# Patient Record
Sex: Female | Born: 1945 | Race: White | Hispanic: No | Smoking: Former smoker
Health system: Southern US, Community
[De-identification: ages and names within clinical notes are randomized; demographics above are authoritative.]

## PROBLEM LIST (undated history)

## (undated) DIAGNOSIS — K589 Irritable bowel syndrome without diarrhea: Secondary | ICD-10-CM

## (undated) DIAGNOSIS — F438 Other reactions to severe stress: Secondary | ICD-10-CM

## (undated) DIAGNOSIS — B350 Tinea barbae and tinea capitis: Secondary | ICD-10-CM

## (undated) DIAGNOSIS — R112 Nausea with vomiting, unspecified: Secondary | ICD-10-CM

## (undated) DIAGNOSIS — G252 Other specified forms of tremor: Secondary | ICD-10-CM

## (undated) DIAGNOSIS — J309 Allergic rhinitis, unspecified: Secondary | ICD-10-CM

## (undated) DIAGNOSIS — M545 Low back pain, unspecified: Secondary | ICD-10-CM

## (undated) DIAGNOSIS — J45909 Unspecified asthma, uncomplicated: Secondary | ICD-10-CM

## (undated) DIAGNOSIS — E782 Mixed hyperlipidemia: Secondary | ICD-10-CM

## (undated) DIAGNOSIS — K760 Fatty (change of) liver, not elsewhere classified: Secondary | ICD-10-CM

## (undated) DIAGNOSIS — J984 Other disorders of lung: Secondary | ICD-10-CM

## (undated) DIAGNOSIS — Z9889 Other specified postprocedural states: Secondary | ICD-10-CM

## (undated) DIAGNOSIS — F4389 Other reactions to severe stress: Secondary | ICD-10-CM

## (undated) DIAGNOSIS — I10 Essential (primary) hypertension: Secondary | ICD-10-CM

## (undated) DIAGNOSIS — E559 Vitamin D deficiency, unspecified: Secondary | ICD-10-CM

## (undated) DIAGNOSIS — G250 Essential tremor: Secondary | ICD-10-CM

## (undated) DIAGNOSIS — K5790 Diverticulosis of intestine, part unspecified, without perforation or abscess without bleeding: Secondary | ICD-10-CM

## (undated) DIAGNOSIS — E119 Type 2 diabetes mellitus without complications: Secondary | ICD-10-CM

## (undated) DIAGNOSIS — K219 Gastro-esophageal reflux disease without esophagitis: Secondary | ICD-10-CM

## (undated) DIAGNOSIS — E041 Nontoxic single thyroid nodule: Secondary | ICD-10-CM

## (undated) HISTORY — DX: Other specified forms of tremor: G25.2

## (undated) HISTORY — PX: TONSILLECTOMY: SUR1361

## (undated) HISTORY — DX: Diverticulosis of intestine, part unspecified, without perforation or abscess without bleeding: K57.90

## (undated) HISTORY — DX: Vitamin D deficiency, unspecified: E55.9

## (undated) HISTORY — PX: ABDOMINAL HYSTERECTOMY: SHX81

## (undated) HISTORY — DX: Unspecified asthma, uncomplicated: J45.909

## (undated) HISTORY — DX: Mixed hyperlipidemia: E78.2

## (undated) HISTORY — DX: Allergic rhinitis, unspecified: J30.9

## (undated) HISTORY — DX: Other reactions to severe stress: F43.89

## (undated) HISTORY — DX: Other disorders of lung: J98.4

## (undated) HISTORY — DX: Essential (primary) hypertension: I10

## (undated) HISTORY — DX: Irritable bowel syndrome, unspecified: K58.9

## (undated) HISTORY — DX: Low back pain, unspecified: M54.50

## (undated) HISTORY — PX: COLONOSCOPY: SHX5424

## (undated) HISTORY — DX: Tinea barbae and tinea capitis: B35.0

## (undated) HISTORY — DX: Essential tremor: G25.0

## (undated) HISTORY — DX: Essential tremor: G25.2

## (undated) HISTORY — DX: Other reactions to severe stress: F43.8

## (undated) HISTORY — DX: Low back pain: M54.5

## (undated) HISTORY — PX: LAPAROSCOPIC UNILATERAL SALPINGO OOPHERECTOMY: SHX5935

---
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
## 1984-05-17 HISTORY — PX: TUBAL LIGATION: SHX77

## 2000-05-17 HISTORY — PX: LAPAROSCOPIC TOTAL HYSTERECTOMY: SUR800

## 2004-05-17 LAB — HM COLONOSCOPY

## 2004-07-14 ENCOUNTER — Encounter: Payer: Self-pay | Admitting: Family Medicine

## 2004-09-23 ENCOUNTER — Ambulatory Visit: Payer: Self-pay | Admitting: Internal Medicine

## 2004-10-20 ENCOUNTER — Ambulatory Visit: Payer: Self-pay | Admitting: Internal Medicine

## 2004-11-13 ENCOUNTER — Encounter (INDEPENDENT_AMBULATORY_CARE_PROVIDER_SITE_OTHER): Payer: Self-pay | Admitting: Specialist

## 2004-11-13 ENCOUNTER — Ambulatory Visit: Payer: Self-pay | Admitting: Internal Medicine

## 2004-12-07 ENCOUNTER — Ambulatory Visit: Payer: Self-pay | Admitting: Internal Medicine

## 2006-11-21 ENCOUNTER — Ambulatory Visit: Payer: Self-pay | Admitting: Family Medicine

## 2006-12-05 ENCOUNTER — Ambulatory Visit: Payer: Self-pay | Admitting: Family Medicine

## 2006-12-19 ENCOUNTER — Encounter: Payer: Self-pay | Admitting: Family Medicine

## 2006-12-21 ENCOUNTER — Inpatient Hospital Stay: Payer: Self-pay | Admitting: Internal Medicine

## 2006-12-21 ENCOUNTER — Encounter: Payer: Self-pay | Admitting: Family Medicine

## 2007-02-06 ENCOUNTER — Ambulatory Visit: Payer: Self-pay | Admitting: Family Medicine

## 2007-02-06 DIAGNOSIS — E782 Mixed hyperlipidemia: Secondary | ICD-10-CM | POA: Insufficient documentation

## 2007-02-06 DIAGNOSIS — J309 Allergic rhinitis, unspecified: Secondary | ICD-10-CM | POA: Insufficient documentation

## 2007-02-06 DIAGNOSIS — M545 Low back pain, unspecified: Secondary | ICD-10-CM | POA: Insufficient documentation

## 2007-02-06 DIAGNOSIS — J4530 Mild persistent asthma, uncomplicated: Secondary | ICD-10-CM | POA: Insufficient documentation

## 2007-02-06 DIAGNOSIS — K589 Irritable bowel syndrome without diarrhea: Secondary | ICD-10-CM | POA: Insufficient documentation

## 2007-02-08 ENCOUNTER — Encounter: Payer: Self-pay | Admitting: Family Medicine

## 2007-02-16 ENCOUNTER — Encounter: Payer: Self-pay | Admitting: Family Medicine

## 2007-02-16 LAB — CONVERTED CEMR LAB
Cholesterol: 212 mg/dL
HDL: 38 mg/dL
Triglycerides: 136 mg/dL

## 2007-04-17 ENCOUNTER — Ambulatory Visit: Payer: Self-pay | Admitting: Family Medicine

## 2007-04-18 ENCOUNTER — Encounter: Payer: Self-pay | Admitting: Family Medicine

## 2007-04-19 ENCOUNTER — Ambulatory Visit: Payer: Self-pay | Admitting: Family Medicine

## 2007-04-19 LAB — CONVERTED CEMR LAB
HDL goal, serum: 40 mg/dL
LDL Goal: 130 mg/dL

## 2007-04-21 LAB — CONVERTED CEMR LAB
ALT: 45 U/L — ABNORMAL HIGH
AST: 40 U/L — ABNORMAL HIGH
Albumin: 4.1 g/dL
Alkaline Phosphatase: 150 U/L — ABNORMAL HIGH
BUN: 10 mg/dL
CO2: 25 meq/L
Calcium: 9.4 mg/dL
Chloride: 105 meq/L
Cholesterol: 288 mg/dL — ABNORMAL HIGH
Creatinine, Ser: 0.79 mg/dL
Glucose, Bld: 97 mg/dL
HDL: 33 mg/dL — ABNORMAL LOW
LDL Cholesterol: 214 mg/dL — ABNORMAL HIGH
Potassium: 4.1 meq/L
Sodium: 139 meq/L
Total Bilirubin: 0.4 mg/dL
Total CHOL/HDL Ratio: 8.7
Total Protein: 7.7 g/dL
Triglycerides: 206 mg/dL — ABNORMAL HIGH
VLDL: 41 mg/dL — ABNORMAL HIGH

## 2007-04-27 ENCOUNTER — Ambulatory Visit: Payer: Self-pay | Admitting: Family Medicine

## 2007-04-27 ENCOUNTER — Telehealth: Payer: Self-pay | Admitting: Family Medicine

## 2007-04-27 ENCOUNTER — Ambulatory Visit: Payer: Self-pay | Admitting: Cardiology

## 2007-04-28 ENCOUNTER — Ambulatory Visit: Payer: Self-pay | Admitting: Family Medicine

## 2007-04-28 ENCOUNTER — Inpatient Hospital Stay (HOSPITAL_COMMUNITY): Admission: AD | Admit: 2007-04-28 | Discharge: 2007-05-02 | Payer: Self-pay | Admitting: Internal Medicine

## 2007-04-28 ENCOUNTER — Ambulatory Visit: Payer: Self-pay | Admitting: Internal Medicine

## 2007-04-29 ENCOUNTER — Encounter: Payer: Self-pay | Admitting: Family Medicine

## 2007-05-01 ENCOUNTER — Encounter: Payer: Self-pay | Admitting: Family Medicine

## 2007-05-05 ENCOUNTER — Ambulatory Visit: Payer: Self-pay | Admitting: Family Medicine

## 2007-05-08 ENCOUNTER — Telehealth: Payer: Self-pay | Admitting: Family Medicine

## 2007-05-16 ENCOUNTER — Encounter: Payer: Self-pay | Admitting: Family Medicine

## 2007-06-01 ENCOUNTER — Encounter (INDEPENDENT_AMBULATORY_CARE_PROVIDER_SITE_OTHER): Payer: Self-pay | Admitting: Obstetrics & Gynecology

## 2007-06-01 ENCOUNTER — Ambulatory Visit (HOSPITAL_COMMUNITY): Admission: RE | Admit: 2007-06-01 | Discharge: 2007-06-01 | Payer: Self-pay | Admitting: Obstetrics & Gynecology

## 2007-07-19 ENCOUNTER — Ambulatory Visit: Payer: Self-pay | Admitting: Family Medicine

## 2007-07-25 LAB — CONVERTED CEMR LAB
ALT: 28 units/L (ref 0–35)
Cholesterol: 224 mg/dL (ref 0–200)
Direct LDL: 172.9 mg/dL
HDL: 36.6 mg/dL — ABNORMAL LOW (ref >39.0)
Total CHOL/HDL Ratio: 6.1
Total Protein: 6.8 g/dL (ref 6.0–8.3)
Triglycerides: 176 mg/dL — ABNORMAL HIGH (ref 0–149)
VLDL: 35 mg/dL (ref 0–40)

## 2007-08-15 ENCOUNTER — Encounter: Payer: Self-pay | Admitting: Family Medicine

## 2007-10-25 ENCOUNTER — Ambulatory Visit: Payer: Self-pay | Admitting: Family Medicine

## 2007-10-31 LAB — CONVERTED CEMR LAB
HDL: 36.1 mg/dL — ABNORMAL LOW (ref >39.0)
LDL Cholesterol: 109 mg/dL — ABNORMAL HIGH (ref 0–99)
Total CHOL/HDL Ratio: 4.7

## 2008-05-21 ENCOUNTER — Ambulatory Visit: Payer: Self-pay | Admitting: Family Medicine

## 2008-05-23 LAB — CONVERTED CEMR LAB
ALT: 37 units/L — ABNORMAL HIGH (ref 0–35)
VLDL: 37 mg/dL (ref 0–40)

## 2008-06-17 ENCOUNTER — Encounter: Payer: Self-pay | Admitting: Family Medicine

## 2008-07-15 ENCOUNTER — Telehealth: Payer: Self-pay | Admitting: Family Medicine

## 2008-08-08 ENCOUNTER — Ambulatory Visit: Payer: Self-pay | Admitting: Family Medicine

## 2008-08-12 ENCOUNTER — Ambulatory Visit: Payer: Self-pay | Admitting: Family Medicine

## 2008-08-15 LAB — CONVERTED CEMR LAB
ALT: 34 units/L (ref 0–35)
AST: 24 units/L (ref 0–37)
Albumin: 4 g/dL (ref 3.5–5.2)
Alkaline Phosphatase: 124 units/L — ABNORMAL HIGH (ref 39–117)
CO2: 23 meq/L (ref 19–32)
Calcium: 9 mg/dL (ref 8.4–10.5)
Chloride: 105 meq/L (ref 96–112)
Cholesterol: 214 mg/dL — ABNORMAL HIGH (ref 0–200)
Creatinine, Ser: 0.73 mg/dL (ref 0.40–1.20)
Glucose, Bld: 99 mg/dL (ref 70–99)
Sodium: 142 meq/L (ref 135–145)
Total Bilirubin: 0.3 mg/dL (ref 0.3–1.2)
Total CHOL/HDL Ratio: 5.6
Total CK: 30 units/L (ref 7–177)
Total Protein: 7 g/dL (ref 6.0–8.3)
Triglycerides: 192 mg/dL — ABNORMAL HIGH (ref <150)
VLDL: 38 mg/dL (ref 0–40)
Vit D, 25-Hydroxy: 17 ng/mL — ABNORMAL LOW (ref 30–89)

## 2008-09-19 ENCOUNTER — Ambulatory Visit: Payer: Self-pay | Admitting: Family Medicine

## 2008-09-19 DIAGNOSIS — E559 Vitamin D deficiency, unspecified: Secondary | ICD-10-CM | POA: Insufficient documentation

## 2008-09-19 DIAGNOSIS — J984 Other disorders of lung: Secondary | ICD-10-CM | POA: Insufficient documentation

## 2008-09-24 ENCOUNTER — Ambulatory Visit: Payer: Self-pay | Admitting: Cardiology

## 2008-10-01 ENCOUNTER — Encounter: Payer: Self-pay | Admitting: Family Medicine

## 2008-10-01 LAB — HM MAMMOGRAPHY: HM Mammogram: NORMAL

## 2008-10-08 ENCOUNTER — Encounter (INDEPENDENT_AMBULATORY_CARE_PROVIDER_SITE_OTHER): Payer: Self-pay | Admitting: *Deleted

## 2008-11-12 ENCOUNTER — Ambulatory Visit: Payer: Self-pay | Admitting: Family Medicine

## 2008-11-15 LAB — CONVERTED CEMR LAB
Cholesterol: 198 mg/dL (ref 0–200)
HDL: 35 mg/dL — ABNORMAL LOW (ref >39)
Triglycerides: 186 mg/dL — ABNORMAL HIGH (ref <150)

## 2008-11-28 ENCOUNTER — Telehealth: Payer: Self-pay | Admitting: Family Medicine

## 2008-12-11 ENCOUNTER — Ambulatory Visit: Payer: Self-pay | Admitting: Family Medicine

## 2009-01-08 ENCOUNTER — Telehealth: Payer: Self-pay | Admitting: Family Medicine

## 2009-03-06 ENCOUNTER — Ambulatory Visit: Payer: Self-pay | Admitting: Family Medicine

## 2009-03-06 DIAGNOSIS — F438 Other reactions to severe stress: Secondary | ICD-10-CM

## 2009-03-06 DIAGNOSIS — F418 Other specified anxiety disorders: Secondary | ICD-10-CM | POA: Insufficient documentation

## 2009-05-22 ENCOUNTER — Ambulatory Visit: Payer: Self-pay | Admitting: Family Medicine

## 2009-05-23 LAB — CONVERTED CEMR LAB
Alkaline Phosphatase: 127 units/L — ABNORMAL HIGH (ref 39–117)
BUN: 8 mg/dL (ref 6–23)
Cholesterol: 211 mg/dL — ABNORMAL HIGH (ref 0–200)
Creatinine, Ser: 0.64 mg/dL (ref 0.40–1.20)
HDL: 43 mg/dL (ref >39)
LDL Cholesterol: 137 mg/dL — ABNORMAL HIGH (ref 0–99)
Total Bilirubin: 0.3 mg/dL (ref 0.3–1.2)
Total CHOL/HDL Ratio: 4.9
VLDL: 31 mg/dL (ref 0–40)
Vit D, 25-Hydroxy: 21 ng/mL — ABNORMAL LOW (ref 30–89)

## 2009-06-14 IMAGING — CT CT CHEST W/ CM
1 series · 15 of 31 positions shown, 19 images · non-contrast
Comparison: none

REASON FOR EXAM: Left upper lobe density
COMMENTS:

[Series 2: soft tissue · axial · 0.62mm/px · z∈[+142,+432]mm · 15 of 64 slices shown, 19 images]
[im 3/64  mediastinal]
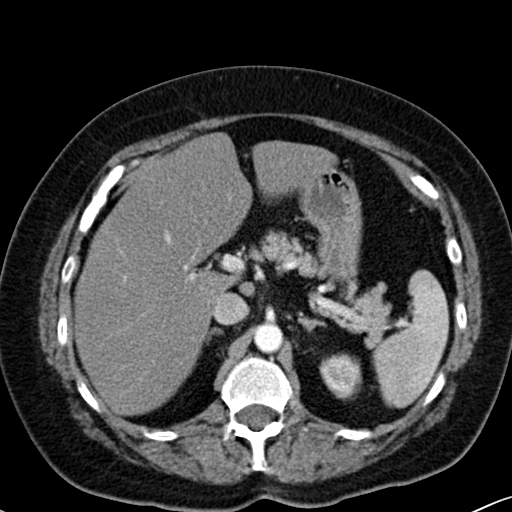
[im 3/64  lung]
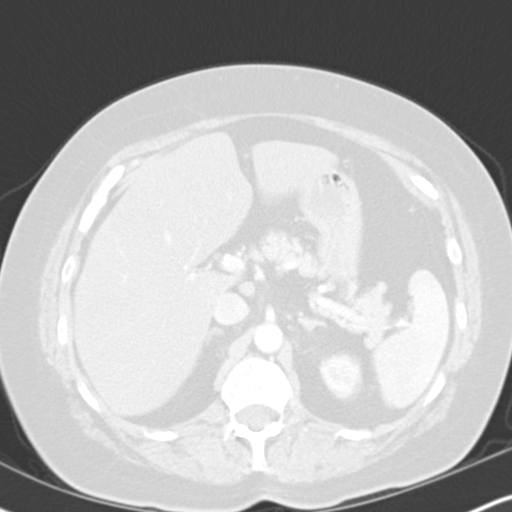
[im 8/64  lung]
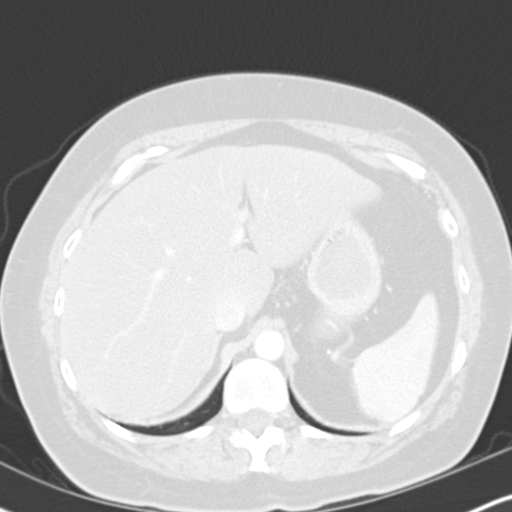
[im 12/64  lung]
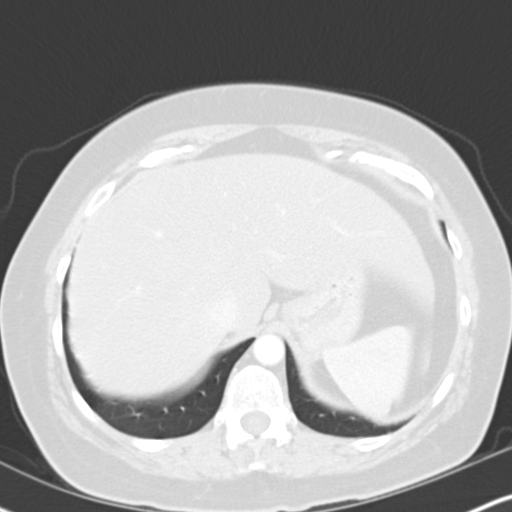
[im 15/64  lung]
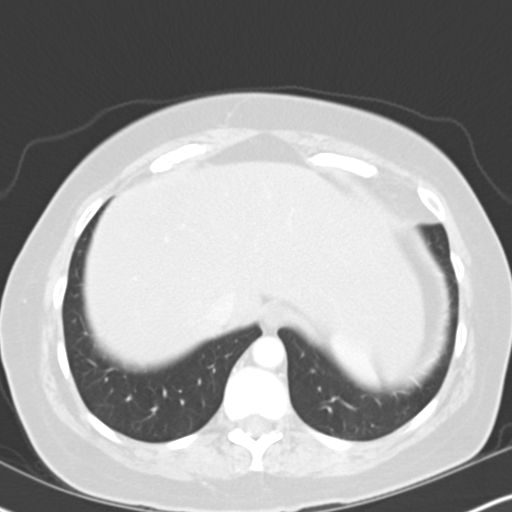
[im 19/64  mediastinal]
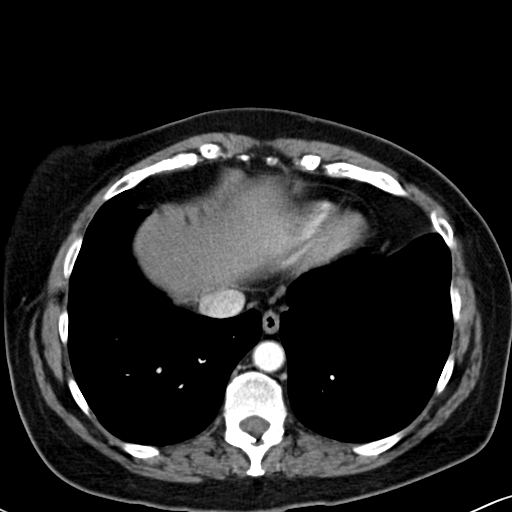
[im 19/64  lung]
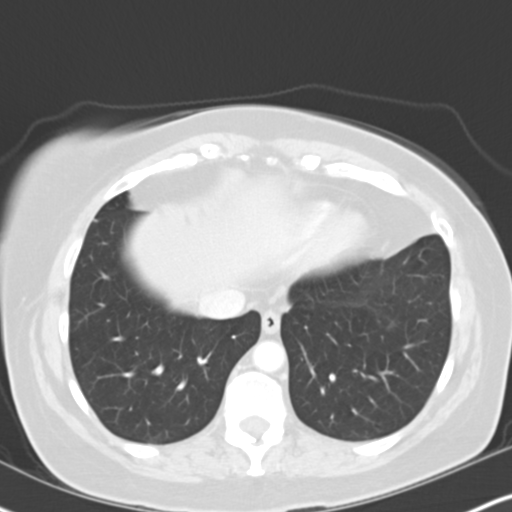
[im 24/64  lung]
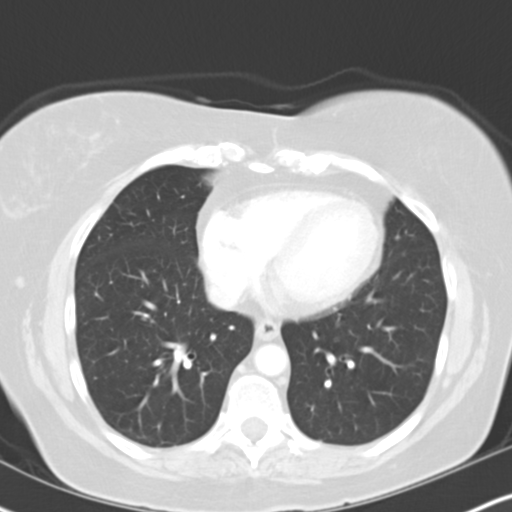
[im 29/64  lung]
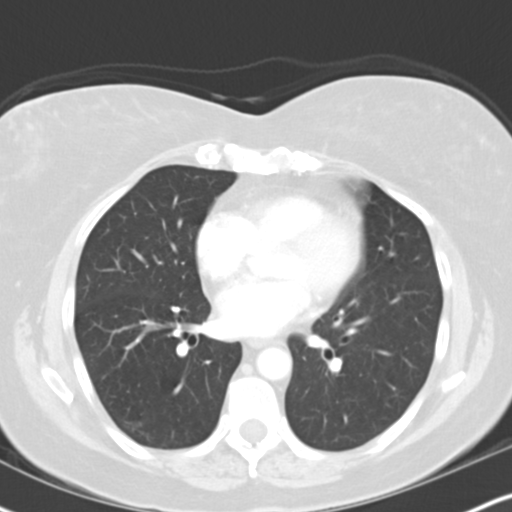
[im 33/64  lung]
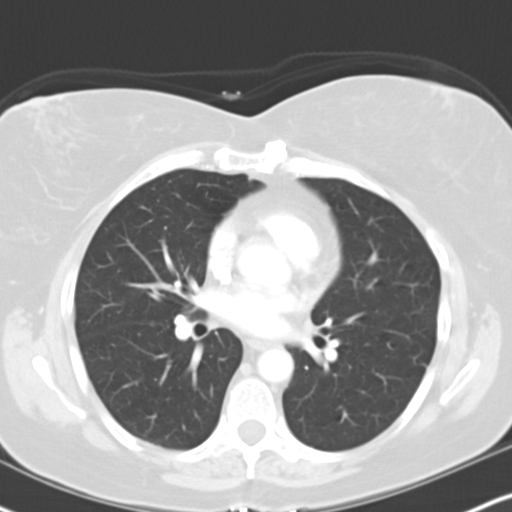
[im 36/64  mediastinal]
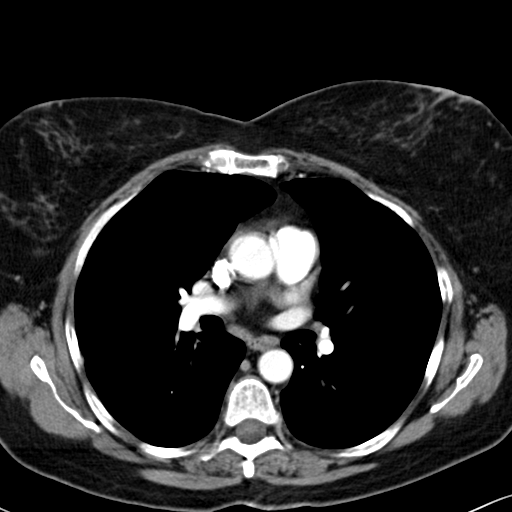
[im 36/64  lung]
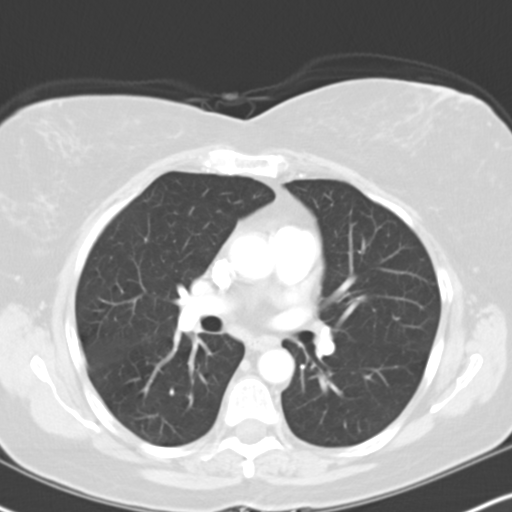
[im 40/64  lung]
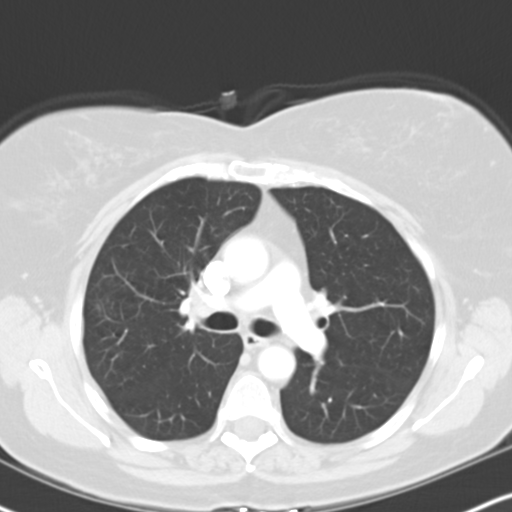
[im 45/64  lung]
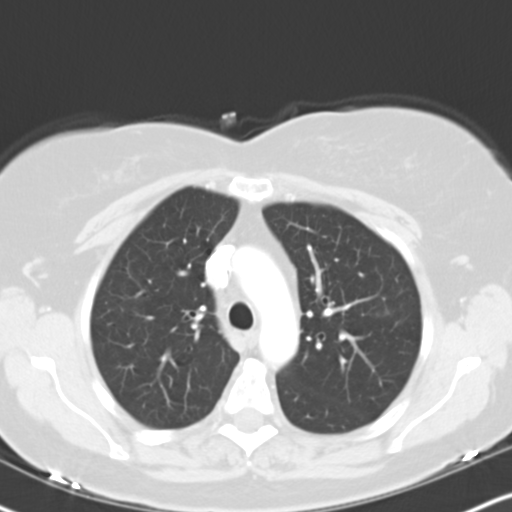
[im 50/64  lung]
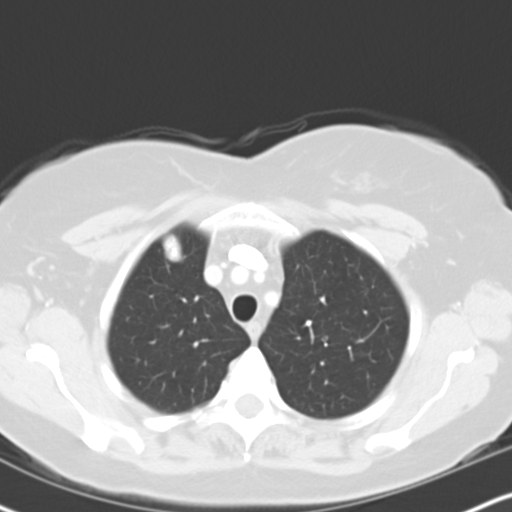
[im 52/64  mediastinal]
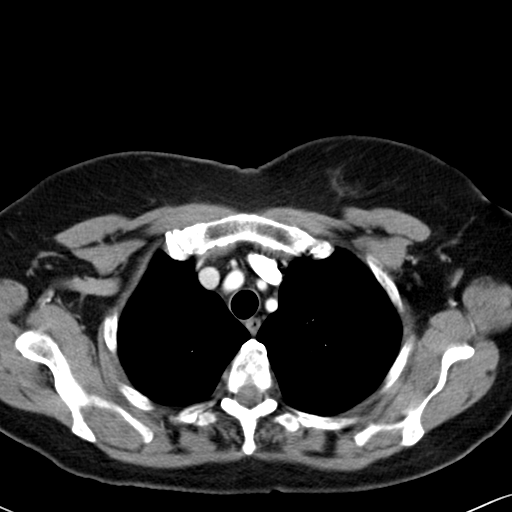
[im 52/64  lung]
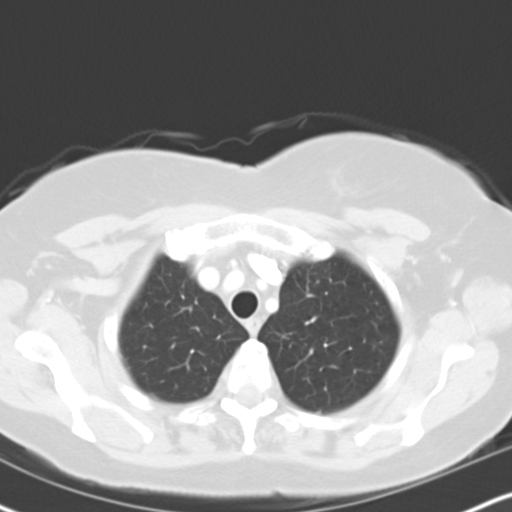
[im 57/64  lung]
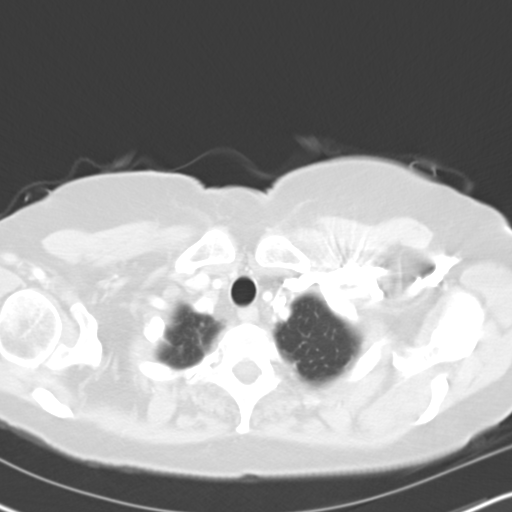
[im 61/64  lung]
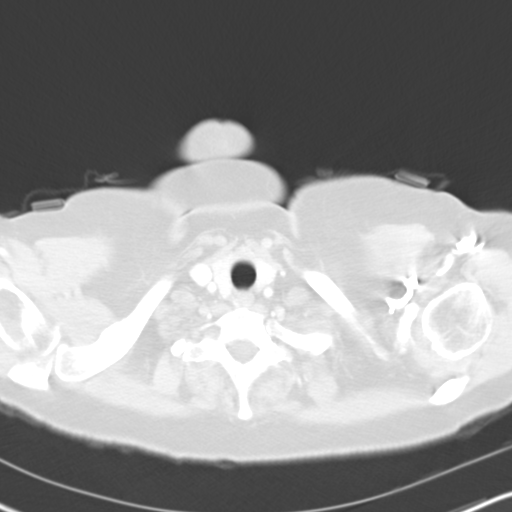

[15 of 31 positions shown; findings below may reference images not displayed]

PROCEDURE:     CT  - CT CHEST WITH CONTRAST  - December 05, 2006  [DATE]

RESULT:     Helical, 5.0 mm sections were obtained from the thoracic inlet
through the lung bases status post intravenous administration of 70 ml of
Msovue-QSP.

Evaluation of the mediastinum and hilar regions and structures demonstrates
no evidence of mediastinal or hilar adenopathy or masses. The lung
parenchyma demonstrates a small, 4.0 mm, peripheral, nodular density within
the posterior lateral aspect of the medial segment of the RIGHT middle lobe.
This is appreciated on the lung windows without evidence of a soft tissue
component. No further pulmonary nodules or masses are appreciated.
Evaluation of the LEFT upper lobe demonstrates no evidence of pulmonary
nodules, masses, infiltrates, effusions or edema. The visualized upper
abdominal viscera demonstrate no gross abnormalities.
IMPRESSION: There is a 4.0 mm nodular density in the region of the
RIGHT middle lobe periphery, appreciated on the lung windows on image #40.
Surveillance evaluation in 4-6 months can be obtained, if and as clinically
warranted.

## 2009-07-17 ENCOUNTER — Ambulatory Visit: Payer: Self-pay | Admitting: Family Medicine

## 2009-07-17 ENCOUNTER — Telehealth: Payer: Self-pay | Admitting: Family Medicine

## 2009-07-17 DIAGNOSIS — I10 Essential (primary) hypertension: Secondary | ICD-10-CM | POA: Insufficient documentation

## 2009-08-22 ENCOUNTER — Ambulatory Visit: Payer: Self-pay | Admitting: Family Medicine

## 2009-08-26 ENCOUNTER — Ambulatory Visit: Payer: Self-pay | Admitting: Family Medicine

## 2009-08-26 LAB — CONVERTED CEMR LAB
Cholesterol: 177 mg/dL (ref 0–200)
HDL: 38.2 mg/dL — ABNORMAL LOW (ref >39.00)
LDL Cholesterol: 108 mg/dL — ABNORMAL HIGH (ref 0–99)
Total CHOL/HDL Ratio: 5
Triglycerides: 156 mg/dL — ABNORMAL HIGH (ref 0.0–149.0)
VLDL: 31.2 mg/dL (ref 0.0–40.0)

## 2009-08-27 LAB — CONVERTED CEMR LAB: Vit D, 25-Hydroxy: 31 ng/mL (ref 30–89)

## 2009-09-23 ENCOUNTER — Ambulatory Visit: Payer: Self-pay | Admitting: Family Medicine

## 2009-11-25 ENCOUNTER — Ambulatory Visit: Payer: Self-pay | Admitting: Family Medicine

## 2009-11-25 LAB — CONVERTED CEMR LAB
Bilirubin Urine: NEGATIVE
Blood in Urine, dipstick: NEGATIVE
Ketones, urine, test strip: NEGATIVE
Nitrite: NEGATIVE
pH: 5

## 2009-12-11 ENCOUNTER — Ambulatory Visit: Payer: Self-pay | Admitting: Family Medicine

## 2009-12-15 LAB — CONVERTED CEMR LAB
Calcium: 8.4 mg/dL (ref 8.4–10.5)
Creatinine, Ser: 0.7 mg/dL (ref 0.4–1.2)
GFR calc non Af Amer: 88.66 mL/min (ref >60)
Glucose, Bld: 104 mg/dL — ABNORMAL HIGH (ref 70–99)

## 2010-05-19 ENCOUNTER — Telehealth: Payer: Self-pay | Admitting: Family Medicine

## 2010-05-26 ENCOUNTER — Ambulatory Visit
Admission: RE | Admit: 2010-05-26 | Discharge: 2010-05-26 | Payer: Self-pay | Source: Home / Self Care | Attending: Family Medicine | Admitting: Family Medicine

## 2010-06-14 LAB — CONVERTED CEMR LAB
ALT: 32 units/L (ref 0–35)
AST: 22 units/L (ref 0–37)
Alkaline Phosphatase: 144 units/L — ABNORMAL HIGH (ref 39–117)
Basophils Relative: 0 % (ref 0–1)
Bilirubin Urine: NEGATIVE
CO2: 22 meq/L (ref 19–32)
Calcium: 9.2 mg/dL (ref 8.4–10.5)
Glucose, Bld: 138 mg/dL — ABNORMAL HIGH (ref 70–99)
Glucose, Urine, Semiquant: NEGATIVE
Hemoglobin: 13.8 g/dL (ref 12.0–15.0)
Lymphocytes Relative: 7 % — ABNORMAL LOW (ref 12–46)
MCHC: 32.8 g/dL (ref 30.0–36.0)
Monocytes Absolute: 0.8 10*3/uL (ref 0.1–1.0)
Monocytes Relative: 6 % (ref 3–12)
Nitrite: NEGATIVE
Platelets: 255 10*3/uL (ref 150–400)
RBC: 5.01 M/uL (ref 3.87–5.11)
RDW: 13.6 % (ref 11.5–15.5)
Sodium: 136 meq/L (ref 135–145)
Total Protein: 8.4 g/dL — ABNORMAL HIGH (ref 6.0–8.3)
Urobilinogen, UA: NEGATIVE
WBC Urine, dipstick: NEGATIVE
WBC: 14.7 10*3/uL — ABNORMAL HIGH (ref 4.0–10.5)
pH: 6

## 2010-06-16 NOTE — Assessment & Plan Note (Signed)
Summary: 1 MONTH FOLLO WUP/RBH   Vital Signs:  Patient profile:   65 year old female Height:      65.5 inches Weight:      159.6 pounds BMI:     26.25 Temp:     98.2 degrees F oral Pulse rate:   80 / minute Pulse rhythm:   regular BP sitting:   150 / 80  (left arm) Cuff size:   regular  Vitals Entered By: Benny Lennert CMA Duncan Dull) (August 22, 2009 3:47 PM)  History of Present Illness: Chief complaint 1 month follow up  HTN, restarted on BP med HCTZ  with Dr. Patsy Lager 1 month ago.  EKG was stable. Here today for 1 month recheck.   She states she ran out.. but has been off for a week. Not checking at home. She is interested in changing to different BP med given she believes it is causing constipation  Dizzyness and vertigo.Marland Kitchen BPPV resolved.   Tremor worsening...wants to try bblocker again.  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) 6)  Special Screening For Osteoporosis  (ICD-V82.81) 7)  Unspecified Vitamin D Deficiency  (ICD-268.9) 8)  Shoulder Impingement Syndrome, Left  (ICD-726.2) 9)  Myalgia  (ICD-729.1) 10)  Abdominal Pain, Left Lower Quadrant  (ICD-789.04) 11)  Fever Unspecified  (ICD-780.60) 12)  Cough  (ICD-786.2) 13)  Screening For Mlig Neop, Breast, Nos  (ICD-V76.10) 14)  Elevated Bp Reading Without Dx Hypertension  (ICD-796.2) 15)  Onychomycosis, Toenails  (ICD-110.1) 16)  Hyperlipidemia  (ICD-272.2) 17)  Low Back Pain, Chronic  (ICD-724.2) 18)  Irritable Bowel Syndrome  (ICD-564.1) 19)  Tremor, Essential  (ICD-333.1) 20)  Allergic Rhinitis  (ICD-477.9) 21)  Asthma  (ICD-493.90)  Current Medications (verified): 1)  Simvastatin 40 Mg  Tabs (Simvastatin) .... Take 1 Tablet By Mouth Once A Day 2)  Estradiol 0.5 Mg  Tabs (Estradiol) .... Take 1 Tablet By Mouth Once A Day 3)  Singulair 10 Mg  Tabs (Montelukast  Sodium) .... Take 1 Tab By Mouth At Bedtime 4)  Claritin 10 Mg Tabs (Loratadine) .... Take 1 Tablet By Mouth Once A Day As Needed 5)  Align  Caps (Probiotic Product) .... Take 1 Tablet By Mouth Once A Day 6)  Alprazolam 0.25 Mg Tabs (Alprazolam) .... Take 1 Tablet By Mouth Once A Day As Needed Anxiety 7)  Vitamin D (Ergocalciferol) 50000 Unit Caps (Ergocalciferol) .... Take 1 Tablet Once A Week On The Same Day Each Week 8)  Meclizine Hcl 25 Mg Tabs (Meclizine Hcl) .Marland Kitchen.. 1 By Mouth 4 Times Daily As Needed Vertigo 9)  Diazepam 5 Mg Tabs (Diazepam) .Marland Kitchen.. 1 By Mouth Q 12 Hours As Needed Vertigo 10)  Metoprolol Succinate 25 Mg Xr24h-Tab (Metoprolol Succinate) .Marland Kitchen.. 1 Tab By Mouth Daily  Allergies: 1)  ! Codeine  Past History:  Past medical, surgical, family and social histories (including risk factors) reviewed, and no changes noted (except as noted below).  Past Medical History: Reviewed history from 07/17/2009 and no changes required. ONYCHOMYCOSIS, TOENAILS (ICD-110.1) HYPERLIPIDEMIA (ICD-272.2) LOW BACK PAIN, CHRONIC (ICD-724.2) IRRITABLE BOWEL SYNDROME (ICD-564.1) TREMOR, ESSENTIAL (ICD-333.1) ALLERGIC RHINITIS (ICD-477.9) ASTHMA (ICD-493.90) Hypertension  Past Surgical History: Reviewed history from 04/28/2007 and no changes required. PFTs nml 7/08 Chest CT: 7/08: spot on left lung hospitalized 7/08 for fever unknown origin found to be sphenoid sinusitis and UTI  hysterectomy , total 2002 1986 BTL Tonsillectomy 1967/68 MRI of back: bulging disc  Family History: Reviewed history from 02/06/2007 and no changes required. father age 50 trigeminal neuralgia mother died 51 CVA, tremor, DM, CHF sister and 3 brothers: DM, CAD, IBS, tremor aunts and cousin with breast cancer MGF: colon cancer no MI < 61  Social History: Reviewed history from 02/06/2007 and no changes required. 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 CV:  Denies chest pain or discomfort. Resp:  Denies shortness of breath. GI:  Denies abdominal pain.  Physical Exam  General:  Well-developed,well-nourished,in no acute distress; alert,appropriate and cooperative throughout examination Mouth:  Oral mucosa and oropharynx without lesions or exudates.  Teeth in good repair. Neck:  no carotid bruit or thyromegaly no cervical or supraclavicular lymphadenopathy  Lungs:  Normal respiratory effort, chest expands symmetrically. Lungs are clear to auscultation, no crackles or wheezes. Heart:  Normal rate and regular rhythm. S1 and S2 normal without gallop, murmur, click, rub or other extra sounds. Pulses:  R and L posterior tibial pulses are full and equal bilaterally  Extremities:  no edema  Neurologic:  No cranial nerve deficits noted. Station and gait are normal. Plantar reflexes are down-going bilaterally. DTRs are symmetrical throughout. Sensory, motor and coordinative functions appear intact.   Impression & Recommendations:  Problem # 1:  HYPERTENSION (ICD-401.9) Will change HCTZ to BBlocker..likely will need to increase, but will increase slowly given SE in past.  The following medications were removed from the medication list:    Hydrochlorothiazide 12.5 Mg Tabs (Hydrochlorothiazide) .Marland Kitchen... Take 1 tab  by mouth every morning Her updated medication list for this problem includes:    Metoprolol Succinate 25 Mg Xr24h-tab (Metoprolol succinate) .Marland Kitchen... 1 tab by mouth daily  BP today: 150/80 Prior BP: 142/84 (07/17/2009)  Prior 10 Yr Risk Heart Disease: 15 % (04/19/2007)  Labs Reviewed: K+: 4.0 (05/22/2009) Creat: : 0.64 (05/22/2009)   Chol: 211 (05/22/2009)   HDL: 43 (05/22/2009)   LDL: 137 (05/22/2009)   TG: 155 (05/22/2009)  Problem # 2:  TREMOR, ESSENTIAL (ICD-333.1) Treat with BB;locker as above.    Complete Medication List: 1)  Simvastatin 40 Mg Tabs (Simvastatin) .... Take 1 tablet by mouth once a day 2)  Estradiol 0.5 Mg Tabs (Estradiol) .... Take 1 tablet by mouth once a day 3)  Singulair 10 Mg Tabs (Montelukast sodium) .... Take 1 tab by mouth at bedtime 4)  Claritin 10 Mg Tabs (Loratadine) .... Take 1 tablet by mouth once a day as needed 5)  Align Caps (Probiotic product) .... Take 1 tablet by mouth once a day 6)  Alprazolam 0.25 Mg Tabs (Alprazolam) .... Take 1 tablet by mouth once a day as needed anxiety 7)  Vitamin D (ergocalciferol) 50000 Unit Caps (Ergocalciferol) .... Take 1 tablet once a week on the same day each week 8)  Meclizine Hcl 25 Mg Tabs (Meclizine hcl) .Marland Kitchen.. 1 by mouth 4 times daily as needed vertigo 9)  Diazepam 5 Mg Tabs (Diazepam) .Marland Kitchen.. 1 by mouth q 12 hours as needed vertigo 10)  Metoprolol Succinate 25 Mg Xr24h-tab (Metoprolol succinate) .Marland Kitchen.. 1 tab by mouth daily  Patient Instructions: 1)  Follow BP at home.Marland Kitchengoal <140/90. 2)  Please schedule a follow-up appointment in 1 month HTN.  Prescriptions: METOPROLOL SUCCINATE 25 MG XR24H-TAB (METOPROLOL SUCCINATE) 1 tab by  mouth daily  #30 x 11   Entered and Authorized by:   Kerby Nora MD   Signed by:   Kerby Nora MD on 08/22/2009   Method used:   Electronically to        [id number]       [address]       [place], [place]  [address]       Ph: [phone]       Fax: [phone]   RxID:   [id number]   Current Allergies (reviewed today): ! CODEINE

## 2010-06-16 NOTE — Assessment & Plan Note (Signed)
Summary: YEARLY PHYSICAL/JRR   Vital Signs:  Patient profile:   65 year old female Height:      65.5 inches Weight:      161.6 pounds BMI:     26.58 Temp:     98.3 degrees F oral Pulse rate:   80 / minute Pulse rhythm:   regular BP sitting:   130 / 78  (left arm) Cuff size:   regular  Vitals Entered By: Benny Lennert CMA Duncan Dull) (November 25, 2009 2:43 PM)  History of Present Illness: Chief complaint cpx  Tremor...minimal improvement in 25 mg..and having significant fatgue. She wishes to stop the medications. No t interested in   HTN..better controlled on metoprolol but fluctuates 15/1/80-120-70s.  HCTZ in past caused constipation.  Has been walking on treadmill...noted swelling, mild pain  overdorsal foot in left foot.  Improved some.  Has history of stress fracture in left foot lower sown.  Stopped walking. Podiatrist... Dr. Al Corpus.  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 Take 1 Tablet By Mouth Once A Day 2)  Estradiol 0.5 Mg  Tabs (Estradiol) .... Take 1 Tablet By Mouth Once A Day 3)  Singulair 10 Mg  Tabs (Montelukast Sodium) .... Take 1 Tab By Mouth At Bedtime 4)  Claritin 10 Mg Tabs (Loratadine) .... Take 1 Tablet By Mouth Once A Day As Needed 5)  Vitamin D 400 Unit Caps (Cholecalciferol) .... Take  1 Tablet 2 Times Daily 6)  Metoprolol Succinate 25 Mg Xr24h-Tab (Metoprolol Succinate) .... Take 2 Tablets By Mouth Once Daily  Allergies: 1)  ! Codeine  Past History:  Past medical, surgical, family and social histories (including risk factors) reviewed, and no changes noted (except as noted below).  Past Medical History: Reviewed history from 07/17/2009 and no changes required. ONYCHOMYCOSIS, TOENAILS (ICD-110.1) HYPERLIPIDEMIA (ICD-272.2) LOW BACK PAIN, CHRONIC (ICD-724.2) IRRITABLE BOWEL SYNDROME (ICD-564.1) TREMOR, ESSENTIAL (ICD-333.1) ALLERGIC RHINITIS (ICD-477.9) ASTHMA (ICD-493.90) Hypertension  Past Surgical History: Reviewed history from 04/28/2007 and no changes required. PFTs nml 7/08 Chest CT: 7/08: spot on left lung hospitalized 7/08 for fever unknown origin found to be sphenoid sinusitis and UTI hysterectomy , total 2002 1986 BTL Tonsillectomy 1967/68 MRI of back: bulging disc  Family History: Reviewed history from 02/06/2007 and no changes required. father age 84 trigeminal neuralgia mother died 60 CVA, tremor, DM, CHF sister and 3 brothers: DM, CAD, IBS, tremor aunts and cousin with breast cancer MGF: colon cancer no MI < 17  Social History: Reviewed history from 02/06/2007 and no changes required. 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 CV:  Complains of chest pain or discomfort; mild intermittant nonexertional chest pain... last EKG several months ago nml.Marland Kitchen Resp:  Denies  shortness of breath. GI:  Denies abdominal pain, bloody stools, change in bowel habits, constipation, and diarrhea. GU:  Denies dysuria; some urgency.Marland Kitchenempties well but feels like she has to go again...ongoing x 3 weeks. . Derm:  Denies lesion(s) and rash; sees derm.. several areas  frozen. Psych:  Denies anxiety and depression.  Physical Exam  General:  Central obesity, pleasant female in NAD Eyes:  No corneal or conjunctival inflammation noted. EOMI. Perrla. Funduscopic exam benign, without hemorrhages, exudates or papilledema. Vision grossly normal. Ears:  External ear exam shows no significant lesions or deformities.  Otoscopic examination reveals clear canals, tympanic membranes are intact bilaterally without bulging, retraction, inflammation or discharge. Hearing is grossly normal bilaterally. Nose:  External nasal examination shows no deformity or inflammation. Nasal mucosa are pink and moist without lesions or exudates. Mouth:  Oral mucosa and oropharynx without lesions or exudates.  Teeth in good repair. Neck:  no carotid bruit or thyromegaly no cervical or supraclavicular lymphadenopathy  Chest Wall:  No deformities, masses, or tenderness noted. Breasts:  No mass, nodules, thickening, tenderness, bulging, retraction, inflamation, nipple discharge or skin changes noted.   Lungs:  Normal respiratory effort, chest expands symmetrically. Lungs are clear to auscultation, no crackles or wheezes. Heart:  Normal rate and regular rhythm. S1 and S2 normal without gallop, murmur, click, rub or other extra sounds. Abdomen:  Bowel sounds positive,abdomen soft and non-tender without masses, organomegaly or hernias noted. Genitalia:  not indicated Msk:  No deformity or scoliosis noted of thoracic or lumbar spine.   Pulses:  R and L posterior tibial pulses are full and equal bilaterally  Extremities:  no edema Skin:  Intact without suspicious lesions or rashes Psych:  Cognition and judgment appear intact. Alert and cooperative with normal attention span and concentration. No apparent delusions, illusions, hallucinations   Impression & Recommendations:  Problem # 1:  Preventive Health Care (ICD-V70.0) The patient's preventative maintenance and recommended screening tests for  an annual wellness exam were reviewed in full today. Brought up to date unless services declined.  Counselled on the importance of diet, exercise, and its role in overall health and mortality. The patient's FH and SH was reviewed, including their home life, tobacco status, and drug and alcohol status.    NO PAP or DVE due to full hysterectomy, and asymptomatic.   Problem # 2:  URINARY URGENCY (ZOX-096.04)  Orders: UA Dipstick W/ Micro (manual) (54098)  Problem # 3:  HYPERTENSION (ICD-401.9) Poor control. SE to metoprolol. Stop and change to lisinopril 10 mg daily... check Cr in 7-10 days. Her updated medication list for this problem includes:    Metoprolol Succinate 25 Mg Xr24h-tab (Metoprolol succinate) .Marland Kitchen... Take 2 tablets by mouth once daily    Lisinopril 10 Mg Tabs (Lisinopril) .Marland Kitchen... Take 1 tablet by mouth once a day  Problem # 4:  HYPERLIPIDEMIA (ICD-272.2) Well controlled. Continue current medication.  Her updated medication list for this problem includes:    Simvastatin 40 Mg Tabs (Simvastatin) .Marland Kitchen... Take 1 tablet by mouth once a day  Complete Medication List: 1)  Simvastatin 40 Mg Tabs (Simvastatin) .... Take 1 tablet by mouth once a day 2)  Estradiol 0.5 Mg Tabs (Estradiol) .... Take 1 tablet by mouth once a day 3)  Singulair 10 Mg Tabs (Montelukast sodium) .... Take 1 tab by mouth at bedtime 4)  Claritin 10 Mg Tabs (Loratadine) .... Take 1 tablet by mouth once a day as needed 5)  Vitamin D 400 Unit Caps (Cholecalciferol) .Marland KitchenMarland KitchenMarland Kitchen  Take 1 tablet 2 times daily 6)  Metoprolol Succinate 25 Mg Xr24h-tab (Metoprolol succinate) .... Take 2 tablets by mouth once daily 7)  Lisinopril 10 Mg Tabs (Lisinopril) .... Take 1 tablet by mouth once a day  Other Orders: Radiology Referral (Radiology)  Patient Instructions: 1)  Stop metoprolol and start lisinopril. 2)  BMET 7-10 days after starting lisinopril Dx 401.1 NON fasting. 3)  Referral Appointment Information 4)  Day/Date: 5)   Time: 6)  Place/MD: 7)  Address: 8)  Phone/Fax: 9)  Patient given appointment information. Information/Orders faxed/mailed.  Prescriptions: LISINOPRIL 10 MG TABS (LISINOPRIL) Take 1 tablet by mouth once a day  #30 x 11   Entered and Authorized by:   Kerby Nora MD   Signed by:   Kerby Nora MD on 11/25/2009   Method used:   Electronically to        [id number]       [address]       [place], [place]  [address]       Ph: [phone]       Fax: [phone]   RxID:   [id number]   Current Allergies (reviewed today): ! CODEINE  Last Flu Vaccine:  refused (04/19/2007 2:38:04 PM) Flu Vaccine Next Due:  Refused Last TD:  refused (04/19/2007 2:38:04 PM) TD Next Due:  Refused Herpes Zoster Next Due:  Refused    Past Medical History:    Reviewed history from 07/17/2009 and no changes required:       ONYCHOMYCOSIS, TOENAILS (ICD-110.1)       HYPERLIPIDEMIA (ICD-272.2)       LOW BACK PAIN, CHRONIC (ICD-724.2)       IRRITABLE BOWEL SYNDROME (ICD-564.1)       TREMOR, ESSENTIAL (ICD-333.1)       ALLERGIC RHINITIS (ICD-477.9)       ASTHMA (ICD-493.90)       Hypertension  Past Surgical History:    Reviewed history from 04/28/2007 and no changes required:       PFTs nml 7/08       Chest CT: 7/08: spot on left lung       hospitalized 7/08 for fever unknown origin found to be sphenoid sinusitis and UTI       hysterectomy , total 2002       1986 BTL       Tonsillectomy 1967/68       MRI of back: bulging disc        Laboratory Results   Urine Tests  Date/Time Received: November 25, 2009 4:40 PM  Date/Time Reported: November 25, 2009 4:40 PM   Routine Urinalysis   Color: yellow Appearance: Clear Glucose: negative   (Normal Range: Negative) Bilirubin: negative   (Normal Range: Negative) Ketone: negative   (Normal Range: Negative) Spec. Gravity: 1.015   (Normal Range: 1.003-1.035) Blood: negative   (Normal Range: Negative) pH: 5.0   (Normal  Range: 5.0-8.0) Protein: negative   (Normal Range: Negative) Urobilinogen: 0.2   (Normal Range: 0-1) Nitrite: negative   (Normal Range: Negative) Leukocyte Esterace: negative   (Normal Range: Negative)

## 2010-06-16 NOTE — Assessment & Plan Note (Signed)
Summary: 1 m f/u htn   Vital Signs:  Patient profile:   65 year old female Height:      65.5 inches Weight:      160.8 pounds BMI:     26.45 Temp:     98.3 degrees F oral Pulse rate:   80 / minute Pulse rhythm:   regular BP sitting:   130 / 84  (left arm) Cuff size:   regular  Vitals Entered By: Benny Lennert CMA Duncan Dull) (Sep 23, 2009 4:02 PM)  History of Present Illness: Chief complaint 1 month follow up  HTN, improved on metoprolol 25 mg daily.. Pulse stable.  Essential tremor is not any better on BBlocker.  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 Take 1 Tablet By Mouth Once A Day 2)  Estradiol 0.5 Mg  Tabs (Estradiol) .... Take 1 Tablet By Mouth Once A Day 3)  Singulair 10 Mg  Tabs (Montelukast Sodium) .... Take 1 Tab By Mouth At Bedtime 4)  Claritin 10 Mg Tabs (Loratadine) .... Take 1 Tablet By Mouth Once A Day As Needed 5)  Metoprolol Succinate 25 Mg Xr24h-Tab (Metoprolol Succinate) .Marland Kitchen.. 1 Tab By Mouth Daily 6)  Vitamin D 400 Unit Caps (Cholecalciferol) .... Take 1 Tablet 2 Times Daily  Allergies: 1)  ! Codeine  Past History:  Past medical, surgical, family and social histories (including risk factors) reviewed, and no changes noted (except as noted below).  Past Medical History: Reviewed  history from 07/17/2009 and no changes required. ONYCHOMYCOSIS, TOENAILS (ICD-110.1) HYPERLIPIDEMIA (ICD-272.2) LOW BACK PAIN, CHRONIC (ICD-724.2) IRRITABLE BOWEL SYNDROME (ICD-564.1) TREMOR, ESSENTIAL (ICD-333.1) ALLERGIC RHINITIS (ICD-477.9) ASTHMA (ICD-493.90) Hypertension  Past Surgical History: Reviewed history from 04/28/2007 and no changes required. PFTs nml 7/08 Chest CT: 7/08: spot on left lung hospitalized 7/08 for fever unknown origin found to be sphenoid sinusitis and UTI hysterectomy , total 2002 1986 BTL Tonsillectomy 1967/68 MRI of back: bulging disc  Family History: Reviewed history from 02/06/2007 and no changes required. father age 19 trigeminal neuralgia mother died 59 CVA, tremor, DM, CHF sister and 3 brothers: DM, CAD, IBS, tremor aunts and cousin with breast cancer MGF: colon cancer no MI < 53  Social History: Reviewed history from 02/06/2007 and no changes required. 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   Mouth:  Oral mucosa and oropharynx without lesions or exudates.  Teeth in good repair. Neck:  no carotid bruit or thyromegaly no cervical or supraclavicular lymphadenopathy  Lungs:  Normal respiratory effort, chest expands symmetrically. Lungs are clear to auscultation, no crackles or wheezes. Heart:  Normal rate and regular rhythm. S1 and S2 normal without gallop,  murmur, click, rub or other extra sounds. Abdomen:  Bowel sounds positive,abdomen soft and mild B lower quadrant tenderness without masses, organomegaly or hernias noted. Pulses:  R and L posterior tibial pulses are full and equal bilaterally  Extremities:  no edema  Neurologic:  tremor continue in  hands.  Skin:  Intact without suspicious lesions or rashes Psych:  mildly distraught   Impression & Recommendations:  Problem # 1:  HYPERTENSION (ICD-401.9) Well controlled. Continue current medication.  Her updated medication list for this problem includes:    Metoprolol Succinate 50 Mg Xr24h-tab (Metoprolol succinate) .Marland Kitchen... Take 1 tablet by mouth once a day  Problem # 2:  TREMOR, ESSENTIAL (ICD-333.1) Minimal improvemenyt ..increase metoprolol to 50 mg. Call if not improved and interested in further increase.   Problem # 3:  HYPERLIPIDEMIA (ICD-272.2)  Improved LDL .Marland Kitchennow at goal less tahn 130 with lifestyle change. Continue current medication.  Triglycerides still above goal <150, and good cholesterol needs to be above 40..contnue weight loss, exercsie and healthy veggie fats instead of animal fats.   Her updated medication list for this problem includes:    Simvastatin 40 Mg Tabs (Simvastatin) .Marland Kitchen... Take 1 tablet by mouth once a day  Labs Reviewed: SGOT: 29 (05/22/2009)   SGPT: 31 (05/22/2009)  Lipid Goals: Chol Goal: 200 (04/19/2007)   HDL Goal: 40 (04/19/2007)   LDL Goal: 130 (04/19/2007)   TG Goal: 150 (04/19/2007)  Prior 10 Yr Risk Heart Disease: 15 % (04/19/2007)   HDL:38.20 (08/26/2009), 43 (05/22/2009)  LDL:108 (08/26/2009), 137 (05/22/2009)  Chol:177 (08/26/2009), 211 (05/22/2009)  Trig:156.0 (08/26/2009), 155 (05/22/2009)  Complete Medication List: 1)  Simvastatin 40 Mg Tabs (Simvastatin) .... Take 1 tablet by mouth once a day 2)  Estradiol 0.5 Mg Tabs (Estradiol) .... Take 1 tablet by mouth once a day 3)  Singulair 10 Mg Tabs (Montelukast sodium) .... Take 1 tab by mouth at bedtime 4)  Claritin 10 Mg Tabs (Loratadine) .... Take 1 tablet by mouth once a day as needed 5)  Metoprolol Succinate 50 Mg Xr24h-tab (Metoprolol succinate) .... Take 1 tablet by mouth once a day 6)  Vitamin D 400 Unit Caps (Cholecalciferol) .... Take 1 tablet 2 times daily  Patient  Instructions: 1)  In next week..check BP and pulse at [facility]..  Call if HR <60, or BP <90/60. 2)  Schedule CPX in 2-3 months.  Prescriptions: METOPROLOL SUCCINATE 50 MG XR24H-TAB (METOPROLOL SUCCINATE) Take 1 tablet by mouth once a day  #30 x 11   Entered and Authorized by:   Kerby Nora MD   Signed by:   Kerby Nora MD on 09/23/2009   Method used:   Electronically to        [id number]       [address]       [place], [place]  [address]       Ph: [phone]       Fax: [phone]   RxID:   [id number]   Current Allergies (reviewed today): ! CODEINE

## 2010-06-16 NOTE — Assessment & Plan Note (Signed)
Summary: dizzy/nt   Vital Signs:  Patient profile:   65 year old female Height:      65.5 inches Weight:      160 pounds BMI:     26.32 Temp:     97.8 degrees F oral Pulse rate:   80 / minute Pulse rhythm:   regular BP sitting:   142 / 84  (left arm) Cuff size:   regular  Vitals Entered By: Benny Lennert CMA Duncan Dull) (July 17, 2009 10:48 AM)  History of Present Illness: 65 year old female:  patient complains of headache,dizziness, and nausea patient had fire dept take blood pressure was 34/40  65 year old female:  woke up and had a headache. went to work and got blood pressure checked and was 150/98.   Does not feel in balance, feels like is off balance. Does not feel steady like spinning.   HTN: new onset, has had multiple high BP readings in the past. Some HA  Vertigo: onset this morning, room spinning, worse with motion. No recent illness. No syncope. Has had some nausea. No prior vertigo. No other neurological symptoms.   Allergies: 1)  ! Codeine  Past History:  Past medical, surgical, family and social histories (including risk factors) reviewed, and no changes noted (except as noted below).  Past Medical History: ONYCHOMYCOSIS, TOENAILS (ICD-110.1) HYPERLIPIDEMIA (ICD-272.2) LOW BACK PAIN, CHRONIC (ICD-724.2) IRRITABLE BOWEL SYNDROME (ICD-564.1) TREMOR, ESSENTIAL (ICD-333.1) ALLERGIC RHINITIS (ICD-477.9) ASTHMA (ICD-493.90) Hypertension  Past Surgical History: Reviewed history from 04/28/2007 and no changes required. PFTs nml 7/08 Chest CT: 7/08: spot on left lung hospitalized 7/08 for fever unknown origin found to be sphenoid sinusitis and UTI hysterectomy , total 2002 1986 BTL Tonsillectomy 1967/68 MRI of back: bulging disc  Family History: Reviewed history from 02/06/2007 and no changes required. father age 39 trigeminal neuralgia mother died 38 CVA, tremor, DM, CHF sister and 3 brothers: DM, CAD, IBS, tremor aunts and cousin with breast  cancer MGF: colon cancer no MI < 66  Social History: Reviewed history from 02/06/2007 and no changes required. 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 Neuro:  Complains of headaches and sensation of room spinning; denies difficulty with concentration, disturbances in coordination, falling down, inability to speak, memory loss, numbness, tingling, and visual disturbances.  Physical Exam  General:  Well-developed,well-nourished,in no acute distress; alert,appropriate and cooperative throughout examination Head:  Normocephalic and atraumatic without obvious abnormalities. No apparent alopecia or balding. Eyes:  vision grossly intact.   Ears:  no external deformities.    PATIENT LAID SUPINE, HEAD LATERALLY ROTATED AND SYMPTOMS REPRODUCED NO NYSTAGMUS  ROTATED ON STOOL IN OFFICE AND VERTIGO SEVERE REPRODUCED. Nose:  no external deformity.   Lungs:  Normal respiratory effort, chest expands symmetrically. Lungs are clear to auscultation, no crackles or wheezes. Heart:  Normal rate and regular rhythm. S1 and S2 normal without gallop, murmur, click, rub or other extra sounds. Extremities:  No clubbing, cyanosis, edema, or deformity noted with normal full range of motion of all joints.   Neurologic:  gait normal.   Cervical Nodes:  No lymphadenopathy noted Psych:  mildly distraught   Impression & Recommendations:  Problem # 1:  VERTIGO (ICD-780.4) Assessment New  most c/w BPV  EKG: Normal sinus rhythm. Normal axis, normal R wave progression, No  acute ST elevation or depression.   symptomatically treat with meclizine and valium if severe if symptoms persist, then vestibular rehab in 3-4 weeks a good idea  Her updated medication list for this problem includes:    Claritin 10 Mg  Tabs (Loratadine) .Marland Kitchen... Take 1 tablet by mouth once a day as needed    Meclizine Hcl 25 Mg Tabs (Meclizine hcl) .Marland Kitchen... 1 by mouth 4 times daily as needed vertigo  Orders: EKG w/ Interpretation (93[address])  Problem # 2:  HYPERTENSION (ICD-401.9) Assessment: New  Unstable and needs to start medications  recheck with Dr. Ermalene Searing in 1 month  Her updated medication list for this problem includes:    Hydrochlorothiazide 12.5 Mg Tabs (Hydrochlorothiazide) .Marland Kitchen... Take 1 tab  by mouth every morning  Orders: EKG w/ Interpretation ([address])  Complete Medication List: 1)  Simvastatin 40 Mg Tabs (Simvastatin) .... Take 1 tablet by mouth once a day 2)  Estradiol 0.5 Mg Tabs (Estradiol) .... Take 1 tablet by mouth once a day 3)  Singulair 10 Mg Tabs (Montelukast sodium) .... Take 1 tab by mouth at bedtime 4)  Claritin 10 Mg Tabs (Loratadine) .... Take 1 tablet by mouth once a day as needed 5)  Align Caps (Probiotic product) .... Take 1 tablet by mouth once a day 6)  Alprazolam 0.25 Mg Tabs (Alprazolam) .... Take 1 tablet by mouth once a day as needed anxiety 7)  Vitamin D (ergocalciferol) 50000 Unit Caps (Ergocalciferol) .... Take 1 tablet once a week on the same day each week 8)  Hydrochlorothiazide 12.5 Mg Tabs (Hydrochlorothiazide) .... Take 1 tab  by mouth every morning 9)  Meclizine Hcl 25 Mg Tabs (Meclizine hcl) .Marland Kitchen.. 1 by mouth 4 times daily as needed vertigo 10)  Diazepam 5 Mg Tabs (Diazepam) .Marland Kitchen.. 1 by mouth q 12 hours as needed vertigo  Patient Instructions: 1)  f/u Dr. Ermalene Searing 1 month Prescriptions: DIAZEPAM 5 MG TABS (DIAZEPAM) 1 by mouth q 12 hours as needed vertigo  #20 x 0   Entered and Authorized by:   Hannah Beat MD   Signed by:   Hannah Beat MD on 07/17/2009   Method used:   Print then Give to Patient   RxID:   [id number] MECLIZINE HCL 25 MG TABS (MECLIZINE HCL) 1 by mouth 4 times daily as needed vertigo  #50 x 0   Entered and Authorized by:   Hannah Beat MD    Signed by:   Hannah Beat MD on 07/17/2009   Method used:   Print then Give to Patient   RxID:   [id number] HYDROCHLOROTHIAZIDE 12.5 MG  TABS (HYDROCHLOROTHIAZIDE) Take 1 tab  by mouth every morning  #30 x 3   Entered and Authorized by:   Hannah Beat MD   Signed by:   Hannah Beat MD on 07/17/2009   Method used:   Print then Give to Patient   RxID:   [id number]   Current Allergies (reviewed today): ! CODEINE

## 2010-06-16 NOTE — Progress Notes (Signed)
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  She has a headache, lightheaded, ate a pack of nabs and now she feels sick to her stomach.  Scheduled patient to see Dr. Patsy Lager at 11:00 today. Initial call taken by: Linde Gillis CMA Duncan Dull),  July 17, 2009 10:24 AM

## 2010-06-18 NOTE — Assessment & Plan Note (Signed)
Summary: 10:15 CONGESTION/CLE   Vital Signs:  Patient profile:   65 year old female Height:      65.5 inches Weight:      163.50 pounds BMI:     26.89 Temp:     98.2 degrees F oral Pulse rate:   80 / minute Pulse rhythm:   regular BP sitting:   140 / 80  (left arm) Cuff size:   regular  Vitals Entered By: Benny Lennert CMA Duncan Dull) (May 26, 2010 10:32 AM)  History of Present Illness: Chief complaint congestion  Also tremor continues despite several treatments we have tried. She wishes to see neurologist.   Acute Visit History:      The patient complains of nasal discharge.  These symptoms began 4 weeks ago.  She denies cough, earache, fever, nausea, sinus problems, and sore throat.  Other comments include: Intermittant nasal cogestion.. cannot breath through nose. No body ache. OTC tried advil congeston, treid allgra instead of claritin, bt no improvement. Mucinex has not helped. Has been using afrin over the last  week. .        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 Take 1 Tablet By Mouth Once A Day 2)  Estradiol 0.5 Mg  Tabs (Estradiol) .... Take 1 Tablet By Mouth Once A Day 3)  Singulair 10 Mg  Tabs (Montelukast Sodium) .... Take 1 Tab By Mouth At Bedtime 4)  Claritin  10 Mg Tabs (Loratadine) .... Take 1 Tablet By Mouth Once A Day As Needed 5)  Vitamin D 400 Unit Caps (Cholecalciferol) .... Take 1 Tablet 2 Times Daily 6)  Fluticasone Propionate 50 Mcg/act Susp (Fluticasone Propionate) .... 2 Sprays Per Nostril Daily 7)  Prednisone 20 Mg Tabs (Prednisone) .... 3 Tabs By Mouth Daily X 3 Days, Then 2 Tabs By Mouth Daily X 2 Days Then 1 Tab By Mouth Daily X 2 Days  Allergies: 1)  ! Codeine  Past History:  Past medical, surgical, family and social histories (including risk factors) reviewed, and no changes noted (except as noted below).  Past Medical History: Reviewed history from 07/17/2009 and no changes required. ONYCHOMYCOSIS, TOENAILS (ICD-110.1) HYPERLIPIDEMIA (ICD-272.2) LOW BACK PAIN, CHRONIC (ICD-724.2) IRRITABLE BOWEL SYNDROME (ICD-564.1) TREMOR, ESSENTIAL (ICD-333.1) ALLERGIC RHINITIS (ICD-477.9) ASTHMA (ICD-493.90) Hypertension  Past Surgical History: Reviewed history from 04/28/2007 and no changes required. PFTs nml 7/08 Chest CT: 7/08: spot on left lung hospitalized 7/08 for fever unknown origin found to be sphenoid sinusitis and UTI hysterectomy , total 2002 1986 BTL Tonsillectomy 1967/68 MRI of back: bulging disc  Family History: Reviewed history from 02/06/2007 and no changes required. father age 108 trigeminal neuralgia mother died 72 CVA, tremor, DM, CHF sister and 3 brothers: DM, CAD, IBS, tremor aunts and cousin with breast cancer MGF: colon cancer no MI < 36  Social History: Reviewed  history from 02/06/2007 and no changes required. 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 Resp:  Denies shortness of breath, sputum productive, and wheezing.  Physical Exam  General:  Well-developed,well-nourished,in no acute distress; alert,appropriate  and cooperative throughout examination Head:  no maxillary sinus ttp  Nose:  B turbinates swollen red...occluding B nares Mouth:  Oral mucosa and oropharynx without lesions or exudates.  Teeth in good repair. Neck:  no carotid bruit or thyromegaly no cervical or supraclavicular lymphadenopathy  Lungs:  Normal respiratory effort, chest expands symmetrically. Lungs are clear to auscultation, no crackles or wheezes. Heart:  Normal rate and regular rhythm. S1 and S2 normal without gallop, murmur, click, rub or other extra sounds. Pulses:  R and L posterior tibial pulses are full and equal bilaterally  Extremities:  no edema   Impression & Recommendations:  Problem # 1:  ALLERGIC RHINITIS (ICD-477.9) Nasal congestion... overuse of afrin.  Pt requests results ASAP.Marland Kitchen wishes to try course of prednisone along with nasal steroid spray. She nderstands SE/risk of prednisone . Her updated medication list for this problem includes:    Claritin 10 Mg Tabs (Loratadine) .Marland Kitchen... Take 1 tablet by mouth once a day as needed    Fluticasone Propionate 50 Mcg/act Susp (Fluticasone propionate) .Marland Kitchen... 2 sprays per nostril daily  Complete Medication List: 1)  Simvastatin 40 Mg Tabs (Simvastatin) .... Take 1 tablet by mouth once a day 2)  Estradiol 0.5 Mg Tabs (Estradiol) .... Take 1 tablet by mouth once a day 3)  Singulair 10 Mg Tabs (Montelukast sodium) .... Take 1 tab by mouth at bedtime 4)  Claritin 10 Mg Tabs (Loratadine) .... Take 1 tablet by mouth once a day as needed 5)  Vitamin D 400 Unit Caps (Cholecalciferol) .... Take 1 tablet 2 times daily 6)  Fluticasone Propionate 50 Mcg/act Susp (Fluticasone propionate) .... 2 sprays per nostril daily 7)  Prednisone 20 Mg Tabs (Prednisone) .... 3 tabs by mouth daily x 3 days, then 2 tabs by mouth daily x 2 days then 1 tab by mouth daily x 2 days  Other Orders: Neurology Referral (Neuro)  Patient Instructions: 1)  Start nasal steroid spray. 2)   Start prednisone  taper. 3)   Stop afrin ASAP. 4)   Call if not improving as tolerated.  5)  Referral Appointment Information 6)  Day/Date: 7)  Time: 8)  Place/MD: 9)  Address: 10)  Phone/Fax: 11)  Patient given appointment information. Information/Orders faxed/mailed.  Prescriptions: PREDNISONE 20 MG TABS (PREDNISONE) 3 tabs by mouth daily x 3 days, then 2 tabs by mouth daily x 2 days then 1 tab by mouth daily x 2 days  #15 x 0   Entered and Authorized by:   Kerby Nora MD   Signed by:   Kerby Nora MD on 05/26/2010   Method used:   Electronically to        [id number]       [address]       [place], [place]  [address]       Ph: [phone]       Fax: [phone]   RxID:   [id number] FLUTICASONE PROPIONATE 50 MCG/ACT SUSP (FLUTICASONE PROPIONATE) 2 sprays per nostril daily  #1 x 3   Entered and Authorized by:   Kerby Nora MD   Signed by:  Kerby Nora MD on 05/26/2010   Method used:   Electronically to        [id number]       [address]       [place], [place]  [address]       Ph: [phone]       Fax: [phone]   RxID:   [id number]    Orders Added: 1)  Neurology Referral [Neuro] 2)  Est. Patient Level III [[address]]    Current Allergies (reviewed today): ! CODEINE

## 2010-06-18 NOTE — Progress Notes (Signed)
Summary: has a cold   Phone Note Call from Patient Call back at Work Phone [phone]   Caller: Patient Call For: Kerby Nora MD Summary of Call: Patient says that she has had a cold for about 3 weeks. She says that she is very stopped up, has some non productive  cough, no fever. She is asking what you recommend she take OTC. Please advise.  Initial call taken by: Melody Comas,  May 19, 2010 11:09 AM  Follow-up for Phone Call        Nasal saline irrigation, mucinex, no decongestant.  If not improving in 3-4 days make appt to be seen.  Continue claritin and singulair.  Follow-up by: Kerby Nora MD,  May 19, 2010 2:01 PM  Additional Follow-up for Phone Call Additional follow up Details #1::        Patient advised.Consuello Masse CMA   Additional Follow-up by: Benny Lennert CMA Duncan Dull),  May 19, 2010 2:13 PM

## 2010-09-16 ENCOUNTER — Telehealth: Payer: Self-pay | Admitting: *Deleted

## 2010-09-16 DIAGNOSIS — R5383 Other fatigue: Secondary | ICD-10-CM

## 2010-09-16 DIAGNOSIS — E559 Vitamin D deficiency, unspecified: Secondary | ICD-10-CM

## 2010-09-16 DIAGNOSIS — I10 Essential (primary) hypertension: Secondary | ICD-10-CM

## 2010-09-16 DIAGNOSIS — E782 Mixed hyperlipidemia: Secondary | ICD-10-CM

## 2010-09-16 NOTE — Telephone Encounter (Signed)
Pt would like to have her Vitamin D and cholesterol levels checked.  She says she is feeling tired and achy and felt that way before when her vitamin d level was low. She hasnt had her cholesterol checked in awhile, so would like that checked as well.

## 2010-09-22 NOTE — Telephone Encounter (Signed)
Spoke with pt and scheduled lab appt

## 2010-09-23 ENCOUNTER — Other Ambulatory Visit (INDEPENDENT_AMBULATORY_CARE_PROVIDER_SITE_OTHER): Payer: 59 | Admitting: Family Medicine

## 2010-09-23 DIAGNOSIS — R5383 Other fatigue: Secondary | ICD-10-CM

## 2010-09-23 DIAGNOSIS — I10 Essential (primary) hypertension: Secondary | ICD-10-CM

## 2010-09-23 DIAGNOSIS — E782 Mixed hyperlipidemia: Secondary | ICD-10-CM

## 2010-09-23 DIAGNOSIS — E559 Vitamin D deficiency, unspecified: Secondary | ICD-10-CM

## 2010-09-23 DIAGNOSIS — Z13220 Encounter for screening for lipoid disorders: Secondary | ICD-10-CM

## 2010-09-23 DIAGNOSIS — R5381 Other malaise: Secondary | ICD-10-CM

## 2010-09-23 LAB — COMPREHENSIVE METABOLIC PANEL
BUN: 11 mg/dL (ref 6–23)
CO2: 26 mEq/L (ref 19–32)
Creatinine, Ser: 0.7 mg/dL (ref 0.4–1.2)
GFR: 89.9 mL/min (ref >60.00)
Glucose, Bld: 100 mg/dL — ABNORMAL HIGH (ref 70–99)
Sodium: 140 mEq/L (ref 135–145)
Total Bilirubin: 0.5 mg/dL (ref 0.3–1.2)
Total Protein: 6.6 g/dL (ref 6.0–8.3)

## 2010-09-23 LAB — LIPID PANEL
Cholesterol: 207 mg/dL — ABNORMAL HIGH (ref 0–200)
VLDL: 30.2 mg/dL (ref 0.0–40.0)

## 2010-09-23 LAB — CBC WITH DIFFERENTIAL/PLATELET
Basophils Absolute: 0 10*3/uL (ref 0.0–0.1)
Eosinophils Absolute: 0.1 10*3/uL (ref 0.0–0.7)
Hemoglobin: 12.8 g/dL (ref 12.0–15.0)
Lymphocytes Relative: 31.4 % (ref 12.0–46.0)
Lymphs Abs: 2.2 10*3/uL (ref 0.7–4.0)
MCHC: 34 g/dL (ref 30.0–36.0)
Neutro Abs: 4.3 10*3/uL (ref 1.4–7.7)
RDW: 13.6 % (ref 11.5–14.6)

## 2010-09-23 LAB — VITAMIN B12: Vitamin B-12: 156 pg/mL — ABNORMAL LOW (ref 211–911)

## 2010-09-29 NOTE — Discharge Summary (Signed)
NAMESHALLON, YAKLIN                  ACCOUNT NO.:  [id number]   MEDICAL RECORD NO.:  [id number]          PATIENT TYPE:  INP   LOCATION:  5743                         FACILITY:  [facility]   PHYSICIAN:  Valerie A. Felicity Coyer, MDDATE OF BIRTH:  1946-03-11   DATE OF ADMISSION:  04/28/2007  DATE OF DISCHARGE:  05/02/2007                               DISCHARGE SUMMARY   DISCHARGE DIAGNOSES:  1. Left lower quadrant pain with fever and leukocytosis, rule out      abscess.  2. Hypokalemia.  3. Anemia.  4. Cough.  5. Hyperlipidemia.  6. Essential tremor.  7. Borderline hypertension.   HISTORY OF PRESENT ILLNESS:  Ms. Langenderfer is a 65 year old female who was  admitted on April 28, 2007 with chief complaint of fever and left  lower quadrant pain.  She noted a fever as high as 103 over the past 3  days prior to this admission.  She also noted some nausea, vomiting and  nasal congestion with some cough.  She was admitted for further  evaluation and treatment.   PAST MEDICAL HISTORY:  1. Elevated blood pressure without diagnosis of hypertension.  2. Onychomycosis of toenails.  3. Hyperlipidemia.  4. Low back pain, chronic.  5. Irritable bowel syndrome.  6. Essential tremor.  7. Allergic rhinitis.  8. Asthma.   HOSPITAL COURSE:  Left lower quadrant pain with fever and leukocytosis.  The patient was admitted.  She had an ultrasound of the pelvis performed  on April 29, 2007 which showed a complex tubular structure at the  left adnexa, likely hydrosalpinx.  Followup was recommended in 6-12  weeks to ensure stability.  CT was performed on April 27, 2007 of the  abdomen and pelvis which noted a cystic cylindrical structure in the  left hemipelvis most consistent with a dilated fallopian tube thought to  be a hydrosalpinx.  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   There was no clear  vesicovaginal fistula noted.  The patient was seen by OB/GYN during this  admission, Dr. Konrad Dolores.  As the patient was afebrile and follow up CT  was negative for vesicovaginal fistula, the patient was discharged to  home with close outpatient followup.   DISCHARGE MEDICATIONS:  1. Toprol XL 50 mg p.o. daily.  2. Cipro 20 mg p.o. daily.  3. Estradiol 0.5 mg p.o. daily.  4. Augmentin 875 mg p.o. b.i.d. x 8 days   PERTINENT LABORATORY DATA:  At time of discharge:  Hemoglobin 10.8,  hematocrit 32.2, white blood cell count 6.2, platelets 191.      Sandford Craze, NP      Raenette Rover. Felicity Coyer, MD  Electronically Signed    MO/MEDQ  D:  05/31/2007  T:  05/31/2007  Job:  [id number]   cc:   Kerby Nora, MD

## 2010-09-29 NOTE — Op Note (Signed)
NAMEQUINLYN, TEP                  ACCOUNT NO.:  [id number]   MEDICAL RECORD NO.:  [id number]          PATIENT TYPE:  AMB   LOCATION:  SDC                           FACILITY:  [facility]   PHYSICIAN:  Freddy Finner, M.D.   DATE OF BIRTH:  09-23-1945   DATE OF PROCEDURE:  06/01/2007  DATE OF DISCHARGE:                               OPERATIVE REPORT   PREOPERATIVE DIAGNOSES:  1. Left fallopian tube remnant with hydrosalpinx.  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   POSTOPERATIVE DIAGNOSES:  1. Left fallopian tube remnant with hydrosalpinx.  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   OPERATIVE PROCEDURES:  1. Left salpingectomy.  2. Aspiration and culture of hydrosalpinx fluid.  3. Fulguration of connection to peritoneum of abdominal cavity      overlying the vaginal cuff.   ANESTHESIA:  General endotracheal.   INTRAOPERATIVE COMPLICATIONS:  None.   ESTIMATED INTRAOPERATIVE BLOOD LOSS:  Less than 10 mL.   The patient is a 65 year old white single female who had a total vaginal  hysterectomy, bilateral salpingo-oophorectomy approximately 2-3 years  prior to this surgery.  She did have a bladder injury at that time,  which was repaired and has been no further problem.  Over the last  approximately 6 months she has had repeated episodes of febrile illness,  severe left lower abdominal and pelvic pain, and has been hospitalized  and treated with IV antibiotics.  She was more recently evaluated at  [facility], where a CT cystogram was obtained showing no  communication of the bladder to the vagina.  She had CT findings showing  a tubular mass in the left pelvis.  Other studies were done.  She was  managed at that time with IV antibiotics and her fever defervesced and  she was discharged home, is admitted now for surgical intervention.   She was admitted on the morning of surgery.  She was given a gram of  Ancef IV.  She was taken to the operating room and placed under  adequate  general endotracheal anesthesia, placed in the dorsal lithotomy position  using Allen stirrup system.  Betadine prep of abdomen, perineum and  vagina was carried out in standard fashion.  The bladder was evacuated  with a sterile catheter.  An Allis clamp was attached to a tented  portion of the vaginal cuff on the left.  A sponge forceps with two  sponges in the tip was also placed in the vagina for help in identifying  vagina during the procedure.  Sterile drapes were then applied.  Two  small incisions were made, one at the umbilicus and one just above the  symphysis.  through the upper incision an 11-mm bladed disposable trocar  was introduced.  Direct inspection with the laparoscopic revealed  adequate placement with no evidence of injury on entry.  Pneumoperitoneum was allowed to accumulate using carbon dioxide gas.  Through a separate incision just above the symphysis, a 5-mm trocar was  placed.  A blunt probe and later a spring-loaded grasping forceps were  used through this incision.  Careful examination  of the abdominal cavity  revealed no other apparent abnormalities except I could not visualize  right upper quadrant because of omental adhesions.  The appendix was  visualized and was normal.  The tubal remnant was grasped with a ring  forceps and using the  tripolar gyrus device, progressive pedicles were  developed to free the tube completely.  The tube was aspirated with  fluid submitted for culture.  The tube was retrieved through the  umbilical trocar sleeve.  A total of approximately 3 L of irrigating  solution was then used using the Nezhat irrigation system to copiously  irrigate the abdominal cavity.  The fluid was removed.  Hemostasis was  complete.  At this point the procedure was ended, the gas was allowed to  escape from the abdomen, the instruments were removed.  The skin  incisions were closed with interrupted subcuticular sutures of 3-0  Dexon, 0.25%  Marcaine was injected into the incision sites for  postoperative analgesia.  The patient was given 30 mg Toradol IV, 30 mg  IM for postoperative analgesia.  Steri-Strips were applied to lower  incision, sterile dressing to the umbilical incision.  The patient was  awakened and taken to the recovery room in good condition.  She will be  discharged in the immediate postop period.  She is to continue on  antibiotic in the form of Ceftin 500 mg b.i.d. for a week.  She is to  return to the office in approximately 2 weeks for postoperative follow-  up.  She is to call for fever, severe pain or any heavy bleeding from  the incisions.  She was given Vicodin to be taken as needed for  postoperative pain.  She is to resume all of her preoperative  medications.      Freddy Finner, M.D.  Electronically Signed     WRN/MEDQ  D:  06/01/2007  T:  06/02/2007  Job:  [id number]

## 2010-10-09 ENCOUNTER — Other Ambulatory Visit: Payer: Self-pay | Admitting: Family Medicine

## 2010-10-23 ENCOUNTER — Encounter: Payer: Self-pay | Admitting: Family Medicine

## 2010-10-26 ENCOUNTER — Ambulatory Visit (INDEPENDENT_AMBULATORY_CARE_PROVIDER_SITE_OTHER): Payer: 59 | Admitting: Family Medicine

## 2010-10-26 ENCOUNTER — Encounter: Payer: Self-pay | Admitting: Family Medicine

## 2010-10-26 VITALS — BP 136/80 | HR 68 | Temp 98.3°F | Wt 145.0 lb

## 2010-10-26 DIAGNOSIS — IMO0001 Reserved for inherently not codable concepts without codable children: Secondary | ICD-10-CM

## 2010-10-26 DIAGNOSIS — M791 Myalgia, unspecified site: Secondary | ICD-10-CM | POA: Insufficient documentation

## 2010-10-26 DIAGNOSIS — R634 Abnormal weight loss: Secondary | ICD-10-CM | POA: Insufficient documentation

## 2010-10-26 DIAGNOSIS — E538 Deficiency of other specified B group vitamins: Secondary | ICD-10-CM

## 2010-10-26 NOTE — Assessment & Plan Note (Signed)
In setting of significant fatigue.   CBC WNL last month. Will replace with IM B12 for next few weeks weekly, f/u with PCP.

## 2010-10-26 NOTE — Patient Instructions (Signed)
This could be biceps tendon inflammation or being caused by zocor. Stop zocor for now until you see Dr. Ermalene Searing and monitor for improvement. Blood work today. May use tylenol for discomfort, heat to arms. Change to B12 injections for next few months (weekly for 1 month then monthly).  Selena Batten will help you set this up. Keep appointment with Dr. Leonard Schwartz in July, come in sooner if any worsening or changing symptoms.

## 2010-10-26 NOTE — Assessment & Plan Note (Signed)
In setting of zocor use.  check CPK.  Advised hold statin until sees Dr. B next month to see if improvement, use tylenol for discomfort as needed.  Alternatively could be bicipital tendonitis.

## 2010-10-26 NOTE — Progress Notes (Signed)
  Subjective:    Patient ID: Michelle Donovan, female    DOB: 26-Feb-1946, 65 y.o.   MRN: [id number]  HPI CC: pain?  65 yo patient new to me presents with bad pain bilateral arms, starts in elbows and goes up into neck and shoulders.  Almost like has lifted weights and strained muscles, but hasn't been lifting anything heavy.  This has been going on for several weeks, progressively worsening.  States pain both in joints and muscles.  Also states has no energy (several week duration).  Initially thought was low vit D, but recent check was normal.  Also endorses leg pain occasionally.  Has been on zocor for several years.  Hasn't taken anything for pain.    Denies paresthesias in arms and hands.  Does endorse hands going to sleep easily.  Denies fevers/chills, abd pain, n/v/d, night sweats.  Denies SOB.  Denies blood in stool or urine.  H/o smoking, quit 1998.  States UTD mammogram summer 2011, doesn't routinely check.  Colonoscopy 2005-6, told ok for 10 years.    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  Endorses 19 lb weight loss without trying since january.  Has tried to cut out soft drinks, watering down tea.  H/o IBS.  H/o essential tremor, went to neurologist 07/2010, thyroid checked and told a bit low.  Activity - sedentary. Wt Readings from Last 3 Encounters:  10/26/10 145 lb 0.6 oz (65.79 kg)  05/26/10 163 lb 8 oz (74.163 kg)  11/25/09 161 lb 9.6 oz (73.301 kg)   Family history of CAD, DM, CHF, CVA, breast CA (in aunts).  No other CA.  Blood work reviewed from last month - LDL , B12 150s, Vit D level 39, CMP and CBC WNL, TSH 1.3.  Medications and allergies reviewed and updated in chart. PMHx reviewed for relevance.  Review of Systems Per HPI    Objective:   Physical Exam  Nursing note and vitals reviewed. Constitutional: She appears well-developed and well-nourished. No distress.  HENT:  Head: Normocephalic and atraumatic.  Mouth/Throat: Oropharynx is clear and moist. No  oropharyngeal exudate.  Eyes: Conjunctivae and EOM are normal. Pupils are equal, round, and reactive to light. No scleral icterus.  Neck: Normal range of motion. Neck supple.  Cardiovascular: Normal rate, regular rhythm, normal heart sounds and intact distal pulses.   No murmur heard. Pulmonary/Chest: Effort normal and breath sounds normal. No respiratory distress. She has no wheezes. She has no rales.  Abdominal: Soft. Bowel sounds are normal. She exhibits no distension. There is no hepatosplenomegaly. There is no tenderness. There is no rebound.  Musculoskeletal: Normal range of motion.       Elbows and shoulder FROM. Biceps bilaterally tender more at distal insertion sites and proximal origination.  L>R Pos seer's sign. Pain with empty can sign bilat.  Lymphadenopathy:    She has no cervical adenopathy.       Right: No supraclavicular adenopathy present.       Left: No supraclavicular adenopathy present.  Skin: Skin is warm and dry. No rash noted.          Assessment & Plan:

## 2010-10-26 NOTE — Assessment & Plan Note (Signed)
Wt Readings from Last 3 Encounters:  10/26/10 145 lb 0.6 oz (65.79 kg)  05/26/10 163 lb 8 oz (74.163 kg)  11/25/09 161 lb 9.6 oz (73.301 kg)  No constitutional sxs or other concerning sxs.  Recent blood work ok.  Exam overall normal today.  In setting of pt cutting back on sodas, but nothing else has changed.  Unsure if only sodas would account for this weight loss.  Pt has appt with PCP in 1 month I believe for CPE, will route to her to be aware.  Pt states UTD screening protocols.

## 2010-10-27 ENCOUNTER — Ambulatory Visit (INDEPENDENT_AMBULATORY_CARE_PROVIDER_SITE_OTHER): Payer: 59 | Admitting: Family Medicine

## 2010-10-27 DIAGNOSIS — E538 Deficiency of other specified B group vitamins: Secondary | ICD-10-CM

## 2010-10-27 MED ORDER — CYANOCOBALAMIN 1000 MCG/ML IJ SOLN
1000.0000 ug | Freq: Once | INTRAMUSCULAR | Status: AC
  Administered 2010-10-27: 1000 ug via INTRAMUSCULAR

## 2010-10-27 NOTE — Progress Notes (Signed)
B12 injection given during nurse visit today. 

## 2010-11-03 ENCOUNTER — Ambulatory Visit (INDEPENDENT_AMBULATORY_CARE_PROVIDER_SITE_OTHER): Payer: 59 | Admitting: Family Medicine

## 2010-11-03 DIAGNOSIS — E538 Deficiency of other specified B group vitamins: Secondary | ICD-10-CM

## 2010-11-03 MED ORDER — CYANOCOBALAMIN 1000 MCG/ML IJ SOLN
1000.0000 ug | Freq: Once | INTRAMUSCULAR | Status: AC
  Administered 2010-11-03: 1000 ug via INTRAMUSCULAR

## 2010-11-05 NOTE — Progress Notes (Signed)
  Subjective:    Patient ID: Michelle Donovan, female    DOB: [birth year], 65 y.o.   MRN: [id number]  HPI  A B12 injection was given by our nursing staff to this patient. No physician encounter. No charge for OV.   Review of Systems     Objective:   Physical Exam        Assessment & Plan:

## 2010-11-06 ENCOUNTER — Ambulatory Visit: Payer: 59 | Admitting: Family Medicine

## 2010-11-10 ENCOUNTER — Ambulatory Visit (INDEPENDENT_AMBULATORY_CARE_PROVIDER_SITE_OTHER): Payer: 59 | Admitting: Family Medicine

## 2010-11-10 ENCOUNTER — Ambulatory Visit: Payer: 59 | Admitting: Family Medicine

## 2010-11-10 DIAGNOSIS — E538 Deficiency of other specified B group vitamins: Secondary | ICD-10-CM

## 2010-11-10 MED ORDER — CYANOCOBALAMIN 1000 MCG/ML IJ SOLN
1000.0000 ug | Freq: Once | INTRAMUSCULAR | Status: AC
  Administered 2010-11-10: 1000 ug via INTRAMUSCULAR

## 2010-11-11 NOTE — Progress Notes (Signed)
  Subjective:    Patient ID: Michelle Donovan, female    DOB: 09/12/1945, 65 y.o.   MRN: [id number]  HPI Given.   Review of Systems     Objective:   Physical Exam        Assessment & Plan:

## 2010-11-12 ENCOUNTER — Other Ambulatory Visit: Payer: Self-pay | Admitting: Family Medicine

## 2010-11-12 ENCOUNTER — Telehealth: Payer: Self-pay | Admitting: *Deleted

## 2010-11-12 NOTE — Telephone Encounter (Signed)
Have her make a follow up appt with me for reeval. She saw Dr. Reece Agar initially... It was not clearly a definate ortho issue for Dr. Patsy Lager.

## 2010-11-12 NOTE — Telephone Encounter (Signed)
Pt continues to have pain in left arm- from the elbow joint down to her hand, feels like deep joint and bone pain- hurts just about all the time.  Has to limit it's use, ROM isnt good.  She has tried taking tylenol but that doesn't help.  She is asking what to do next.  Should she see Dr. Patsy Lager or someone else?  Please advise.

## 2010-11-13 NOTE — Telephone Encounter (Signed)
Patient advised and appt scheduled.  °

## 2010-11-17 ENCOUNTER — Encounter: Payer: Self-pay | Admitting: Family Medicine

## 2010-11-17 ENCOUNTER — Ambulatory Visit (INDEPENDENT_AMBULATORY_CARE_PROVIDER_SITE_OTHER): Payer: 59 | Admitting: Family Medicine

## 2010-11-17 ENCOUNTER — Ambulatory Visit: Payer: 59

## 2010-11-17 ENCOUNTER — Ambulatory Visit (INDEPENDENT_AMBULATORY_CARE_PROVIDER_SITE_OTHER)
Admission: RE | Admit: 2010-11-17 | Discharge: 2010-11-17 | Disposition: A | Discharge status: Home / Self Care | Payer: 59 | Source: Ambulatory Visit | Attending: Family Medicine | Admitting: Family Medicine

## 2010-11-17 VITALS — BP 140/84 | HR 63 | Temp 98.1°F | Ht 65.0 in | Wt 160.8 lb

## 2010-11-17 DIAGNOSIS — E538 Deficiency of other specified B group vitamins: Secondary | ICD-10-CM

## 2010-11-17 DIAGNOSIS — IMO0001 Reserved for inherently not codable concepts without codable children: Secondary | ICD-10-CM

## 2010-11-17 DIAGNOSIS — M25529 Pain in unspecified elbow: Secondary | ICD-10-CM

## 2010-11-17 DIAGNOSIS — M791 Myalgia, unspecified site: Secondary | ICD-10-CM

## 2010-11-17 MED ORDER — DICLOFENAC SODIUM 75 MG PO TBEC: 75.0000 mg | DELAYED_RELEASE_TABLET | Freq: Two times a day (BID) | ORAL | Status: DC

## 2010-11-17 MED ORDER — CYANOCOBALAMIN 1000 MCG/ML IJ SOLN
1000.0000 ug | Freq: Once | INTRAMUSCULAR | Status: AC
  Administered 2010-11-17: 1000 ug via INTRAMUSCULAR

## 2010-11-17 NOTE — Patient Instructions (Addendum)
Start diclofenac 75 mg twice daily as needed for pain and inflammation. Limit twisting with left arm, no lifting greater than milk jug. Make 2 week follow up appt with Dr. Patsy Lager for Sports Medicine consult.   We will call you wiuth X-ray results.

## 2010-11-17 NOTE — Assessment & Plan Note (Signed)
No specific syndrome noted such as golfer's elbow, tennis elbow.  PAin is  More in antecubital fossa and in lower arm.  ? If due to tendonitis of muscle/s in this area. We will start with plain film to rule out fracture, although I doubt this will be illuminating.  Start NSAIDs, limit motion and lifting. Follow up with Dr. Patsy Lager if not improving as expected.

## 2010-11-17 NOTE — Progress Notes (Signed)
  Subjective:    Patient ID: Michelle Donovan, female    DOB: 09-Jun-1945, 65 y.o.   MRN: [id number]  HPI 65 yo patient saw Dr. Reece Agar  6/11 for  bad pain bilateral arms, starts in elbows and goes up into neck and shoulders. Almost like has lifted weights and strained muscles, but hasn't been lifting anything heavy. This has been going on for several weeks, progressively worsening. States pain both in joints and muscles. Also states has no energy (several week duration). Initially thought was low vit D, but recent check was normal. Also endorses leg pain occasionally. Has been on zocor for several years. Hasn't taken anything for pain.  Denies paresthesias in arms and hands. Does endorse hands going to sleep easily. Denies fevers/chills, abd pain, n/v/d, night sweats. Denies SOB. Denies blood in stool or urine.   CK was normal , Held Zocor.   Today...she has noted improvement in the myalgia in her upper arms and neck and leg pain. She feels musclular pain resolved. She continues to have isolated pain in left antecubital fossa, radiates down radial side of lower arm. Increase pain with pronation and supination.. Occ severe deep pain centrally in antecubital fossa. Has been using tylenol for pain without relief. Has not tried heating pad. No fall, no known injury.  She feels like pain started after blood draw in March          Review of Systems  Constitutional: Negative for fever and fatigue.  HENT: Negative for ear pain.   Respiratory: Negative for shortness of breath.   Cardiovascular: Negative for chest pain and leg swelling.  Neurological: Negative for weakness and numbness.       Objective:   Physical Exam  Constitutional: She appears well-developed and well-nourished.  HENT:  Head: Normocephalic.  Eyes: Conjunctivae and EOM are normal. Pupils are equal, round, and reactive to light.  Neck: Normal range of motion. Neck supple.  Cardiovascular: Normal rate, normal heart sounds and  intact distal pulses.  Exam reveals no gallop and no friction rub.   No murmur heard. Pulmonary/Chest: Effort normal and breath sounds normal. No respiratory distress. She has no wheezes. She has no rales. She exhibits no tenderness.  Musculoskeletal:       Left elbow: She exhibits decreased range of motion and deformity. She exhibits no swelling and no effusion. tenderness found. No radial head, no medial epicondyle, no lateral epicondyle and no olecranon process tenderness noted.       Pain centrally in antecubital fossa and down palmar surface of lower arm.  Pain with pronation, none with supination.           Assessment & Plan:

## 2010-11-17 NOTE — Assessment & Plan Note (Signed)
Resolved off Zocor 

## 2010-11-24 ENCOUNTER — Ambulatory Visit (INDEPENDENT_AMBULATORY_CARE_PROVIDER_SITE_OTHER): Payer: 59 | Admitting: Family Medicine

## 2010-11-24 DIAGNOSIS — E538 Deficiency of other specified B group vitamins: Secondary | ICD-10-CM

## 2010-11-24 MED ORDER — CYANOCOBALAMIN 1000 MCG/ML IJ SOLN
1000.0000 ug | Freq: Once | INTRAMUSCULAR | Status: AC
  Administered 2010-11-24: 1000 ug via INTRAMUSCULAR

## 2010-11-26 NOTE — Progress Notes (Signed)
  Subjective:    Patient ID: Michelle Donovan, female    DOB: Jan 17, 1946, 65 y.o.   MRN: [id number]  HPI  B12 given.  Review of Systems     Objective:   Physical Exam        Assessment & Plan:

## 2010-11-27 ENCOUNTER — Encounter: Payer: Self-pay | Admitting: Family Medicine

## 2010-11-27 ENCOUNTER — Ambulatory Visit (INDEPENDENT_AMBULATORY_CARE_PROVIDER_SITE_OTHER): Payer: 59 | Admitting: Family Medicine

## 2010-11-27 DIAGNOSIS — Z1231 Encounter for screening mammogram for malignant neoplasm of breast: Secondary | ICD-10-CM

## 2010-11-27 DIAGNOSIS — E538 Deficiency of other specified B group vitamins: Secondary | ICD-10-CM

## 2010-11-27 DIAGNOSIS — I10 Essential (primary) hypertension: Secondary | ICD-10-CM

## 2010-11-27 DIAGNOSIS — E782 Mixed hyperlipidemia: Secondary | ICD-10-CM

## 2010-11-27 DIAGNOSIS — Z780 Asymptomatic menopausal state: Secondary | ICD-10-CM

## 2010-11-27 DIAGNOSIS — Z0000 Encounter for general adult medical examination without abnormal findings: Secondary | ICD-10-CM

## 2010-11-27 NOTE — Patient Instructions (Addendum)
Work on healthy lifestyle: exercsie, weight loss and low fat/chol diet. Trial to restart zocor... Stop and call us if myalgia returns. Return to B12 orally... Call if feeling poorly again, we will recheck B12 levels in 3 months.  Stop by front desk to set up referrals.  Follow up in 1 year or earlier if needed.  Labs in 3 months.

## 2010-11-27 NOTE — Progress Notes (Signed)
Subjective:    Patient ID: Michelle Donovan, female    DOB: 1945-08-29, 65 y.o.   MRN: [id number]  HPI The patient's preventative maintenance and recommended screening tests for an annual wellness exam were reviewed in full today. Brought up to date unless services declined.  Counselled on the importance of diet, exercise, and its role in overall health and mortality. The patient's FH and SH was reviewed, including their home life, tobacco status, and drug and alcohol status.     Elevated Cholesterol: Myalgias resolved off zocor, but poor control cholesterol last check in 09/2010. Also pt had been on zocor for years in past with no issue and chol better controlled. No exercise and poor eating habits. Low motivation to make lifestyle changes.   Hypertension:   Well controlled on no medication.  Using medication without problems or lightheadedness: Not on medication Chest pain with exertion: none Edema : stable Short of breath:None Average home BPs: Not checking at home. Other issues:  Elbow pain, right: Almost 90 % improved on NSAIDs, no limitations of movement currently.  X-rays negative. On diclofenac 75 mg BID.  No nausea, no abdominal pain, some constipation.  Feeling better on B12 injections, but wants   Review of Systems  Constitutional: Negative for fever and fatigue.  HENT: Negative for ear pain.   Eyes: Negative for pain.  Respiratory: Negative for chest tightness and shortness of breath.   Cardiovascular: Negative for chest pain, palpitations and leg swelling.  Gastrointestinal: Negative for abdominal pain.  Genitourinary: Negative for dysuria.       Objective:   Physical Exam  Constitutional: Vital signs are normal. She appears well-developed and well-nourished. She is cooperative.  Non-toxic appearance. She does not appear ill. No distress.  HENT:  Head: Normocephalic.  Right Ear: Hearing, tympanic membrane, external ear and ear canal normal.  Left Ear: Hearing,  tympanic membrane, external ear and ear canal normal.  Nose: Nose normal.  Eyes: Conjunctivae, EOM and lids are normal. Pupils are equal, round, and reactive to light. No foreign bodies found.  Neck: Trachea normal and normal range of motion. Neck supple. Carotid bruit is not present. No mass and no thyromegaly present.  Cardiovascular: Normal rate, regular rhythm, S1 normal, S2 normal, normal heart sounds and intact distal pulses.  Exam reveals no gallop.   No murmur heard. Pulmonary/Chest: Effort normal and breath sounds normal. No respiratory distress. She has no wheezes. She has no rhonchi. She has no rales.  Abdominal: Soft. Normal appearance and bowel sounds are normal. She exhibits no distension, no fluid wave, no abdominal bruit and no mass. There is no hepatosplenomegaly. There is no tenderness. There is no rebound, no guarding and no CVA tenderness. No hernia.  Genitourinary: No breast swelling, tenderness, discharge or bleeding. Pelvic exam was performed with patient prone.  Lymphadenopathy:    She has no cervical adenopathy.    She has no axillary adenopathy.  Neurological: She is alert. She has normal strength. No cranial nerve deficit or sensory deficit.  Skin: Skin is warm, dry and intact. No rash noted.  Psychiatric: Her speech is normal and behavior is normal. Judgment normal. Her mood appears not anxious. Cognition and memory are normal. She does not exhibit a depressed mood.          Assessment & Plan:  Complete Physical Exam:  The patient's preventative maintenance and recommended screening tests for an annual wellness exam were reviewed in full today.  Brought up to date unless services declined.  Counselled on the importance of diet, exercise, and its role in overall health and mortality.  The patient's FH and SH was reviewed, including their home life, tobacco status, and drug and alcohol status.   NO PAP or DVE due to full hysterectomy, and asymptomatic.

## 2010-12-11 ENCOUNTER — Telehealth: Payer: Self-pay | Admitting: *Deleted

## 2010-12-11 NOTE — Telephone Encounter (Signed)
Patient says that she has been extremely constipated for over a week. She feels completely miserable. She has been putting miralax in her coffee, taking colace tablets, has taken one dulcolax, has used glycerin suppositories, and has mixed metamucil in her water. She is asking if there is anything else she can try. Uses [facility] [address]

## 2010-12-11 NOTE — Telephone Encounter (Signed)
Patient advised.

## 2010-12-11 NOTE — Telephone Encounter (Signed)
Try milk of magnesia daily and try a fleets enema until bowel movement along with this. If no BM by Monday, call back.

## 2010-12-12 ENCOUNTER — Other Ambulatory Visit: Payer: Self-pay | Admitting: Family Medicine

## 2010-12-17 ENCOUNTER — Telehealth: Payer: Self-pay | Admitting: *Deleted

## 2010-12-17 NOTE — Telephone Encounter (Signed)
Patient of Dr. Daphine Deutscher has a mammogram and bone density test scheduled tomorrow and they do not have an order.  Please fax to [phone].

## 2010-12-17 NOTE — Telephone Encounter (Signed)
Orders faxed. MK

## 2010-12-23 ENCOUNTER — Telehealth: Payer: Self-pay | Admitting: *Deleted

## 2010-12-23 ENCOUNTER — Encounter: Payer: Self-pay | Admitting: Family Medicine

## 2010-12-23 NOTE — Telephone Encounter (Signed)
Patient said that she has a fever blister that will not go away and she is asking if she can get a rx for valtrex called in to [facility] [address].

## 2010-12-24 ENCOUNTER — Encounter: Payer: Self-pay | Admitting: Family Medicine

## 2010-12-24 ENCOUNTER — Ambulatory Visit (INDEPENDENT_AMBULATORY_CARE_PROVIDER_SITE_OTHER): Payer: 59 | Admitting: Family Medicine

## 2010-12-24 ENCOUNTER — Ambulatory Visit: Payer: 59 | Admitting: Family Medicine

## 2010-12-24 VITALS — BP 120/72 | HR 90 | Temp 98.4°F | Ht 65.0 in | Wt 159.0 lb

## 2010-12-24 DIAGNOSIS — B029 Zoster without complications: Secondary | ICD-10-CM

## 2010-12-24 MED ORDER — VALACYCLOVIR HCL 1 G PO TABS: 1000.0000 mg | ORAL_TABLET | Freq: Three times a day (TID) | ORAL | Status: DC

## 2010-12-24 NOTE — Telephone Encounter (Signed)
Pt now thinks she has shingles, she has made an appt to come and be checked.

## 2010-12-24 NOTE — Progress Notes (Signed)
  Subjective:    Patient ID: Michelle Donovan, female    DOB: May 14, 1946, 65 y.o.   MRN: [id number]  HPI  Michelle Donovan, a 65 y.o. female presents today in the office for the following:    Tingling on the side of her face and chin. Tingling and itching on the face. Facial lesion showed p on Monday. Had a similar outbreak on face with only 1 lesion around mouth and face in the past.   The PMH, PSH, Social History, Family History, Medications, and allergies have been reviewed in [facility], and have been updated if relevant.  Review of Systems ROS: GEN: Acute illness details above GI: Tolerating PO intake GU: maintaining adequate hydration and urination Pulm: No SOB Interactive and getting along well at home.  Otherwise, ROS is as per the HPI.     Objective:   Physical Exam   Physical Exam  Blood pressure 120/72, pulse 90, temperature 98.4 F (36.9 C), temperature source Oral, height 5\' 5"  (1.651 m), weight 159 lb (72.122 kg), SpO2 98.00%.  GEN: WDWN, NAD, Non-toxic, A & O x 3 HEENT: Atraumatic, Normocephalic. Neck supple. No masses, No LAD. Ulcerated lesion on lower lip. Ears and Nose: No external deformity. EXTR: No c/c/e NEURO Normal gait.  PSYCH: Normally interactive. Conversant. Not depressed or anxious appearing.  Calm demeanor.        Assessment & Plan:   1. Shingles outbreak  valACYclovir (VALTREX) 1000 MG tablet   Discussed plan of treatment, infection risk with small children given she watches her 68 month old grandchild.

## 2010-12-30 ENCOUNTER — Ambulatory Visit: Payer: 59 | Admitting: Family Medicine

## 2011-02-04 LAB — ANAEROBIC CULTURE

## 2011-02-04 LAB — URINALYSIS, ROUTINE W REFLEX MICROSCOPIC
Bilirubin Urine: NEGATIVE
Ketones, ur: NEGATIVE
Nitrite: NEGATIVE
Protein, ur: NEGATIVE
Urobilinogen, UA: 0.2

## 2011-02-04 LAB — PROTIME-INR
INR: 0.9
Prothrombin Time: 12.4

## 2011-02-04 LAB — CBC
MCHC: 33.1
Platelets: 214
RBC: 4.83
WBC: 10

## 2011-02-16 ENCOUNTER — Other Ambulatory Visit (INDEPENDENT_AMBULATORY_CARE_PROVIDER_SITE_OTHER): Payer: 59

## 2011-02-16 DIAGNOSIS — E782 Mixed hyperlipidemia: Secondary | ICD-10-CM

## 2011-02-16 DIAGNOSIS — E538 Deficiency of other specified B group vitamins: Secondary | ICD-10-CM

## 2011-02-16 LAB — LIPID PANEL: VLDL: 44.6 mg/dL — ABNORMAL HIGH (ref 0.0–40.0)

## 2011-02-16 LAB — LDL CHOLESTEROL, DIRECT: Direct LDL: 212.1 mg/dL

## 2011-02-18 ENCOUNTER — Other Ambulatory Visit: Payer: Self-pay | Admitting: *Deleted

## 2011-02-18 ENCOUNTER — Encounter: Payer: Self-pay | Admitting: Family Medicine

## 2011-02-18 ENCOUNTER — Telehealth: Payer: Self-pay | Admitting: Family Medicine

## 2011-02-18 DIAGNOSIS — E782 Mixed hyperlipidemia: Secondary | ICD-10-CM

## 2011-02-18 NOTE — Telephone Encounter (Signed)
Message copied by Excell Seltzer on Thu Feb 18, 2011  8:28 AM ------      Message from: Consuello Masse      Created: Wed Feb 17, 2011  1:03 PM       Patient says that she is open to red yeast rice. She was unable to do the zocor, She is taken 2000mg  daily of the fish oil

## 2011-02-18 NOTE — Telephone Encounter (Signed)
Start red yeast rice 2400 mg divided daily, increase fish oil to 3000 mg daily.. Make changes in med list please Heather. We will recheck cholesterol in 3 months.

## 2011-02-22 LAB — CBC
HCT: 38.2
Hemoglobin: 12.7
Platelets: 191
Platelets: 205
RBC: 3.9
WBC: 6.2
WBC: 7.6

## 2011-02-22 LAB — COMPREHENSIVE METABOLIC PANEL
Albumin: 3.3 — ABNORMAL LOW
Alkaline Phosphatase: 110
BUN: 11
Chloride: 101
Potassium: 2.8 — ABNORMAL LOW
Total Bilirubin: 0.7

## 2011-02-22 LAB — DIFFERENTIAL
Basophils Absolute: 0
Basophils Relative: 0
Eosinophils Absolute: 0.2
Eosinophils Relative: 2
Monocytes Absolute: 0.6
Neutro Abs: 5.4

## 2011-02-22 LAB — BASIC METABOLIC PANEL
BUN: 6
Calcium: 8.6
Calcium: 8.8
Creatinine, Ser: 0.68
Creatinine, Ser: 0.79
GFR calc Af Amer: >60
GFR calc non Af Amer: >60
Potassium: 4.1

## 2011-05-21 ENCOUNTER — Other Ambulatory Visit (INDEPENDENT_AMBULATORY_CARE_PROVIDER_SITE_OTHER): Payer: 59

## 2011-05-21 DIAGNOSIS — E782 Mixed hyperlipidemia: Secondary | ICD-10-CM

## 2011-05-21 LAB — LIPID PANEL
Cholesterol: 286 mg/dL — ABNORMAL HIGH (ref 0–200)
Total CHOL/HDL Ratio: 8

## 2011-05-21 LAB — COMPREHENSIVE METABOLIC PANEL
ALT: 46 U/L — ABNORMAL HIGH (ref 0–35)
Albumin: 3.6 g/dL (ref 3.5–5.2)
Alkaline Phosphatase: 119 U/L — ABNORMAL HIGH (ref 39–117)
CO2: 29 mEq/L (ref 19–32)
GFR: 94.36 mL/min (ref >60.00)
Glucose, Bld: 92 mg/dL (ref 70–99)
Potassium: 3.8 mEq/L (ref 3.5–5.1)
Sodium: 140 mEq/L (ref 135–145)
Total Protein: 7 g/dL (ref 6.0–8.3)

## 2011-06-10 ENCOUNTER — Telehealth: Payer: Self-pay

## 2011-06-10 NOTE — Telephone Encounter (Signed)
Before trying different med.. I would recommend trying low dose simvastatin.. like 10-20 mg daily.. Is she agreeable to trying lower dose?

## 2011-06-10 NOTE — Telephone Encounter (Signed)
Pt said Simvastatin caused muscles to ache especially in upper arms. Pt cancelled appt 06/11/11 due to weather forecast and request Dr Ermalene Searing to start her on another cholesterol medication. Pt uses [facility] and pt can be reached at [phone] after Dr Ermalene Searing makes a decision.

## 2011-06-11 ENCOUNTER — Ambulatory Visit: Payer: 59 | Admitting: Family Medicine

## 2011-06-14 NOTE — Telephone Encounter (Signed)
Patient wants to know if any other medication is more effective then simvastatin b/c her labs dont really seem to be getting better

## 2011-06-14 NOTE — Telephone Encounter (Signed)
Patient okay with crestor send to [facility]

## 2011-06-14 NOTE — Telephone Encounter (Signed)
There are stronger meds like crestor...generally the stronger the more SE though.  We can try low dose crestor though if she would like.

## 2011-06-17 ENCOUNTER — Ambulatory Visit (INDEPENDENT_AMBULATORY_CARE_PROVIDER_SITE_OTHER): Payer: 59 | Admitting: Family Medicine

## 2011-06-17 ENCOUNTER — Encounter: Payer: Self-pay | Admitting: Family Medicine

## 2011-06-17 VITALS — BP 160/90 | HR 75 | Temp 97.8°F | Wt 151.5 lb

## 2011-06-17 DIAGNOSIS — F4389 Other reactions to severe stress: Secondary | ICD-10-CM

## 2011-06-17 DIAGNOSIS — E782 Mixed hyperlipidemia: Secondary | ICD-10-CM

## 2011-06-17 DIAGNOSIS — F438 Other reactions to severe stress: Secondary | ICD-10-CM

## 2011-06-17 MED ORDER — CITALOPRAM HYDROBROMIDE 10 MG PO TABS: 10.0000 mg | ORAL_TABLET | Freq: Every day | ORAL | Status: DC

## 2011-06-17 MED ORDER — ALPRAZOLAM 0.25 MG PO TABS: 0.2500 mg | ORAL_TABLET | Freq: Two times a day (BID) | ORAL | Status: DC | PRN

## 2011-06-17 NOTE — Progress Notes (Signed)
Subjective:    Patient ID: Michelle Donovan, female    DOB: 09-25-1945, 66 y.o.   MRN: [id number]  HPI 66 yo pt of Dr. Ermalene Searing, new to me here to discuss.  1. Elevated Cholesterol: very high ldl Lab Results  Component Value Date   CHOL 286* 05/21/2011   HDL 37.00* 05/21/2011   LDLCALC 108* 08/26/2009   LDLDIRECT 210.2 05/21/2011   TRIG 179.0* 05/21/2011   CHOLHDL 8 05/21/2011   Notes reviewed, Dr. Ermalene Searing has tried several different meds.  zocor caused myalgias, tried lower dose but LDL still very high. Dr. Ermalene Searing called in rx for crestor last week although not on med list. ?  Has not started it yet. Pt no longer wants to discuss this although she did originally.  Anxiety- Having panic attacks. First noticed them when she stopped taking her estrace, restarted it and feels little better but still very irritable, especially at work. Not tearful or sad, just feels "snappy."  Michelle Donovan has cancer and so work environment has been more stressful. Sleeping ok. Appetite good. No SI or HI.  Patient Active Problem List  Diagnoses  . UNSPECIFIED VITAMIN D DEFICIENCY  . HYPERLIPIDEMIA  . ANXIETY, SITUATIONAL  . TREMOR, ESSENTIAL  . HYPERTENSION  . ALLERGIC RHINITIS  . ASTHMA  . PULMONARY NODULE, SOLITARY  . IRRITABLE BOWEL SYNDROME  . LOW BACK PAIN, CHRONIC  . Myalgia  . B12 deficiency  . Weight loss  . Elbow pain   Past Medical History  Diagnosis Date  . Dermatophytosis of scalp and beard   . Mixed hyperlipidemia   . Lumbago     bulging disk per MRI   . Irritable bowel syndrome   . Essential and other specified forms of tremor   . Allergic rhinitis, cause unspecified   . Unspecified asthma   . HTN (hypertension)   . Other acute reactions to stress   . Other diseases of lung, not elsewhere classified     solitary pulm. nodule(left)  . Unspecified vitamin D deficiency   . Diverticulosis    Past Surgical History  Procedure Date  . Laparoscopic total hysterectomy 2002  . Tubal ligation  1986  . Tonsillectomy 1967/68   History  Substance Use Topics  . Smoking status: Former Smoker    Quit date: 05/17/1980  . Smokeless tobacco: Not on file  . Alcohol Use: No   Family History  Problem Relation Age of Onset  . Other Father     Trigeminal neuralgia  . Stroke Mother   . Tremor Mother   . Diabetes Mother   . Heart failure Mother   . Diabetes Sister   . Coronary artery disease Sister   . Irritable bowel syndrome Sister   . Tremor Sister   . Diabetes Brother     x 3  . Coronary artery disease Brother     x 3  . Irritable bowel syndrome Brother     x 3  . Tremor Brother     x 3  . Breast cancer      Aunts and cousin  . Colon cancer Maternal Grandfather    Allergies  Allergen Reactions  . Codeine     REACTION: vomiting   Current Outpatient Prescriptions on File Prior to Visit  Medication Sig Dispense Refill  . Cholecalciferol (VITAMIN D) 400 UNITS capsule Take 1,000 Units by mouth daily.       . diclofenac (VOLTAREN) 75 MG EC tablet Take 1 tablet (75 mg total) by  mouth 2 (two) times daily.  30 tablet  0  . estradiol (ESTRACE) 0.5 MG tablet TAKE 1 TABLET BY MOUTH ONCE A DAY  90 tablet  3  . fish oil-omega-3 fatty acids 1000 MG capsule Take 3 g by mouth daily.        . fluticasone (FLONASE) 50 MCG/ACT nasal spray Place 2 sprays into the nose daily as needed.       . loratadine (CLARITIN) 10 MG tablet Take 10 mg by mouth daily as needed.        Marland Kitchen SINGULAIR 10 MG tablet TAKE 1 TAB BY MOUTH AT BEDTIME  30 tablet  10  . valACYclovir (VALTREX) 1000 MG tablet Take 1 tablet (1,000 mg total) by mouth 3 (three) times daily.  21 tablet  0  . vitamin B-12 (CYANOCOBALAMIN) 1000 MCG tablet Take 1,000 mcg by mouth 2 (two) times daily.         The PMH, PSH, Social History, Family History, Medications, and allergies have been reviewed in [facility], and have been updated if relevant.    Review of Systems   See HPI     Objective:   Physical Exam  BP 160/90  Pulse 75   Temp(Src) 97.8 F (36.6 C) (Oral)  Wt 151 lb 8 oz (68.72 kg) BP Readings from Last 3 Encounters:  06/17/11 160/90  12/24/10 120/72  11/27/10 130/70    Gen:  Alert, NAD Psych:  Anxious appearing, good eye contact, appropriate      Assessment & Plan:   1. ANXIETY, SITUATIONAL   >25 min spent with face to face with patient, >50% counseling and/or coordinating care. Start Celexa 10 mg daily with as needed xanax. See pt instructions for details.

## 2011-06-17 NOTE — Patient Instructions (Addendum)
Please follow up with [place] in 1 month. Please check your blood pressure at home.  Hopefully elevated today due to anxiety.  Citalopram tablets What is this medicine? CITALOPRAM (sye TAL oh pram) is a medicine for depression. This medicine may be used for other purposes; ask your health care provider or pharmacist if you have questions. What should I tell my health care provider before I take this medicine? They need to know if you have any of these conditions: -bipolar disorder or a family history of bipolar disorder -diabetes -heart disease -history of irregular heartbeat -kidney or liver disease -low levels of magnesium or potassium in the blood -receiving electroconvulsive therapy -seizures (convulsions) -suicidal thoughts or a previous suicide attempt -an unusual or allergic reaction to citalopram, escitalopram, other medicines, foods, dyes, or preservatives -pregnant or trying to become pregnant -breast-feeding How should I use this medicine? Take this medicine by mouth with a glass of water. Follow the directions on the prescription label. You can take it with or without food. Take your medicine at regular intervals. Do not take your medicine more often than directed. Do not stop taking except on your doctor's advice. A special MedGuide will be given to you by the pharmacist with each prescription and refill. Be sure to read this information carefully each time. Talk to your pediatrician regarding the use of this medicine in children. Special care may be needed. Patients over 60 years old may have a stronger reaction and need a smaller dose. Overdosage: If you think you have taken too much of this medicine contact a poison control center or emergency room at once. NOTE: This medicine is only for you. Do not share this medicine with others. What if I miss a dose? If you miss a dose, take it as soon as you can. If it is almost time for your next dose, take only that dose. Do not take  double or extra doses. What may interact with this medicine? Do not take this medicine with any of the following medications: -certain diet drugs like dexfenfluramine, fenfluramine, phentermine, sibutramine -cisapride -escitalopram -linezolid -MAOIs like Carbex, Eldepryl, Marplan, Nardil, and Parnate -methylene blue -pimozide -procarbazine -tryptophan This medicine may also interact with the following medications: -amphetamine or dextroamphetamine -arsenic trioxide -aspirin and aspirin-like drugs -carbamazepine -certain medicines for fungal infections like ketoconazole and itraconazole -certain medicines used to treat infections like chloroquine, clarithromycin, erythromycin, pentamidine -certain medicines for irregular heart beat like amiodarone, disopyramide, dofetilide, ibutilide, procainamide, propafenone, quinidine, sotalol -cimetidine -lithium -medicines for depression, anxiety, or psychotic disturbances -medicines for migraine headache like almotriptan, eletriptan, frovatriptan, naratriptan, rizatriptan, sumatriptan, zolmitriptan -medicines that treat or prevent blood clots like warfarin, enoxaparin, and dalteparin -medicines that treat HIV infection or AIDS -methadone -metoprolol -NSAIDs, medicines for pain and inflammation, like ibuprofen or naproxen -omeprazole -posaconazole -St. John's wort -tramadol This list may not describe all possible interactions. Give your health care provider a list of all the medicines, herbs, non-prescription drugs, or dietary supplements you use. Also tell them if you smoke, drink alcohol, or use illegal drugs. Some items may interact with your medicine. What should I watch for while using this medicine? Visit your doctor or health care professional for regular checks on your progress. Continue to take your medicine even if you do not feel better right away. It can take about 4 weeks before you feel the full effect of this medicine. Patients  and their families should watch out for depression or thoughts of suicide that get worse. Also watch  out for sudden or severe changes in feelings such as feeling anxious, agitated, panicky, irritable, hostile, aggressive, impulsive, severely restless, overly excited and hyperactive, or not being able to sleep. If this happens, especially at the beginning of antidepressant treatment or after a change in dose, call your health care professional. If you have been taking this medicine regularly for some time, do not suddenly stop taking it. You must gradually reduce the dose, or your symptoms may get worse. Ask your doctor or health care professional for advice. You may get drowsy or dizzy. Do not drive, use machinery, or do anything that needs mental alertness until you know how this medicine affects you. Do not stand or sit up quickly, especially if you are an older patient. This reduces the risk of dizzy or fainting spells. Alcohol may interfere with the effect of this medicine. Avoid alcoholic drinks. Do not treat yourself for coughs, colds, or allergies without asking your doctor or health care professional for advice. Some ingredients can increase possible side effects. Your mouth may get dry. Chewing sugarless gum or sucking hard candy, and drinking plenty of water will help. What side effects may I notice from receiving this medicine? Side effects that you should report to your doctor or health care professional as soon as possible: -allergic reactions like skin rash, itching or hives, swelling of the face, lips, or tongue -chest pain -confusion -dizziness -fast, irregular heartbeat -fast talking and excited feelings or actions that are out of control -feeling faint or lightheaded, falls -hallucination, loss of contact with reality -seizures -shortness of breath -suicidal thoughts or other mood changes -unusual bleeding or bruising Side effects that usually do not require medical attention  (report to your doctor or health care professional if they continue or are bothersome): -blurred vision -change in appetite -change in sex drive or performance -headache -increased sweating -nausea -trouble sleeping This list may not describe all possible side effects. Call your doctor for medical advice about side effects. You may report side effects to FDA at 1-800-FDA-1088. Where should I keep my medicine? Keep out of reach of children. Store at room temperature between 15 and 30 degrees C (59 and 86 degrees F). Throw away any unused medicine after the expiration date. NOTE: This sheet is a summary. It may not cover all possible information. If you have questions about this medicine, talk to your doctor, pharmacist, or health care provider.  2012, Elsevier/Gold Standard. (01/09/2010 10:48:06 AM)

## 2011-06-18 ENCOUNTER — Telehealth: Payer: Self-pay | Admitting: Family Medicine

## 2011-06-18 MED ORDER — ROSUVASTATIN CALCIUM 10 MG PO TABS: 10.0000 mg | ORAL_TABLET | Freq: Every day | ORAL | Status: DC

## 2011-06-18 NOTE — Telephone Encounter (Signed)
Make sure pt has labs scheduled for recheck chol (lipids, CMET) in 3 months.

## 2011-06-21 NOTE — Telephone Encounter (Signed)
Patient advised.

## 2011-07-06 ENCOUNTER — Telehealth: Payer: Self-pay | Admitting: Family Medicine

## 2011-07-06 NOTE — Telephone Encounter (Signed)
Triage Record Num: [id number] Operator: Peri Jefferson Patient Name: Michelle Donovan Call Date & Time: 07/06/2011 1:46:08PM Patient Phone: [phone] PCP: Kerby Nora Patient Gender: Female PCP Fax : [phone] Patient DOB: Jul 15, 1945 Practice Name[facility] Reason for Call: Caller: [name]/Patient; PCP: Excell Seltzer.; CB#: [phone]; Call regarding Lump; [name] noticed a lump above her left ankle on 06/22/11. States that it is under the skin. Tender to touch and feels like it is "pulling" when she walks. Intermittent skin redness. Afebrile. Utilized Swollen Lymph Node Guideline. See PCP within 4 hrs disposition. No appts available today. Per Lyla Son, advise ED/UC. Informed pt. Pt refuses ED/UC and requests appt for tomorrow. Scheduled appt for tomorrow (07/07/11) @ 1230 with Dr. Milinda Antis. Protocol(s) Used: Swollen Lymph Nodes Recommended Outcome per Protocol: See Provider within 4 hours Reason for Outcome: Swollen glands AND any new symptoms of localized infection (redness, tenderness, warm to touch, increasing in size, etc.) Care Advice: ~ 07/06/2011 2:02:51PM Page 1 of 1 [email or web address]

## 2011-07-07 ENCOUNTER — Encounter: Payer: Self-pay | Admitting: Family Medicine

## 2011-07-07 ENCOUNTER — Ambulatory Visit (INDEPENDENT_AMBULATORY_CARE_PROVIDER_SITE_OTHER): Payer: 59 | Admitting: Family Medicine

## 2011-07-07 VITALS — BP 150/80 | HR 68 | Temp 97.7°F | Wt 149.5 lb

## 2011-07-07 DIAGNOSIS — M7989 Other specified soft tissue disorders: Secondary | ICD-10-CM

## 2011-07-07 DIAGNOSIS — I8390 Asymptomatic varicose veins of unspecified lower extremity: Secondary | ICD-10-CM

## 2011-07-07 NOTE — Assessment & Plan Note (Signed)
See assessment for leg swelling -- focal area of pain / suspect phlebitis Getting venous duplex Also spider veins diffusely on feet Disc imp of compression stockings

## 2011-07-07 NOTE — Patient Instructions (Signed)
We will refer you for venous ultrasound of leg at check out  Elevate leg when able Use warm compress  Switch to stockings with some support/ compression  Update if not starting to improve in a week or if worsening   We will call with results of the test

## 2011-07-07 NOTE — Progress Notes (Signed)
Subjective:    Patient ID: Michelle Donovan, female    DOB: 09-20-1945, 66 y.o.   MRN: [id number]  HPI Has a lump on side of L leg Has been there for a few weeks  Started with some soreness - thought perhaps she had injured it  No bruise ever came up  No rash  Gets a little red sometimes- faint   No insect bites  No fever  Has varicose veins  Mostly spider veins in ankles- occ sore  No ulcers Does not wear support stockings  Patient Active Problem List  Diagnoses  . UNSPECIFIED VITAMIN D DEFICIENCY  . HYPERLIPIDEMIA  . ANXIETY, SITUATIONAL  . TREMOR, ESSENTIAL  . HYPERTENSION  . ALLERGIC RHINITIS  . ASTHMA  . PULMONARY NODULE, SOLITARY  . IRRITABLE BOWEL SYNDROME  . LOW BACK PAIN, CHRONIC  . Myalgia  . B12 deficiency  . Weight loss  . Elbow pain  . Leg swelling  . Varicosities of leg   Past Medical History  Diagnosis Date  . Dermatophytosis of scalp and beard   . Mixed hyperlipidemia   . Lumbago     bulging disk per MRI   . Irritable bowel syndrome   . Essential and other specified forms of tremor   . Allergic rhinitis, cause unspecified   . Unspecified asthma   . HTN (hypertension)   . Other acute reactions to stress   . Other diseases of lung, not elsewhere classified     solitary pulm. nodule(left)  . Unspecified vitamin D deficiency   . Diverticulosis    Past Surgical History  Procedure Date  . Laparoscopic total hysterectomy 2002  . Tubal ligation 1986  . Tonsillectomy 1967/68   History  Substance Use Topics  . Smoking status: Former Smoker    Quit date: 05/17/1980  . Smokeless tobacco: Not on file  . Alcohol Use: No   Family History  Problem Relation Age of Onset  . Other Father     Trigeminal neuralgia  . Stroke Mother   . Tremor Mother   . Diabetes Mother   . Heart failure Mother   . Diabetes Sister   . Coronary artery disease Sister   . Irritable bowel syndrome Sister   . Tremor Sister   . Diabetes Brother     x 3  . Coronary  artery disease Brother     x 3  . Irritable bowel syndrome Brother     x 3  . Tremor Brother     x 3  . Breast cancer      Aunts and cousin  . Colon cancer Maternal Grandfather    Allergies  Allergen Reactions  . Codeine     REACTION: vomiting   Current Outpatient Prescriptions on File Prior to Visit  Medication Sig Dispense Refill  . ALPRAZolam (XANAX) 0.25 MG tablet Take 1 tablet (0.25 mg total) by mouth 2 (two) times daily as needed for anxiety.  30 tablet  0  . Cholecalciferol (VITAMIN D) 400 UNITS capsule Take 1,000 Units by mouth daily.       Marland Kitchen estradiol (ESTRACE) 0.5 MG tablet TAKE 1 TABLET BY MOUTH ONCE A DAY  90 tablet  3  . fish oil-omega-3 fatty acids 1000 MG capsule Take 3 g by mouth daily.        . fluticasone (FLONASE) 50 MCG/ACT nasal spray Place 2 sprays into the nose daily as needed.       . loratadine (CLARITIN) 10 MG tablet Take  10 mg by mouth daily as needed.        . rosuvastatin (CRESTOR) 10 MG tablet Take 1 tablet (10 mg total) by mouth daily.  30 tablet  11  . SINGULAIR 10 MG tablet TAKE 1 TAB BY MOUTH AT BEDTIME  30 tablet  10  . vitamin B-12 (CYANOCOBALAMIN) 1000 MCG tablet Take 1,000 mcg by mouth 2 (two) times daily.        . diclofenac (VOLTAREN) 75 MG EC tablet Take 1 tablet (75 mg total) by mouth 2 (two) times daily.  30 tablet  0  . valACYclovir (VALTREX) 1000 MG tablet Take 1 tablet (1,000 mg total) by mouth 3 (three) times daily.  21 tablet  0      Review of Systems Review of Systems  Constitutional: Negative for fever, appetite change, fatigue and unexpected weight change.  Eyes: Negative for pain and visual disturbance.  Respiratory: Negative for cough and shortness of breath.   Cardiovascular: Negative for cp or palpitations    Gastrointestinal: Negative for nausea, diarrhea and constipation.  Genitourinary: Negative for urgency and frequency.  Skin: Negative for pallor or rash   Neurological: Negative for weakness, light-headedness,  numbness and headaches.  Hematological: Negative for adenopathy. Does not bruise/bleed easily.  Psychiatric/Behavioral: Negative for dysphoric mood. The patient is not nervous/anxious.         Objective:   Physical Exam  Constitutional: She is oriented to person, place, and time. She appears well-developed and well-nourished. No distress.  HENT:  Head: Normocephalic and atraumatic.  Mouth/Throat: Oropharynx is clear and moist.  Eyes: Conjunctivae and EOM are normal. Pupils are equal, round, and reactive to light. No scleral icterus.  Neck: Normal range of motion. Neck supple. No JVD present. No thyromegaly present.  Cardiovascular: Normal rate, regular rhythm, normal heart sounds and intact distal pulses.  Exam reveals no gallop.   Pulmonary/Chest: Effort normal and breath sounds normal. No respiratory distress. She has no wheezes.  Abdominal: Soft. Bowel sounds are normal. She exhibits no distension and no mass. There is no tenderness.  Musculoskeletal: Normal range of motion. She exhibits edema and tenderness.  Lymphadenopathy:    She has no cervical adenopathy.  Neurological: She is alert and oriented to person, place, and time. She has normal reflexes. No cranial nerve deficit. She exhibits normal muscle tone. Coordination normal.       L medial leg just below the calf has tender 2 cm area of compressible swelling without palp cord or redness or heat  Is slt tender Consistent with area of phlebitis Diffuse spider veins bilat ankles Few larger varicosities in lower legs Nl gait No bony tenderness  Neg homann's sign  Skin: Skin is warm and dry. No rash noted. No erythema. No pallor.  Psychiatric: She has a normal mood and affect.          Assessment & Plan:

## 2011-07-07 NOTE — Assessment & Plan Note (Signed)
Focal area of swelling on medial L leg under calf with exam consistent with phlebitis  Is tender/ slt red  No palp cords or homan's sign Will get venous doppler Disc elevation/ heat/ supp hose (otc to start) Pend result- then adv further

## 2011-07-16 ENCOUNTER — Telehealth: Payer: Self-pay | Admitting: Family Medicine

## 2011-07-16 NOTE — Telephone Encounter (Signed)
Triage Record Num: [id number] Operator: Caswell Corwin Patient Name: Michelle Donovan Call Date & Time: 07/16/2011 1:29:19PM Patient Phone: [phone] PCP: Kerby Nora Patient Gender: Female PCP Fax : [phone] Patient DOB: June 25, 1945 Practice Name[facility] Reason for Call: Caller: Claude/Patient; PCP: Excell Seltzer.; CB#: [phone]; ; ; Call regarding Cough/Congestion that started 07/12/11. Afebrile. Worst SX now is ear congestion and has bad H/A. Triaged URI and has been taking Ibuprofen and it helps "for a while". H/A started 07/15/11. Needs to be seen in 24 hrs. but does not want to come in. Home care and call back inst given. Protocol(s) Used: Upper Respiratory Infection (URI) Recommended Outcome per Protocol: See Provider within 24 hours Reason for Outcome: Mild to moderate headache for more than 24 hours unrelieved with nonprescription medications Care Advice: ~ Use a cool mist humidifier to moisten air. Be sure to clean according to manufacturer's instructions. Call provider if headache worsens, develops temperature greater than 101.20F (38.1C) or any temperature elevation in a geriatric or immunocompromised patient (such as diabetes, HIV/AIDS, chemotherapy, organ transplant, or chronic steroid use). ~ ~ SYMPTOM / CONDITION MANAGEMENT A warm, moist compress placed on face, over eyes for 15 to 20 minutes, 5 to 6 times a day, may help relieve the congestion. ~ Most adults need to drink 6-10 eight-ounce glasses (1.2-2.0 liters) of fluids per day unless previously told to limit fluid intake for other medical reasons. Limit fluids that contain caffeine, sugar or alcohol. Urine will be a very light yellow color when you drink enough fluids. ~ Analgesic/Antipyretic Advice - Acetaminophen: Consider acetaminophen as directed on label or by pharmacist/provider for pain or fever. PRECAUTIONS: - Use only if there is no history of liver disease, alcoholism, or intake of three  or more alcohol drinks per day. - If approved by provider when breastfeeding. - Do not exceed recommended dose or frequency. ~ 07/16/2011 1:42:46PM Page 1 of 1 [email or web address]

## 2011-07-16 NOTE — Telephone Encounter (Signed)
  Caller: Airyana/Patient; PCP: Kerby Nora E.; CB#: [phone]; ; ; Call regarding Cough/Congestion that started 07/12/11. Afebrile. Worst SX now is ear congestion and has  bad H/A.  Triaged URI and has been taking Ibuprofen and it helps "for a while".  H/A started 07/15/11.  Needs to be seen in 24 hrs. but does not want to come in.  Home care and call back inst given.

## 2011-07-29 ENCOUNTER — Encounter (INDEPENDENT_AMBULATORY_CARE_PROVIDER_SITE_OTHER): Payer: 59

## 2011-07-29 ENCOUNTER — Telehealth: Payer: Self-pay | Admitting: *Deleted

## 2011-07-29 DIAGNOSIS — R229 Localized swelling, mass and lump, unspecified: Secondary | ICD-10-CM

## 2011-07-29 DIAGNOSIS — I8390 Asymptomatic varicose veins of unspecified lower extremity: Secondary | ICD-10-CM

## 2011-07-29 DIAGNOSIS — M79609 Pain in unspecified limb: Secondary | ICD-10-CM

## 2011-07-29 DIAGNOSIS — M7989 Other specified soft tissue disorders: Secondary | ICD-10-CM

## 2011-07-29 NOTE — Telephone Encounter (Signed)
Michelle Donovan from [facility] called to give the negative report for patient venous doppler today.

## 2011-07-29 NOTE — Telephone Encounter (Signed)
Thanks- please let pt know no clot Let me know if symptoms are not improving on leg

## 2011-07-30 NOTE — Telephone Encounter (Signed)
Patient notified as instructed by telephone. Pt said after the test yesterday the leg is more sore, but prior to the test was not bad, Pt will keep Dr Milinda Antis updated.

## 2011-07-30 NOTE — Telephone Encounter (Signed)
Thanks- that sounds good 

## 2011-08-02 ENCOUNTER — Telehealth: Payer: Self-pay | Admitting: Family Medicine

## 2011-08-02 NOTE — Telephone Encounter (Signed)
[facility] Triage Call Report Triage Record Num: [id number] Operator: Freddie Breech Patient Name: Michelle Donovan Call Date & Time: 08/02/2011 4:07:32PM Patient Phone: [phone] PCP: Kerby Nora Patient Gender: Female PCP Fax : [phone] Patient DOB: 1946/03/09 Practice Name: [facility] Reason for Call: Caller: [name]/Patient; PCP: Excell Seltzer.; CB#: [phone]; Call regarding Ear Congestion; Bil ear congestion, dry cough onset 07/30/11. Pt completed Amox on 07/27/11 prescribed for a URI/OM per UC but her era congestion did not resolve. She cannot take OTC decongestants. Appt sched for 08/03/11 @ 1600 with Dr. Para March. Protocol(s) Used: Ear: Symptoms Recommended Outcome per Protocol: Provide Home/Self Care Reason for Outcome: Ear fullness/pressure along with symptoms of a cold/upper respiratory infection or diagnosed seasonal allergies Care Advice: ~ Call provider if symptoms do not respond to home care or symptoms interfere with sleep or normal activities. ~ SYMPTOM / CONDITION MANAGEMENT Consider nonprescription decongestant (Sudafed, Drixoral) for relief of symptoms after checking with a provider, especially if there is a history of hypertension, hyperthyroidism, heart disease, diabetes, glaucoma, urinary retention caused by prostatic hypertrophy. ~ 03/

## 2011-08-03 ENCOUNTER — Encounter: Payer: Self-pay | Admitting: Family Medicine

## 2011-08-03 ENCOUNTER — Ambulatory Visit (INDEPENDENT_AMBULATORY_CARE_PROVIDER_SITE_OTHER): Payer: 59 | Admitting: Family Medicine

## 2011-08-03 VITALS — BP 146/80 | HR 81 | Temp 98.4°F | Wt 153.8 lb

## 2011-08-03 DIAGNOSIS — H6980 Other specified disorders of Eustachian tube, unspecified ear: Secondary | ICD-10-CM

## 2011-08-03 NOTE — Telephone Encounter (Signed)
Noted  

## 2011-08-03 NOTE — Progress Notes (Signed)
She went to UC with URI sx and R ear congestion (07/18/11).  Had 10 days of amoxil, felt better except for R ear- it never cleared.  Cough started back over the last few days, but is better today.  No known fevers.  No vomiting.  No diarrhea.  Prev with ST, better now.  Rhinorrhea prev and currently, but this is common for patient.  She is congested some now.  L ear feels okay.  Hearing is affected on R side.    Hadn't been using flonase consistently.  Had used nasal saline intermittently.    GEN: nad, alert and oriented HEENT: mucous membranes moist, tm w/o erythema, nasal exam w/o erythema, clear discharge noted,  OP with cobblestoning, sinuses not ttp.  No TM movement on valsalva B.  NECK: supple w/o LA CV: rrr.   PULM: ctab, no inc wob EXT: no edema SKIN: no acute rash

## 2011-08-03 NOTE — Patient Instructions (Signed)
Gently try to pop your ears and use the flonase and nasal saline daily.  This should get better.

## 2011-08-04 DIAGNOSIS — H6980 Other specified disorders of Eustachian tube, unspecified ear: Secondary | ICD-10-CM | POA: Insufficient documentation

## 2011-08-04 NOTE — Assessment & Plan Note (Signed)
Path/phys d/w pt.  Use gentle valsalva, nasal saline and restart nasal steroid.  No indication for abx.  F/u prn.  Nontoxic.  She agrees.

## 2011-09-08 ENCOUNTER — Ambulatory Visit (INDEPENDENT_AMBULATORY_CARE_PROVIDER_SITE_OTHER): Payer: 59 | Admitting: Family Medicine

## 2011-09-08 ENCOUNTER — Encounter: Payer: Self-pay | Admitting: Family Medicine

## 2011-09-08 VITALS — BP 140/64 | HR 74 | Temp 97.6°F | Ht 65.0 in | Wt 152.5 lb

## 2011-09-08 DIAGNOSIS — L989 Disorder of the skin and subcutaneous tissue, unspecified: Secondary | ICD-10-CM | POA: Insufficient documentation

## 2011-09-08 NOTE — Assessment & Plan Note (Signed)
Painful with standing  Originally suspected phlebitis (had nl doppler)  Now is more superficial - looks to be skin lesion  Hx of sun exposure also  Will ref to derm for eval

## 2011-09-08 NOTE — Patient Instructions (Signed)
We will refer you to dermatology for spot on leg at check out  If this becomes bigger/ red/ or has drainage - call and let me know  Keep it clean  Wear your sun protection

## 2011-09-08 NOTE — Progress Notes (Signed)
Subjective:    Patient ID: Michelle Donovan, female    DOB: Aug 31, 1945, 66 y.o.   MRN: [id number]  HPI Has a spot on L leg - has had problems with phlebitis before  Is on L inner leg -may be getting bigger Is the same spot  Got better and then worse- in terms of pain  Had venous duplex - no clot or phlebitis/ inflammation noted  Worse when on her feet It hurts non stop   Does not wear supp stockings  Does not have any   Is pink in color and slt raised She [place] with lot of sun exposure   Patient Active Problem List  Diagnoses  . UNSPECIFIED VITAMIN D DEFICIENCY  . HYPERLIPIDEMIA  . ANXIETY, SITUATIONAL  . TREMOR, ESSENTIAL  . HYPERTENSION  . ALLERGIC RHINITIS  . ASTHMA  . PULMONARY NODULE, SOLITARY  . IRRITABLE BOWEL SYNDROME  . LOW BACK PAIN, CHRONIC  . Myalgia  . B12 deficiency  . Weight loss  . Elbow pain  . Leg swelling  . Varicosities of leg  . ETD (eustachian tube dysfunction)  . Skin lesion of left leg   Past Medical History  Diagnosis Date  . Dermatophytosis of scalp and beard   . Mixed hyperlipidemia   . Lumbago     bulging disk per MRI   . Irritable bowel syndrome   . Essential and other specified forms of tremor   . Allergic rhinitis, cause unspecified   . Unspecified asthma   . HTN (hypertension)   . Other acute reactions to stress   . Other diseases of lung, not elsewhere classified     solitary pulm. nodule(left)  . Unspecified vitamin D deficiency   . Diverticulosis    Past Surgical History  Procedure Date  . Laparoscopic total hysterectomy 2002  . Tubal ligation 1986  . Tonsillectomy 1967/68   History  Substance Use Topics  . Smoking status: Former Smoker    Quit date: 05/17/1980  . Smokeless tobacco: Not on file  . Alcohol Use: No   Family History  Problem Relation Age of Onset  . Other Father     Trigeminal neuralgia  . Stroke Mother   . Tremor Mother   . Diabetes Mother   . Heart failure Mother   . Diabetes  Sister   . Coronary artery disease Sister   . Irritable bowel syndrome Sister   . Tremor Sister   . Diabetes Brother     x 3  . Coronary artery disease Brother     x 3  . Irritable bowel syndrome Brother     x 3  . Tremor Brother     x 3  . Breast cancer      Aunts and cousin  . Colon cancer Maternal Grandfather    Allergies  Allergen Reactions  . Codeine     REACTION: vomiting   Current Outpatient Prescriptions on File Prior to Visit  Medication Sig Dispense Refill  . Bioflavonoid Products (ESTER-C) 1000-50 MG TABS Take 1 tablet by mouth daily.      . Cholecalciferol (VITAMIN D) 400 UNITS capsule Take 1,000 Units by mouth daily.       Marland Kitchen estradiol (ESTRACE) 0.5 MG tablet TAKE 1 TABLET BY MOUTH ONCE A DAY  90 tablet  3  . fish oil-omega-3 fatty acids 1000 MG capsule Take 3 g by mouth daily.        . fluticasone (FLONASE) 50 MCG/ACT nasal spray  Place 2 sprays into the nose daily as needed.       . loratadine (CLARITIN) 10 MG tablet Take 10 mg by mouth daily as needed.        . rosuvastatin (CRESTOR) 10 MG tablet Take 1 tablet (10 mg total) by mouth daily.  30 tablet  11  . SINGULAIR 10 MG tablet TAKE 1 TAB BY MOUTH AT BEDTIME  30 tablet  10  . vitamin B-12 (CYANOCOBALAMIN) 1000 MCG tablet Take 1,000 mcg by mouth 2 (two) times daily.            Review of Systems Review of Systems  Constitutional: Negative for fever, appetite change, fatigue and unexpected weight change.  Eyes: Negative for pain and visual disturbance.  Respiratory: Negative for cough and shortness of breath.   Cardiovascular: Negative for cp or palpitations    Gastrointestinal: Negative for nausea, diarrhea and constipation.  Genitourinary: Negative for urgency and frequency.  Skin: Negative for pallor or rash  pos for "knot" on leg and freckles Neurological: Negative for weakness, light-headedness, numbness and headaches.  Hematological: Negative for adenopathy. Does not bruise/bleed easily.    Psychiatric/Behavioral: Negative for dysphoric mood. The patient is not nervous/anxious.          Objective:   Physical Exam  Constitutional: She appears well-developed and well-nourished. No distress.  Neck: Normal range of motion. Neck supple.  Pulmonary/Chest: Effort normal and breath sounds normal.  Musculoskeletal:       No acute joint swelling or changes    Lymphadenopathy:    She has no cervical adenopathy.  Neurological: She is alert.  Skin: Skin is warm and dry. No rash noted. No pallor.       L lower medial leg  1-1.5 cm of induration with pink color that is superficial/ incompressible and tender Spider veins noted diffusely  lentigos diffusely   Psychiatric: She has a normal mood and affect.          Assessment & Plan:

## 2011-10-18 ENCOUNTER — Telehealth: Payer: Self-pay | Admitting: Family Medicine

## 2011-10-18 NOTE — Telephone Encounter (Signed)
Caller: [place]/Patient; PCP: Kerby Nora E.; CB#: [phone]; ; ; Call regarding Back Pain;  Michelle Donovan has hx of bulging disc and recurring lower back pain.  Pain started again on 10/16/11.  Pain is worse after sleeping all night.  Pain is intermittent during the day.  Requesting script for Cyclobenzaprine 10mg .  Has taken in the past from another MD.  States that it helped when she took it prior to going to bed.  Pain intensifies with cough.  Utilized Back Symptoms Guideline.  See PCP within 24 hrs disposition.  Michelle Donovan declines appt and requests note to be sent to PCP regarding prescription request.  Please f/u with Michelle Donovan.

## 2011-10-19 MED ORDER — CYCLOBENZAPRINE HCL 10 MG PO TABS: 10.0000 mg | ORAL_TABLET | Freq: Every day | ORAL | Status: AC

## 2011-10-19 NOTE — Telephone Encounter (Signed)
Patient advised.

## 2011-10-19 NOTE — Telephone Encounter (Signed)
Can try small amount of muscle relaxant... If not improving in next few days will need follow up for further eval and any refills.

## 2011-10-25 ENCOUNTER — Other Ambulatory Visit: Payer: Self-pay | Admitting: *Deleted

## 2011-10-25 MED ORDER — ESTRADIOL 0.5 MG PO TABS: 0.5000 mg | ORAL_TABLET | Freq: Every day | ORAL | Status: DC

## 2011-11-25 ENCOUNTER — Other Ambulatory Visit: Payer: Self-pay | Admitting: *Deleted

## 2011-11-25 MED ORDER — MONTELUKAST SODIUM 10 MG PO TABS: 10.0000 mg | ORAL_TABLET | Freq: Every day | ORAL | Status: DC

## 2011-12-28 ENCOUNTER — Encounter: Payer: Self-pay | Admitting: Family Medicine

## 2011-12-28 ENCOUNTER — Ambulatory Visit (INDEPENDENT_AMBULATORY_CARE_PROVIDER_SITE_OTHER): Payer: 59 | Admitting: Family Medicine

## 2011-12-28 VITALS — BP 128/70 | HR 72 | Temp 98.3°F | Wt 155.0 lb

## 2011-12-28 DIAGNOSIS — J4530 Mild persistent asthma, uncomplicated: Secondary | ICD-10-CM

## 2011-12-28 DIAGNOSIS — G250 Essential tremor: Secondary | ICD-10-CM

## 2011-12-28 DIAGNOSIS — L259 Unspecified contact dermatitis, unspecified cause: Secondary | ICD-10-CM

## 2011-12-28 DIAGNOSIS — J45909 Unspecified asthma, uncomplicated: Secondary | ICD-10-CM

## 2011-12-28 DIAGNOSIS — L309 Dermatitis, unspecified: Secondary | ICD-10-CM

## 2011-12-28 DIAGNOSIS — G252 Other specified forms of tremor: Secondary | ICD-10-CM

## 2011-12-28 MED ORDER — TRIAMCINOLONE ACETONIDE 0.5 % EX CREA: TOPICAL_CREAM | Freq: Two times a day (BID) | CUTANEOUS | Status: DC

## 2011-12-28 MED ORDER — METOPROLOL SUCCINATE ER 100 MG PO TB24: 100.0000 mg | ORAL_TABLET | Freq: Every day | ORAL | Status: DC

## 2011-12-28 NOTE — Assessment & Plan Note (Signed)
[place] dry, avoid irritants. Treat with topical steroid cream.

## 2011-12-28 NOTE — Progress Notes (Signed)
  Subjective:    Patient ID: Michelle Donovan, female    DOB: 09-11-1945, 66 y.o.   MRN: [id number]  HPI 66 year old female presents to diuscss several different issues.  Benign essential tremor: Tremor is gradually worsening and is bothering her a lot she has. BBlockers (atenolol and metoprolol) in past caused fatigue but she is willing to try again. Has tried neurontin, mysoline, topamax with SE or no relief   Rash on right palm, dry cracked.  No new exposures.  Cannot afford singulair for allergies and mild persistent asthma. Main symptom was dry cough.. ENT started singulair for "touch of asthma"   Review of Systems  Constitutional: Negative for fever and fatigue.  HENT: Negative for ear pain.   Eyes: Negative for pain.  Respiratory: Negative for chest tightness and shortness of breath.   Cardiovascular: Negative for chest pain, palpitations and leg swelling.  Gastrointestinal: Negative for abdominal pain.  Genitourinary: Negative for dysuria.       Objective:   Physical Exam  Constitutional: Vital signs are normal. She appears well-developed and well-nourished. She is cooperative.  Non-toxic appearance. She does not appear ill. No distress.  HENT:  Head: Normocephalic.  Right Ear: Hearing, tympanic membrane, external ear and ear canal normal. Tympanic membrane is not erythematous, not retracted and not bulging.  Left Ear: Hearing, tympanic membrane, external ear and ear canal normal. Tympanic membrane is not erythematous, not retracted and not bulging.  Nose: No mucosal edema or rhinorrhea. Right sinus exhibits no maxillary sinus tenderness and no frontal sinus tenderness. Left sinus exhibits no maxillary sinus tenderness and no frontal sinus tenderness.  Mouth/Throat: Uvula is midline, oropharynx is clear and moist and mucous membranes are normal.  Eyes: Conjunctivae, EOM and lids are normal. Pupils are equal, round, and reactive to light. No foreign bodies found.  Neck: Trachea  normal and normal range of motion. Neck supple. Carotid bruit is not present. No mass and no thyromegaly present.  Cardiovascular: Normal rate, regular rhythm, S1 normal, S2 normal, normal heart sounds, intact distal pulses and normal pulses.  Exam reveals no gallop and no friction rub.   No murmur heard. Pulmonary/Chest: Effort normal and breath sounds normal. Not tachypneic. No respiratory distress. She has no decreased breath sounds. She has no wheezes. She has no rhonchi. She has no rales.  Abdominal: Soft. Normal appearance and bowel sounds are normal. There is no tenderness.  Neurological: She is alert.       Tremor of hands and head  Skin: Skin is warm, dry and intact. No rash noted.       Flaky dry skin on right palm and in between fingers, n blisters, no pustules, no erythema.  Psychiatric: Her speech is normal and behavior is normal. Judgment and thought content normal. Her mood appears not anxious. Cognition and memory are normal. She does not exhibit a depressed mood.          Assessment & Plan:

## 2011-12-28 NOTE — Assessment & Plan Note (Signed)
Tremor is bothering her enough that she is willing to retry BBlocker at higher dose, toprol XL 100 mg daily. Follow BP and pulse.

## 2011-12-28 NOTE — Assessment & Plan Note (Signed)
Trial off singulair given cost If allergy symptoms return try instead of claritin.. Zyrtec and fluticasone nasal. If still not well controlled we can try to treat asthma with inhaler like advair or she can pay out of pocket costs for singulair.

## 2011-12-28 NOTE — Patient Instructions (Addendum)
Start metoprolol 100 mg daily for tremor.  Follow pulse.. Make sure not less than 60.   Can also follow BP with home cuff.. Stop metoprolol if feeling dizzy or lightheaded. Can try coming off singulair.. If symtoms.  Schedule CPX with fasting labs in next 1-2 months.

## 2012-02-03 ENCOUNTER — Other Ambulatory Visit: Payer: Self-pay

## 2012-02-03 MED ORDER — ESTRADIOL 0.5 MG PO TABS: 0.5000 mg | ORAL_TABLET | Freq: Every day | ORAL | Status: DC

## 2012-02-03 NOTE — Telephone Encounter (Signed)
[facility] E[facility]adiol 0.5 mg. Ok to refill?

## 2012-02-04 NOTE — Telephone Encounter (Signed)
[facility]stridol [facility].5 mg [facility]

## 2012-03-01 ENCOUNTER — Encounter: Payer: 59 | Admitting: Family Medicine

## 2012-03-14 ENCOUNTER — Encounter: Payer: Self-pay | Admitting: Family Medicine

## 2012-03-14 ENCOUNTER — Ambulatory Visit (INDEPENDENT_AMBULATORY_CARE_PROVIDER_SITE_OTHER): Payer: 59 | Admitting: Family Medicine

## 2012-03-14 ENCOUNTER — Other Ambulatory Visit: Payer: Self-pay | Admitting: Family Medicine

## 2012-03-14 VITALS — BP 116/68 | HR 60 | Temp 98.3°F | Ht 65.25 in | Wt 159.5 lb

## 2012-03-14 DIAGNOSIS — E538 Deficiency of other specified B group vitamins: Secondary | ICD-10-CM

## 2012-03-14 DIAGNOSIS — I10 Essential (primary) hypertension: Secondary | ICD-10-CM

## 2012-03-14 DIAGNOSIS — R21 Rash and other nonspecific skin eruption: Secondary | ICD-10-CM | POA: Insufficient documentation

## 2012-03-14 DIAGNOSIS — Z0000 Encounter for general adult medical examination without abnormal findings: Secondary | ICD-10-CM

## 2012-03-14 DIAGNOSIS — E782 Mixed hyperlipidemia: Secondary | ICD-10-CM

## 2012-03-14 DIAGNOSIS — Z1231 Encounter for screening mammogram for malignant neoplasm of breast: Secondary | ICD-10-CM

## 2012-03-14 DIAGNOSIS — E559 Vitamin D deficiency, unspecified: Secondary | ICD-10-CM

## 2012-03-14 LAB — LIPID PANEL
Cholesterol: 251 mg/dL — ABNORMAL HIGH (ref 0–200)
HDL: 42 mg/dL (ref >39.00)
Triglycerides: 187 mg/dL — ABNORMAL HIGH (ref 0.0–149.0)

## 2012-03-14 LAB — COMPREHENSIVE METABOLIC PANEL
Alkaline Phosphatase: 102 U/L (ref 39–117)
Creatinine, Ser: 0.8 mg/dL (ref 0.4–1.2)
GFR: 75.6 mL/min (ref >60.00)
Glucose, Bld: 94 mg/dL (ref 70–99)
Sodium: 137 mEq/L (ref 135–145)
Total Bilirubin: 0.3 mg/dL (ref 0.3–1.2)
Total Protein: 7.3 g/dL (ref 6.0–8.3)

## 2012-03-14 LAB — POCT WET PREP (WET MOUNT)

## 2012-03-14 LAB — LDL CHOLESTEROL, DIRECT: Direct LDL: 182.4 mg/dL

## 2012-03-14 MED ORDER — ROSUVASTATIN CALCIUM 10 MG PO TABS: ORAL_TABLET | ORAL | Status: DC

## 2012-03-14 MED ORDER — FLUTICASONE PROPIONATE 50 MCG/ACT NA SUSP: 2.0000 | Freq: Every day | NASAL | Status: DC

## 2012-03-14 MED ORDER — FLUCONAZOLE 150 MG PO TABS: 150.0000 mg | ORAL_TABLET | Freq: Once | ORAL | Status: DC

## 2012-03-14 MED ORDER — MONTELUKAST SODIUM 10 MG PO TABS: 10.0000 mg | ORAL_TABLET | Freq: Every day | ORAL | Status: DC

## 2012-03-14 MED ORDER — METOPROLOL SUCCINATE ER 100 MG PO TB24: 100.0000 mg | ORAL_TABLET | Freq: Every day | ORAL | Status: DC

## 2012-03-14 MED ORDER — ESTRADIOL 0.5 MG PO TABS: 0.5000 mg | ORAL_TABLET | Freq: Every day | ORAL | Status: DC

## 2012-03-14 NOTE — Patient Instructions (Addendum)
Stop by front desk and lab. Get back on track with weight watchers and regular exercise. Look into shingles vaccine coverage.  Diflucan fro rash.. cann if not improving. Avoid any other topical treatment. Hypoallergenic soap.

## 2012-03-14 NOTE — Progress Notes (Signed)
Subjective:    Patient ID: Michelle Donovan, female    DOB: [birth year], 66 y.o.   MRN: [id number]  HPI The patient is here for annual wellness exam and preventative care.    Has noted rash on B inner thighs and right axillae... Itchy, present for 1 month ago. She has been using OTC athletes foot cream. Some vaginal symptoms... No discharge, mild itching in vag and around rectum. No dysuria.  Elevated Cholesterol:  Due for re-eval. Lab Results  Component Value Date   CHOL 286* 05/21/2011   HDL 37.00* 05/21/2011   LDLCALC 108* 08/26/2009   LDLDIRECT 210.2 05/21/2011   TRIG 179.0* 05/21/2011   CHOLHDL 8 05/21/2011    Also pt had been on zocor for years in past with no issue and chol better controlled. Stopped due tp myalgia.  Started crestor in 05/2011 after above results.. She can only take this twice a week due to arm ache.  Has never tried fenofibrate or welchol.   No exercise and poor eating habits. Did well on weight watchers lost 12 lbs but gained weight back. Low motivation to make lifestyle changes.   Hypertension: Well controlled on no medication.  Using medication without problems or lightheadedness: Not on medication  Chest pain with exertion: none  Edema : stable  Short of breath:None  Average home BPs: Not checking at home.  Other issues:       Review of Systems  Constitutional: Negative for fever, fatigue and unexpected weight change.  HENT: Negative for ear pain, congestion, sore throat, sneezing, trouble swallowing and sinus pressure.   Eyes: Negative for pain and itching.  Respiratory: Positive for cough. Negative for shortness of breath and wheezing.        From allergies off singulair.  Cardiovascular: Negative for chest pain, palpitations and leg swelling.  Gastrointestinal: Negative for nausea, abdominal pain, diarrhea, constipation and blood in stool.  Genitourinary: Negative for dysuria, hematuria, vaginal discharge, difficulty urinating and menstrual problem.    Skin: Positive for rash.  Neurological: Negative for syncope, weakness, light-headedness, numbness and headaches.  Psychiatric/Behavioral: Negative for confusion and dysphoric mood. The patient is not nervous/anxious.        Objective:   Physical Exam  Constitutional: Vital signs are normal. She appears well-developed and well-nourished. She is cooperative.  Non-toxic appearance. She does not appear ill. No distress.  HENT:  Head: Normocephalic.  Right Ear: Hearing, tympanic membrane, external ear and ear canal normal.  Left Ear: Hearing, tympanic membrane, external ear and ear canal normal.  Nose: Nose normal.  Eyes: Conjunctivae normal, EOM and lids are normal. Pupils are equal, round, and reactive to light. No foreign bodies found.  Neck: Trachea normal and normal range of motion. Neck supple. Carotid bruit is not present. No mass and no thyromegaly present.  Cardiovascular: Normal rate, regular rhythm, S1 normal, S2 normal, normal heart sounds and intact distal pulses.  Exam reveals no gallop.   No murmur heard. Pulmonary/Chest: Effort normal and breath sounds normal. No respiratory distress. She has no wheezes. She has no rhonchi. She has no rales.  Abdominal: Soft. Normal appearance and bowel sounds are normal. She exhibits no distension, no fluid wave, no abdominal bruit and no mass. There is no hepatosplenomegaly. There is no tenderness. There is no rebound, no guarding and no CVA tenderness. No hernia.  Genitourinary: No breast swelling, tenderness, discharge or bleeding. Pelvic exam was performed with patient supine.  Lymphadenopathy:    She has no cervical adenopathy.  She has no axillary adenopathy.  Neurological: She is alert. She has normal strength. No cranial nerve deficit or sensory deficit.  Skin: Skin is warm, dry and intact. No rash noted.  Psychiatric: Her speech is normal and behavior is normal. Judgment normal. Her mood appears not anxious. Cognition and memory  are normal. She does not exhibit a depressed mood.          Assessment & Plan:  The patient's preventative maintenance and recommended screening tests for an annual wellness exam were reviewed in full today. Brought up to date unless services declined.  Counselled on the importance of diet, exercise, and its role in overall health and mortality. The patient's FH and SH was reviewed, including their home life, tobacco status, and drug and alcohol status.   No pap/DVE given total Hysterectomy. Vaccines: due for flu.. Refused.  Former smoker, remotely. <25 pack year history  Colon: last 2006, tics, repeat in 10 years Bone density: nml 12/18/10  Mammogram: nml 8/12.Marland Kitchen due

## 2012-03-14 NOTE — Assessment & Plan Note (Signed)
Well controlled. Continue current medication.  

## 2012-03-14 NOTE — Assessment & Plan Note (Signed)
Yeast on wet prep. Rash may be yeast infection partially treated... Will treat with oral diflucan. Pt to call if not improving as expected.

## 2012-03-14 NOTE — Telephone Encounter (Signed)
Pt seen today, wants to switch to tartrate due to cost.

## 2012-03-14 NOTE — Assessment & Plan Note (Signed)
Due for re-eval. 

## 2012-03-15 ENCOUNTER — Telehealth: Payer: Self-pay | Admitting: Family Medicine

## 2012-03-15 LAB — VITAMIN D 25 HYDROXY (VIT D DEFICIENCY, FRACTURES): Vit D, 25-Hydroxy: 36 ng/mL (ref 30–89)

## 2012-03-15 NOTE — Telephone Encounter (Signed)
Pt wants to know if tartrate will be effective for tremors. Please advise. Pt request call back [phone].

## 2012-03-15 NOTE — Telephone Encounter (Signed)
Pt said Crestor is $300 for 6 months; pt cannot afford and request a different cholesterol med prefers a generic if possible sent to [facility]. Has previously tried Simvastatin but was not effective.Please advise.

## 2012-03-16 ENCOUNTER — Telehealth: Payer: Self-pay | Admitting: *Deleted

## 2012-03-16 MED ORDER — ATORVASTATIN CALCIUM 40 MG PO TABS: 40.0000 mg | ORAL_TABLET | Freq: Every day | ORAL | Status: DC

## 2012-03-16 NOTE — Telephone Encounter (Signed)
I called pt with her lab results, and she mentioned that her pharmacy would be contacting us re her metoprolol rx. She was prescribed xl 100 mg for tremors, but med is too expensive. Pharmacy recommended seeing if the short acting metoprolol taken twice a day would be appropriate? If so pt request a 6 month supply sent in as the xl was.

## 2012-03-17 MED ORDER — METOPROLOL TARTRATE 100 MG PO TABS: 100.0000 mg | ORAL_TABLET | Freq: Two times a day (BID) | ORAL | Status: DC

## 2012-03-17 NOTE — Telephone Encounter (Signed)
Pt notified the Rx was changed per her request and sent into pharmacy.

## 2012-03-17 NOTE — Telephone Encounter (Signed)
Yes we can change to short acting metoprolol

## 2012-03-21 ENCOUNTER — Encounter: Payer: Self-pay | Admitting: Family Medicine

## 2012-04-27 ENCOUNTER — Other Ambulatory Visit: Payer: Self-pay

## 2012-04-27 MED ORDER — ALPRAZOLAM 0.25 MG PO TABS: 0.2500 mg | ORAL_TABLET | Freq: Two times a day (BID) | ORAL | Status: AC | PRN

## 2012-04-27 NOTE — Telephone Encounter (Signed)
Pt request [facility].Please advise.

## 2012-04-27 NOTE — Telephone Encounter (Signed)
rx called to pharmacy 

## 2012-09-18 ENCOUNTER — Other Ambulatory Visit: Payer: Self-pay | Admitting: Family Medicine

## 2012-10-19 ENCOUNTER — Other Ambulatory Visit: Payer: Self-pay | Admitting: Family Medicine

## 2012-11-21 ENCOUNTER — Other Ambulatory Visit: Payer: Self-pay | Admitting: Family Medicine

## 2012-11-28 ENCOUNTER — Ambulatory Visit: Payer: 59 | Admitting: Family Medicine

## 2012-12-07 ENCOUNTER — Ambulatory Visit (INDEPENDENT_AMBULATORY_CARE_PROVIDER_SITE_OTHER): Payer: BC Managed Care – PPO | Admitting: Family Medicine

## 2012-12-07 ENCOUNTER — Encounter: Payer: Self-pay | Admitting: Family Medicine

## 2012-12-07 VITALS — BP 130/80 | HR 67 | Temp 98.1°F | Ht 65.5 in | Wt 162.8 lb

## 2012-12-07 DIAGNOSIS — G5620 Lesion of ulnar nerve, unspecified upper limb: Secondary | ICD-10-CM | POA: Insufficient documentation

## 2012-12-07 DIAGNOSIS — R3915 Urgency of urination: Secondary | ICD-10-CM

## 2012-12-07 DIAGNOSIS — R5383 Other fatigue: Secondary | ICD-10-CM

## 2012-12-07 DIAGNOSIS — G250 Essential tremor: Secondary | ICD-10-CM

## 2012-12-07 DIAGNOSIS — E782 Mixed hyperlipidemia: Secondary | ICD-10-CM

## 2012-12-07 DIAGNOSIS — G252 Other specified forms of tremor: Secondary | ICD-10-CM

## 2012-12-07 DIAGNOSIS — E538 Deficiency of other specified B group vitamins: Secondary | ICD-10-CM

## 2012-12-07 DIAGNOSIS — R05 Cough: Secondary | ICD-10-CM

## 2012-12-07 DIAGNOSIS — R053 Chronic cough: Secondary | ICD-10-CM

## 2012-12-07 DIAGNOSIS — E559 Vitamin D deficiency, unspecified: Secondary | ICD-10-CM

## 2012-12-07 DIAGNOSIS — G5622 Lesion of ulnar nerve, left upper limb: Secondary | ICD-10-CM

## 2012-12-07 DIAGNOSIS — R5381 Other malaise: Secondary | ICD-10-CM

## 2012-12-07 DIAGNOSIS — R059 Cough, unspecified: Secondary | ICD-10-CM

## 2012-12-07 LAB — LIPID PANEL
HDL: 32.8 mg/dL — ABNORMAL LOW (ref >39.00)
Total CHOL/HDL Ratio: 8
VLDL: 48.4 mg/dL — ABNORMAL HIGH (ref 0.0–40.0)

## 2012-12-07 LAB — CBC WITH DIFFERENTIAL/PLATELET
Basophils Relative: 0.4 % (ref 0.0–3.0)
Eosinophils Absolute: 0.1 10*3/uL (ref 0.0–0.7)
Eosinophils Relative: 1.5 % (ref 0.0–5.0)
Hemoglobin: 12.8 g/dL (ref 12.0–15.0)
Lymphocytes Relative: 29.8 % (ref 12.0–46.0)
MCHC: 33.3 g/dL (ref 30.0–36.0)
MCV: 88.6 fl (ref 78.0–100.0)
Neutro Abs: 4.7 10*3/uL (ref 1.4–7.7)
RBC: 4.35 Mil/uL (ref 3.87–5.11)
WBC: 7.5 10*3/uL (ref 4.5–10.5)

## 2012-12-07 LAB — COMPREHENSIVE METABOLIC PANEL
ALT: 59 U/L — ABNORMAL HIGH (ref 0–35)
AST: 43 U/L — ABNORMAL HIGH (ref 0–37)
Albumin: 3.6 g/dL (ref 3.5–5.2)
Alkaline Phosphatase: 97 U/L (ref 39–117)
BUN: 8 mg/dL (ref 6–23)
Potassium: 4 mEq/L (ref 3.5–5.1)

## 2012-12-07 LAB — VITAMIN B12: Vitamin B-12: 1091 pg/mL — ABNORMAL HIGH (ref 211–911)

## 2012-12-07 MED ORDER — METOPROLOL SUCCINATE ER 100 MG PO TB24: 100.0000 mg | ORAL_TABLET | Freq: Every day | ORAL | Status: DC

## 2012-12-07 NOTE — Assessment & Plan Note (Signed)
Will eval with labs. 

## 2012-12-07 NOTE — Assessment & Plan Note (Signed)
Likley multifactorial due to BBlocker with history of asthma and due to allergies. No clear indication for re-eval of CT for nodules given stability in past. She gets enough benefit from BBlocker to continue.

## 2012-12-07 NOTE — Progress Notes (Signed)
Subjective:    Patient ID: Michelle Donovan, female    DOB: Apr 10, 1946, 67 y.o.   MRN: [id number]  HPI  67 year old female presents for discussion of medication.  She is interested in weaning off estrace.  Last year she ran out and stopped  She has had darker yellow discharge since on estrace after hysterectomy.  No burning. She wonders if this is from the estrogen.  She has had some urinary urgency, doesn't feel like she is emptying completely. UOP 5-6 per day, 3-4 times a night. Has to go several times before she gets to sleep.  Urine with odor, mild, no dysuria. No fever, NO CVA tenderness.  She is not interested in a medication.  Tremor has worsened despite metoprolol.  Has gotten worse since retiring.  Almost at the point she cannot write and function,diffiuclty with eyeliner makeup. Has seen a neurologist for this... dids not have a good experience... He said minimal options.  In last few months she has been having cough. No SOB.  She is worried about past pulm nodule seen in 2010.  She has history of asthma and allergies.  No night sweats, no unexpected weight loss.  She is fatigued.  Numbness in left pinky and occ pain in left forearm. Dies doe activity that puts pressure on ulnar nerve at elbow.   Review of Systems  Constitutional: Negative for fever and fatigue.  HENT: Negative for ear pain.   Eyes: Negative for pain.  Respiratory: Positive for cough. Negative for chest tightness and shortness of breath.   Cardiovascular: Negative for chest pain, palpitations and leg swelling.  Gastrointestinal: Negative for abdominal pain.  Genitourinary: Negative for dysuria.       Objective:   Physical Exam  Constitutional: She is oriented to person, place, and time. Vital signs are normal. She appears well-developed and well-nourished. She is cooperative.  Non-toxic appearance. She does not appear ill. No distress.  HENT:  Head: Normocephalic.  Right Ear: Hearing, tympanic  membrane, external ear and ear canal normal. Tympanic membrane is not erythematous, not retracted and not bulging.  Left Ear: Hearing, tympanic membrane, external ear and ear canal normal. Tympanic membrane is not erythematous, not retracted and not bulging.  Nose: No mucosal edema or rhinorrhea. Right sinus exhibits no maxillary sinus tenderness and no frontal sinus tenderness. Left sinus exhibits no maxillary sinus tenderness and no frontal sinus tenderness.  Mouth/Throat: Uvula is midline, oropharynx is clear and moist and mucous membranes are normal.  Eyes: Conjunctivae, EOM and lids are normal. Pupils are equal, round, and reactive to light. No foreign bodies found.  Neck: Trachea normal and normal range of motion. Neck supple. Carotid bruit is not present. No mass and no thyromegaly present.  Cardiovascular: Normal rate, regular rhythm, S1 normal, S2 normal, normal heart sounds, intact distal pulses and normal pulses.  Exam reveals no gallop and no friction rub.   No murmur heard. Pulmonary/Chest: Effort normal and breath sounds normal. Not tachypneic. No respiratory distress. She has no decreased breath sounds. She has no wheezes. She has no rhonchi. She has no rales.  Abdominal: Soft. Normal appearance and bowel sounds are normal. There is no tenderness.  Neurological: She is alert and oriented to person, place, and time.  Reproduced ulnar nerve compression at elbow ... To pinky  Skin: Skin is warm, dry and intact. No rash noted.  Psychiatric: Her speech is normal and behavior is normal. Judgment and thought content normal. Her mood appears not anxious.  Cognition and memory are normal. She does not exhibit a depressed mood.          Assessment & Plan:

## 2012-12-07 NOTE — Patient Instructions (Addendum)
Decrease estrace to 1/2 tab daily or 1 tab every other day... For 2-3 months then wean further till off. Call if cough continued. Stop at lab on your way out.. We will call with results.

## 2012-12-07 NOTE — Assessment & Plan Note (Signed)
Will try longacting BBlocker as this helped pt in past.  MAy need to try increase.  Pt willing to accept SE given benefit.

## 2012-12-07 NOTE — Assessment & Plan Note (Signed)
Discussed avoidance of pressure on ulnar nerve.

## 2012-12-07 NOTE — Assessment & Plan Note (Signed)
Due for re-eval. 

## 2012-12-07 NOTE — Assessment & Plan Note (Signed)
Pt not interested in medication to treat.

## 2012-12-17 ENCOUNTER — Other Ambulatory Visit: Payer: Self-pay | Admitting: Family Medicine

## 2012-12-20 ENCOUNTER — Other Ambulatory Visit: Payer: Self-pay | Admitting: Family Medicine

## 2013-01-12 ENCOUNTER — Other Ambulatory Visit: Payer: Self-pay | Admitting: Family Medicine

## 2013-03-13 ENCOUNTER — Encounter: Payer: Self-pay | Admitting: Family Medicine

## 2013-03-13 ENCOUNTER — Ambulatory Visit (INDEPENDENT_AMBULATORY_CARE_PROVIDER_SITE_OTHER): Payer: Medicare Other | Admitting: Family Medicine

## 2013-03-13 VITALS — BP 130/70 | HR 59 | Temp 98.2°F | Ht 65.5 in | Wt 161.5 lb

## 2013-03-13 DIAGNOSIS — G250 Essential tremor: Secondary | ICD-10-CM

## 2013-03-13 DIAGNOSIS — F438 Other reactions to severe stress: Secondary | ICD-10-CM

## 2013-03-13 MED ORDER — PROPRANOLOL HCL ER 80 MG PO CP24: 80.0000 mg | ORAL_CAPSULE | Freq: Every day | ORAL | Status: DC

## 2013-03-13 MED ORDER — ALPRAZOLAM 0.25 MG PO TABS: 0.2500 mg | ORAL_TABLET | Freq: Every day | ORAL | Status: DC | PRN

## 2013-03-13 NOTE — Assessment & Plan Note (Signed)
She will try arm weights. We will try inderal LA trial. Follow up in 1 month.

## 2013-03-13 NOTE — Progress Notes (Signed)
  Subjective:    Patient ID: Michelle Donovan, female    DOB: Sep 19, 1945, 67 y.o.   MRN: [id number]  HPI  67 year old female presents for follow up on essential tremor..  She reports that metoprolol  Is no longer helping like it did in the past.  Tremor is gradually worsening and is bothering her a lot she has. BBlockers (atenolol and metoprolol) in past caused fatigue but she is willing to try again. Has tried neurontin, primidone, mysoline, topamax with SE or no relief.  She says neurologist she saw 2 years ago was unhelpful. Said there was nothing else that could be done. She has never tried inderal.   Xanax helped with tremor associated with situational anxiety.. She does not want to use this reguarly.     Review of Systems  Constitutional: Negative for fever and fatigue.  HENT: Negative for ear pain.   Eyes: Negative for pain.  Respiratory: Negative for chest tightness and shortness of breath.   Cardiovascular: Negative for chest pain, palpitations and leg swelling.  Gastrointestinal: Negative for abdominal pain.  Genitourinary: Negative for dysuria.       Objective:   Physical Exam  Constitutional: She is oriented to person, place, and time. Vital signs are normal. She appears well-developed and well-nourished. She is cooperative.  Non-toxic appearance. She does not appear ill. No distress.  HENT:  Head: Normocephalic.  Right Ear: Hearing, tympanic membrane, external ear and ear canal normal. Tympanic membrane is not erythematous, not retracted and not bulging.  Left Ear: Hearing, tympanic membrane, external ear and ear canal normal. Tympanic membrane is not erythematous, not retracted and not bulging.  Nose: No mucosal edema or rhinorrhea. Right sinus exhibits no maxillary sinus tenderness and no frontal sinus tenderness. Left sinus exhibits no maxillary sinus tenderness and no frontal sinus tenderness.  Mouth/Throat: Uvula is midline, oropharynx is clear and moist and mucous  membranes are normal.  Eyes: Conjunctivae, EOM and lids are normal. Pupils are equal, round, and reactive to light. Lids are everted and swept, no foreign bodies found.  Neck: Trachea normal and normal range of motion. Neck supple. Carotid bruit is not present. No mass and no thyromegaly present.  Cardiovascular: Normal rate, regular rhythm, S1 normal, S2 normal, normal heart sounds, intact distal pulses and normal pulses.  Exam reveals no gallop and no friction rub.   No murmur heard. Pulmonary/Chest: Effort normal and breath sounds normal. Not tachypneic. No respiratory distress. She has no decreased breath sounds. She has no wheezes. She has no rhonchi. She has no rales.  Abdominal: Soft. Normal appearance and bowel sounds are normal. There is no tenderness.  Neurological: She is alert and oriented to person, place, and time. She has normal strength. She displays tremor. No cranial nerve deficit or sensory deficit. She displays a negative Romberg sign. Coordination and gait normal.  Intention tremor.  Skin: Skin is warm, dry and intact. No rash noted.  Psychiatric: Her speech is normal and behavior is normal. Judgment and thought content normal. Her mood appears not anxious. Cognition and memory are normal. She does not exhibit a depressed mood.          Assessment & Plan:

## 2013-03-13 NOTE — Patient Instructions (Signed)
Start inderal LA daily in place of metoprolol.  Use alprazolam prn situational anxiety and associated tremor worsening.  Follow up in 1 month.

## 2013-03-13 NOTE — Assessment & Plan Note (Signed)
Xanax prn situational anxiety and associated worsening of tremor.

## 2013-03-22 ENCOUNTER — Other Ambulatory Visit: Payer: Self-pay

## 2013-04-08 ENCOUNTER — Emergency Department (HOSPITAL_COMMUNITY): Payer: Medicare Other

## 2013-04-08 ENCOUNTER — Emergency Department (HOSPITAL_COMMUNITY)
Admission: EM | Admit: 2013-04-08 | Discharge: 2013-04-09 | Disposition: A | Discharge status: Home / Self Care | Payer: Medicare Other | Attending: Emergency Medicine | Admitting: Emergency Medicine

## 2013-04-08 ENCOUNTER — Encounter (HOSPITAL_COMMUNITY): Payer: Self-pay | Admitting: Emergency Medicine

## 2013-04-08 DIAGNOSIS — E782 Mixed hyperlipidemia: Secondary | ICD-10-CM | POA: Insufficient documentation

## 2013-04-08 DIAGNOSIS — R110 Nausea: Secondary | ICD-10-CM | POA: Insufficient documentation

## 2013-04-08 DIAGNOSIS — Z8719 Personal history of other diseases of the digestive system: Secondary | ICD-10-CM | POA: Insufficient documentation

## 2013-04-08 DIAGNOSIS — Z87891 Personal history of nicotine dependence: Secondary | ICD-10-CM | POA: Insufficient documentation

## 2013-04-08 DIAGNOSIS — I10 Essential (primary) hypertension: Secondary | ICD-10-CM | POA: Insufficient documentation

## 2013-04-08 DIAGNOSIS — Z872 Personal history of diseases of the skin and subcutaneous tissue: Secondary | ICD-10-CM | POA: Insufficient documentation

## 2013-04-08 DIAGNOSIS — Z79899 Other long term (current) drug therapy: Secondary | ICD-10-CM | POA: Insufficient documentation

## 2013-04-08 DIAGNOSIS — J45909 Unspecified asthma, uncomplicated: Secondary | ICD-10-CM | POA: Insufficient documentation

## 2013-04-08 DIAGNOSIS — Z8659 Personal history of other mental and behavioral disorders: Secondary | ICD-10-CM | POA: Insufficient documentation

## 2013-04-08 DIAGNOSIS — K8020 Calculus of gallbladder without cholecystitis without obstruction: Secondary | ICD-10-CM

## 2013-04-08 DIAGNOSIS — R1013 Epigastric pain: Secondary | ICD-10-CM | POA: Insufficient documentation

## 2013-04-08 LAB — CBC
HCT: 34.6 % — ABNORMAL LOW (ref 36.0–46.0)
Hemoglobin: 11.6 g/dL — ABNORMAL LOW (ref 12.0–15.0)
MCHC: 33.5 g/dL (ref 30.0–36.0)
MCV: 88.7 fL (ref 78.0–100.0)
RDW: 13 % (ref 11.5–15.5)
WBC: 8.6 10*3/uL (ref 4.0–10.5)

## 2013-04-08 LAB — POCT I-STAT TROPONIN I

## 2013-04-08 LAB — COMPREHENSIVE METABOLIC PANEL
ALT: 70 U/L — ABNORMAL HIGH (ref 0–35)
Albumin: 3.3 g/dL — ABNORMAL LOW (ref 3.5–5.2)
Alkaline Phosphatase: 138 U/L — ABNORMAL HIGH (ref 39–117)
BUN: 12 mg/dL (ref 6–23)
CO2: 25 mEq/L (ref 19–32)
Chloride: 101 mEq/L (ref 96–112)
Glucose, Bld: 142 mg/dL — ABNORMAL HIGH (ref 70–99)
Potassium: 3.9 mEq/L (ref 3.5–5.1)
Sodium: 137 mEq/L (ref 135–145)
Total Bilirubin: 0.2 mg/dL — ABNORMAL LOW (ref 0.3–1.2)
Total Protein: 7 g/dL (ref 6.0–8.3)

## 2013-04-08 NOTE — ED Provider Notes (Signed)
CSN: [id number]     Arrival date & time 04/08/13  2014 History   First MD Initiated Contact with Patient 04/08/13 2017     Chief Complaint  Patient presents with  . Chest Pain   (Consider location/radiation/quality/duration/timing/severity/associated sxs/prior Treatment) HPI Comments: Patient is a 67 year old female past medical history significant for hypertension, hyperlipidemia, irritable bowel syndrome presenting to the emergency department for acute onset central chest pain without radiation that began around 6:30 this evening. Patient is describing her pain as a knot. She states she had associated nausea but no vomiting, diaphoresis, shortness of breath or cough. She states after receiving 324 mg of aspirin and sublingual nitroglycerin her pain has completely resolved. Patient states she has had one episode of chest pain similar to this 14 years ago she was admitted for chest pain rule out. She states she had an echocardiogram done and was advised to have a cardiac catheterization but declined it at that time. She states she has no further issues . She does not have a cardiologist that she follows.   Past Medical History  Diagnosis Date  . Dermatophytosis of scalp and beard   . Mixed hyperlipidemia   . Lumbago     bulging disk per MRI   . Irritable bowel syndrome   . Essential and other specified forms of tremor   . Allergic rhinitis, cause unspecified   . Unspecified asthma(493.90)   . HTN (hypertension)   . Other acute reactions to stress   . Other diseases of lung, not elsewhere classified     solitary pulm. nodule(left)  . Unspecified vitamin D deficiency   . Diverticulosis    Past Surgical History  Procedure Laterality Date  . Laparoscopic total hysterectomy  2002  . Tubal ligation  1986  . Tonsillectomy  1967/68   Family History  Problem Relation Age of Onset  . Other Father     Trigeminal neuralgia  . Stroke Mother   . Tremor Mother   . Diabetes Mother   .  Heart failure Mother   . Diabetes Sister   . Coronary artery disease Sister   . Irritable bowel syndrome Sister   . Tremor Sister   . Diabetes Brother     x 3  . Coronary artery disease Brother     x 3  . Irritable bowel syndrome Brother     x 3  . Tremor Brother     x 3  . Breast cancer      Aunts and cousin  . Colon cancer Maternal Grandfather    History  Substance Use Topics  . Smoking status: Former Smoker    Quit date: 05/17/1980  . Smokeless tobacco: Never Used  . Alcohol Use: No   OB History   Grav Para Term Preterm Abortions TAB SAB Ect Mult Living                 Review of Systems  Constitutional: Negative for fever.  Respiratory: Negative for cough and shortness of breath.   Cardiovascular: Positive for chest pain.  Gastrointestinal: Positive for nausea. Negative for vomiting and abdominal pain.  All other systems reviewed and are negative.    Allergies  Codeine  Home Medications   Current Outpatient Rx  Name  Route  Sig  Dispense  Refill  . ALPRAZolam (XANAX) 0.25 MG tablet   Oral   Take 1 tablet (0.25 mg total) by mouth daily as needed for anxiety (and tremor).   30 tablet  0   . atorvastatin (LIPITOR) 40 MG tablet   Oral   Take 40 mg by mouth at bedtime.         . cetirizine (ZYRTEC) 10 MG tablet   Oral   Take 10 mg by mouth daily.         . cholecalciferol (VITAMIN D) 1000 UNITS tablet   Oral   Take 1,000 Units by mouth daily.         . fluticasone (FLONASE) 50 MCG/ACT nasal spray   Each Nare   Place 2 sprays into both nostrils daily as needed for allergies or rhinitis.         Marland Kitchen montelukast (SINGULAIR) 10 MG tablet      TAKE 1 TABLET BY MOUTH AT BEDTIME   30 tablet   5   . propranolol ER (INDERAL LA) 80 MG 24 hr capsule   Oral   Take 1 capsule (80 mg total) by mouth daily.   30 capsule   11   . vitamin B-12 (CYANOCOBALAMIN) 1000 MCG tablet   Oral   Take 1,000 mcg by mouth daily.           BP 130/57  Pulse  69  Temp(Src) 98.2 F (36.8 C) (Oral)  Resp 16  SpO2 96% Physical Exam  Constitutional: She is oriented to person, place, and time. She appears well-developed and well-nourished. No distress.  HENT:  Head: Normocephalic and atraumatic.  Right Ear: External ear normal.  Left Ear: External ear normal.  Nose: Nose normal.  Mouth/Throat: Oropharynx is clear and moist. No oropharyngeal exudate.  Eyes: Conjunctivae are normal. Pupils are equal, round, and reactive to light.  Neck: Neck supple.  Cardiovascular: Normal rate, regular rhythm, normal heart sounds and intact distal pulses.   Pulmonary/Chest: Effort normal and breath sounds normal. No respiratory distress. She has no wheezes. She exhibits no tenderness.  Abdominal: Soft. Bowel sounds are normal. She exhibits no distension. There is no tenderness. There is no rebound and no guarding.  Musculoskeletal: Normal range of motion. She exhibits no edema.  Lymphadenopathy:    She has no cervical adenopathy.  Neurological: She is alert and oriented to person, place, and time.  Skin: Skin is warm and dry. She is not diaphoretic.    ED Course  Procedures (including critical care time) Labs Review Labs Reviewed  CBC - Abnormal; Notable for the following:    Hemoglobin 11.6 (*)    HCT 34.6 (*)    All other components within normal limits  COMPREHENSIVE METABOLIC PANEL - Abnormal; Notable for the following:    Glucose, Bld 142 (*)    Albumin 3.3 (*)    AST 48 (*)    ALT 70 (*)    Alkaline Phosphatase 138 (*)    Total Bilirubin 0.2 (*)    GFR calc non Af Amer 89 (*)    All other components within normal limits  LIPASE, BLOOD - Abnormal; Notable for the following:    Lipase 71 (*)    All other components within normal limits  POCT I-STAT TROPONIN I   Imaging Review Dg Chest Port 1 View  04/08/2013   CLINICAL DATA:  Chest pain with bilateral arm pain and weakness.  EXAM: PORTABLE CHEST - 1 VIEW  COMPARISON:  CT chest 09/24/2008   FINDINGS: The heart size and mediastinal contours are within normal limits. Both lungs are clear. The visualized skeletal structures are unremarkable.  IMPRESSION: No active disease.   Electronically Signed  By: Burman Nieves M.D.   On: 04/08/2013 21:29    EKG Interpretation    Date/Time:  Sunday April 08 2013 20:26:15 EST Ventricular Rate:  70 PR Interval:  191 QRS Duration: 77 QT Interval:  404 QTC Calculation: 436 R Axis:   19 Text Interpretation:  Sinus rhythm Confirmed by Fayrene Fearing  MD, MARK ([id number]) on 04/09/2013 12:18:32 AM            MDM   1. Epigastric pain     Afebrile, NAD, non-toxic appearing, AAOx4. Epigastric pain that began around 6:30PM this evening resolved with 2 SL nitro and zofran. Remained pain free in ED. VSS, no tracheal deviation, no JVD or new murmur, RRR, breath sounds equal bilaterally, EKG without acute abnormalities, negative troponin, and negative CXR. LFTs and Lipase elevated, strong family history of cholelithiasis and cystitis. Will obtain US RUQ abdomen to r/o gallbladder etiology. Will repeat troponin. If all work up negative will d/c patient home with appropriate follow ups. Discussed plan with Earley Favor, NP.   Case has been discussed with and seen by Dr. Fayrene Fearing who agrees with the above plan to discharge.      Jeannetta Ellis, PA-C 04/09/13 [phone]

## 2013-04-08 NOTE — ED Notes (Signed)
EMS gave 324 mg aspirin en route 

## 2013-04-08 NOTE — ED Notes (Signed)
Onset chest pain epi gastric one hour prior to arrival stated to EMS feels like a knot and would feel better if she could burp. EMS IV left AC 20 given 2 nitro prior to arrival pain 8/10 currently 0/10 with zofran 4mg  IVP for nausea.

## 2013-04-09 ENCOUNTER — Emergency Department (HOSPITAL_COMMUNITY): Payer: Medicare Other

## 2013-04-09 LAB — POCT I-STAT TROPONIN I: Troponin i, poc: 0.01 ng/mL (ref 0.00–0.08)

## 2013-04-09 MED ORDER — ONDANSETRON HCL 4 MG PO TABS: 4.0000 mg | ORAL_TABLET | Freq: Four times a day (QID) | ORAL | Status: DC

## 2013-04-09 NOTE — ED Provider Notes (Signed)
Second troponin again negative But US shows gall stones without acute infection or inflammation  Does not require surgery at this time Will refer to [facility] for follow up    Arman Filter, NP 04/09/13 [phone]

## 2013-04-11 NOTE — ED Provider Notes (Signed)
Medical screening examination/treatment/procedure(s) were performed by non-physician practitioner and as supervising physician I was immediately available for consultation/collaboration.  EKG Interpretation    Date/Time:  Sunday April 08 2013 20:26:15 EST Ventricular Rate:  70 PR Interval:  191 QRS Duration: 77 QT Interval:  404 QTC Calculation: 436 R Axis:   19 Text Interpretation:  Sinus rhythm Confirmed by Axten Pascucci  MD, Charli Liberatore ([id number]) on 04/09/2013 12:18:32 AM              Michelle Donovan J Michelle Wickens, MD 04/11/13 0745 

## 2013-04-11 NOTE — ED Provider Notes (Signed)
Medical screening examination/treatment/procedure(s) were performed by non-physician practitioner and as supervising physician I was immediately available for consultation/collaboration.  EKG Interpretation    Date/Time:  Sunday April 08 2013 20:26:15 EST Ventricular Rate:  70 PR Interval:  191 QRS Duration: 77 QT Interval:  404 QTC Calculation: 436 R Axis:   19 Text Interpretation:  Sinus rhythm Confirmed by Fayrene Fearing  MD, Asti Mackley ([id number]) on 04/09/2013 12:18:32 AM              Roney Marion, MD 04/11/13 0745

## 2013-04-17 ENCOUNTER — Encounter: Payer: Self-pay | Admitting: Family Medicine

## 2013-04-17 ENCOUNTER — Ambulatory Visit (INDEPENDENT_AMBULATORY_CARE_PROVIDER_SITE_OTHER): Payer: Medicare Other | Admitting: Family Medicine

## 2013-04-17 VITALS — BP 120/62 | HR 72 | Temp 98.0°F | Ht 65.5 in | Wt 162.5 lb

## 2013-04-17 DIAGNOSIS — K5900 Constipation, unspecified: Secondary | ICD-10-CM

## 2013-04-17 DIAGNOSIS — G250 Essential tremor: Secondary | ICD-10-CM

## 2013-04-17 DIAGNOSIS — R1013 Epigastric pain: Secondary | ICD-10-CM

## 2013-04-17 DIAGNOSIS — I10 Essential (primary) hypertension: Secondary | ICD-10-CM

## 2013-04-17 DIAGNOSIS — K5909 Other constipation: Secondary | ICD-10-CM | POA: Insufficient documentation

## 2013-04-17 MED ORDER — PROPRANOLOL HCL ER 120 MG PO CP24: 120.0000 mg | ORAL_CAPSULE | Freq: Every day | ORAL | Status: DC

## 2013-04-17 NOTE — Assessment & Plan Note (Signed)
No low BPs or pulse on inderal LA.

## 2013-04-17 NOTE — Assessment & Plan Note (Signed)
Increase inderal LA to 120 mg daily. Follow up in 1 month,

## 2013-04-17 NOTE — Patient Instructions (Addendum)
Increase inderal LA to 120 daily. Try daily miralax, increase fiber and water, use milk of magnesia as needed. Start prilosec 2 tabs of 20 mg daily x 4-6 weeks.  Avoid caffeine, chocolate, spicy foods, citris tomato. If not improving in 2 week , call. If at any time.. Pain is severe in right upper quadrant  Lasting > 3 hours along with nausea/ vomiting, and fever... Go to ER.  Follow up in 110month for constipation and tremor as well as epigastric pain.

## 2013-04-17 NOTE — Progress Notes (Signed)
67 year old female presents for follow up on essential tremor.  At last appt she was started on inderal LA  She reported at last OV that metoprolol was no longer helping like it did in the past.  Tremor was gradually worsening and  bothering her a lot . BBlockers (atenolol and metoprolol) in past caused fatigue but she is willing to try again. Has tried neurontin, primidone, mysoline, topamax with SE or no relief.  She says neurologist she saw 2 years ago was unhelpful. Said there was nothing else that could be done. She has never tried inderal.  Xanax helped with tremor associated with situational anxiety.. She does not want to use this reguarly.   She reports she has noted minimal improvement in tremor on inderal LA. She has been very cold and has been having some constipaiton... She associates this with the medication.  She was seen at ER 1 week ago for epigastric pain. Dx with gallstones. Told to follow up with surgeon. She has had no further severe attacks. She has been avoiding fried fatty foods and has only had two mild attacks ( occurs 30 min after eating, last 30 min, burping a lot, improves with tums.)  She is not on PPI.    Review of Systems  Constitutional: Negative for fever and fatigue.  HENT: Negative for ear pain.  Eyes: Negative for pain.  Respiratory: Negative for chest tightness and shortness of breath.  Cardiovascular: Negative for chest pain, palpitations and leg swelling.  Gastrointestinal: Negative for abdominal pain.  Genitourinary: Negative for dysuria.  Objective:   Physical Exam  Constitutional: She is oriented to person, place, and time. Vital signs are normal. She appears well-developed and well-nourished. She is cooperative. Non-toxic appearance. She does not appear ill. No distress.  HENT:  Head: Normocephalic.  Right Ear: Hearing, tympanic membrane, external ear and ear canal normal. Tympanic membrane is not erythematous, not retracted and not bulging.   Left Ear: Hearing, tympanic membrane, external ear and ear canal normal. Tympanic membrane is not erythematous, not retracted and not bulging.  Nose: No mucosal edema or rhinorrhea. Right sinus exhibits no maxillary sinus tenderness and no frontal sinus tenderness. Left sinus exhibits no maxillary sinus tenderness and no frontal sinus tenderness.  Mouth/Throat: Uvula is midline, oropharynx is clear and moist and mucous membranes are normal.  Eyes: Conjunctivae, EOM and lids are normal. Pupils are equal, round, and reactive to light. Lids are everted and swept, no foreign bodies found.  Neck: Trachea normal and normal range of motion. Neck supple. Carotid bruit is not present. No mass and no thyromegaly present.  Cardiovascular: Normal rate, regular rhythm, S1 normal, S2 normal, normal heart sounds, intact distal pulses and normal pulses. Exam reveals no gallop and no friction rub.  No murmur heard.  Pulmonary/Chest: Effort normal and breath sounds normal. Not tachypneic. No respiratory distress. She has no decreased breath sounds. She has no wheezes. She has no rhonchi. She has no rales.  Abdominal: Soft. Normal appearance and bowel sounds are normal. There is no tenderness.  Neurological: She is alert and oriented to person, place, and time. She has normal strength. She displays tremor. No cranial nerve deficit or sensory deficit. She displays a negative Romberg sign. Coordination and gait normal.  Intention tremor.  Skin: Skin is warm, dry and intact. No rash noted.  Psychiatric: Her speech is normal and behavior is normal. Judgment and thought content normal. Her mood appears not anxious. Cognition and memory are normal. She  does not exhibit a depressed mood.

## 2013-04-17 NOTE — Assessment & Plan Note (Signed)
Discussed it is not safe to use laxative daily longterm. Will have her try miralax , increase water and fiber, only use MOM prn.

## 2013-04-17 NOTE — Progress Notes (Signed)
Pre-visit discussion using our clinic review tool. No additional management support is needed unless otherwise documented below in the visit note.  

## 2013-04-17 NOTE — Assessment & Plan Note (Signed)
Description of pain location and quality ans associated symptoms seems more consistent with GERD or gastritis than secondary to gallstones. Will have her try trial of PPI, if not better can try stronger PPI, if still not better refer to surgeon for possible cholecystectomy.

## 2013-06-05 ENCOUNTER — Ambulatory Visit (INDEPENDENT_AMBULATORY_CARE_PROVIDER_SITE_OTHER)
Admission: RE | Admit: 2013-06-05 | Discharge: 2013-06-05 | Disposition: A | Discharge status: Home / Self Care | Payer: Medicare HMO | Source: Ambulatory Visit | Attending: Family Medicine | Admitting: Family Medicine

## 2013-06-05 ENCOUNTER — Ambulatory Visit (INDEPENDENT_AMBULATORY_CARE_PROVIDER_SITE_OTHER): Payer: Medicare HMO | Admitting: Family Medicine

## 2013-06-05 ENCOUNTER — Encounter: Payer: Self-pay | Admitting: Family Medicine

## 2013-06-05 VITALS — BP 100/64 | HR 79 | Temp 97.9°F | Ht 65.5 in | Wt 162.5 lb

## 2013-06-05 DIAGNOSIS — M542 Cervicalgia: Secondary | ICD-10-CM

## 2013-06-05 DIAGNOSIS — G252 Other specified forms of tremor: Secondary | ICD-10-CM

## 2013-06-05 DIAGNOSIS — M79602 Pain in left arm: Secondary | ICD-10-CM

## 2013-06-05 DIAGNOSIS — E559 Vitamin D deficiency, unspecified: Secondary | ICD-10-CM

## 2013-06-05 DIAGNOSIS — M79609 Pain in unspecified limb: Secondary | ICD-10-CM

## 2013-06-05 DIAGNOSIS — M79601 Pain in right arm: Secondary | ICD-10-CM

## 2013-06-05 DIAGNOSIS — G250 Essential tremor: Secondary | ICD-10-CM

## 2013-06-05 MED ORDER — DICLOFENAC SODIUM 75 MG PO TBEC: 75.0000 mg | DELAYED_RELEASE_TABLET | Freq: Two times a day (BID) | ORAL | Status: DC

## 2013-06-05 NOTE — Patient Instructions (Signed)
Start diclofenac twice daily (no advil, asprin or aleve).  Heat.  Can use tramadol for pain.  We will call with Xray results.  May need to consider MRI of Neck.

## 2013-06-05 NOTE — Assessment & Plan Note (Signed)
No improvement with holding statin. ? Muscle issue otherwise or pain from neck issue.

## 2013-06-05 NOTE — Progress Notes (Signed)
Pre-visit discussion using our clinic review tool. No additional management support is needed unless otherwise documented below in the visit note.  

## 2013-06-05 NOTE — Assessment & Plan Note (Signed)
Neck source is most consistent cause apparent of arm pain. Will eval with cervicla X-ray, and likely MRI neck to rule out spinal stenosis cervical.

## 2013-06-05 NOTE — Progress Notes (Signed)
Subjective:    Patient ID: Michelle Donovan, female    DOB: 19-May-1945, 68 y.o.   MRN: [id number]  HPI 68 year old female presents for follow up treatment for essential tremor. At last oV 1 month ago increase inderal LA to 120 mg daily.  She reports  She has had a slight increase in benefit on higher dose of inderal. Still body and hand tremor, no head movement. 25% percent improvement in total.  No lightheadedness. No SOB, some occ cough. BP Readings from Last 3 Encounters:  06/05/13 100/64  04/17/13 120/62  04/09/13 129/60  HR79  She has also been having B upper arm pain ongoing for several months. Worse in last few weeks.  No known injury.  Extends up to shoulders, up back of neck and down to elbows  No pain at rest, only notes with movement. Pain with int rotation of B shoulder.  Apin when she moves her neck. Hears clicking and popping when moves neck  Muscles feel stiff. Pain with massage in neck.  Occ wakes her up at night when she moves. No numbness.  Occ hands going to sleep.  She has tried advil, helps minimally.  She previously thought it was  due to liptior. She stopped lipitor in last 2 weeks, with no benefit.     Review of Systems  Constitutional: Negative for fever and fatigue.  HENT: Negative for ear pain.   Eyes: Negative for pain.  Respiratory: Negative for chest tightness and shortness of breath.   Cardiovascular: Negative for chest pain, palpitations and leg swelling.  Gastrointestinal: Negative for abdominal pain.  Genitourinary: Negative for dysuria.       Objective:   Physical Exam  Constitutional: She is oriented to person, place, and time. Vital signs are normal. She appears well-developed and well-nourished. She is cooperative.  Non-toxic appearance. She does not appear ill. No distress.  HENT:  Head: Normocephalic.  Right Ear: Hearing, tympanic membrane, external ear and ear canal normal. Tympanic membrane is not erythematous, not  retracted and not bulging.  Left Ear: Hearing, tympanic membrane, external ear and ear canal normal. Tympanic membrane is not erythematous, not retracted and not bulging.  Nose: No mucosal edema or rhinorrhea. Right sinus exhibits no maxillary sinus tenderness and no frontal sinus tenderness. Left sinus exhibits no maxillary sinus tenderness and no frontal sinus tenderness.  Mouth/Throat: Uvula is midline, oropharynx is clear and moist and mucous membranes are normal.  Eyes: Conjunctivae, EOM and lids are normal. Pupils are equal, round, and reactive to light. Lids are everted and swept, no foreign bodies found.  Neck: Trachea normal and normal range of motion. Neck supple. Carotid bruit is not present. No mass and no thyromegaly present.  Cardiovascular: Normal rate, regular rhythm, S1 normal, S2 normal, normal heart sounds, intact distal pulses and normal pulses.  Exam reveals no gallop and no friction rub.   No murmur heard. Pulmonary/Chest: Effort normal and breath sounds normal. Not tachypneic. No respiratory distress. She has no decreased breath sounds. She has no wheezes. She has no rhonchi. She has no rales.  Abdominal: Soft. Normal appearance and bowel sounds are normal. There is no tenderness.  Musculoskeletal:  No clearly positive spurlings B.  Decreased ROM in neck, crepitus in neck woith lateral rotations  Full ROM in B shoulders, but slowed, pain in upper shoulder woith int rotation of shoulders  Neg Neer's, neg drop arm test  Neurological: She is alert and oriented to person, place, and time.  She has normal strength and normal reflexes. She displays tremor. No cranial nerve deficit or sensory deficit. She exhibits normal muscle tone. She displays a negative Romberg sign. Coordination and gait normal. GCS eye subscore is 4. GCS verbal subscore is 5. GCS motor subscore is 6.  Nml cerebellar exam   No papilledema  Skin: Skin is warm, dry and intact. No rash noted.  Psychiatric: She  has a normal mood and affect. Her speech is normal and behavior is normal. Judgment and thought content normal. Her mood appears not anxious. Cognition and memory are normal. Cognition and memory are not impaired. She does not exhibit a depressed mood. She exhibits normal recent memory and normal remote memory.          Assessment & Plan:

## 2013-06-05 NOTE — Assessment & Plan Note (Signed)
Improved some on inderal. Continue. If wanting more improvement consider movement specialist like Dr. Carles Collet neuro. Last neurologist was not helpful per pt.

## 2013-06-05 NOTE — Assessment & Plan Note (Signed)
nml last check.

## 2013-06-26 ENCOUNTER — Telehealth: Payer: Self-pay

## 2013-06-26 DIAGNOSIS — G250 Essential tremor: Secondary | ICD-10-CM

## 2013-06-26 DIAGNOSIS — G252 Other specified forms of tremor: Principal | ICD-10-CM

## 2013-06-26 NOTE — Telephone Encounter (Signed)
Pt request referral for neurologist,Dr Ron Parker; has previously discussed with Dr Diona Browner for essential tremor.

## 2013-07-02 ENCOUNTER — Other Ambulatory Visit: Payer: Self-pay | Admitting: Family Medicine

## 2013-07-19 ENCOUNTER — Encounter: Payer: Self-pay | Admitting: Neurology

## 2013-07-19 ENCOUNTER — Ambulatory Visit (INDEPENDENT_AMBULATORY_CARE_PROVIDER_SITE_OTHER): Payer: Medicare HMO | Admitting: Neurology

## 2013-07-19 VITALS — BP 140/80 | HR 68 | Resp 16 | Ht 65.0 in | Wt 163.0 lb

## 2013-07-19 DIAGNOSIS — G250 Essential tremor: Secondary | ICD-10-CM

## 2013-07-19 DIAGNOSIS — R740 Nonspecific elevation of levels of transaminase and lactic acid dehydrogenase [LDH]: Secondary | ICD-10-CM

## 2013-07-19 DIAGNOSIS — R7401 Elevation of levels of liver transaminase levels: Secondary | ICD-10-CM

## 2013-07-19 DIAGNOSIS — G252 Other specified forms of tremor: Secondary | ICD-10-CM

## 2013-07-19 MED ORDER — TOPIRAMATE 25 MG PO TABS: ORAL_TABLET | ORAL | Status: DC

## 2013-07-19 MED ORDER — TOPIRAMATE 100 MG PO TABS: 100.0000 mg | ORAL_TABLET | Freq: Every day | ORAL | Status: DC

## 2013-07-19 NOTE — Patient Instructions (Signed)
1.  Start topamax 25 mg as follows:  1 tablet daily for 1 week, then 2 tablets daily for 1 week and then 3 tablets daily for 1 week and then start the topamax 100 mg - 1 pill daily thereafter 2.  Over the counter vitamin B6 and also vit c (orange juice) can help the numbness and tingling

## 2013-07-19 NOTE — Progress Notes (Signed)
Subjective:    Michelle Donovan was seen in consultation in the movement disorder clinic at the request of Eliezer Lofts, MD.  The evaluation is for tremor.  Pt has previously seen Dr. Leta Baptist and notes from 2012 were reviewed.  Pt reports that this was just a one time consultation in 2012.   The patient is a 68 y.o. right handed female with a history of tremor.   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  She states that it is in both hands but R is worse than the L (but she uses the right more). There is a family hx of tremor in her mother and brother.  She may use xanax very if going out (less than one time per month) to suppress tremor.    Affected by caffeine:  yes(drinks 8 oz coffee per da) Affected by alcohol:  unknown Affected by stress:  yes Affected by fatigue:  no Spills soup if on spoon:  yes (uses weighted utensils at home for this) Spills glass of liquid if full:  yes Affects ADL's (tying shoes, brushing teeth, etc):  no except trouble with eyeliner  Current/Previously tried tremor medications: topamax - 50 mg x 1 month (paresthesias);  Metoprolol - felt drained ; primidone (50 mg - 1/2 at night but got first dose effect and stopped it); meclizine (? Why); on propranolol now - ? Helping now  Current medications that may exacerbate tremor:  n/a  Outside reports reviewed: historical medical records and referral letter/letters.  Allergies  Allergen Reactions  . Codeine     REACTION: vomiting    Current Outpatient Prescriptions on File Prior to Visit  Medication Sig Dispense Refill  . ALPRAZolam (XANAX) 0.25 MG tablet Take 1 tablet (0.25 mg total) by mouth daily as needed for anxiety (and tremor).  30 tablet  0  . cetirizine (ZYRTEC) 10 MG tablet Take 10 mg by mouth daily.      . cholecalciferol (VITAMIN D) 1000 UNITS tablet Take 1,000 Units by mouth daily.      . fluticasone (FLONASE) 50 MCG/ACT  nasal spray Place 2 sprays into both nostrils daily as needed for allergies or rhinitis.      Marland Kitchen montelukast (SINGULAIR) 10 MG tablet TAKE 1 TABLET BY MOUTH AT BEDTIME  30 tablet  5  . propranolol ER (INDERAL LA) 120 MG 24 hr capsule Take 1 capsule (120 mg total) by mouth daily.  30 capsule  11  . vitamin B-12 (CYANOCOBALAMIN) 1000 MCG tablet Take 1,000 mcg by mouth daily.        No current facility-administered medications on file prior to visit.    Past Medical History  Diagnosis Date  . Dermatophytosis of scalp and beard   . Mixed hyperlipidemia   . Lumbago     bulging disk per MRI   . Irritable bowel syndrome   . Essential and other specified forms of tremor   . Allergic rhinitis, cause unspecified   . Unspecified asthma(493.90)   . HTN (hypertension)   . Other acute reactions to stress   . Other diseases of lung, not elsewhere classified     solitary pulm. nodule(left)  . Unspecified vitamin D deficiency   . Diverticulosis     Past Surgical History  Procedure Laterality Date  . Laparoscopic total hysterectomy  2002  . Tubal ligation  1986  . Tonsillectomy  1967/68    History   Social  History  . Marital Status: Divorced    Spouse Name: N/A    Number of Children: 1  . Years of Education: N/A   Occupational History  . 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 Smoking status: Former Smoker    Quit date: 05/17/1980  . Smokeless tobacco: Never Used  . Alcohol Use: No  . Drug Use: No  . Sexual Activity: Not on file   Other Topics Concern  . Not on file   Social History Narrative   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[employer]      Divorced      1 daughter-healthy      No regular exercise      Some veggies; rare fruit          Family Status  Relation Status Death Age  . Mother Deceased 52    CHF, Essential Tremor  . Father Deceased     old age, heart disease  . Brother Alive     diabetes  . Brother Alive     COPD,  heart disease  . Brother Alive     Benign Essential Tremor  . Sister Alive     healthy  . Daughter Alive     anxiety    Review of Systems A complete 10 system ROS was obtained and was negative apart from what is mentioned.   Objective:   VITALS:   Filed Vitals:   07/19/13 0844  BP: 140/80  Pulse: 68  Resp: 16  Height: 5\' 5"  (1.651 m)  Weight: 163 lb (73.936 kg)   Gen:  Appears stated age and in NAD. HEENT:  Normocephalic, atraumatic. The mucous membranes are moist. The superficial temporal arteries are without ropiness or tenderness. Cardiovascular: Regular rate and rhythm. Lungs: Clear to auscultation bilaterally. Neck: There are no carotid bruits noted bilaterally.  NEUROLOGICAL:  Orientation:  The patient is alert and oriented x 3.  Recent and remote memory are intact.  Attention span and concentration are normal.  Able to name objects and repeat without trouble.  Fund of knowledge is appropriate Cranial nerves: There is good facial symmetry. The pupils are equal round and reactive to light bilaterally. Fundoscopic exam reveals clear disc margins bilaterally. Extraocular muscles are intact and visual fields are full to confrontational testing. Speech is fluent and clear. Soft palate rises symmetrically and there is no tongue deviation. Hearing is intact to conversational tone. Tone: Tone is good throughout. Sensation: Sensation is intact to light touch and pinprick throughout (facial, trunk, extremities). Vibration is intact at the bilateral big toe. There is no extinction with double simultaneous stimulation. There is no sensory dermatomal level identified. Coordination:  The patient has no dysdiadichokinesia or dysmetria. Motor: Strength is 5/5 in the bilateral upper and lower extremities.  Shoulder shrug is equal bilaterally.  There is no pronator drift.  There are no fasciculations noted. DTR's: Deep tendon reflexes are 2-/4 at the bilateral biceps, triceps,  brachioradialis, patella and achilles.  Plantar responses are downgoing bilaterally. Gait and Station: The patient is able to ambulate without difficulty. The patient is able to heel toe walk without any difficulty. The patient is able to ambulate in a tandem fashion. The patient is able to stand in the Romberg position.   MOVEMENT EXAM: Tremor:  There is tremor in the UE, noted most significantly with action.  The R hand intention tremor is significantly worse than the L.  A minor head  tremor in the "yes" direction is noted.  A tremor can be felt in the legs but barely seen. The patient has mild trouble with Archimedes spiral  There is no tremor at rest.  The patient is has minor trouble pouring water from one glass to another without spilling it.  Labs:  Lab Results  Component Value Date   TSH 1.21 12/07/2012     Chemistry      Component Value Date/Time   NA 137 04/08/2013 2034   K 3.9 04/08/2013 2034   CL 101 04/08/2013 2034   CO2 25 04/08/2013 2034   BUN 12 04/08/2013 2034   CREATININE 0.71 04/08/2013 2034      Component Value Date/Time   CALCIUM 9.1 04/08/2013 2034   ALKPHOS 138* 04/08/2013 2034   AST 48* 04/08/2013 2034   ALT 70* 04/08/2013 2034   BILITOT 0.2* 04/08/2013 2034        Lab Results  Component Value Date   WBC 8.6 04/08/2013   HGB 11.6* 04/08/2013   HCT 34.6* 04/08/2013   MCV 88.7 04/08/2013   PLT 190 04/08/2013      Assessment/Plan:   1.  Essential Tremor.  -This is moderate now in the R hand.  L hand is fairly mild.  The patient and I discussed various treatment options.  She had a first dose effect with the Mysoline, and even though that only happens with the first dose, she really does not want to try that again.  -We did decide to retry Topamax.  I think that we can deal with the paresthesias with vitamin supplementation and also, it generally goes away with time.  She was likely on too low of a dosage for not long enough time to see effect last  time she tried it.  We will work up to 100 mg daily.  Risks, benefits, side effects and alternative therapies were discussed.  The opportunity to ask questions was given and they were answered to the best of my ability.  The patient expressed understanding and willingness to follow the outlined treatment protocols.  -We discussed klonopin but decided to hold on it for now.  -We discussed DBS and pt education was provided.  Greater than 50% in counseling.  Visit time 60 min 2.  Transaminasemia.  -LFT's elevated in November.  Will recheck next visit if not already done but was having acute gallbladder attack when these were drawn in ED 3.  Return in about 3 months (around 10/19/2013).

## 2013-08-08 ENCOUNTER — Encounter: Payer: Self-pay | Admitting: Family Medicine

## 2013-08-08 ENCOUNTER — Ambulatory Visit (INDEPENDENT_AMBULATORY_CARE_PROVIDER_SITE_OTHER): Payer: Medicare HMO | Admitting: Family Medicine

## 2013-08-08 VITALS — BP 118/68 | HR 59 | Temp 97.5°F | Ht 65.5 in | Wt 160.5 lb

## 2013-08-08 DIAGNOSIS — M79602 Pain in left arm: Principal | ICD-10-CM

## 2013-08-08 DIAGNOSIS — M79609 Pain in unspecified limb: Secondary | ICD-10-CM

## 2013-08-08 DIAGNOSIS — M79601 Pain in right arm: Secondary | ICD-10-CM | POA: Insufficient documentation

## 2013-08-08 DIAGNOSIS — M542 Cervicalgia: Secondary | ICD-10-CM | POA: Insufficient documentation

## 2013-08-08 LAB — SEDIMENTATION RATE: Sed Rate: 37 mm/hr — ABNORMAL HIGH (ref 0–22)

## 2013-08-08 LAB — CK: CK TOTAL: 31 U/L (ref 7–177)

## 2013-08-08 NOTE — Progress Notes (Signed)
68 year old female presents for continued shoulder, neck  And B arm pain. Seen in 05/2013 for similar. Cervical X ray showed mild degenerative changes.  Diclofenac has helped a little with pain.  She has also been having constant B upper arm pain ongoing for many  months. Worse in last few weeks in arms. Neck and shoulders better than in 05/2013. No known injury.  Extends up to shoulders, up back of neck and down to elbows  No pain at rest, only notes with movement. Pain with int rotation of B shoulder.  Pain when she moves her neck. Hears clicking and popping when moves neck  Muscles feel stiff. Pain with massage in neck.  Wakes her up at night when she moves.  No numbness. Occ hands going to sleep.  She has tried advil, helps minimally.  She previously thought it was due to liptior. She stopped lipitor in 2 weeks, with no benefit.    Review of Systems  Constitutional: Negative for fever and fatigue.  HENT: Negative for ear pain.  Eyes: Negative for pain.  Respiratory: Negative for chest tightness and shortness of breath.  Cardiovascular: Negative for chest pain, palpitations and leg swelling.  Gastrointestinal: Negative for abdominal pain.  Genitourinary: Negative for dysuria.  Objective:   Physical Exam  Constitutional: She is oriented to person, place, and time. Vital signs are normal. She appears well-developed and well-nourished. She is cooperative. Non-toxic appearance. She does not appear ill. No distress.  HENT:  Head: Normocephalic.  Right Ear: Hearing, tympanic membrane, external ear and ear canal normal. Tympanic membrane is not erythematous, not retracted and not bulging.  Left Ear: Hearing, tympanic membrane, external ear and ear canal normal. Tympanic membrane is not erythematous, not retracted and not bulging.  Nose: No mucosal edema or rhinorrhea. Right sinus exhibits no maxillary sinus tenderness and no frontal sinus tenderness. Left sinus exhibits no maxillary sinus  tenderness and no frontal sinus tenderness.  Mouth/Throat: Uvula is midline, oropharynx is clear and moist and mucous membranes are normal.  Eyes: Conjunctivae, EOM and lids are normal. Pupils are equal, round, and reactive to light. Lids are everted and swept, no foreign bodies found.  Neck: Trachea normal and normal range of motion. Neck supple. Carotid bruit is not present. No mass and no thyromegaly present.  Cardiovascular: Normal rate, regular rhythm, S1 normal, S2 normal, normal heart sounds, intact distal pulses and normal pulses. Exam reveals no gallop and no friction rub.  No murmur heard.  Pulmonary/Chest: Effort normal and breath sounds normal. Not tachypneic. No respiratory distress. She has no decreased breath sounds. She has no wheezes. She has no rhonchi. She has no rales.  Abdominal: Soft. Normal appearance and bowel sounds are normal. There is no tenderness.  Musculoskeletal:  No clearly positive spurlings B. Decreased ROM in neck, crepitus in neck woith lateral rotations  Full ROM in B shoulders, but slowed, pain in upper shoulder woith int rotation of shoulders Neg Neer's, neg drop arm test  Neurological: She is alert and oriented to person, place, and time. She has normal strength and normal reflexes. She displays tremor. No cranial nerve deficit or sensory deficit. She exhibits normal muscle tone. She displays a negative Romberg sign. Coordination and gait normal. GCS eye subscore is 4. GCS verbal subscore is 5. GCS motor subscore is 6.  Nml cerebellar exam  No papilledema  Skin: Skin is warm, dry and intact. No rash noted.  Psychiatric: She has a normal mood and affect. Her  speech is normal and behavior is normal. Judgment and thought content normal. Her mood appears not anxious. Cognition and memory are normal. Cognition and memory are not impaired. She does not exhibit a depressed mood. She exhibits normal recent memory and normal remote memory.

## 2013-08-08 NOTE — Progress Notes (Signed)
Pre visit review using our clinic review tool, if applicable. No additional management support is needed unless otherwise documented below in the visit note. 

## 2013-08-08 NOTE — Patient Instructions (Addendum)
Can use ibuprofen 800 mg every 8 hours for pain if needed. Call if pain worsening. Stop at lab and front desk for testing.

## 2013-08-08 NOTE — Assessment & Plan Note (Signed)
Concerning for spinal stenosis. Will proceed with MRi neck.  Will also eval for myositis given B pain.. With sed rate and CK.

## 2013-08-15 ENCOUNTER — Ambulatory Visit
Admission: RE | Admit: 2013-08-15 | Discharge: 2013-08-15 | Disposition: A | Discharge status: Home / Self Care | Payer: Medicare HMO | Source: Ambulatory Visit | Attending: Family Medicine | Admitting: Family Medicine

## 2013-08-15 DIAGNOSIS — M542 Cervicalgia: Secondary | ICD-10-CM

## 2013-08-15 DIAGNOSIS — M79602 Pain in left arm: Principal | ICD-10-CM

## 2013-08-15 DIAGNOSIS — M79601 Pain in right arm: Secondary | ICD-10-CM

## 2013-08-24 ENCOUNTER — Ambulatory Visit (INDEPENDENT_AMBULATORY_CARE_PROVIDER_SITE_OTHER): Payer: Medicare HMO | Admitting: Family Medicine

## 2013-08-24 ENCOUNTER — Encounter: Payer: Self-pay | Admitting: Family Medicine

## 2013-08-24 VITALS — BP 120/70 | HR 61 | Temp 98.2°F | Ht 65.5 in | Wt 157.8 lb

## 2013-08-24 DIAGNOSIS — M79602 Pain in left arm: Principal | ICD-10-CM

## 2013-08-24 DIAGNOSIS — E0789 Other specified disorders of thyroid: Secondary | ICD-10-CM

## 2013-08-24 DIAGNOSIS — M542 Cervicalgia: Secondary | ICD-10-CM

## 2013-08-24 DIAGNOSIS — M79601 Pain in right arm: Secondary | ICD-10-CM

## 2013-08-24 DIAGNOSIS — E079 Disorder of thyroid, unspecified: Secondary | ICD-10-CM

## 2013-08-24 DIAGNOSIS — M79609 Pain in unspecified limb: Secondary | ICD-10-CM

## 2013-08-24 DIAGNOSIS — E042 Nontoxic multinodular goiter: Secondary | ICD-10-CM

## 2013-08-24 MED ORDER — MELOXICAM 15 MG PO TABS: 15.0000 mg | ORAL_TABLET | Freq: Every day | ORAL | Status: DC

## 2013-08-24 MED ORDER — TRAMADOL HCL 50 MG PO TABS: 50.0000 mg | ORAL_TABLET | Freq: Three times a day (TID) | ORAL | Status: DC | PRN

## 2013-08-24 NOTE — Progress Notes (Signed)
Pre visit review using our clinic review tool, if applicable. No additional management support is needed unless otherwise documented below in the visit note. 

## 2013-08-24 NOTE — Patient Instructions (Addendum)
Use tramadol for  Break through pain.  Use meloxicam daily.  Stop at front desk for referral to rheumatologist Dr. Estanislado Pandy and [place].

## 2013-08-24 NOTE — Progress Notes (Signed)
Subjective:    Patient ID: Michelle Donovan, female    DOB: 07-30-1945, 68 y.o.   MRN: [id number]  HPI  68 year old female presents for continued shoulder, neck And B arm pain.  Seen in 05/2013 for similar.  Cervical X ray showed mild degenerative changes.  Diclofenac has helped a little with pain.  She has also been having constant B upper arm pain ongoing for many months. Worse in last few weeks in arms. Neck and shoulders better than in 05/2013.  No known injury.  Extends up to shoulders, up back of neck and down to elbows  No pain at rest, only notes with movement. Pain with int rotation of B shoulder.  Pain when she moves her neck. Hears clicking and popping when moves neck  Muscles feel stiff. Pain with massage in neck.  Wakes her up at night when she moves.  No numbness. Occ hands going to sleep.  She has tried advil, helps minimally.  She previously thought it was due to liptior. She stopped lipitor in 2 weeks, with no benefit.   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 Ultrasound suggested for further  evaluation. This recommendation follows consensus guidelines.  Sed rate was normal CK normal.  She cannot function. She now has pain in hands and wrist joints. She feel like pain in veins. Now occ in legs. Pain in collar bones. No redness or swelling in any joints.  Using 800 mg of advil at night.  She is tearful and frustrated about the pain.  No family history of rheum disease.     Review of Systems  Constitutional: Negative for fever and fatigue.  HENT: Negative for ear pain.   Eyes: Negative for pain.  Respiratory: Negative for chest tightness and shortness of breath.     Cardiovascular: Negative for chest pain, palpitations and leg swelling.  Gastrointestinal: Negative for abdominal pain.  Genitourinary: Negative for dysuria.       Objective:   Physical Exam  Constitutional: She is oriented to person, place, and time. Vital signs are normal. She appears well-developed and well-nourished. She is cooperative.  Non-toxic appearance. She does not appear ill. No distress.  HENT:  Head: Normocephalic.  Right Ear: Hearing, tympanic membrane, external ear and ear canal normal. Tympanic membrane is not erythematous, not retracted and not bulging.  Left Ear: Hearing, tympanic membrane, external ear and ear canal normal. Tympanic membrane is not erythematous, not retracted and not bulging.  Nose: No mucosal edema or rhinorrhea. Right sinus exhibits no maxillary sinus tenderness and no frontal sinus tenderness. Left sinus exhibits no maxillary sinus tenderness and no frontal sinus tenderness.  Mouth/Throat: Uvula is midline, oropharynx is clear and moist and mucous membranes are normal.  Eyes: Conjunctivae, EOM and lids are normal. Pupils are equal, round, and reactive to light. Lids are everted and swept, no foreign bodies found.  Neck: Trachea normal and normal range of motion. Neck supple. Carotid bruit is not present. No mass and no thyromegaly present.  Cardiovascular: Normal rate, regular rhythm, S1 normal, S2 normal, normal heart sounds, intact distal pulses and normal pulses.  Exam reveals no gallop and no friction rub.   No murmur  heard. Pulmonary/Chest: Effort normal and breath sounds normal. Not tachypneic. No respiratory distress. She has no decreased breath sounds. She has no wheezes. She has no rhonchi. She has no rales.  Abdominal: Soft. Normal appearance and bowel sounds are normal. There is no tenderness.  Musculoskeletal:  No clearly positive spurlings B.  Decreased ROM in neck, crepitus in neck woith lateral rotations  Full ROM in B shoulders, but  slowed, pain in upper shoulder woith int rotation of shoulders  Neg Neer's, neg drop arm test  Neurological: She is alert and oriented to person, place, and time. She has normal strength and normal reflexes. She displays tremor. No cranial nerve deficit or sensory deficit. She exhibits normal muscle tone. She displays a negative Romberg sign. Coordination and gait normal. GCS eye subscore is 4. GCS verbal subscore is 5. GCS motor subscore is 6.  Nml cerebellar exam   No papilledema  Skin: Skin is warm, dry and intact. No rash noted.  Psychiatric: Her speech is normal and behavior is normal. Judgment and thought content normal. Her mood appears not anxious. Her affect is labile. Cognition and memory are normal. Cognition and memory are not impaired. She exhibits a depressed mood. She exhibits normal recent memory and normal remote memory.     She has 10 positive trigger point in upper body.     Assessment & Plan:

## 2013-08-28 ENCOUNTER — Ambulatory Visit
Admission: RE | Admit: 2013-08-28 | Discharge: 2013-08-28 | Disposition: A | Discharge status: Home / Self Care | Payer: Medicare HMO | Source: Ambulatory Visit | Attending: Family Medicine | Admitting: Family Medicine

## 2013-08-28 DIAGNOSIS — E0789 Other specified disorders of thyroid: Secondary | ICD-10-CM

## 2013-08-28 DIAGNOSIS — E079 Disorder of thyroid, unspecified: Secondary | ICD-10-CM

## 2013-08-29 ENCOUNTER — Telehealth: Payer: Self-pay | Admitting: Family Medicine

## 2013-08-29 DIAGNOSIS — E042 Nontoxic multinodular goiter: Secondary | ICD-10-CM | POA: Insufficient documentation

## 2013-08-29 NOTE — Telephone Encounter (Signed)
Order entered

## 2013-08-30 ENCOUNTER — Other Ambulatory Visit (INDEPENDENT_AMBULATORY_CARE_PROVIDER_SITE_OTHER): Payer: Medicare HMO

## 2013-08-30 DIAGNOSIS — E0789 Other specified disorders of thyroid: Secondary | ICD-10-CM

## 2013-08-30 LAB — T4, FREE: FREE T4: 1.04 ng/dL (ref 0.60–1.60)

## 2013-08-30 LAB — T3, FREE: T3, Free: 2.4 pg/mL (ref 2.3–4.2)

## 2013-08-30 LAB — TSH: TSH: 1.32 u[IU]/mL (ref 0.35–5.50)

## 2013-09-05 NOTE — Assessment & Plan Note (Addendum)
Neg MRI c-spine ( no central canal stenosis, some disc protrusion on right that cause right symptoms but not left), no sign of muscle issue Given multiple trigger points.. ? Fibromyalgia.  Referral to rheum for further eval.  Conitnue NSAID and add tramadol for pain.

## 2013-09-05 NOTE — Assessment & Plan Note (Signed)
Rec thyroid US and likely referral to ENDO.

## 2013-09-07 ENCOUNTER — Ambulatory Visit (INDEPENDENT_AMBULATORY_CARE_PROVIDER_SITE_OTHER): Payer: Medicare HMO | Admitting: Internal Medicine

## 2013-09-07 ENCOUNTER — Encounter: Payer: Self-pay | Admitting: Internal Medicine

## 2013-09-07 VITALS — BP 118/68 | HR 61 | Temp 98.0°F | Resp 12 | Ht 65.5 in | Wt 159.0 lb

## 2013-09-07 DIAGNOSIS — E042 Nontoxic multinodular goiter: Secondary | ICD-10-CM

## 2013-09-07 NOTE — Progress Notes (Signed)
Patient ID: Michelle Donovan, female   DOB: 04/30/1946, 68 y.o.   MRN: [id number]   HPI  Michelle Donovan is a 68 y.o.-year-old female, referred by her PCP, Dr. 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  Pt has had an MRI of her cervical spine to investigate bilateral upper arm pain >> a thyroid nodule was incidentally found.  Thyroid U/S (08/2013): a large R complex nodule (predominantly cystic) 1.4 x 1.2 x 1.7 cm. Other smaller nodules throughout R and L lobes.  I reviewed pt's thyroid tests: Lab Results  Component Value Date   TSH 1.32 08/30/2013   TSH 1.21 12/07/2012   TSH 1.30 09/23/2010   TSH 1.469 08/12/2008   FREET4 1.04 08/30/2013    Pt denies feeling nodules in neck, hoarseness, dysphagia/odynophagia, SOB with lying down. She has a little trouble swallowing her meds (has to swallow several times). She has a constant cough >> she thought this was from allergies. Cough present throughout the day and it is worse.   Pt c/o: - + cold intolerance/+ hot flushes - + fatigue - no tremors - no palpitations - no anxiety/depression - no hyperdefecation/constipation - has had constipation in the past - no weight loss/weight gain recently, but gained weight in last 5 years >> truncal - no dry skin - no hair falling  Pt does have a FH of thyroid ds >> mother with large goiter. No FH of thyroid cancer. No h/o radiation tx to head or neck.  No iodine supplements.  ROS: Constitutional: see HPI Eyes: no blurry vision, no xerophthalmia ENT: no sore throat, no nodules palpated in throat, + pill dysphagia/no odynophagia, no hoarseness; + tinnitus Cardiovascular: no CP/SOB/palpitations/leg swelling Respiratory: + cough/no SOB Gastrointestinal: no N/V/D/C/+ heartburn Musculoskeletal: + both: muscle/joint aches Skin: no rashes, + easy bruising Neurological: + tremors/no numbness/tingling/dizziness, + HA Psychiatric: no depression/anxiety  Past Medical History  Diagnosis Date  .  Dermatophytosis of scalp and beard   . Mixed hyperlipidemia   . Lumbago     bulging disk per MRI   . Irritable bowel syndrome   . Essential and other specified forms of tremor   . Allergic rhinitis, cause unspecified   . Unspecified asthma(493.90)   . HTN (hypertension)   . Other acute reactions to stress   . Other diseases of lung, not elsewhere classified     solitary pulm. nodule(left)  . Unspecified vitamin D deficiency   . Diverticulosis    Past Surgical History  Procedure Laterality Date  . Laparoscopic total hysterectomy  2002  . Tubal ligation  1986  . Tonsillectomy  1967/68  . Laparoscopic unilateral salpingo oopherectomy     History   Social History  . Marital Status: Divorced    Spouse Name: N/A    Number of Children: 1  . Years of Education: N/A   Occupational History  . retired Other    [place]  .     Social History Main Topics  . Smoking status: Former Smoker    Quit date: 05/17/1980  . Smokeless tobacco: Never Used  . Alcohol Use: No  . Drug Use: No  . Sexual Activity: Not on file   Other Topics Concern  . 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 ALPRAZolam (XANAX) 0.25 MG tablet Take 1 tablet (0.25 mg total) by mouth daily as needed for anxiety (and tremor).  30 tablet  0  . cetirizine (ZYRTEC) 10 MG tablet Take 10 mg by mouth daily.      . cholecalciferol (VITAMIN D) 1000 UNITS tablet Take 1,000 Units by mouth daily.      . fluticasone (FLONASE) 50 MCG/ACT nasal spray Place 2 sprays into both nostrils daily as needed for allergies or rhinitis.      . meloxicam (MOBIC) 15 MG tablet Take 1 tablet (15 mg total) by mouth daily.  30 tablet  0  . montelukast (SINGULAIR) 10 MG tablet TAKE 1 TABLET BY MOUTH AT BEDTIME   30 tablet  5  . propranolol ER (INDERAL LA) 120 MG 24 hr capsule Take 1 capsule (120 mg total) by mouth daily.  30 capsule  11  . topiramate (TOPAMAX) 100 MG tablet Take 1 tablet (100 mg total) by mouth daily.  30 tablet  3  . traMADol (ULTRAM) 50 MG tablet Take 1 tablet (50 mg total) by mouth every 8 (eight) hours as needed.  30 tablet  0  . vitamin B-12 (CYANOCOBALAMIN) 1000 MCG tablet Take 1,000 mcg by mouth daily.       Marland Kitchen topiramate (TOPAMAX) 25 MG tablet 1 tablet daily for 1 week, then 2 tablets daily for 1 week and then 3 tablets daily for 1 week  42 tablet  0   No current facility-administered medications on file prior to visit.   Allergies  Allergen Reactions  . Codeine     REACTION: vomiting   Family History  Problem Relation Age of Onset  . Other Father     Trigeminal neuralgia  . Stroke Mother   . Tremor Mother   . Diabetes Mother   . Heart failure Mother   . Diabetes Sister   . Coronary artery disease Sister   . Irritable bowel syndrome Sister   . Tremor Sister   . Diabetes Brother     x 3  . Coronary artery disease Brother     x 3  . Irritable bowel syndrome Brother     x 3  . Tremor Brother     x 3  . Breast cancer      Aunts and cousin  . Colon cancer Maternal Grandfather    PE: BP 118/68  Pulse 61  Temp(Src) 98 F (36.7 C) (Oral)  Resp 12  Ht 5' 5.5" (1.664 m)  Wt 159 lb (72.122 kg)  BMI 26.05 kg/m2  SpO2 97% Wt Readings from Last 3 Encounters:  09/07/13 159 lb (72.122 kg)  08/24/13 157 lb 12 oz (71.555 kg)  08/08/13 160 lb 8 oz (72.802 kg)   Constitutional: overweight - central distribution, in NAD Eyes: PERRLA, EOMI, no exophthalmos ENT: moist mucous membranes, no thyromegaly and no thyroid nodules palpated, no cervical lymphadenopathy Cardiovascular: RRR, No MRG Respiratory: CTA B Gastrointestinal: abdomen soft, NT, ND, BS+ Musculoskeletal: no deformities, strength in arms could not be evaluated 2/2 bilateral upper arm pain Skin: moist,  warm, no rashes Neurological: + mild tremor with outstretched hands and head, DTR normal in all 4  ASSESSMENT: 1. MNG - thyroid U/S (08/29/2013):  Right thyroid lobe Measurements: 5.0 x 2.0 x 1.7 cm. Multiple right-sided nodules are present. The largest is a complex cystic nodule with a thick septation measuring 1.4 x 1.2 x  1.7 cm smaller solid nodules are present. The largest demonstrates a maximal diameter of 1.1 cm.   Left thyroid lobe Measurements: 4.3 x 1.7 x 1.7 cm. 1.1 cm left interpolar region nodule. Other smaller nodules are noted.   Isthmus Thickness: 6 mm in thickness. No nodules visualized.   Lymphadenopathy None visualized.    PLAN: 1. MNG  - I reviewed the images of her thyroid ultrasound along with the patient. I pointed out that the dominant nodule is not very large, and is almost entirely cystic. There is a septation crossing the cyst, but not a lot of solid material. This confers a low risk for cancer. The rest of the nodules are small, without internal blood flow, more wide than tall, and well delimited from surrounding tissue. Pt does not have a thyroid cancer family history or a personal history of RxTx to head/neck. All these would favor benignity. I believe her risk of cancer is less than 10-15%.  - she has a cough, but unclear if from the larger cyst >> I suggested that we can aspirate the cyst to see if this would improve her cough, but she refuses. - We discussed about other options, to wait for a year and see if the nodule grows, and only intervene at that time.  - she should let me know if she develops neck compression symptoms, in that case, we might need to aspirate then - I'll see her back in a year

## 2013-09-07 NOTE — Patient Instructions (Signed)
Please return in 1 year. In the meantime, let me know if you have chocking, shortness of breath, problems swallowing.

## 2013-10-05 ENCOUNTER — Ambulatory Visit (INDEPENDENT_AMBULATORY_CARE_PROVIDER_SITE_OTHER): Payer: Medicare HMO | Admitting: Family Medicine

## 2013-10-05 ENCOUNTER — Encounter: Payer: Self-pay | Admitting: Family Medicine

## 2013-10-05 VITALS — BP 130/64 | HR 61 | Temp 98.2°F | Ht 65.5 in | Wt 155.5 lb

## 2013-10-05 DIAGNOSIS — K7689 Other specified diseases of liver: Secondary | ICD-10-CM

## 2013-10-05 DIAGNOSIS — R945 Abnormal results of liver function studies: Principal | ICD-10-CM

## 2013-10-05 DIAGNOSIS — M79609 Pain in unspecified limb: Secondary | ICD-10-CM

## 2013-10-05 DIAGNOSIS — H109 Unspecified conjunctivitis: Secondary | ICD-10-CM

## 2013-10-05 DIAGNOSIS — R7989 Other specified abnormal findings of blood chemistry: Secondary | ICD-10-CM

## 2013-10-05 DIAGNOSIS — M79602 Pain in left arm: Secondary | ICD-10-CM

## 2013-10-05 DIAGNOSIS — K760 Fatty (change of) liver, not elsewhere classified: Secondary | ICD-10-CM

## 2013-10-05 DIAGNOSIS — M79601 Pain in right arm: Secondary | ICD-10-CM

## 2013-10-05 MED ORDER — NEOMYCIN-POLYMYXIN-HC OP SUSP: OPHTHALMIC | Status: DC

## 2013-10-05 NOTE — Progress Notes (Signed)
   Subjective:    Patient ID: Michelle Donovan, female    DOB: 05/31/1945, 68 y.o.   MRN: [id number]  Conjunctivitis  Pertinent negatives include no fever, no abdominal pain, no ear pain and no eye pain.      Review of Systems  Constitutional: Negative for fever and fatigue.  HENT: Negative for ear pain.   Eyes: Negative for pain.  Respiratory: Negative for chest tightness and shortness of breath.   Cardiovascular: Negative for chest pain, palpitations and leg swelling.  Gastrointestinal: Negative for abdominal pain.  Genitourinary: Negative for dysuria.       Objective:   Physical Exam  Constitutional: Vital signs are normal. She appears well-developed and well-nourished. She is cooperative.  Non-toxic appearance. She does not appear ill. No distress.  HENT:  Head: Normocephalic.  Right Ear: Hearing, tympanic membrane, external ear and ear canal normal. Tympanic membrane is not erythematous, not retracted and not bulging.  Left Ear: Hearing, tympanic membrane, external ear and ear canal normal. Tympanic membrane is not erythematous, not retracted and not bulging.  Nose: No mucosal edema or rhinorrhea. Right sinus exhibits no maxillary sinus tenderness and no frontal sinus tenderness. Left sinus exhibits no maxillary sinus tenderness and no frontal sinus tenderness.  Mouth/Throat: Uvula is midline, oropharynx is clear and moist and mucous membranes are normal.  Eyes: EOM and lids are normal. Pupils are equal, round, and reactive to light. Lids are everted and swept, no foreign bodies found. Right conjunctiva is injected. Right conjunctiva has no hemorrhage. Left conjunctiva is not injected. Left conjunctiva has no hemorrhage.  Yellow mucus right eye.  Neck: Trachea normal and normal range of motion. Neck supple. Carotid bruit is not present. No mass and no thyromegaly present.  Cardiovascular: Normal rate, regular rhythm, S1 normal, S2 normal, normal heart sounds, intact distal  pulses and normal pulses.  Exam reveals no gallop and no friction rub.   No murmur heard. Pulmonary/Chest: Effort normal and breath sounds normal. Not tachypneic. No respiratory distress. She has no decreased breath sounds. She has no wheezes. She has no rhonchi. She has no rales.  Abdominal: Soft. Normal appearance and bowel sounds are normal. There is no tenderness.  Musculoskeletal:  Decreased ROM in B arms and shoulders  Neurological: She is alert.  Skin: Skin is warm, dry and intact. No rash noted.  Psychiatric: Her speech is normal and behavior is normal. Judgment and thought content normal. Her mood appears not anxious. Cognition and memory are normal. She does not exhibit a depressed mood.          Assessment & Plan:

## 2013-10-05 NOTE — Assessment & Plan Note (Signed)
IMproved with treatment of capsulitis and PT.

## 2013-10-05 NOTE — Assessment & Plan Note (Signed)
:  Likely viral. Symptom care, if not improving as expected, will given Rx for antibacterial drops to start given long weekend.

## 2013-10-05 NOTE — Assessment & Plan Note (Signed)
Likely cause of elevated LFTs. Work on weight loss, low fat diet anfd exercise as tolerated.

## 2013-10-05 NOTE — Assessment & Plan Note (Signed)
Likely due to fatty live. Rule out acute hep. Due for re-eval.

## 2013-10-05 NOTE — Progress Notes (Signed)
   Subjective:    Patient ID: Michelle Donovan, female    DOB: Dec 31, 1945, 68 y.o.   MRN: [id number]  Conjunctivitis  The current episode started yesterday. The problem occurs continuously. The problem has been gradually worsening. The problem is moderate. Associated symptoms include eye itching, rhinorrhea, swollen glands, eye discharge and eye redness. Pertinent negatives include no fever, no decreased vision, no double vision, no congestion, no ear discharge, no ear pain, no headaches, no sore throat and no eye pain. The eye pain is mild. The right eye is affected.The eyelid exhibits no abnormality. There were sick contacts at home.     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  Has steroid injection in left shoulder. Started on PT. She has noted improvement in pain... No further arm pain.   Elevated LFTs, mild: US showed fatty liver.  Due for re-eval. Never had acute hep panel done.   Wt Readings from Last 3 Encounters:  10/05/13 155 lb 8 oz (70.534 kg)  09/07/13 159 lb (72.122 kg)  08/24/13 157 lb 12 oz (71.555 kg)          Review of Systems  Constitutional: Negative for fever.  HENT: Positive for rhinorrhea. Negative for congestion, ear discharge, ear pain and sore throat.   Eyes: Positive for discharge, redness and itching. Negative for double vision and pain.  Neurological: Negative for headaches.       Objective:   Physical Exam        Assessment & Plan:

## 2013-10-05 NOTE — Progress Notes (Signed)
Pre visit review using our clinic review tool, if applicable. No additional management support is needed unless otherwise documented below in the visit note. 

## 2013-10-05 NOTE — Patient Instructions (Addendum)
Stop at lab on way out. Start topical saline drops for comfort, warm compresses. If not improving in 48-72 hours, can start topical antibiotic drops.

## 2013-10-06 LAB — COMPREHENSIVE METABOLIC PANEL
ALK PHOS: 103 U/L (ref 39–117)
ALT: 25 U/L (ref 0–35)
AST: 15 U/L (ref 0–37)
Albumin: 3.7 g/dL (ref 3.5–5.2)
BILIRUBIN TOTAL: 0.3 mg/dL (ref 0.2–1.2)
BUN: 12 mg/dL (ref 6–23)
CALCIUM: 9.1 mg/dL (ref 8.4–10.5)
CHLORIDE: 107 meq/L (ref 96–112)
CO2: 23 mEq/L (ref 19–32)
Creat: 0.77 mg/dL (ref 0.50–1.10)
Glucose, Bld: 90 mg/dL (ref 70–99)
Potassium: 3.9 mEq/L (ref 3.5–5.3)
Sodium: 140 mEq/L (ref 135–145)
Total Protein: 6.7 g/dL (ref 6.0–8.3)

## 2013-10-06 LAB — HEPATITIS PANEL, ACUTE
HCV Ab: NEGATIVE
HEP A IGM: NONREACTIVE
HEP B C IGM: NONREACTIVE
Hepatitis B Surface Ag: NEGATIVE

## 2013-10-09 ENCOUNTER — Telehealth: Payer: Self-pay | Admitting: *Deleted

## 2013-10-09 MED ORDER — AZITHROMYCIN 250 MG PO TABS: ORAL_TABLET | ORAL | Status: DC

## 2013-10-09 MED ORDER — HYDROCODONE-HOMATROPINE 5-1.5 MG/5ML PO SYRP: 5.0000 mL | ORAL_SOLUTION | Freq: Every evening | ORAL | Status: DC | PRN

## 2013-10-09 NOTE — Telephone Encounter (Signed)
Called to give Michelle Donovan her lab results.  While on the phone patient states her conjunctivitis has spread to the other eye and has turned into bronchitis.  Complains of deep chest cough, nasal congestion and chills/sweats.  She has been in bed for three days.  Wanting to know if Dr. Diona Browner will send her in something to her pharmacy.  Please advise.

## 2013-10-09 NOTE — Telephone Encounter (Signed)
Sent in rx for antibiotic. If interested , can fax/ pick up rx cough suppressant but may have similar SE as to codeine.

## 2013-10-09 NOTE — Telephone Encounter (Signed)
Michelle Donovan notified antibiotics have been sent to her pharmacy.  Advised Dr. Diona Browner also wrote her a prescription for cough medicine to take at night but it may have the same side effects as she had with codeine.  Michelle Donovan does not wish to try to cough medicine at this time.

## 2013-10-12 MED ORDER — NYSTATIN 100000 UNIT/ML MT SUSP: 5.0000 mL | Freq: Four times a day (QID) | OROMUCOSAL | Status: DC

## 2013-10-12 NOTE — Addendum Note (Signed)
Addended by: Eliezer Lofts E on: 10/12/2013 02:15 PM   Modules accepted: Orders

## 2013-10-12 NOTE — Telephone Encounter (Signed)
Ms. Gothard notified that what she is describing sounds like thrush.  Rx for Nystatin has been sent to her pharmacy.  After using the Nystatin if it doesn't seem to be improving, will need to come in for office visit.

## 2013-10-12 NOTE — Telephone Encounter (Signed)
Sounds like thrush.. Sent in rx for nystatin. If not improving she needs appt to be seen.

## 2013-10-12 NOTE — Telephone Encounter (Addendum)
Pt left v/m; pt is taking zpak now for URI and conjunctivitis; pt has coating on tongue and inside mouth and inside of mouth feels sore. Pt wants to know if Dukes mouthwash could be sent to [facility]. Pt request cb.

## 2013-10-19 ENCOUNTER — Ambulatory Visit: Payer: Medicare HMO | Admitting: Neurology

## 2013-11-13 ENCOUNTER — Encounter: Payer: Self-pay | Admitting: Neurology

## 2013-11-13 ENCOUNTER — Ambulatory Visit (INDEPENDENT_AMBULATORY_CARE_PROVIDER_SITE_OTHER): Payer: Medicare HMO | Admitting: Neurology

## 2013-11-13 VITALS — BP 126/84 | HR 60 | Resp 16 | Ht 65.5 in | Wt 153.0 lb

## 2013-11-13 DIAGNOSIS — R7402 Elevation of levels of lactic acid dehydrogenase (LDH): Secondary | ICD-10-CM

## 2013-11-13 DIAGNOSIS — R7401 Elevation of levels of liver transaminase levels: Secondary | ICD-10-CM

## 2013-11-13 DIAGNOSIS — G250 Essential tremor: Secondary | ICD-10-CM

## 2013-11-13 DIAGNOSIS — G252 Other specified forms of tremor: Secondary | ICD-10-CM

## 2013-11-13 DIAGNOSIS — R740 Nonspecific elevation of levels of transaminase and lactic acid dehydrogenase [LDH]: Secondary | ICD-10-CM

## 2013-11-13 MED ORDER — TOPIRAMATE 100 MG PO TABS: 100.0000 mg | ORAL_TABLET | Freq: Every day | ORAL | Status: DC

## 2013-11-13 NOTE — Progress Notes (Signed)
Subjective:    Michelle Donovan was seen in consultation in the movement disorder clinic at the request of Michelle Lofts, MD.  The evaluation is for tremor.  Pt has previously seen Dr. Leta Baptist and notes from 2012 were reviewed.  Pt reports that this was just a one time consultation in 2012.   The patient is a 68 y.o. right handed female with a history of tremor.   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  She states that it is in both hands but R is worse than the L (but she uses the right more). There is a family hx of tremor in her mother and brother.  She may use xanax very if going out (less than one time per month) to suppress tremor.    Affected by caffeine:  yes(drinks 8 oz coffee per da) Affected by alcohol:  unknown Affected by stress:  yes Affected by fatigue:  no Spills soup if on spoon:  yes (uses weighted utensils at home for this) Spills glass of liquid if full:  yes Affects ADL's (tying shoes, brushing teeth, etc):  no except trouble with eyeliner  11/13/13 update:  The patient returns today for follow up.  She was restarted on topamax last visit.  She was doing markedly better when it was first started but she has recently had some thyroid issues (multinodular goiter), pink eye, bilateral frozen shoulders and URI and tremor got worse during this.  She has been worked up for elevated liver enzymes but then repeat enzymes were normal and hepatitis panel was normal.  She had some paresthesias with the topamax on initiation.  She has lost some weight; no longer able to drink her coke as it tastes poorly with the topamax.    Current/Previously tried tremor medications: topamax;  Metoprolol - felt drained ; primidone (50 mg - 1/2 at night but got first dose effect and stopped it); meclizine (? Why); on propranolol now - ? Helping now  Current medications that may exacerbate tremor:  n/a  Outside reports  reviewed: historical medical records and referral letter/letters.  Allergies  Allergen Reactions  . Codeine     REACTION: vomiting    Current Outpatient Prescriptions on File Prior to Visit  Medication Sig Dispense Refill  . cetirizine (ZYRTEC) 10 MG tablet Take 10 mg by mouth daily.      . cholecalciferol (VITAMIN D) 1000 UNITS tablet Take 1,000 Units by mouth daily.      . montelukast (SINGULAIR) 10 MG tablet TAKE 1 TABLET BY MOUTH AT BEDTIME  30 tablet  5  . propranolol ER (INDERAL LA) 120 MG 24 hr capsule Take 1 capsule (120 mg total) by mouth daily.  30 capsule  11  . triamcinolone (NASACORT ALLERGY 24HR) 55 MCG/ACT AERO nasal inhaler Place 2 sprays into the nose daily.       No current facility-administered medications on file prior to visit.    Past Medical History  Diagnosis Date  . Dermatophytosis of scalp and beard   . Mixed hyperlipidemia   . Lumbago     bulging disk per MRI   . Irritable bowel syndrome   . Essential and other specified forms of tremor   . Allergic rhinitis, cause unspecified   . Unspecified asthma(493.90)   . HTN (hypertension)   . Other acute reactions to stress   . Other diseases of lung, not elsewhere classified  solitary pulm. nodule(left)  . Unspecified vitamin D deficiency   . Diverticulosis     Past Surgical History  Procedure Laterality Date  . Laparoscopic total hysterectomy  2002  . Tubal ligation  1986  . Tonsillectomy  1967/68  . Laparoscopic unilateral salpingo oopherectomy      History   Social History  . Marital Status: Divorced    Spouse Name: N/A    Number of Children: 1  . Years of Education: N/A   Occupational History  . retired Other    [place]  .     Social History Main Topics  . Smoking status: Former Smoker    Quit date: 05/17/1980  . Smokeless tobacco: Never Used  . Alcohol Use: No  . Drug Use: No  . Sexual Activity: Not on file   Other Topics Concern  . 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 Mother Deceased 30    CHF, Essential Tremor  . Father Deceased     old age, heart disease  . Brother Alive     diabetes  . Brother Alive     COPD, heart disease  . Brother Alive     Benign Essential Tremor  . Sister Alive     healthy  . Daughter Alive     anxiety    Review of Systems A complete 10 system ROS was obtained and was negative apart from what is mentioned.   Objective:   VITALS:   Filed Vitals:   11/13/13 1348  BP: 126/84  Pulse: 60  Resp: 16  Height: 5' 5.5" (1.664 m)  Weight: 153 lb (69.4 kg)   Wt Readings from Last 3 Encounters:  11/13/13 153 lb (69.4 kg)  10/05/13 155 lb 8 oz (70.534 kg)  09/07/13 159 lb (72.122 kg)    Gen:  Appears stated age and in NAD. HEENT:  Normocephalic, atraumatic. The mucous membranes are moist. The superficial temporal arteries are without ropiness or tenderness. Cardiovascular: Regular rate and rhythm. Lungs: Clear to auscultation bilaterally. Neck: There are no carotid bruits noted bilaterally.  NEUROLOGICAL:  Orientation:  The patient is alert and oriented x 3.  Recent and remote memory are intact.  Attention span and concentration are normal.  Able to name objects and repeat without trouble.  Fund of knowledge is appropriate Cranial nerves: There is good facial symmetry.  Speech is fluent and clear. Soft palate rises symmetrically and there is no tongue deviation. Hearing is intact to conversational tone. Tone: Tone is good throughout. Sensation: Sensation is intact to light touch throughout. Coordination:  The patient has no dysdiadichokinesia or dysmetria. Motor: Strength is 5/5 in the bilateral upper and lower extremities.  Shoulder shrug is equal bilaterally.  There is no pronator drift.  There are no fasciculations  noted. Gait and Station: The patient is able to ambulate without difficulty. The patient is able to heel toe walk without any difficulty. The patient is able to ambulate in a tandem fashion. The patient is able to stand in the Romberg position.   MOVEMENT EXAM: Tremor:  There is almost no tremor with posture or with intention today.  Mild trouble with Archimedes spirals.  She does have some difficulty pouring water from one glass to another, especially when the class water is in the right hand.  Labs:  Lab Results  Component Value Date   TSH 1.32 08/30/2013     Chemistry      Component Value Date/Time   NA 140 10/05/2013 1526   K 3.9 10/05/2013 1526   CL 107 10/05/2013 1526   CO2 23 10/05/2013 1526   BUN 12 10/05/2013 1526   CREATININE 0.77 10/05/2013 1526   CREATININE 0.71 04/08/2013 2034      Component Value Date/Time   CALCIUM 9.1 10/05/2013 1526   ALKPHOS 103 10/05/2013 1526   AST 15 10/05/2013 1526   ALT 25 10/05/2013 1526   BILITOT 0.3 10/05/2013 1526        Lab Results  Component Value Date   WBC 8.6 04/08/2013   HGB 11.6* 04/08/2013   HCT 34.6* 04/08/2013   MCV 88.7 04/08/2013   PLT 190 04/08/2013      Assessment/Plan:   1.  Essential Tremor.  -This is moderate now in the R hand.  L hand is fairly mild.  The patient and I discussed various treatment options.  She had a first dose effect with the Mysoline  -She really has done well with the Topamax.  We talked about whether or not we should leave the dose right where it is (my preference) or whether we should increase it.  In the end, we decided to just keep it at 100 mg daily, for now. 2.  Transaminasemia.  -LFT's have normalized 3.  Return in about 4 months (around 03/15/2014).

## 2013-11-23 ENCOUNTER — Telehealth: Payer: Self-pay | Admitting: *Deleted

## 2013-11-23 ENCOUNTER — Encounter: Payer: Self-pay | Admitting: Family Medicine

## 2013-11-23 ENCOUNTER — Ambulatory Visit (INDEPENDENT_AMBULATORY_CARE_PROVIDER_SITE_OTHER): Payer: Medicare HMO | Admitting: Family Medicine

## 2013-11-23 VITALS — BP 130/70 | HR 59 | Temp 98.5°F | Ht 65.5 in | Wt 153.2 lb

## 2013-11-23 DIAGNOSIS — R053 Chronic cough: Secondary | ICD-10-CM

## 2013-11-23 DIAGNOSIS — J4530 Mild persistent asthma, uncomplicated: Secondary | ICD-10-CM

## 2013-11-23 DIAGNOSIS — R0981 Nasal congestion: Secondary | ICD-10-CM

## 2013-11-23 DIAGNOSIS — J45909 Unspecified asthma, uncomplicated: Secondary | ICD-10-CM

## 2013-11-23 DIAGNOSIS — J3489 Other specified disorders of nose and nasal sinuses: Secondary | ICD-10-CM

## 2013-11-23 DIAGNOSIS — J309 Allergic rhinitis, unspecified: Secondary | ICD-10-CM

## 2013-11-23 DIAGNOSIS — R05 Cough: Secondary | ICD-10-CM

## 2013-11-23 DIAGNOSIS — R059 Cough, unspecified: Secondary | ICD-10-CM

## 2013-11-23 MED ORDER — AZELASTINE-FLUTICASONE 137-50 MCG/ACT NA SUSP: NASAL | Status: DC

## 2013-11-23 MED ORDER — AZELASTINE HCL 0.1 % NA SOLN: NASAL | Status: DC

## 2013-11-23 NOTE — Progress Notes (Signed)
Pre visit review using our clinic review tool, if applicable. No additional management support is needed unless otherwise documented below in the visit note. 

## 2013-11-23 NOTE — Telephone Encounter (Signed)
Received fax from [facility] [address] Ria Comment is not covered by insurance.  Requesting change in prescription.

## 2013-11-23 NOTE — Telephone Encounter (Signed)
Snet inrx for astelin.

## 2013-11-23 NOTE — Patient Instructions (Addendum)
Change to new nasal spray 2 sprays per nostril daily. Stop at front desk for referral to allergist.

## 2013-11-23 NOTE — Progress Notes (Signed)
   Subjective:    Patient ID: Michelle Donovan, female    DOB: 19-Oct-1945, 68 y.o.   MRN: [id number]  Cough This is a chronic problem. The current episode started more than 1 month ago. The problem has been gradually worsening (worse in last 2 days, but never got rid of cough since May). The problem occurs constantly. The cough is productive of sputum. Associated symptoms include nasal congestion and postnasal drip. Pertinent negatives include no ear congestion, ear pain, fever, sore throat, shortness of breath or wheezing. Associated symptoms comments: Facial sinus pain. The symptoms are aggravated by lying down. Risk factors: non smoker. She has tried nothing for the symptoms. Her past medical history is significant for asthma and environmental allergies. There is no history of bronchiectasis, bronchitis, COPD, emphysema or pneumonia.   She had had dry cough since May, 2015.  She uses zyrtec, nasocort, Singulair for asthma and allergies. Has had SE to allegra.  2010 stable pulm nodules Saw ENT for chronic cough.. Dx with asthma and allergies and started on singulair but it is not working more. Review of Systems  Constitutional: Negative for fever.  HENT: Positive for postnasal drip. Negative for ear pain and sore throat.   Respiratory: Positive for cough. Negative for shortness of breath and wheezing.   Allergic/Immunologic: Positive for environmental allergies.       Objective:   Physical Exam  Constitutional: Vital signs are normal. She appears well-developed and well-nourished. She is cooperative.  Non-toxic appearance. She does not appear ill. No distress.  HENT:  Head: Normocephalic.  Right Ear: Hearing, tympanic membrane, external ear and ear canal normal. Tympanic membrane is not erythematous, not retracted and not bulging.  Left Ear: Hearing, tympanic membrane, external ear and ear canal normal. Tympanic membrane is not erythematous, not retracted and not bulging.  Nose:  Mucosal edema and rhinorrhea present. Right sinus exhibits no maxillary sinus tenderness and no frontal sinus tenderness. Left sinus exhibits no maxillary sinus tenderness and no frontal sinus tenderness.  Mouth/Throat: Uvula is midline, oropharynx is clear and moist and mucous membranes are normal.  Eyes: Conjunctivae, EOM and lids are normal. Pupils are equal, round, and reactive to light. Lids are everted and swept, no foreign bodies found.  Neck: Trachea normal and normal range of motion. Neck supple. Carotid bruit is not present. No mass and no thyromegaly present.  Cardiovascular: Normal rate, regular rhythm, S1 normal, S2 normal, normal heart sounds, intact distal pulses and normal pulses.  Exam reveals no gallop and no friction rub.   No murmur heard. Pulmonary/Chest: Effort normal and breath sounds normal. Not tachypneic. No respiratory distress. She has no decreased breath sounds. She has no wheezes. She has no rhonchi. She has no rales.  Neurological: She is alert.  Skin: Skin is warm, dry and intact. No rash noted.  Psychiatric: Her speech is normal and behavior is normal. Judgment normal. Her mood appears not anxious. Cognition and memory are normal. She does not exhibit a depressed mood.          Assessment & Plan:  1. ALLERGIC RHINITIS 2. Asthma, mild persistent, uncomplicated 3. Chronic cough 4. Nasal congestion  -  No sign of bacterial infection. - Change nasal steroid to dymista -Continue zyrtec and singulair. Ambulatory referral to Allergy -

## 2014-01-01 ENCOUNTER — Other Ambulatory Visit: Payer: Self-pay | Admitting: Family Medicine

## 2014-02-07 ENCOUNTER — Telehealth: Payer: Self-pay | Admitting: Family Medicine

## 2014-02-07 NOTE — Telephone Encounter (Signed)
Patient Information:  Caller Name: Lalonnie  Phone: [phone]  Patient: Michelle Donovan, Michelle Donovan  Gender: Female  DOB: [birth year]  Age: 68 Years  PCP: Eliezer Lofts (Family Practice)  Office Follow Up:  Does the office need to follow up with this patient?: No  Instructions For The Office: N/A  RN Note:  All emergent sxs ruled out as per Hair Loss Protocol with exception to 'Diffuse hair loss (less than 3 months) and history of thyroid disorder or on thyroid medication.'    Advised pt of need for appt.  Pt agrees.  Symptoms  Reason For Call & Symptoms: Onset x 2 months pt has realized she is having increase hair loss.   02/07/14 pt feels hair loss is worsening, no bald patches but just generalized thinning.   She is concerned because she does have a history of thyroid nodules.  Reviewed Health History In EMR: Yes  Reviewed Medications In EMR: Yes  Reviewed Allergies In EMR: Yes  Reviewed Surgeries / Procedures: Yes  Date of Onset of Symptoms: 12/15/2013  Guideline(s) Used:  No Protocol Available - Sick Adult  Disposition Per Guideline:   See Within 2 Weeks in Office  Reason For Disposition Reached:   Nursing judgment  Advice Given:  N/A  Patient Will Follow Care Advice:  YES  Appointment Scheduled:  02/08/2014 14:30:00 Appointment Scheduled Provider:  Eliezer Lofts [facility])

## 2014-02-08 ENCOUNTER — Ambulatory Visit (INDEPENDENT_AMBULATORY_CARE_PROVIDER_SITE_OTHER): Payer: Medicare HMO | Admitting: Family Medicine

## 2014-02-08 ENCOUNTER — Encounter: Payer: Self-pay | Admitting: Family Medicine

## 2014-02-08 VITALS — BP 110/66 | HR 52 | Temp 97.7°F | Ht 65.5 in | Wt 156.5 lb

## 2014-02-08 DIAGNOSIS — E042 Nontoxic multinodular goiter: Secondary | ICD-10-CM

## 2014-02-08 DIAGNOSIS — L659 Nonscarring hair loss, unspecified: Secondary | ICD-10-CM

## 2014-02-08 LAB — TSH: TSH: 0.862 u[IU]/mL (ref 0.350–4.500)

## 2014-02-08 NOTE — Progress Notes (Signed)
   Subjective:    Patient ID: Michelle Donovan, female    DOB: 08/12/1945, 68 y.o.   MRN: [id number]  HPI  68 year old female with history of multiple thyroid nodules presents with new onset hair loss x 3 months. Hair loss is diffuse, thinner all over. No new hair products, she does color her hair. She had highlights in last early summer.  No rash. No scalp itch.   Lab Results  Component Value Date   TSH 1.32 08/30/2013   No past history of hair issues.  Some androgenic hair loss in family, males.   She did start topamax back at higher dose  Since 08/2013.  She has started work again. She is tired from this. She has been under some stress lately.    Review of Systems  Constitutional: Negative for fever and fatigue.  HENT: Negative for ear pain.   Eyes: Negative for pain.  Respiratory: Negative for chest tightness and shortness of breath.   Cardiovascular: Negative for chest pain, palpitations and leg swelling.  Gastrointestinal: Negative for abdominal pain.  Genitourinary: Negative for dysuria.       Objective:   Physical Exam  Constitutional: Vital signs are normal. She appears well-developed and well-nourished. She is cooperative.  Non-toxic appearance. She does not appear ill. No distress.  HENT:  Head: Normocephalic.  Right Ear: Hearing, tympanic membrane, external ear and ear canal normal. Tympanic membrane is not erythematous, not retracted and not bulging.  Left Ear: Hearing, tympanic membrane, external ear and ear canal normal. Tympanic membrane is not erythematous, not retracted and not bulging.  Nose: No mucosal edema or rhinorrhea. Right sinus exhibits no maxillary sinus tenderness and no frontal sinus tenderness. Left sinus exhibits no maxillary sinus tenderness and no frontal sinus tenderness.  Mouth/Throat: Uvula is midline, oropharynx is clear and moist and mucous membranes are normal.  Eyes: Conjunctivae, EOM and lids are normal. Pupils are equal, round,  and reactive to light. Lids are everted and swept, no foreign bodies found.  Neck: Trachea normal and normal range of motion. Neck supple. Carotid bruit is not present. No mass and no thyromegaly present.  Cardiovascular: Normal rate, regular rhythm, S1 normal, S2 normal, normal heart sounds, intact distal pulses and normal pulses.  Exam reveals no gallop and no friction rub.   No murmur heard. Pulmonary/Chest: Effort normal and breath sounds normal. Not tachypneic. No respiratory distress. She has no decreased breath sounds. She has no wheezes. She has no rhonchi. She has no rales.  Abdominal: Soft. Normal appearance and bowel sounds are normal. There is no tenderness.  Neurological: She is alert.  Skin: Skin is warm, dry and intact. No rash noted.  No rash or scalp changes. Hair is easily removed with pressure, but not in large amounts.  Root intact, no breakage   Psychiatric: Her speech is normal and behavior is normal. Judgment and thought content normal. Her mood appears not anxious. Cognition and memory are normal. She does not exhibit a depressed mood.          Assessment & Plan:

## 2014-02-08 NOTE — Progress Notes (Signed)
Pre visit review using our clinic review tool, if applicable. No additional management support is needed unless otherwise documented below in the visit note. 

## 2014-02-08 NOTE — Patient Instructions (Signed)
Stop at lab on way out. Work on stress reduction. If lab is normal consider discussing topamax as possible cause with neurologist.

## 2014-02-08 NOTE — Assessment & Plan Note (Signed)
?   Secondary to stress over health in last year. Will check thyroid.  ? If secondary to topamax SE, listed as 1-5 % frequency of this SE on PI. Consider derm referral if work up her nml.

## 2014-02-12 ENCOUNTER — Encounter: Payer: Self-pay | Admitting: Neurology

## 2014-02-14 ENCOUNTER — Ambulatory Visit (INDEPENDENT_AMBULATORY_CARE_PROVIDER_SITE_OTHER): Payer: Medicare HMO | Admitting: Neurology

## 2014-02-14 ENCOUNTER — Encounter: Payer: Self-pay | Admitting: Neurology

## 2014-02-14 VITALS — BP 128/60 | HR 64 | Resp 14 | Ht 65.0 in | Wt 157.0 lb

## 2014-02-14 DIAGNOSIS — T887XXA Unspecified adverse effect of drug or medicament, initial encounter: Secondary | ICD-10-CM

## 2014-02-14 DIAGNOSIS — L659 Nonscarring hair loss, unspecified: Secondary | ICD-10-CM

## 2014-02-14 DIAGNOSIS — T50905A Adverse effect of unspecified drugs, medicaments and biological substances, initial encounter: Secondary | ICD-10-CM

## 2014-02-14 DIAGNOSIS — G250 Essential tremor: Secondary | ICD-10-CM

## 2014-02-14 MED ORDER — GABAPENTIN 300 MG PO CAPS: ORAL_CAPSULE | ORAL | Status: DC

## 2014-02-14 NOTE — Progress Notes (Signed)
Subjective:    Michelle Donovan was seen in consultation in the movement disorder clinic at the request of Eliezer Lofts, MD.  The evaluation is for tremor.  Pt has previously seen Dr. Leta Baptist and notes from 2012 were reviewed.  Pt reports that this was just a one time consultation in 2012.   The patient is a 68 y.o. right handed female with a history of tremor.   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  She states that it is in both hands but R is worse than the L (but she uses the right more). There is a family hx of tremor in her mother and brother.  She may use xanax very if going out (less than one time per month) to suppress tremor.    Affected by caffeine:  yes(drinks 8 oz coffee per da) Affected by alcohol:  unknown Affected by stress:  yes Affected by fatigue:  no Spills soup if on spoon:  yes (uses weighted utensils at home for this) Spills glass of liquid if full:  yes Affects ADL's (tying shoes, brushing teeth, etc):  no except trouble with eyeliner  11/13/13 update:  The patient returns today for follow up.  She was restarted on topamax last visit.  She was doing markedly better when it was first started but she has recently had some thyroid issues (multinodular goiter), pink eye, bilateral frozen shoulders and URI and tremor got worse during this.  She has been worked up for elevated liver enzymes but then repeat enzymes were normal and hepatitis panel was normal.  She had some paresthesias with the topamax on initiation.  She has lost some weight; no longer able to drink her coke as it tastes poorly with the topamax.    02/14/14 update:  Pt returns today for f/u.  Noted hair loss and called here and I d/c topamax 2 days ago.   Originally thought that hair loss due to highlights had put in hair over the summer but then realized that hair loss continued.   Pt frustrated as knew that topamax worked for tremor.   Has noted chin tremor.  Restarted back to work.    Current/Previously tried tremor medications: topamax;  Metoprolol - felt drained ; primidone (50 mg - 1/2 at night but got first dose effect and stopped it); meclizine (? Why); on propranolol now - ? Helping now  Current medications that may exacerbate tremor:  n/a  Outside reports reviewed: historical medical records and referral letter/letters.  Allergies  Allergen Reactions  . Codeine     REACTION: vomiting    Current Outpatient Prescriptions on File Prior to Visit  Medication Sig Dispense Refill  . cetirizine (ZYRTEC) 10 MG tablet Take 10 mg by mouth daily.      . montelukast (SINGULAIR) 10 MG tablet TAKE 1 TABLET BY MOUTH AT BEDTIME  30 tablet  5  . propranolol ER (INDERAL LA) 120 MG 24 hr capsule Take 1 capsule (120 mg total) by mouth daily.  30 capsule  11  . triamcinolone (NASACORT ALLERGY 24HR) 55 MCG/ACT AERO nasal inhaler Place 2 sprays into the nose daily.       No current facility-administered medications on file prior to visit.    Past Medical History  Diagnosis Date  . Dermatophytosis of scalp and beard   . Mixed hyperlipidemia   . Lumbago     bulging disk per MRI   .  Irritable bowel syndrome   . Essential and other specified forms of tremor   . Allergic rhinitis, cause unspecified   . Unspecified asthma(493.90)   . HTN (hypertension)   . Other acute reactions to stress   . Other diseases of lung, not elsewhere classified     solitary pulm. nodule(left)  . Unspecified vitamin D deficiency   . Diverticulosis     Past Surgical History  Procedure Laterality Date  . Laparoscopic total hysterectomy  2002  . Tubal ligation  1986  . Tonsillectomy  1967/68  . Laparoscopic unilateral salpingo oopherectomy      History   Social History  . Marital Status: Divorced    Spouse Name: N/A    Number of Children: 1  . Years of Education: N/A   Occupational History  . retired Other    [employer]  .      Social History Main Topics  . Smoking status: Former Smoker    Quit date: 05/17/1980  . Smokeless tobacco: Never Used  . Alcohol Use: No  . Drug Use: No  . Sexual Activity: Not on file   Other Topics Concern  . 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 Mother Deceased 27    CHF, Essential Tremor  . Father Deceased     old age, heart disease  . Brother Alive     diabetes  . Brother Alive     COPD, heart disease  . Brother Alive     Benign Essential Tremor  . Sister Alive     healthy  . Daughter Alive     anxiety    Review of Systems A complete 10 system ROS was obtained and was negative apart from what is mentioned.   Objective:   VITALS:   Filed Vitals:   02/14/14 1331  BP: 128/60  Pulse: 64  Resp: 14  Height: 5\' 5"  (1.651 m)  Weight: 157 lb (71.215 kg)   Wt Readings from Last 3 Encounters:  02/14/14 157 lb (71.215 kg)  02/08/14 156 lb 8 oz (70.988 kg)  11/23/13 153 lb 4 oz (69.514 kg)    Gen:  Appears stated age and in NAD. HEENT:  Normocephalic, atraumatic. The mucous membranes are moist. The superficial temporal arteries are without ropiness or tenderness. Cardiovascular: Regular rate and rhythm. Lungs: Clear to auscultation bilaterally. Neck: There are no carotid bruits noted bilaterally.  NEUROLOGICAL:  Orientation:  The patient is alert and oriented x 3.  Recent and remote memory are intact.  Attention span and concentration are normal.  Able to name objects and repeat without trouble.  Fund of knowledge is appropriate Cranial nerves: There is good facial symmetry.  Speech is fluent and clear. Soft palate rises symmetrically and there is no tongue deviation. Hearing is intact to conversational tone. Tone: Tone is good throughout. Sensation: Sensation is intact  to light touch throughout. Coordination:  The patient has no dysdiadichokinesia or dysmetria. Motor: Strength is 5/5 in the bilateral upper and lower extremities.  Shoulder shrug is equal bilaterally.  There is no pronator drift.  There are no fasciculations noted. Gait and Station: The patient is able to ambulate without difficulty.  The patient is able to heel toe walk without any difficulty. The patient is able to ambulate in a tandem fashion. The patient is able to stand in the Romberg position.   MOVEMENT EXAM: Tremor:  There is mild tremor of the outstretched hands.  Archimedes spirals actually look better than previously and is able to pour water from one glass to another without significant trouble.  Does have chin tremor.  Labs:  Lab Results  Component Value Date   TSH 0.862 02/08/2014     Chemistry      Component Value Date/Time   NA 140 10/05/2013 1526   K 3.9 10/05/2013 1526   CL 107 10/05/2013 1526   CO2 23 10/05/2013 1526   BUN 12 10/05/2013 1526   CREATININE 0.77 10/05/2013 1526   CREATININE 0.71 04/08/2013 2034      Component Value Date/Time   CALCIUM 9.1 10/05/2013 1526   ALKPHOS 103 10/05/2013 1526   AST 15 10/05/2013 1526   ALT 25 10/05/2013 1526   BILITOT 0.3 10/05/2013 1526        Lab Results  Component Value Date   WBC 8.6 04/08/2013   HGB 11.6* 04/08/2013   HCT 34.6* 04/08/2013   MCV 88.7 04/08/2013   PLT 190 04/08/2013      Assessment/Plan:   1.  Essential Tremor.  -Hair loss likely due to Topamax.  We talked about other options and ultimately decided to try gabapentin.  She will slowly work up to gabapentin, 300 mg, one tablet in the morning and 2 tablets at night.  Risks, benefits, side effects and alternative therapies were discussed.  The opportunity to ask questions was given and they were answered to the best of my ability.  The patient expressed understanding and willingness to follow the outlined treatment protocols.  -She asked me about going back  on the Topamax if the gabapentin did not work.  I told her that would be completely up to her.  If she decided that she would like to do that, then we would likely need to do that in combination with several vitamins including zinc, selenium, biotin and a good multivitamin.  I explained to her that I have had patients who continue to have hair loss for a long time with continued use of Topamax, and others to use Topamax and have the hair loss though down.  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  I did tell her that this is unlikely to improve with medication therapy. 2.  Transaminasemia.  -LFT's have normalized 3. I plan to see her back after the new year, and sooner should new neurologic issues arise

## 2014-02-14 NOTE — Patient Instructions (Signed)
1. Start Neurontin 300 mg as follows: Take one tablet at night for one week, then increase to one tablet in the morning and one tablet at night for a week, then increase to one tablet in the morning and two tablets at night.  2. Follow up in January. Let us know about any problems prior.

## 2014-02-15 ENCOUNTER — Encounter: Payer: Self-pay | Admitting: Family Medicine

## 2014-03-13 ENCOUNTER — Encounter: Payer: Self-pay | Admitting: Neurology

## 2014-03-19 ENCOUNTER — Ambulatory Visit: Payer: Medicare HMO | Admitting: Neurology

## 2014-03-20 ENCOUNTER — Encounter: Payer: Self-pay | Admitting: Neurology

## 2014-03-21 MED ORDER — TOPIRAMATE 50 MG PO TABS: 50.0000 mg | ORAL_TABLET | Freq: Every day | ORAL | Status: DC

## 2014-04-22 ENCOUNTER — Encounter: Payer: Self-pay | Admitting: Neurology

## 2014-04-23 ENCOUNTER — Other Ambulatory Visit: Payer: Self-pay | Admitting: Family Medicine

## 2014-05-07 ENCOUNTER — Encounter: Payer: Self-pay | Admitting: Family Medicine

## 2014-05-07 ENCOUNTER — Ambulatory Visit (INDEPENDENT_AMBULATORY_CARE_PROVIDER_SITE_OTHER): Payer: Medicare HMO | Admitting: Family Medicine

## 2014-05-07 VITALS — BP 120/64 | HR 69 | Temp 97.7°F | Ht 65.0 in | Wt 158.5 lb

## 2014-05-07 DIAGNOSIS — J309 Allergic rhinitis, unspecified: Secondary | ICD-10-CM

## 2014-05-07 DIAGNOSIS — H6983 Other specified disorders of Eustachian tube, bilateral: Secondary | ICD-10-CM

## 2014-05-07 DIAGNOSIS — J069 Acute upper respiratory infection, unspecified: Secondary | ICD-10-CM

## 2014-05-07 DIAGNOSIS — H6980 Other specified disorders of Eustachian tube, unspecified ear: Secondary | ICD-10-CM | POA: Insufficient documentation

## 2014-05-07 MED ORDER — AMOXICILLIN 500 MG PO CAPS: 1000.0000 mg | ORAL_CAPSULE | Freq: Two times a day (BID) | ORAL | Status: DC

## 2014-05-07 NOTE — Progress Notes (Signed)
   Subjective:    Patient ID: Michelle Donovan, female    DOB: 10/21/1945, 68 y.o.   MRN: [id number]  HPI   68 year old female patient with history of mild persistent asthma, chronic cough, and allergic rhinitis presents with new onset  In last 6 days of nasal congestion, subjective fever, headache, dizziness, B ears stopped up intermittently  An episode of diarrhea 5 days ago.  occ pain after eating, decreased appetite.  She has been having sinus pressure around eyes. Not blowing out anything abnormal.  No SOB, no wheeze.  Using zyrtec and singulair at night. Not using astelin or nasacort .Marland Kitchen She is out of this.  Review of Systems  Constitutional: Negative for fever and fatigue.  HENT: Negative for ear pain.   Eyes: Negative for pain.  Respiratory: Negative for chest tightness and shortness of breath.   Cardiovascular: Negative for chest pain, palpitations and leg swelling.  Gastrointestinal: Negative for abdominal pain.  Genitourinary: Negative for dysuria.       Objective:   Physical Exam  Constitutional: Vital signs are normal. She appears well-developed and well-nourished. She is cooperative.  Non-toxic appearance. She does not appear ill. No distress.  HENT:  Head: Normocephalic.  Right Ear: Hearing, tympanic membrane, external ear and ear canal normal. Tympanic membrane is not erythematous, not retracted and not bulging.  Left Ear: Hearing, tympanic membrane, external ear and ear canal normal. Tympanic membrane is not erythematous, not retracted and not bulging.  Nose: Mucosal edema and rhinorrhea present. Right sinus exhibits no maxillary sinus tenderness and no frontal sinus tenderness. Left sinus exhibits no maxillary sinus tenderness and no frontal sinus tenderness.  Mouth/Throat: Uvula is midline, oropharynx is clear and moist and mucous membranes are normal.  Eyes: Conjunctivae, EOM and lids are normal. Pupils are equal, round, and reactive to light. Lids are  everted and swept, no foreign bodies found.  Neck: Trachea normal and normal range of motion. Neck supple. Carotid bruit is not present. No thyroid mass and no thyromegaly present.  Cardiovascular: Normal rate, regular rhythm, S1 normal, S2 normal, normal heart sounds, intact distal pulses and normal pulses.  Exam reveals no gallop and no friction rub.   No murmur heard. Pulmonary/Chest: Effort normal and breath sounds normal. No tachypnea. No respiratory distress. She has no decreased breath sounds. She has no wheezes. She has no rhonchi. She has no rales.  Neurological: She is alert.  Skin: Skin is warm, dry and intact. No rash noted.  Psychiatric: Her speech is normal and behavior is normal. Judgment normal. Her mood appears not anxious. Cognition and memory are normal. She does not exhibit a depressed mood.          Assessment & Plan:

## 2014-05-07 NOTE — Assessment & Plan Note (Signed)
Symptomatic care. No clear bacterial sinus infection but given holiday's if not improving with treatment or if new measured fever, purulent discharge or yunilateral sinus pain.

## 2014-05-07 NOTE — Assessment & Plan Note (Signed)
Treat with nasal steroid and antihistmaine spray and oral.

## 2014-05-07 NOTE — Patient Instructions (Signed)
Restart astelin and ansocort.  Can use nasal saline irrigation to flush sinuses.  if not improving in 3-4 days, start and complete antibiotics.

## 2014-05-07 NOTE — Progress Notes (Signed)
Pre visit review using our clinic review tool, if applicable. No additional management support is needed unless otherwise documented below in the visit note. 

## 2014-05-20 ENCOUNTER — Ambulatory Visit (INDEPENDENT_AMBULATORY_CARE_PROVIDER_SITE_OTHER): Payer: PPO | Admitting: Neurology

## 2014-05-20 ENCOUNTER — Encounter: Payer: Self-pay | Admitting: Neurology

## 2014-05-20 VITALS — BP 108/70 | HR 60 | Ht 65.0 in | Wt 159.0 lb

## 2014-05-20 DIAGNOSIS — R251 Tremor, unspecified: Secondary | ICD-10-CM

## 2014-05-20 DIAGNOSIS — G250 Essential tremor: Secondary | ICD-10-CM

## 2014-05-20 NOTE — Progress Notes (Signed)
Subjective:    Michelle Donovan was seen in consultation in the movement disorder clinic at the request of Eliezer Lofts, MD.  The evaluation is for tremor.  Pt has previously seen Dr. Leta Baptist and notes from 2012 were reviewed.  Pt reports that this was just a one time consultation in 2012.   The patient is a 69 y.o. right handed female with a history of tremor.   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  She states that it is in both hands but R is worse than the L (but she uses the right more). There is a family hx of tremor in her mother and brother.  She may use xanax very if going out (less than one time per month) to suppress tremor.    Affected by caffeine:  yes(drinks 8 oz coffee per da) Affected by alcohol:  unknown Affected by stress:  yes Affected by fatigue:  no Spills soup if on spoon:  yes (uses weighted utensils at home for this) Spills glass of liquid if full:  yes Affects ADL's (tying shoes, brushing teeth, etc):  no except trouble with eyeliner  11/13/13 update:  The patient returns today for follow up.  She was restarted on topamax last visit.  She was doing markedly better when it was first started but she has recently had some thyroid issues (multinodular goiter), pink eye, bilateral frozen shoulders and URI and tremor got worse during this.  She has been worked up for elevated liver enzymes but then repeat enzymes were normal and hepatitis panel was normal.  She had some paresthesias with the topamax on initiation.  She has lost some weight; no longer able to drink her coke as it tastes poorly with the topamax.    02/14/14 update:  Pt returns today for f/u.  Noted hair loss and called here and I d/c topamax 2 days ago.   Originally thought that hair loss due to highlights had put in hair over the summer but then realized that hair loss continued.   Pt frustrated as knew that topamax worked for tremor.   Has noted chin tremor.  Restarted back to work.    05/20/14 update:  Pt has a hx of ET.  Tried to d/c the topamax because of hair loss and start neurontin but tremor was not well controlled on neurontin alone and she really wanted wanted to go back on the topamax, knowing the risks.  She states that it isn't really falling out like it was but it isn't coming back like she hoped either.  She states that tremor isn't well controlled either.  She states that she wants to stay on the gabapentin because it is helping the frozen shoulder.  She is feeling depressed because of the tremor.  Asks if her thyroid nodules could be causing the tremor.  Her mother had a goiter and also had tremor.   Current/Previously tried tremor medications: topamax;  Metoprolol - felt drained ; primidone (50 mg - 1/2 at night but got first dose effect and stopped it); meclizine (? Why); on propranolol now - ? Helping now  Current medications that may exacerbate tremor:  n/a  Outside reports reviewed: historical medical records and referral letter/letters.  Allergies  Allergen Reactions  . Codeine     REACTION: vomiting    Current Outpatient Prescriptions on File Prior to Visit  Medication Sig Dispense Refill  . amoxicillin (AMOXIL)  500 MG capsule Take 2 capsules (1,000 mg total) by mouth 2 (two) times daily. Fill if not improving in 3-4 days, VOID after 05/31/2013 40 capsule 0  . azelastine (ASTELIN) 0.1 % nasal spray Place 1 spray into both nostrils 2 (two) times daily. Use in each nostril as directed    . cetirizine (ZYRTEC) 10 MG tablet Take 10 mg by mouth daily.    Marland Kitchen gabapentin (NEURONTIN) 300 MG capsule Take one tablet by mouth in the morning and two tablets by mouth at night 90 capsule 5  . montelukast (SINGULAIR) 10 MG tablet TAKE 1 TABLET BY MOUTH AT BEDTIME 30 tablet 5  . propranolol ER (INDERAL LA) 120 MG 24 hr capsule TAKE 1 CAPSULE BY MOUTH DAILY. 30 capsule 5  . topiramate (TOPAMAX) 100 MG tablet Take 100 mg by  mouth daily.     No current facility-administered medications on file prior to visit.    Past Medical History  Diagnosis Date  . Dermatophytosis of scalp and beard   . Mixed hyperlipidemia   . Lumbago     bulging disk per MRI   . Irritable bowel syndrome   . Essential and other specified forms of tremor   . Allergic rhinitis, cause unspecified   . Unspecified asthma(493.90)   . HTN (hypertension)   . Other acute reactions to stress   . Other diseases of lung, not elsewhere classified     solitary pulm. nodule(left)  . Unspecified vitamin D deficiency   . Diverticulosis     Past Surgical History  Procedure Laterality Date  . Laparoscopic total hysterectomy  2002  . Tubal ligation  1986  . Tonsillectomy  1967/68  . Laparoscopic unilateral salpingo oopherectomy      History   Social History  . Marital Status: Divorced    Spouse Name: N/A    Number of Children: 1  . Years of Education: N/A   Occupational History  . retired Other    [employer]  .     Social History Main Topics  . Smoking status: Former Smoker    Quit date: 05/17/1980  . Smokeless tobacco: Never Used  . Alcohol Use: No  . Drug Use: No  . Sexual Activity: Not on file   Other Topics Concern  . 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 Mother Deceased 42    CHF, Essential Tremor  . Father Deceased     old age, heart disease  . Brother Alive     diabetes  . Brother Alive     COPD, heart disease  . Brother Alive     Benign Essential Tremor  . Sister Alive     healthy  . Daughter Alive     anxiety    Review of Systems A complete 10 system ROS was obtained and was negative apart from what is mentioned.   Objective:   VITALS:   There were no vitals filed for this visit. Wt Readings from Last 3  Encounters:  05/07/14 158 lb 8 oz (71.895 kg)  02/14/14 157 lb (71.215 kg)  02/08/14 156 lb 8 oz (70.988 kg)    Gen:  Appears stated age  and in NAD.  Flat affect today and seems depressed HEENT:  Normocephalic, atraumatic. The mucous membranes are moist. The superficial temporal arteries are without ropiness or tenderness. Cardiovascular: Regular rate and rhythm. Lungs: Clear to auscultation bilaterally. Neck: There are no carotid bruits noted bilaterally.  NEUROLOGICAL:  Orientation:  The patient is alert and oriented x 3.   Cranial nerves: There is good facial symmetry.  Speech is fluent and clear. Soft palate rises symmetrically and there is no tongue deviation. Hearing is intact to conversational tone. Tone: Tone is good throughout. Sensation: Sensation is intact to light touch throughout. Coordination:  The patient has no dysdiadichokinesia or dysmetria. Motor: Strength is 5/5 in the bilateral upper and lower extremities.  Shoulder shrug is equal bilaterally.  There is no pronator drift.  There are no fasciculations noted. Gait and Station: The patient is able to ambulate without difficulty.   MOVEMENT EXAM: Tremor:  There is mild tremor of the outstretched hands.  There is chin tremor.   Archimedes spirals demonstrate minimal tremor.  Labs:  Lab Results  Component Value Date   TSH 0.862 02/08/2014     Chemistry      Component Value Date/Time   NA 140 10/05/2013 1526   K 3.9 10/05/2013 1526   CL 107 10/05/2013 1526   CO2 23 10/05/2013 1526   BUN 12 10/05/2013 1526   CREATININE 0.77 10/05/2013 1526   CREATININE 0.71 04/08/2013 2034      Component Value Date/Time   CALCIUM 9.1 10/05/2013 1526   ALKPHOS 103 10/05/2013 1526   AST 15 10/05/2013 1526   ALT 25 10/05/2013 1526   BILITOT 0.3 10/05/2013 1526        Lab Results  Component Value Date   WBC 8.6 04/08/2013   HGB 11.6* 04/08/2013   HCT 34.6* 04/08/2013   MCV 88.7 04/08/2013   PLT 190 04/08/2013        Assessment/Plan:   1.  Essential Tremor.  -Really see little tremor today except in the chin but pt very frustrated over tremor and depressed about it (no SI/HI).  She is back on the same dose of Topamax that was previously working, but has only been on it for a few weeks.  I gave her several options, which would include increasing the Topamax (knowing that it could increase her loss), changing to another medication (discussed Artane, but also discussed anticholinergic side effects), getting a second movement disorder opinion.  She really was not sure what she wanted.  In the end, we decided to do some lab work as it has been quite some time since she has had some done.  She will have a CBC, CMP, copper, ceruloplasmin and TSH drawn.  We will then contact her and see where she would like to go.  -She does not wish to get off the Neurontin as it is helping her frozen shoulder on the right.  -Told her that I do not think that thyroid nodules are contributed to tremor if they are not causing any abnormalities in thyroid function.  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  I did tell her that this is unlikely to improve with medication therapy. 2.  Transaminasemia.  -LFT's have normalized 3.  Greater than 50% of the 35 minute visit spent in counseling and coordinating care, as above.

## 2014-05-21 ENCOUNTER — Telehealth: Payer: Self-pay | Admitting: Family Medicine

## 2014-05-21 NOTE — Telephone Encounter (Signed)
-----   Message from [place], DO sent at 05/20/2014  4:59 PM EST ----- Jacklynn Bue today.  I didn't see a ton of tremor (did see chin tremor) but she seemed really depressed and frustrated over what she perceived was a lot of tremor.  I am going to do some labs today and gave her several options (changing meds, increasing med, another opinion) and just didn't get a good sense of what she wanted.  Told her that we would contact her after the labs came back but thought that I would let you know as she really seemed depressed.  Thanks  Wells Guiles

## 2014-05-21 NOTE — Telephone Encounter (Signed)
Left message for pt to return my call regarding Dr., Tat's message. If pt calls back please let her know if she is having issues with depression and feels we need to treat this, that she can call to make an appt to discuss  Mood further.

## 2014-05-23 NOTE — Telephone Encounter (Signed)
Ms. Baskins has not returned Dr. Rometta Emery call x 2 days.  Will forward back to Dr. Diona Browner for review.

## 2014-05-24 ENCOUNTER — Encounter: Payer: Self-pay | Admitting: Family Medicine

## 2014-05-24 ENCOUNTER — Telehealth: Payer: Self-pay | Admitting: Neurology

## 2014-05-24 NOTE — Telephone Encounter (Signed)
Let pt know that most of labs looked great.  Liver enzymes just a tad elevated and not sure that clinically significant but I did let her PCP know.  Would hold off on drinking alcohol right now though.  Other labs fine.  Has she thought about options we discussed (can send her for 2nd opinion if she wants)

## 2014-05-24 NOTE — Telephone Encounter (Signed)
Patient made aware labs okay except liver enzymes. She states she doesn't drink alcohol, but aware that we made her PCP aware of results. She wants to remain on Topamax 100 mg for now. She will call if she wants to increase medication dosage or seek second opinion. She will let us know when she needs refills.

## 2014-06-27 ENCOUNTER — Encounter: Payer: Self-pay | Admitting: Neurology

## 2014-06-28 ENCOUNTER — Telehealth: Payer: Self-pay | Admitting: Neurology

## 2014-06-28 NOTE — Telephone Encounter (Signed)
Patient called and states she wants to increase the Topamax instead of adding Artane. Please advise dosage increase for Topamax.

## 2014-06-28 NOTE — Telephone Encounter (Signed)
She should work up so that she adds 25 mg every week until she gets to 100 mg bid (so first week 25mg , 100mg ; second week 50mg /100mg  and so on.)

## 2014-06-30 ENCOUNTER — Other Ambulatory Visit: Payer: Self-pay | Admitting: Family Medicine

## 2014-07-01 MED ORDER — TOPIRAMATE 25 MG PO TABS: ORAL_TABLET | ORAL | Status: DC

## 2014-07-01 MED ORDER — TOPIRAMATE 100 MG PO TABS: 100.0000 mg | ORAL_TABLET | Freq: Two times a day (BID) | ORAL | Status: DC

## 2014-07-01 NOTE — Telephone Encounter (Signed)
Patient made aware of increase instructions and prescriptions sent to her pharmacy.

## 2014-07-09 ENCOUNTER — Ambulatory Visit (INDEPENDENT_AMBULATORY_CARE_PROVIDER_SITE_OTHER): Payer: PPO | Admitting: Family Medicine

## 2014-07-09 ENCOUNTER — Encounter: Payer: Self-pay | Admitting: Family Medicine

## 2014-07-09 VITALS — BP 114/58 | HR 58 | Temp 97.5°F | Ht 65.0 in | Wt 158.5 lb

## 2014-07-09 DIAGNOSIS — K760 Fatty (change of) liver, not elsewhere classified: Secondary | ICD-10-CM

## 2014-07-09 DIAGNOSIS — M25551 Pain in right hip: Secondary | ICD-10-CM | POA: Insufficient documentation

## 2014-07-09 DIAGNOSIS — E782 Mixed hyperlipidemia: Secondary | ICD-10-CM

## 2014-07-09 MED ORDER — DICLOFENAC SODIUM 75 MG PO TBEC: 75.0000 mg | DELAYED_RELEASE_TABLET | Freq: Two times a day (BID) | ORAL | Status: DC

## 2014-07-09 NOTE — Progress Notes (Signed)
Pre visit review using our clinic review tool, if applicable. No additional management support is needed unless otherwise documented below in the visit note. 

## 2014-07-09 NOTE — Assessment & Plan Note (Signed)
LFTs up at last neuro lab visit but stable from previous.  Nml hep panel and [place] in 2014 showed fatty liver.  Counseled pt to work on low fat diet and encouraged chol control.

## 2014-07-09 NOTE — Patient Instructions (Addendum)
Start with diclofenac twice daily for inflammation and arthritis in hip.  If not improving , follow up with Dr. Jefm Bryant for possible hip injection. Work on low fat diet, regular exercise to improve fatty liver.  Hip Exercises RANGE OF MOTION (ROM) AND STRETCHING EXERCISES  These exercises may help you when beginning to rehabilitate your injury. Doing them too aggressively can worsen your condition. Complete them slowly and gently. Your symptoms may resolve with or without further involvement from your physician, physical therapist or athletic trainer. While completing these exercises, remember:   Restoring tissue flexibility helps normal motion to return to the joints. This allows healthier, less painful movement and activity.  An effective stretch should be held for at least 30 seconds.  A stretch should never be painful. You should only feel a gentle lengthening or release in the stretched tissue. If these stretches worsen your symptoms even when done gently, consult your physician, physical therapist or athletic trainer. STRETCH - Hamstrings, Supine   Lie on your back. Loop a belt or towel over the ball of your right / left foot.  Straighten your right / left knee and slowly pull on the belt to raise your leg. Do not allow the right / left knee to bend. Keep your opposite leg flat on the floor.  Raise the leg until you feel a gentle stretch behind your right / left knee or thigh. Hold this position for ________30__ seconds. Repeat _____3_____ times. Complete this stretch ____2______ times per day.  STRETCH - Hip Rotators   Lie on your back on a firm surface. Grasp your right / left knee with your right / left hand and your ankle with your opposite hand.  Keeping your hips and shoulders firmly planted, gently pull your right / left knee and rotate your lower leg toward your opposite shoulder until you feel a stretch in your buttocks.  Hold this stretch for __________ seconds. Repeat  this stretch __________ times. Complete this stretch __________ times per day. STRETCH - Hamstrings/Adductors, V-Sit   Sit on the floor with your legs extended in a large "V," keeping your knees straight.  With your head and chest upright, bend at your waist reaching for your right foot to stretch your left adductors.  You should feel a stretch in your left inner thigh. Hold for __________ seconds.  Return to the upright position to relax your leg muscles.  Continuing to keep your chest upright, bend straight forward at your waist to stretch your hamstrings.  You should feel a stretch behind both of your thighs and/or knees. Hold for __________ seconds.  Return to the upright position to relax your leg muscles.  Repeat steps 2 through 4 for opposite leg. Repeat __________ times. Complete this exercise __________ times per day.  STRETCHING - Hip Flexors, Lunge  Half kneel with your right / left knee on the floor and your opposite knee bent and directly over your ankle.  Keep good posture with your head over your shoulders. Tighten your buttocks to point your tailbone downward; this will prevent your back from arching too much.  You should feel a gentle stretch in the front of your thigh and/or hip. If you do not feel any resistance, slightly slide your opposite foot forward and then slowly lunge forward so your knee once again lines up over your ankle. Be sure your tailbone remains pointed downward.  Hold this stretch for __________ seconds. Repeat __________ times. Complete this stretch __________ times per day. STRENGTHENING EXERCISES  These exercises may help you when beginning to rehabilitate your injury. They may resolve your symptoms with or without further involvement from your physician, physical therapist or athletic trainer. While completing these exercises, remember:   Muscles can gain both the endurance and the strength needed for everyday activities through controlled  exercises.  Complete these exercises as instructed by your physician, physical therapist or athletic trainer. Progress the resistance and repetitions only as guided.  You may experience muscle soreness or fatigue, but the pain or discomfort you are trying to eliminate should never worsen during these exercises. If this pain does worsen, stop and make certain you are following the directions exactly. If the pain is still present after adjustments, discontinue the exercise until you can discuss the trouble with your clinician. STRENGTH - Hip Extensors, Bridge   Lie on your back on a firm surface. Bend your knees and place your feet flat on the floor.  Tighten your buttocks muscles and lift your bottom off the floor until your trunk is level with your thighs. You should feel the muscles in your buttocks and back of your thighs working. If you do not feel these muscles, slide your feet 1-2 inches further away from your buttocks.  Hold this position for __________ seconds.  Slowly lower your hips to the starting position and allow your buttock muscles relax completely before beginning the next repetition.  If this exercise is too easy, you may cross your arms over your chest. Repeat __________ times. Complete this exercise __________ times per day.  STRENGTH - Hip Abductors, Straight Leg Raises  Be aware of your form throughout the entire exercise so that you exercise the correct muscles. Sloppy form means that you are not strengthening the correct muscles.  Lie on your side so that your head, shoulders, knee and hip line up. You may bend your lower knee to help maintain your balance. Your right / left leg should be on top.  Roll your hips slightly forward, so that your hips are stacked directly over each other and your right / left knee is facing forward.  Lift your top leg up 4-6 inches, leading with your heel. Be sure that your foot does not drift forward or that your knee does not roll toward  the ceiling.  Hold this position for __________ seconds. You should feel the muscles in your outer hip lifting (you may not notice this until your leg begins to tire).  Slowly lower your leg to the starting position. Allow the muscles to fully relax before beginning the next repetition. Repeat __________ times. Complete this exercise __________ times per day.  STRENGTH - Hip Adductors, Straight Leg Raises   Lie on your side so that your head, shoulders, knee and hip line up. You may place your upper foot in front to help maintain your balance. Your right / left leg should be on the bottom.  Roll your hips slightly forward, so that your hips are stacked directly over each other and your right / left knee is facing forward.  Tense the muscles in your inner thigh and lift your bottom leg 4-6 inches. Hold this position for __________ seconds.  Slowly lower your leg to the starting position. Allow the muscles to fully relax before beginning the next repetition. Repeat __________ times. Complete this exercise __________ times per day.  STRENGTH - Quadriceps, Straight Leg Raises  Quality counts! Watch for signs that the quadriceps muscle is working to insure you are strengthening the correct muscles  and not "cheating" by substituting with healthier muscles.  Lay on your back with your right / left leg extended and your opposite knee bent.  Tense the muscles in the front of your right / left thigh. You should see either your knee cap slide up or increased dimpling just above the knee. Your thigh may even quiver.  Tighten these muscles even more and raise your leg 4 to 6 inches off the floor. Hold for right / left seconds.  Keeping these muscles tense, lower your leg.  Relax the muscles slowly and completely in between each repetition. Repeat __________ times. Complete this exercise __________ times per day.  STRENGTH - Hip Abductors, Standing  Tie one end of a rubber exercise band/tubing to a  secure surface (table, pole) and tie a loop at the other end.  Place the loop around your right / left ankle. Keeping your ankle with the band directly opposite of the secured end, step away until there is tension in the tube/band.  Hold onto a chair as needed for balance.  Keeping your back upright, your shoulders over your hips, and your toes pointing forward, lift your right / left leg out to your side. Be sure to lift your leg with your hip muscles. Do not "throw" your leg or tip your body to lift your leg.  Slowly and with control, return to the starting position. Repeat exercise __________ times. Complete this exercise __________ times per day.  STRENGTH - Quadriceps, Squats  Stand in a door frame so that your feet and knees are in line with the frame.  Use your hands for balance, not support, on the frame.  Slowly lower your weight, bending at the hips and knees. Keep your lower legs upright so that they are parallel with the door frame. Squat only within the range that does not increase your knee pain. Never let your hips drop below your knees.  Slowly return upright, pushing with your legs, not pulling with your hands. Document Released: 05/21/2005 Document Revised: 07/26/2011 Document Reviewed: 08/15/2008 [facility] Patient Information 2015 [place], [place]. This information is not intended to replace advice given to you by your health care provider. Make sure you discuss any questions you have with your health care provider.

## 2014-07-09 NOTE — Progress Notes (Signed)
   Subjective:    Patient ID: Michelle Donovan, female    DOB: [birth year], 69 y.o.   MRN: [id number]  HPI 69 year old female with history of chronic low back pain presents with new onset pain in right hip x months. Right anterior pain in hip when going from sitting to standing and particular movements. Also has occ burning sensation in right medial thigh at times. Wakes her up at night when she repositions.  No low back pain, no numbness and right leg feels weaker given pain.  She has not used any OTC medication. Gabapentin (for tremor) has helped with her arm pain in past.    No falls, no change in activity. No fever.  08/30/2014 mild arthritis change sin right hip on X-ray at Dr. Nancie Neas.     Review of Systems  Constitutional: Negative for fever and fatigue.  HENT: Negative for ear pain.   Eyes: Negative for pain.  Respiratory: Negative for chest tightness and shortness of breath.   Cardiovascular: Negative for chest pain, palpitations and leg swelling.  Gastrointestinal: Negative for abdominal pain.  Genitourinary: Negative for dysuria.       Objective:   Physical Exam  Constitutional: Vital signs are normal. She appears well-developed and well-nourished. She is cooperative.  Non-toxic appearance. She does not appear ill. No distress.  HENT:  Head: Normocephalic.  Right Ear: Hearing, tympanic membrane, external ear and ear canal normal. Tympanic membrane is not erythematous, not retracted and not bulging.  Left Ear: Hearing, tympanic membrane, external ear and ear canal normal. Tympanic membrane is not erythematous, not retracted and not bulging.  Nose: No mucosal edema or rhinorrhea. Right sinus exhibits no maxillary sinus tenderness and no frontal sinus tenderness. Left sinus exhibits no maxillary sinus tenderness and no frontal sinus tenderness.  Mouth/Throat: Uvula is midline, oropharynx is clear and moist and mucous membranes are normal.  Eyes: Conjunctivae, EOM  and lids are normal. Pupils are equal, round, and reactive to light. Lids are everted and swept, no foreign bodies found.  Neck: Trachea normal and normal range of motion. Neck supple. Carotid bruit is not present. No thyroid mass and no thyromegaly present.  Cardiovascular: Normal rate, regular rhythm, S1 normal, S2 normal, normal heart sounds, intact distal pulses and normal pulses.  Exam reveals no gallop and no friction rub.   No murmur heard. Pulmonary/Chest: Effort normal and breath sounds normal. No tachypnea. No respiratory distress. She has no decreased breath sounds. She has no wheezes. She has no rhonchi. She has no rales.  Abdominal: Soft. Normal appearance and bowel sounds are normal. There is no tenderness.  Musculoskeletal:       Right hip: She exhibits decreased range of motion, tenderness and bony tenderness. She exhibits normal strength.       Lumbar back: She exhibits normal range of motion, no tenderness and no bony tenderness.       Legs:  Pain with int and ext rotation  Neurological: She is alert.  Skin: Skin is warm, dry and intact. No rash noted.  Psychiatric: Her speech is normal and behavior is normal. Judgment and thought content normal. Her mood appears not anxious. Cognition and memory are normal. She does not exhibit a depressed mood.          Assessment & Plan:

## 2014-07-09 NOTE — Assessment & Plan Note (Signed)
Due for re-eval. 

## 2014-07-09 NOTE — Assessment & Plan Note (Signed)
Most likely OA flare. Treat with diclofenac. If not improving return to Dr. Jefm Bryant.

## 2014-07-16 ENCOUNTER — Other Ambulatory Visit: Payer: Self-pay | Admitting: Neurology

## 2014-07-16 NOTE — Telephone Encounter (Signed)
Topamax 25 mg refill requested. Per last office note- patient is to be taking 100 mg tablets twice daily - different prescription was sent in. Refill denied.

## 2014-10-09 ENCOUNTER — Other Ambulatory Visit (INDEPENDENT_AMBULATORY_CARE_PROVIDER_SITE_OTHER): Payer: PPO

## 2014-10-09 ENCOUNTER — Telehealth: Payer: Self-pay | Admitting: Family Medicine

## 2014-10-09 DIAGNOSIS — E538 Deficiency of other specified B group vitamins: Secondary | ICD-10-CM

## 2014-10-09 DIAGNOSIS — E559 Vitamin D deficiency, unspecified: Secondary | ICD-10-CM

## 2014-10-09 DIAGNOSIS — E782 Mixed hyperlipidemia: Secondary | ICD-10-CM

## 2014-10-09 DIAGNOSIS — E042 Nontoxic multinodular goiter: Secondary | ICD-10-CM

## 2014-10-09 LAB — COMPREHENSIVE METABOLIC PANEL
ALT: 26 U/L (ref 0–35)
AST: 20 U/L (ref 0–37)
Albumin: 4 g/dL (ref 3.5–5.2)
Alkaline Phosphatase: 109 U/L (ref 39–117)
BUN: 15 mg/dL (ref 6–23)
CALCIUM: 9.2 mg/dL (ref 8.4–10.5)
CO2: 24 mEq/L (ref 19–32)
Chloride: 108 mEq/L (ref 96–112)
Creatinine, Ser: 0.86 mg/dL (ref 0.40–1.20)
GFR: 69.99 mL/min (ref >60.00)
Glucose, Bld: 104 mg/dL — ABNORMAL HIGH (ref 70–99)
Potassium: 3.8 mEq/L (ref 3.5–5.1)
Sodium: 139 mEq/L (ref 135–145)
TOTAL PROTEIN: 7.3 g/dL (ref 6.0–8.3)
Total Bilirubin: 0.4 mg/dL (ref 0.2–1.2)

## 2014-10-09 LAB — LIPID PANEL
CHOL/HDL RATIO: 8
CHOLESTEROL: 268 mg/dL — AB (ref 0–200)
HDL: 34.5 mg/dL — AB (ref >39.00)
LDL Cholesterol: 197 mg/dL — ABNORMAL HIGH (ref 0–99)
NonHDL: 233.5
TRIGLYCERIDES: 182 mg/dL — AB (ref 0.0–149.0)
VLDL: 36.4 mg/dL (ref 0.0–40.0)

## 2014-10-09 LAB — VITAMIN D 25 HYDROXY (VIT D DEFICIENCY, FRACTURES): VITD: 26.12 ng/mL — AB (ref 30.00–100.00)

## 2014-10-09 LAB — VITAMIN B12: Vitamin B-12: 275 pg/mL (ref 211–911)

## 2014-10-09 NOTE — Telephone Encounter (Signed)
-----   Message from Marchia Bond sent at 10/08/2014  8:19 AM EDT ----- Regarding: Cpx labs on Wed 5/25, need prders please :-) Please order  future cpx labs for pt's upcoming lab appt. Thanks Aniceto Boss

## 2014-10-11 ENCOUNTER — Ambulatory Visit (INDEPENDENT_AMBULATORY_CARE_PROVIDER_SITE_OTHER): Payer: PPO | Admitting: Family Medicine

## 2014-10-11 ENCOUNTER — Encounter: Payer: Self-pay | Admitting: Family Medicine

## 2014-10-11 VITALS — BP 106/60 | HR 59 | Temp 97.6°F | Ht 65.25 in | Wt 149.5 lb

## 2014-10-11 DIAGNOSIS — Z0000 Encounter for general adult medical examination without abnormal findings: Secondary | ICD-10-CM

## 2014-10-11 DIAGNOSIS — R5383 Other fatigue: Secondary | ICD-10-CM | POA: Diagnosis not present

## 2014-10-11 DIAGNOSIS — M8580 Other specified disorders of bone density and structure, unspecified site: Secondary | ICD-10-CM

## 2014-10-11 DIAGNOSIS — G4700 Insomnia, unspecified: Secondary | ICD-10-CM | POA: Diagnosis not present

## 2014-10-11 DIAGNOSIS — E559 Vitamin D deficiency, unspecified: Secondary | ICD-10-CM

## 2014-10-11 DIAGNOSIS — M7502 Adhesive capsulitis of left shoulder: Secondary | ICD-10-CM

## 2014-10-11 DIAGNOSIS — R29898 Other symptoms and signs involving the musculoskeletal system: Secondary | ICD-10-CM

## 2014-10-11 DIAGNOSIS — M79602 Pain in left arm: Secondary | ICD-10-CM

## 2014-10-11 DIAGNOSIS — M79601 Pain in right arm: Secondary | ICD-10-CM

## 2014-10-11 DIAGNOSIS — Z1231 Encounter for screening mammogram for malignant neoplasm of breast: Secondary | ICD-10-CM

## 2014-10-11 MED ORDER — VITAMIN D (ERGOCALCIFEROL) 1.25 MG (50000 UNIT) PO CAPS: 50000.0000 [IU] | ORAL_CAPSULE | ORAL | Status: DC

## 2014-10-11 MED ORDER — GABAPENTIN 100 MG PO CAPS: ORAL_CAPSULE | ORAL | Status: DC

## 2014-10-11 NOTE — Patient Instructions (Addendum)
Start vit D 50, 000 units weekly x 12 week , follow with vit D 400 once daily.  Decrease to 500 mg at bedtime of gabapentin.  Start melatonin 3 mg at bedtime. Review sleep hygeine techniques. Can try B12 during the day to help with energy.  Follow up with Dr. Jefm Bryant for shoulder.  Get back to exercise and healthy eating low chol diet. Stop at front desk to set up bone density and mammogram. Follow up in 1 month 30 min OV multiple issues.

## 2014-10-11 NOTE — Progress Notes (Signed)
Subjective:    Patient ID: Michelle Donovan, female    DOB: 07-27-1945, 69 y.o.   MRN: [id number]  HPI  The patient is here for annual wellness exam and preventative care.  She reports multiple health issues.  1.   She is currently on gabapentin. Initially started for tremor. Initially helped with bilateral arm pain.  Now arm pain is back and very severe, left > right. IN shoulder on left.  Using  600 mg gabapentin to help her sleep at night.  She would like to wen off this medication because she does not like to be dependant on this med. 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 Jefm Bryant. Had injection at that time. Started on PT but could not do given cost. No cervical compression except mildly on right e in MRI in 2015  2. Low vit D:  Needs to supplement.  3.She feels that her muscles are atrophing in arms and legs. She has not changed activity.  No new neck pain, no numbness.  No longer on statin.  4. Decreased energy over all. May be due to gabapentin.   Elevated Cholesterol:  LDL far from goal < 130 on no medicaiton Lab Results  Component Value Date   CHOL 268* 10/09/2014   HDL 34.50* 10/09/2014   LDLCALC 197* 10/09/2014   LDLDIRECT 193.1 12/07/2012   TRIG 182.0* 10/09/2014   CHOLHDL 8 10/09/2014  Using medications without problems: Muscle aches:  Diet compliance: Poor diet. Exercise: none Other complaints:   Hypertension:    On propranolol ER  for essential tremor BP Readings from Last 3 Encounters:  10/11/14 106/60  07/09/14 114/58  05/20/14 108/70  Using medication without problems or lightheadedness:  None Chest pain with exertion:None Edema:None Short of breath:None Average home BPs: Other issues:  She feels her tremor is getting worse despite propranolol ER and topiramate.    Review of Systems  Constitutional: Negative for fever and fatigue.  HENT: Negative for ear pain.   Eyes: Negative for pain.  Respiratory: Negative for chest tightness and  shortness of breath.   Cardiovascular: Negative for chest pain, palpitations and leg swelling.  Gastrointestinal: Negative for abdominal pain.  Genitourinary: Negative for dysuria.       Objective:   Physical Exam  Constitutional: Vital signs are normal. She appears well-developed and well-nourished. She is cooperative.  Non-toxic appearance. She does not appear ill. No distress.  HENT:  Head: Normocephalic.  Right Ear: Hearing, tympanic membrane, external ear and ear canal normal.  Left Ear: Hearing, tympanic membrane, external ear and ear canal normal.  Nose: Nose normal.  Eyes: Conjunctivae, EOM and lids are normal. Pupils are equal, round, and reactive to light. Lids are everted and swept, no foreign bodies found.  Neck: Trachea normal and normal range of motion. Neck supple. Carotid bruit is not present. No thyroid mass and no thyromegaly present.  Cardiovascular: Normal rate, regular rhythm, S1 normal, S2 normal, normal heart sounds and intact distal pulses.  Exam reveals no gallop.   No murmur heard. Pulmonary/Chest: Effort normal and breath sounds normal. No respiratory distress. She has no wheezes. She has no rhonchi. She has no rales.  Abdominal: Soft. Normal appearance and bowel sounds are normal. She exhibits no distension, no fluid wave, no abdominal bruit and no mass. There is no hepatosplenomegaly. There is no tenderness. There is no rebound, no guarding and no CVA tenderness. No hernia.  Genitourinary: No breast swelling, tenderness, discharge or bleeding.  Musculoskeletal:  Right shoulder: She exhibits tenderness.       Left shoulder: She exhibits decreased range of motion, tenderness and bony tenderness.       Cervical back: She exhibits decreased range of motion and tenderness. She exhibits no bony tenderness.  Neg spurling   Dec ROM ..evidence of frozen shoulder on left  Lymphadenopathy:    She has no cervical adenopathy.    She has no axillary adenopathy.    Neurological: She is alert. She has normal strength. No cranial nerve deficit or sensory deficit.  Skin: Skin is warm, dry and intact. No rash noted.  Psychiatric: Her speech is normal and behavior is normal. Judgment normal. Her mood appears not anxious. Cognition and memory are normal. She does not exhibit a depressed mood.          Assessment & Plan:  The patient's preventative maintenance and recommended screening tests for an annual wellness exam were reviewed in full today. Brought up to date unless services declined.  Counselled on the importance of diet, exercise, and its role in overall health and mortality. The patient's FH and SH was reviewed, including their home life, tobacco status, and drug and alcohol status.   No pap/DVE given total Hysterectomy. Vaccines: due for PNA, tdap, shingles, flu.. Refused.  Former smoker, remotely. <25 pack year history Colon: last 2006, tics, repeat in 10 years, will plan next year. Bone density: nml 12/18/10.. due Mammogram: nml 2013.Marland Kitchen due

## 2014-10-11 NOTE — Progress Notes (Signed)
Pre visit review using our clinic review tool, if applicable. No additional management support is needed unless otherwise documented below in the visit note. 

## 2014-10-16 ENCOUNTER — Encounter: Payer: Self-pay | Admitting: Family Medicine

## 2014-10-22 ENCOUNTER — Encounter: Payer: Self-pay | Admitting: Family Medicine

## 2014-10-24 ENCOUNTER — Other Ambulatory Visit: Payer: Self-pay | Admitting: Family Medicine

## 2014-11-05 DIAGNOSIS — R5383 Other fatigue: Secondary | ICD-10-CM | POA: Insufficient documentation

## 2014-11-05 DIAGNOSIS — M7502 Adhesive capsulitis of left shoulder: Secondary | ICD-10-CM | POA: Insufficient documentation

## 2014-11-05 DIAGNOSIS — G4700 Insomnia, unspecified: Secondary | ICD-10-CM | POA: Insufficient documentation

## 2014-11-05 DIAGNOSIS — R29898 Other symptoms and signs involving the musculoskeletal system: Secondary | ICD-10-CM | POA: Insufficient documentation

## 2014-11-05 NOTE — Assessment & Plan Note (Signed)
Start vit D 50, 000 units weekly x 12 week , follow with vit D 400 once daily.

## 2014-11-05 NOTE — Assessment & Plan Note (Signed)
Start melatonin 3 mg at bedtime. Review sleep hygeine techniques.

## 2014-11-05 NOTE — Assessment & Plan Note (Signed)
Follow up with Dr. Jefm Bryant for shoulder.

## 2014-11-05 NOTE — Assessment & Plan Note (Signed)
MAy be due to medicaition SE. Trial of decreasing gabapentin.  Insomnia also a contributor. Can try B12 during the day to help with energy.

## 2014-11-05 NOTE — Assessment & Plan Note (Signed)
Start home PT Likely needs injection and PT referral. Follow up with Dr Jefm Bryant.

## 2014-11-05 NOTE — Assessment & Plan Note (Signed)
No new neck pain, no numbness.  No longer on statin.  Nml exam.

## 2014-11-14 ENCOUNTER — Ambulatory Visit (INDEPENDENT_AMBULATORY_CARE_PROVIDER_SITE_OTHER): Payer: PPO | Admitting: Family Medicine

## 2014-11-14 ENCOUNTER — Encounter: Payer: Self-pay | Admitting: Family Medicine

## 2014-11-14 VITALS — BP 132/68 | HR 57 | Temp 98.1°F | Ht 65.25 in | Wt 147.0 lb

## 2014-11-14 DIAGNOSIS — R5383 Other fatigue: Secondary | ICD-10-CM

## 2014-11-14 DIAGNOSIS — I10 Essential (primary) hypertension: Secondary | ICD-10-CM | POA: Diagnosis not present

## 2014-11-14 DIAGNOSIS — M7502 Adhesive capsulitis of left shoulder: Secondary | ICD-10-CM | POA: Diagnosis not present

## 2014-11-14 MED ORDER — NYSTATIN-TRIAMCINOLONE 100000-0.1 UNIT/GM-% EX CREA: 1.0000 "application " | TOPICAL_CREAM | Freq: Two times a day (BID) | CUTANEOUS | Status: DC

## 2014-11-14 NOTE — Patient Instructions (Addendum)
Continue with current medications for now. Go ahead and start PT.  Keep up great work on exercise. Stop any naps during the day. Schedule cholesterol re-evaluation in 3-6 months.  Work on trying to increase to 3 meals a day.

## 2014-11-14 NOTE — Progress Notes (Signed)
Pre visit review using our clinic review tool, if applicable. No additional management support is needed unless otherwise documented below in the visit note. 

## 2014-11-14 NOTE — Progress Notes (Signed)
Subjective:    Patient ID: Michelle Donovan, female    DOB: 1945-08-01, 69 y.o.   MRN: [id number]  HPI   69 year old female presents for 1 month follow up multiple issues.   Fatigue: Trial of decreasing gabapentin (now on gabapentin 200 mg at night). Minimal improvement with this. Less than 200 mg.. She has issues with insomnia. Started b12 and vit D... Minimal change with this at this time.  Insomnia: Started melatonin 3 mg at bedtime. Has not helped much. Gabapentin 200 mg helps more than this.  She has stopped melatonin now.   Arm and shoulder pain, possible adhesive capsulitis: Referred back to Dr. Jefm Bryant.  She has not seen him yet. She has been doing exercise program at [facility]. Plans to start PT ASAP before seeing Dr. 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  Review of Systems  Constitutional: Negative for fever and fatigue.  HENT: Negative for ear pain.   Eyes: Negative for pain.  Respiratory: Negative for chest tightness and shortness of breath.   Cardiovascular: Negative for chest pain, palpitations and leg swelling.  Gastrointestinal: Negative for abdominal pain.  Genitourinary: Negative for dysuria.       Objective:   Physical Exam  Constitutional: Vital signs are normal. She appears well-developed and well-nourished. She is cooperative.  Non-toxic appearance. She does not appear ill. No distress.  HENT:  Head: Normocephalic.  Right Ear: Hearing, tympanic membrane, external ear and ear canal normal.  Left Ear: Hearing, tympanic membrane, external ear and ear canal normal.  Nose: Nose normal.  Eyes: Conjunctivae, EOM and lids are normal. Pupils are equal, round, and reactive to light. Lids are everted and swept, no foreign bodies found.  Neck: Trachea normal and normal range of motion. Neck  supple. Carotid bruit is not present. No thyroid mass and no thyromegaly present.  Cardiovascular: Normal rate, regular rhythm, S1 normal, S2 normal, normal heart sounds and intact distal pulses.  Exam reveals no gallop.   No murmur heard. Pulmonary/Chest: Effort normal and breath sounds normal. No respiratory distress. She has no wheezes. She has no rhonchi. She has no rales.  Abdominal: Soft. Normal appearance and bowel sounds are normal. She exhibits no distension, no fluid wave, no abdominal bruit and no mass. There is no hepatosplenomegaly. There is no tenderness. There is no rebound, no guarding and no CVA tenderness. No hernia.  Genitourinary: No breast swelling, tenderness, discharge or bleeding.  Musculoskeletal:       Right shoulder: She exhibits tenderness.       Left shoulder: She exhibits decreased range of motion, tenderness and bony tenderness.       Cervical back: She exhibits decreased range of motion and tenderness. She exhibits no bony tenderness.  Neg spurling   Dec ROM ..evidence of frozen shoulder on left  Lymphadenopathy:    She has no cervical adenopathy.    She has no axillary adenopathy.  Neurological: She is alert. She has normal strength. No cranial nerve deficit or sensory deficit.  Skin: Skin is warm, dry and intact. No rash noted.  Psychiatric: Her speech is normal and behavior is normal. Judgment normal. Her mood appears not anxious. Cognition and memory are normal. She does not exhibit a depressed mood.  Assessment & Plan:

## 2014-11-28 ENCOUNTER — Ambulatory Visit: Payer: PPO | Attending: Family Medicine

## 2014-11-28 DIAGNOSIS — M25612 Stiffness of left shoulder, not elsewhere classified: Secondary | ICD-10-CM

## 2014-11-28 DIAGNOSIS — M25512 Pain in left shoulder: Secondary | ICD-10-CM | POA: Diagnosis present

## 2014-11-28 DIAGNOSIS — M7582 Other shoulder lesions, left shoulder: Secondary | ICD-10-CM | POA: Insufficient documentation

## 2014-11-28 NOTE — Therapy (Signed)
[place] MAIN [facility] SERVICES [address] [place], [place], [address] Phone: [phone]   Fax:  [phone]  Physical Therapy Evaluation  Patient Details  Name: Michelle Donovan MRN: [id number] Date of Birth: 05-16-1946 Referring Provider:  Jinny Sanders, MD  Encounter Date: 11/28/2014      PT End of Session - 11/28/14 1825    Visit Number 1   Number of Visits 9   Date for PT Re-Evaluation 11/22/13   PT Start Time 1500   PT Stop Time 1558   PT Time Calculation (min) 58 min   Activity Tolerance Patient limited by pain   Behavior During Therapy Anxious      Past Medical History  Diagnosis Date  . Dermatophytosis of scalp and beard   . Mixed hyperlipidemia   . Lumbago     bulging disk per MRI   . Irritable bowel syndrome   . Essential and other specified forms of tremor   . Allergic rhinitis, cause unspecified   . Unspecified asthma(493.90)   . HTN (hypertension)   . Other acute reactions to stress   . Other diseases of lung, not elsewhere classified     solitary pulm. nodule(left)  . Unspecified vitamin D deficiency   . Diverticulosis     Past Surgical History  Procedure Laterality Date  . Laparoscopic total hysterectomy  2002  . Tubal ligation  1986  . Tonsillectomy  1967/68  . Laparoscopic unilateral salpingo oopherectomy      There were no vitals filed for this visit.  Visit Diagnosis:  Decreased ROM of left shoulder  Pain in joint, shoulder region, left      Subjective Assessment - 11/28/14 1805    Subjective pt reports she has left forzen shoulder which has not yet been diagnosed by her physian but has an appoint scheduled for July 28 for possible cortisone shot.  pt relates she first experienced pain late 2014 and resolved early 2015 and returned in the end of 2015.  pt relates after recieving corizone shot and PT her first epiosde resolved.  pt notes the pain in her right shoulder is more severe than what  she experienced in her first right frozen shoulder.  pt reports intermittent 8/10 pain with no known comfortable position.  she denies numbness and tingling but does not burnging sensation in her anterior shoulder down to elbow.  pt reports she has not had any recent imgaging down but prior imaging did not reveal anything significant that contributed to her right frozen shoulder.  pt reports defficulty most severe pain while pulling pants and drying/styling back of her hair with left shoulder.  pt also notes pain while typing at work due having to flex and extend elbows versus brining keyboard closer.     Currently in Pain? Yes   Pain Score 2    Pain Location Shoulder   Pain Orientation Left            [facility] PT Assessment - 11/28/14 0001    Assessment   Medical Diagnosis left frozen shoulder   Onset Date/Surgical Date --  end of 2015   Hand Dominance Right   Next MD Visit 12/12/14   Prior Therapy none for left shoulder   Precautions   Precautions None   Restrictions   Weight Bearing Restrictions No   Balance Screen   Has the patient fallen in the past 6 months No   Has the patient had a decrease in activity level  because of a fear of falling?  Yes   Is the patient reluctant to leave their home because of a fear of falling?  No   Home Environment   Living Environment Private residence   Living Arrangements Alone   Available Help at Discharge Family   Type of [place] to enter   Entrance Stairs-Number of Steps 1   Entrance Stairs-Rails None   Home Layout One level   [place] None   Prior Function   Level of Independence Independent   Vocation Part time employment   Vocation Requirements sitting and typine   Cognition   Overall Cognitive Status Within Functional Limits for tasks assessed   Sensation   Light Touch Appears Intact   Coordination   Gross Motor Movements are Fluid and Coordinated Yes       Exam: UE Dermatomes: WNL  MMT: Left  elbow flexion 4/5 Modified shoulder flexion/extension: 4/5 Shoulder abduction: deferred due to pain Shoulder flexion: deferred due to pain  L shoulder AROM: Flexion: 80 degrees Abduction:60 degrees External rotation: 10 degrees  Painful at end rage  Left shoulder PROM: Flexion: 91 degrees  Abduction: 70 External rotation: 10 degrees painful at end range and guarded  Glenohumeral AP and inferior glides: hypomobile Not painful  Palpation: pain to upper trap, biceps tendon, teres minor  Therex: Pulleys: flexion 2x10 5-10 s hold, pt required verbal cueing for proper pace Posture education with pt demo                     PT Education - 11/28/14 1823    Education provided Yes   Education Details proper pace for shoulder flexion with pulleys, sit with upright posture versus rounded shoulders    Person(s) Educated Patient   Methods Explanation   Comprehension Verbalized understanding             PT Long Term Goals - 11/28/14 1831    PT LONG TERM GOAL #1   Title pt's active shoulder abduction ROM will improve to at least 120 with pain less than 3/10 in order to wash her hair.    Time 4   Period Weeks   Status New   PT LONG TERM GOAL #2   Title pt will improve DASH score by at least 10.2% to demonstrated improved overal UE function                                                                                     Baseline 28.33%   Time 4   Period Weeks   Status New   PT LONG TERM GOAL #3   Title pt will be able to donn pants with pain less than 3/10    Baseline 8/10   Time 4   Period Weeks   Status New   PT LONG TERM GOAL #4   Title pt's shoulder abduction strength will improve to at least 4/5 in order lift style her hair    Baseline unable to assess due to pain    Period Days   Status New  Plan - 11/28/14 1826    Clinical Impression Statement pt is a 69 year old female with chronic history of left shoulder pain.  pt  presents with decreased AROM and PROM, primairly limited by pain and guards at endrange PROM.  pt also presents with hypomobile glenohumeral joint with AP and inferior glides.  pt  presents rounded shoulders increased kyphosis in sitting which  improved with cueing on to sit upright.  pt would benefit skilled PT services to improve ROM, decresae pain, improve strength and functional mobility.     Pt will benefit from skilled therapeutic intervention in order to improve on the following deficits Decreased range of motion;Impaired UE functional use;Pain;Hypomobility;Impaired flexibility;Decreased strength;Postural dysfunction   Rehab Potential Fair   PT Frequency 2x / week   PT Duration 4 weeks         Problem List Patient Active Problem List   Diagnosis Date Noted  . Fatigue 11/05/2014  . Insomnia 11/05/2014  . Bilateral arm weakness 11/05/2014  . 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 Acute right hip pain 07/09/2014  . Eustachian tube dysfunction 05/07/2014  . Elevated LFTs 10/05/2013  . Fatty liver 10/05/2013  . Multiple thyroid nodules 08/29/2013  . Bilateral arm pain 08/08/2013  . Bilateral neck pain 08/08/2013  . Essential tremor 07/19/2013  . Constipation, chronic 04/17/2013  . Ulnar nerve compression 12/07/2012  . 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 Varicosities of leg 07/07/2011  . B12 deficiency 10/26/2010  . HYPERTENSION 07/17/2009  . ANXIETY, SITUATIONAL 03/06/2009  . Vitamin D deficiency 09/19/2008  . 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 HYPERLIPIDEMIA 02/06/2007  . Allergic rhinitis 02/06/2007  . Asthma, mild persistent 02/06/2007  . IRRITABLE BOWEL SYNDROME 02/06/2007  . LOW BACK PAIN, CHRONIC 02/06/2007   Renford Dills, SPT Renford Dills 11/28/2014, 6:55 PM This entire session was performed under direct supervision and direction of a licensed therapist/therapist assistant . I have personally read, edited and approve of the note as written. Gorden Harms.  Tortorici, PT, DPT [id number]  [place] MAIN [facility] SERVICES [address] [place], [place], [address] Phone: [phone]   Fax:  [phone]

## 2014-12-02 ENCOUNTER — Encounter: Payer: PPO | Admitting: Physical Therapy

## 2014-12-03 ENCOUNTER — Encounter: Payer: Self-pay | Admitting: Internal Medicine

## 2014-12-05 NOTE — Assessment & Plan Note (Signed)
Stay on current dose gabapentin. Stop any naps during the day.  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 Increase exercise and weight loss.

## 2014-12-05 NOTE — Assessment & Plan Note (Signed)
Return to PT, if not improving follow up with Dr. Jefm Bryant.

## 2014-12-05 NOTE — Assessment & Plan Note (Signed)
BP Readings from Last 3 Encounters:  11/14/14 132/68  10/11/14 106/60  07/09/14 114/58   Stable control on current regimen.

## 2014-12-16 ENCOUNTER — Ambulatory Visit: Payer: PPO | Attending: Family Medicine

## 2014-12-16 DIAGNOSIS — M7582 Other shoulder lesions, left shoulder: Secondary | ICD-10-CM | POA: Diagnosis present

## 2014-12-16 DIAGNOSIS — M25512 Pain in left shoulder: Secondary | ICD-10-CM

## 2014-12-16 DIAGNOSIS — M25612 Stiffness of left shoulder, not elsewhere classified: Secondary | ICD-10-CM

## 2014-12-17 NOTE — Therapy (Signed)
[place] MAIN [facility] SERVICES [place], [place], [address] Phone: [phone]   Fax:  [phone]  Physical Therapy Treatment  Patient Details  Name: Michelle Donovan MRN: [id number] Date of Birth: 18-Oct-1945 Referring Provider:  Jinny Sanders, MD  Encounter Date: 12/16/2014      PT End of Session - 12/17/14 0807    Visit Number 2   Number of Visits 9   Date for PT Re-Evaluation 12/24/14   PT Start Time 1600   PT Stop Time 1650   PT Time Calculation (min) 50 min   Activity Tolerance Patient limited by pain  pt was anxious throughout   Behavior During Therapy Anxious      Past Medical History  Diagnosis Date  . Dermatophytosis of scalp and beard   . Mixed hyperlipidemia   . Lumbago     bulging disk per MRI   . Irritable bowel syndrome   . Essential and other specified forms of tremor   . Allergic rhinitis, cause unspecified   . Unspecified asthma(493.90)   . HTN (hypertension)   . Other acute reactions to stress   . Other diseases of lung, not elsewhere classified     solitary pulm. nodule(left)  . Unspecified vitamin D deficiency   . Diverticulosis     Past Surgical History  Procedure Laterality Date  . Laparoscopic total hysterectomy  2002  . Tubal ligation  1986  . Tonsillectomy  1967/68  . Laparoscopic unilateral salpingo oopherectomy      There were no vitals filed for this visit.  Visit Diagnosis:  Decreased ROM of left shoulder  Pain in joint, shoulder region, left      Subjective Assessment - 12/17/14 0802    Subjective pt reports she had a cortisone injection last week. she reports the injection has reduced her pain level a great deal and she thinks she will be able to tolerate therapy better. pt reports she has been using the pullies at home, but hasnt noticed much ROM improvement.    Currently in Pain? Yes   Pain Score 6   (with movement)   Pain Location --  L shoulder      Manual  therapy: GH mobilizations grade 3 to L shoulder: AP, inferior glides, PA glides, and long axis distraction 15s bouts of 4. PROM of L shoulder into flexion, ER, IR, extension, scaption, and abduction, 15-20 repetitions each way with gentle over pressure and oscillations at end range to pts tolerance.   Therex: Shoulder flexion AAROM with wand- 5s hold x 10 (supine or sitting) Shoulder ER AAROM with want 5s hold x 10 (supine or sitting) Shoulder extension with want 5s hold x 10 (supine or sitting) scap retractions 5s x 15  Pt required min verbal/tactile cues.                             PT Education - 12/17/14 0807    Education provided Yes   Education Details HEP compliance, relaxation techniques    Person(s) Educated Patient   Methods Explanation   Comprehension Verbalized understanding;Verbal cues required             PT Long Term Goals - 12/17/14 0815    PT LONG TERM GOAL #1   Title pt's active shoulder abduction ROM will improve to at least 120 with pain less than 3/10 in order to wash her hair.    Time  4   Period Weeks   Status New   PT LONG TERM GOAL #2   Title pt will improve DASH score by at least 10.2% to demonstrated improved overal UE function                                                                                     Baseline 28.33%   Time 4   Period Weeks   Status New   PT LONG TERM GOAL #3   Title pt will be able to donn pants with pain less than 3/10    Baseline 8/10   Time 4   Period Weeks   Status New   PT LONG TERM GOAL #4   Title pt's shoulder abduction strength will improve to at least 4/5 in order lift style her hair    Baseline unable to assess due to pain    Time 8   Period Weeks   Status New               Plan - 12/17/14 0809    Clinical Impression Statement pt presents today with reduced pain following a cortisone injection. Does seem like pt will be able to tolerate manual therapy in order to improve  her shoudler ROM. Pt even demonstated improved ROM at end of session today: shouder flexion to 115, ER to 38deg. progressed HEP for shoulder AAROM exercises focusinging on shoulder ROM    Pt will benefit from skilled therapeutic intervention in order to improve on the following deficits Decreased range of motion;Impaired UE functional use;Pain;Hypomobility;Impaired flexibility;Decreased strength;Postural dysfunction   Rehab Potential Fair   PT Frequency 2x / week   PT Duration 8 weeks   PT Treatment/Interventions ADLs/Self Care Home Management;Aquatic Therapy;Cryotherapy;Electrical Stimulation;Iontophoresis 4mg /ml Dexamethasone;Moist Heat;Therapeutic exercise;Therapeutic activities;Functional mobility training;Ultrasound;Manual techniques;Passive range of motion;Dry needling        Problem List Patient Active Problem List   Diagnosis Date Noted  . Fatigue 11/05/2014  . Insomnia 11/05/2014  . Bilateral arm weakness 11/05/2014  . 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 Acute right hip pain 07/09/2014  . Eustachian tube dysfunction 05/07/2014  . Elevated LFTs 10/05/2013  . Fatty liver 10/05/2013  . Multiple thyroid nodules 08/29/2013  . Bilateral arm pain 08/08/2013  . Bilateral neck pain 08/08/2013  . Essential tremor 07/19/2013  . Constipation, chronic 04/17/2013  . Ulnar nerve compression 12/07/2012  . 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 Varicosities of leg 07/07/2011  . B12 deficiency 10/26/2010  . Benign essential hypertension 07/17/2009  . ANXIETY, SITUATIONAL 03/06/2009  . Vitamin D deficiency 09/19/2008  . 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 HYPERLIPIDEMIA 02/06/2007  . Allergic rhinitis 02/06/2007  . Asthma, mild persistent 02/06/2007  . IRRITABLE BOWEL SYNDROME 02/06/2007  . LOW BACK PAIN, CHRONIC 02/06/2007   Gorden Harms. Francena Zender, PT, DPT [phone]   Nakita Santerre 12/17/2014, 8:17 AM  [place] MAIN [facility] SERVICES [address] [place], [place], [address] Phone: [phone]   Fax:  [phone]

## 2014-12-17 NOTE — Patient Instructions (Signed)
[email or web address] Shoulder flexion AAROM with wand- 5s hold x 10 (supine or sitting) Shoulder ER AAROM with want 5s hold x 10 (supine or sitting) Shoulder extension with want 5s hold x 10 (supine or sitting) scap retractions 5s x 15

## 2014-12-18 ENCOUNTER — Ambulatory Visit: Payer: PPO

## 2014-12-18 DIAGNOSIS — M7582 Other shoulder lesions, left shoulder: Secondary | ICD-10-CM | POA: Diagnosis not present

## 2014-12-18 DIAGNOSIS — M25612 Stiffness of left shoulder, not elsewhere classified: Secondary | ICD-10-CM

## 2014-12-18 DIAGNOSIS — M25512 Pain in left shoulder: Secondary | ICD-10-CM

## 2014-12-19 NOTE — Therapy (Signed)
[place] MAIN [facility] SERVICES [place], [place], [address] Phone: [phone]   Fax:  [phone]  Physical Therapy Treatment  Patient Details  Name: Michelle Donovan MRN: [id number] Date of Birth: 1945/09/17 Referring Provider:  Jinny Sanders, MD  Encounter Date: 12/18/2014      PT End of Session - 12/19/14 1046    Visit Number 3   Number of Visits 9   Date for PT Re-Evaluation 12/24/14   PT Start Time 1430   PT Stop Time 1510   PT Time Calculation (min) 40 min   Activity Tolerance Patient limited by pain  pt was anxious throughout   Behavior During Therapy Anxious      Past Medical History  Diagnosis Date  . Dermatophytosis of scalp and beard   . Mixed hyperlipidemia   . Lumbago     bulging disk per MRI   . Irritable bowel syndrome   . Essential and other specified forms of tremor   . Allergic rhinitis, cause unspecified   . Unspecified asthma(493.90)   . HTN (hypertension)   . Other acute reactions to stress   . Other diseases of lung, not elsewhere classified     solitary pulm. nodule(left)  . Unspecified vitamin D deficiency   . Diverticulosis     Past Surgical History  Procedure Laterality Date  . Laparoscopic total hysterectomy  2002  . Tubal ligation  1986  . Tonsillectomy  1967/68  . Laparoscopic unilateral salpingo oopherectomy      There were no vitals filed for this visit.  Visit Diagnosis:  Decreased ROM of left shoulder  Pain in joint, shoulder region, left      Subjective Assessment - 12/19/14 1036    Subjective pt reports she is doing well this afternoon and has been performing her HEP on a regular basis.  pt relates she is able to raise her left arm enough wash her hair but not far enough to reach the back of her head for styling, drying.     Currently in Pain? No/denies  pt relates she has pain when she begins to move arm   Pain Score 0-No pain        Manual therapy: GH  mobilizations grade 3 to L shoulder: AP, inferior glides 4x30 sec each  PROM of L shoulder into flexion, ER, IR, extension, scaption, and abduction, 15-20 repetitions each way with gentle over pressure and oscillations at end range to pts tolerance Pt required verbal and tactile cues to relax during PROM  Therex: Shoulder ER AAROM with want 2x15 with 1 sec hold  sitting D1 extension within pain free range in standing 2x10 D2 flexion within pain free range in standing 2x10 scap retractions 2x 10 with 2 sec hold Pt required min verbal/tactile cues for proper technique and to decrease left shoulder shrug                                      PT Education - 12/19/14 1042    Education provided Yes   Education Details sitting variation to AAROM shoulder external rotation and fous on relaxing upper trap during HEP   Person(s) Educated Patient   Methods Explanation   Comprehension Verbalized understanding             PT Long Term Goals - 12/17/14 0815    PT LONG TERM GOAL #1  Title pt's active shoulder abduction ROM will improve to at least 120 with pain less than 3/10 in order to wash her hair.    Time 4   Period Weeks   Status New   PT LONG TERM GOAL #2   Title pt will improve DASH score by at least 10.2% to demonstrated improved overal UE function                                                                                     Baseline 28.33%   Time 4   Period Weeks   Status New   PT LONG TERM GOAL #3   Title pt will be able to donn pants with pain less than 3/10    Baseline 8/10   Time 4   Period Weeks   Status New   PT LONG TERM GOAL #4   Title pt's shoulder abduction strength will improve to at least 4/5 in order lift style her hair    Baseline unable to assess due to pain    Time 8   Period Weeks   Status New               Plan - 12/19/14 1047    Clinical Impression Statement pt tolerated manual therapy well today with  minimal increase in pain and progressed to active D2 flexion and D1 extension in standing within pain free range.  pt requires mod verbval and tactile cues to relax during PROM and to decrease left shoulder shrug during exercises.     Pt will benefit from skilled therapeutic intervention in order to improve on the following deficits Decreased range of motion;Impaired UE functional use;Pain;Hypomobility;Impaired flexibility;Decreased strength;Postural dysfunction   Rehab Potential Fair   PT Frequency 2x / week   PT Duration 8 weeks   PT Treatment/Interventions ADLs/Self Care Home Management;Aquatic Therapy;Cryotherapy;Electrical Stimulation;Iontophoresis 4mg /ml Dexamethasone;Moist Heat;Therapeutic exercise;Therapeutic activities;Functional mobility training;Ultrasound;Manual techniques;Passive range of motion;Dry needling        Problem List Patient Active Problem List   Diagnosis Date Noted  . Fatigue 11/05/2014  . Insomnia 11/05/2014  . Bilateral arm weakness 11/05/2014  . 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 Acute right hip pain 07/09/2014  . Eustachian tube dysfunction 05/07/2014  . Elevated LFTs 10/05/2013  . Fatty liver 10/05/2013  . Multiple thyroid nodules 08/29/2013  . Bilateral arm pain 08/08/2013  . Bilateral neck pain 08/08/2013  . Essential tremor 07/19/2013  . Constipation, chronic 04/17/2013  . Ulnar nerve compression 12/07/2012  . 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 Varicosities of leg 07/07/2011  . B12 deficiency 10/26/2010  . Benign essential hypertension 07/17/2009  . ANXIETY, SITUATIONAL 03/06/2009  . Vitamin D deficiency 09/19/2008  . 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 HYPERLIPIDEMIA 02/06/2007  . Allergic rhinitis 02/06/2007  . Asthma, mild persistent 02/06/2007  . IRRITABLE BOWEL SYNDROME 02/06/2007  . LOW BACK PAIN, CHRONIC 02/06/2007   Renford Dills, SPT This entire session was performed under direct supervision and direction of a licensed  therapist/therapist assistant . I have personally read, edited and approve of the note as written. Gorden Harms. Tortorici, PT, DPT [phone]  Tortorici,Ashley 12/19/2014, 1:55 PM  [place] MAIN [facility] SERVICES [place], [place], [address] Phone: [phone]   Fax:  [phone]

## 2014-12-24 ENCOUNTER — Ambulatory Visit: Payer: PPO

## 2014-12-24 DIAGNOSIS — M25512 Pain in left shoulder: Secondary | ICD-10-CM

## 2014-12-24 DIAGNOSIS — M7582 Other shoulder lesions, left shoulder: Secondary | ICD-10-CM | POA: Diagnosis not present

## 2014-12-24 DIAGNOSIS — M25612 Stiffness of left shoulder, not elsewhere classified: Secondary | ICD-10-CM

## 2014-12-25 ENCOUNTER — Ambulatory Visit: Payer: PPO

## 2014-12-25 DIAGNOSIS — M25612 Stiffness of left shoulder, not elsewhere classified: Secondary | ICD-10-CM

## 2014-12-25 DIAGNOSIS — M25512 Pain in left shoulder: Secondary | ICD-10-CM

## 2014-12-25 DIAGNOSIS — M7582 Other shoulder lesions, left shoulder: Secondary | ICD-10-CM | POA: Diagnosis not present

## 2014-12-25 NOTE — Therapy (Signed)
[place] MAIN [facility] SERVICES [place], [place], [address] Phone: [phone]   Fax:  [phone]  Physical Therapy Treatment  Patient Details  Name: Michelle Donovan MRN: [id number] Date of Birth: [birth year] Referring Provider:  Jinny Sanders, MD  Encounter Date: 12/24/2014      PT End of Session - 12/25/14 1230    Visit Number 4   Number of Visits 9   Date for PT Re-Evaluation 12/24/14   PT Start Time 1716   PT Stop Time 1800   PT Time Calculation (min) 44 min   Activity Tolerance Patient limited by pain  pt was anxious throughout   Behavior During Therapy Anxious      Past Medical History  Diagnosis Date  . Dermatophytosis of scalp and beard   . Mixed hyperlipidemia   . Lumbago     bulging disk per MRI   . Irritable bowel syndrome   . Essential and other specified forms of tremor   . Allergic rhinitis, cause unspecified   . Unspecified asthma(493.90)   . HTN (hypertension)   . Other acute reactions to stress   . Other diseases of lung, not elsewhere classified     solitary pulm. nodule(left)  . Unspecified vitamin D deficiency   . Diverticulosis     Past Surgical History  Procedure Laterality Date  . Laparoscopic total hysterectomy  2002  . Tubal ligation  1986  . Tonsillectomy  1967/68  . Laparoscopic unilateral salpingo oopherectomy      There were no vitals filed for this visit.  Visit Diagnosis:  Decreased ROM of left shoulder  Pain in joint, shoulder region, left      Subjective Assessment - 12/25/14 1229    Subjective pt reports she currently has no pain and notes her shoulder motion is improving.    Currently in Pain? No/denies   Pain Score 0-No pain         Manual therapy: GH mobilizations grade 3 to L shoulder: AP, inferior glides 4x30 sec each  PROM of L shoulder into flexion, ER, IR, extension, scaption, and abduction, 15-20 repetitions each way with gentle over pressure and  oscillations at end range to pts tolerance Pt required verbal and tactile cues to relax during PROM  Therex: Ranger (active assisted shoulder ROM): flexion, abduction 2x10 each Cervical retractions x10 Left shoulder wall stretch 2x20 sec scap retractions 2x 10 with 2 sec hold Pt required min verbal/tactile cues for proper technique and to decrease left shoulder shrug                                 PT Long Term Goals - 12/17/14 0815    PT LONG TERM GOAL #1   Title pt's active shoulder abduction ROM will improve to at least 120 with pain less than 3/10 in order to wash her hair.    Time 4   Period Weeks   Status New   PT LONG TERM GOAL #2   Title pt will improve DASH score by at least 10.2% to demonstrated improved overal UE function  Baseline 28.33%   Time 4   Period Weeks   Status New   PT LONG TERM GOAL #3   Title pt will be able to donn pants with pain less than 3/10    Baseline 8/10   Time 4   Period Weeks   Status New   PT LONG TERM GOAL #4   Title pt's shoulder abduction strength will improve to at least 4/5 in order lift style her hair    Baseline unable to assess due to pain    Time 8   Period Weeks   Status New               Plan - 12/25/14 1231    Clinical Impression Statement pt is progressing will with PT and is able to flex her left shoulder 110 degrees actively in sitting and ~120 drees passively in supine.  pt requires mod verbal and tacile cues from decrease upper trap activiation during activities.  pt reponded well using the ranger for active assisted exercises by achiveing improved ROM with less pain.     Pt will benefit from skilled therapeutic intervention in order to improve on the following deficits Decreased range of motion;Impaired UE functional use;Pain;Hypomobility;Impaired flexibility;Decreased strength;Postural dysfunction   Rehab  Potential Fair   PT Frequency 2x / week   PT Duration 8 weeks   PT Treatment/Interventions ADLs/Self Care Home Management;Aquatic Therapy;Cryotherapy;Electrical Stimulation;Iontophoresis 4mg /ml Dexamethasone;Moist Heat;Therapeutic exercise;Therapeutic activities;Functional mobility training;Ultrasound;Manual techniques;Passive range of motion;Dry needling   PT Next Visit Plan outcome measures         Problem List Patient Active Problem List   Diagnosis Date Noted  . Fatigue 11/05/2014  . Insomnia 11/05/2014  . Bilateral arm weakness 11/05/2014  . 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 Acute right hip pain 07/09/2014  . Eustachian tube dysfunction 05/07/2014  . Elevated LFTs 10/05/2013  . Fatty liver 10/05/2013  . Multiple thyroid nodules 08/29/2013  . Bilateral arm pain 08/08/2013  . Bilateral neck pain 08/08/2013  . Essential tremor 07/19/2013  . Constipation, chronic 04/17/2013  . Ulnar nerve compression 12/07/2012  . 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 Varicosities of leg 07/07/2011  . B12 deficiency 10/26/2010  . Benign essential hypertension 07/17/2009  . ANXIETY, SITUATIONAL 03/06/2009  . Vitamin D deficiency 09/19/2008  . 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 HYPERLIPIDEMIA 02/06/2007  . Allergic rhinitis 02/06/2007  . Asthma, mild persistent 02/06/2007  . IRRITABLE BOWEL SYNDROME 02/06/2007  . LOW BACK PAIN, CHRONIC 02/06/2007   Renford Dills, SPT This entire session was performed under direct supervision and direction of a licensed therapist/therapist assistant . I have personally read, edited and approve of the note as written. Gorden Harms. Tortorici, PT, DPT [phone]  Tortorici,Ashley 12/25/2014, 2:45 PM  [place] MAIN [facility] SERVICES [address] [place], [place], [address] Phone: [phone]   Fax:  [phone]

## 2014-12-25 NOTE — Therapy (Signed)
[place] MAIN [facility] SERVICES [place], [place], [address] Phone: [phone]   Fax:  [phone]  Physical Therapy Treatment Physical Therapy Progress Note 11/28/14 to 12/25/14  Patient Details  Name: Michelle Donovan MRN: [id number] Date of Birth: 19-Jan-1946 Referring Provider:  Jinny Sanders, MD  Encounter Date: 12/25/2014      PT End of Session - 12/25/14 1714    Visit Number 5   Number of Visits 9   Date for PT Re-Evaluation 01/22/15   Authorization Type 1/10   PT Start Time 1600   PT Stop Time 1650   PT Time Calculation (min) 50 min   Activity Tolerance Patient limited by pain  pt was anxious throughout   Behavior During Therapy Anxious      Past Medical History  Diagnosis Date  . Dermatophytosis of scalp and beard   . Mixed hyperlipidemia   . Lumbago     bulging disk per MRI   . Irritable bowel syndrome   . Essential and other specified forms of tremor   . Allergic rhinitis, cause unspecified   . Unspecified asthma(493.90)   . HTN (hypertension)   . Other acute reactions to stress   . Other diseases of lung, not elsewhere classified     solitary pulm. nodule(left)  . Unspecified vitamin D deficiency   . Diverticulosis     Past Surgical History  Procedure Laterality Date  . Laparoscopic total hysterectomy  2002  . Tubal ligation  1986  . Tonsillectomy  1967/68  . Laparoscopic unilateral salpingo oopherectomy      There were no vitals filed for this visit.  Visit Diagnosis:  Decreased ROM of left shoulder - Plan: PT plan of care cert/re-cert  Pain in joint, shoulder region, left - Plan: PT plan of care cert/re-cert      Subjective Assessment - 12/25/14 1707    Subjective pt reports she was sweeping her garage and she started having more L shoulder pain. pt reports 7/10 L shoulder pain.    Patient Stated Goals use her L shoulder effectively without pain'   Currently in Pain? Yes   Pain Score 7     Pain Location --  L shoulder       manual therapy: Prone thoracic mobilizations grade II-III CPA and rotational mobs T1-7 30bouts of oscillations each Soft tissue massage to L biceps, deltoid and upper trap: trigger point palpable and painful to L middle deltoid, L upper trap AP GH joint mobs grade III 3x10 oscillations  Therex:  Supine shoulder fleixon with PT assist to correct scapular rhythm x 10 Side lying scapular PNF D1 rhythmic initiation x 5 then AROM x 10, then resisted x 10 Side lying shoulder abduction and flexion with min-max cues for scapular position. X 10 each Standing shoulder flexion with mod cues for scapular position x 10  Pt reports 4/10 pain following treatment.                            PT Education - 12/25/14 1713    Education provided Yes   Education Details education regarding [place] joint and scapular/thoracic joint and shoulder elevation. also repeated TS extension over boulster    Person(s) Educated Patient   Methods Explanation   Comprehension Verbalized understanding             PT Long Term Goals - 12/25/14 1718    PT LONG TERM GOAL #  1   Title pt's active shoulder abduction ROM will improve to at least 120 with pain less than 3/10 in order to wash her hair.    Time 4   Period Weeks   Status Partially Met   PT LONG TERM GOAL #2   Title pt will improve DASH score by at least 10.2% to demonstrated improved overal UE function                                                                                     Baseline 29% Jan 02, 2015   Time 4   Period Weeks   Status On-going   PT LONG TERM GOAL #3   Title pt will be able to donn pants with pain less than 3/10    Baseline 8/10   Time 4   Period Weeks   Status Partially Met   PT LONG TERM GOAL #4   Title pt's shoulder abduction strength will improve to at least 4/5 in order lift style her hair    Baseline unable to assess due to pain    Time 8   Period Weeks   Status  Partially Met               Plan - 01-02-2015 1714    Clinical Impression Statement pt is progressing very well towards goals. She did have more pain today after doing some sweeping, however pt has been unable to do activities like this so she is showing improved function. pt ROM is much better. pt responded well to thoracic and rib mobilizatons regarding shoulder ROM and pain relief. pt also responded well to STM to the shoulder and bicep musculature with reduced pain to 4/10 following treatment. extensive education today regarding scapular-humeral rhythm. pt had improved demonstration of shoulder abduction and flexion with strong emphasis on scapular positioning. also did well with this in gravity minimized positioning. pt would benefit from continued skilled PT services to progress towards goals as tolerated to maximize function and reduce pain.    Pt will benefit from skilled therapeutic intervention in order to improve on the following deficits Decreased range of motion;Impaired UE functional use;Pain;Hypomobility;Impaired flexibility;Decreased strength;Postural dysfunction   Rehab Potential Fair   PT Frequency 2x / week   PT Duration 8 weeks   PT Treatment/Interventions ADLs/Self Care Home Management;Aquatic Therapy;Cryotherapy;Electrical Stimulation;Iontophoresis 50m/ml Dexamethasone;Moist Heat;Therapeutic exercise;Therapeutic activities;Functional mobility training;Ultrasound;Manual techniques;Passive range of motion;Dry needling          G-Codes - [id number]    Functional Assessment Tool Used quickdash, ROM, NPI   Functional Limitation Carrying, moving and handling objects   Changing and Maintaining Body Position Current Status ((O3729 At least 20 percent but less than 40 percent impaired, limited or restricted   Changing and Maintaining Body Position Goal Status ((M2111 At least 1 percent but less than 20 percent impaired, limited or restricted      Problem List Patient  Active Problem List   Diagnosis Date Noted  . Fatigue 11/05/2014  . Insomnia 11/05/2014  . Bilateral arm weakness 11/05/2014  . 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 Acute right hip pain 07/09/2014  . Eustachian tube dysfunction 05/07/2014  .  Elevated LFTs 10/05/2013  . Fatty liver 10/05/2013  . Multiple thyroid nodules 08/29/2013  . Bilateral arm pain 08/08/2013  . Bilateral neck pain 08/08/2013  . Essential tremor 07/19/2013  . Constipation, chronic 04/17/2013  . Ulnar nerve compression 12/07/2012  . 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 Varicosities of leg 07/07/2011  . B12 deficiency 10/26/2010  . Benign essential hypertension 07/17/2009  . ANXIETY, SITUATIONAL 03/06/2009  . Vitamin D deficiency 09/19/2008  . 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 HYPERLIPIDEMIA 02/06/2007  . Allergic rhinitis 02/06/2007  . Asthma, mild persistent 02/06/2007  . IRRITABLE BOWEL SYNDROME 02/06/2007  . LOW BACK PAIN, CHRONIC 02/06/2007   Gorden Harms. Irelynd Zumstein, PT, DPT [phone]   Riel Hirschman 12/25/2014, 5:22 PM  [place] MAIN [facility] SERVICES [address] [place], [place], [address] Phone: [phone]   Fax:  [phone]

## 2015-01-01 DIAGNOSIS — M25612 Stiffness of left shoulder, not elsewhere classified: Secondary | ICD-10-CM

## 2015-01-01 DIAGNOSIS — M25512 Pain in left shoulder: Secondary | ICD-10-CM

## 2015-01-01 NOTE — Therapy (Signed)
[place] MAIN [facility] SERVICES [address] [place], [place], [address] Phone: [phone]   Fax:  [phone]  January 01, 2015   _0 @  Physical Therapy Discharge Summary  Patient: Michelle Donovan  MRN: [id number]  Date of Birth: 02-Feb-1946   Diagnosis: Decreased ROM of left shoulder  Pain in joint, shoulder region, left Referring Provider:  Jinny Sanders, MD  Pt requested to be DC from therapy due to family issues limiting her time and resources. Unable to get objective measures.       PT Long Term Goals - 12/25/14 1718    PT LONG TERM GOAL #1   Title pt's active shoulder abduction ROM will improve to at least 120 with pain less than 3/10 in order to wash her hair.    Time 4   Period Weeks   Status Partially Met   PT LONG TERM GOAL #2   Title pt will improve DASH score by at least 10.2% to demonstrated improved overal UE function                                                                                     Baseline 29% 12/25/14   Time 4   Period Weeks   Status On-going   PT LONG TERM GOAL #3   Title pt will be able to donn pants with pain less than 3/10    Baseline 8/10   Time 4   Period Weeks   Status Partially Met   PT LONG TERM GOAL #4   Title pt's shoulder abduction strength will improve to at least 4/5 in order lift style her hair    Baseline unable to assess due to pain    Time 8   Period Weeks   Status Partially Met     Thank you for the referral.     Sincerely,  Caryl Pina C. Dorthy Hustead, PT, DPT #[id number]   Alease Medina, PT   CC _1 @  [place] [address] [place], [place], [address] Phone: [phone]   Fax:  [phone]

## 2015-02-02 ENCOUNTER — Encounter: Payer: Self-pay | Admitting: Neurology

## 2015-02-18 ENCOUNTER — Ambulatory Visit (INDEPENDENT_AMBULATORY_CARE_PROVIDER_SITE_OTHER): Payer: PPO | Admitting: Neurology

## 2015-02-18 ENCOUNTER — Encounter: Payer: Self-pay | Admitting: Neurology

## 2015-02-18 DIAGNOSIS — G250 Essential tremor: Secondary | ICD-10-CM | POA: Diagnosis not present

## 2015-02-18 DIAGNOSIS — L659 Nonscarring hair loss, unspecified: Secondary | ICD-10-CM

## 2015-02-18 MED ORDER — CLONAZEPAM 0.5 MG PO TABS: 0.2500 mg | ORAL_TABLET | Freq: Two times a day (BID) | ORAL | Status: DC

## 2015-02-18 NOTE — Patient Instructions (Addendum)
1.  Take klonopin - 0.5 mg - 1/2 tablet at night for 2 weeks and then if you are tolerating it, start 1/4 to 1/2 tablet in the AM as well as 1/2 tablet at night 2. Decrease topamax to 100 mg once daily for one week, then decrease to 50 mg once daily for one week, then stop.

## 2015-02-18 NOTE — Progress Notes (Signed)
Subjective:    Michelle Donovan was seen in consultation in the movement disorder clinic at the request of Eliezer Lofts, MD.  The evaluation is for tremor.  Pt has previously seen Dr. Leta Baptist and notes from 2012 were reviewed.  Pt reports that this was just a one time consultation in 2012.   The patient is a 69 y.o. right handed female with a history of tremor.   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  She states that it is in both hands but R is worse than the L (but she uses the right more). There is a family hx of tremor in her mother and brother.  She may use xanax very if going out (less than one time per month) to suppress tremor.    Affected by caffeine:  yes(drinks 8 oz coffee per da) Affected by alcohol:  unknown Affected by stress:  yes Affected by fatigue:  no Spills soup if on spoon:  yes (uses weighted utensils at home for this) Spills glass of liquid if full:  yes Affects ADL's (tying shoes, brushing teeth, etc):  no except trouble with eyeliner  11/13/13 update:  The patient returns today for follow up.  She was restarted on topamax last visit.  She was doing markedly better when it was first started but she has recently had some thyroid issues (multinodular goiter), pink eye, bilateral frozen shoulders and URI and tremor got worse during this.  She has been worked up for elevated liver enzymes but then repeat enzymes were normal and hepatitis panel was normal.  She had some paresthesias with the topamax on initiation.  She has lost some weight; no longer able to drink her coke as it tastes poorly with the topamax.    02/14/14 update:  Pt returns today for f/u.  Noted hair loss and called here and I d/c topamax 2 days ago.   Originally thought that hair loss due to highlights had put in hair over the summer but then realized that hair loss continued.   Pt frustrated as knew that topamax worked for tremor.   Has noted chin tremor.  Restarted back to work.    05/20/14 update:  Pt has a hx of ET.  Tried to d/c the topamax because of hair loss and start neurontin but tremor was not well controlled on neurontin alone and she really wanted wanted to go back on the topamax, knowing the risks.  She states that it isn't really falling out like it was but it isn't coming back like she hoped either.  She states that tremor isn't well controlled either.  She states that she wants to stay on the gabapentin because it is helping the frozen shoulder.  She is feeling depressed because of the tremor.  Asks if her thyroid nodules could be causing the tremor.  Her mother had a goiter and also had tremor.   02/18/15 update:  The patient presents today for follow-up.  I have not seen her since January, 2016.  She ended up wanting to increase the dose of Topamax, which she did to a total of 200 mg per day.  She sent me an email recently stating that tremor was less well controlled and she was having more hair loss.  She wanted to consider other options.  She admits that stress has increased greatly; her daughter and grandchild have moved back into her home.  She reports  that her daughter makes things very stressful for her.   She is noting tremor with eating, drinking, putting on makeup, writing.   She was on gabapentin for her shoulder pain but she has d/c it now.  She is on propranolol LA 120 mg daily for blood pressure.  We have talked about Artane in the past, but she has not wanted to try that.  She has tried only 1 pill of primidone in the past and it sounds like she had first dose effect and has not wanted to retry that.    Current/Previously tried tremor medications: topamax;  Metoprolol - felt drained ; primidone (50 mg - 1/2 at night but got first dose effect and stopped it); meclizine (? Why); on propranolol now - ? Helping now  Current medications that may exacerbate tremor:  n/a  Outside reports reviewed: historical  medical records and referral letter/letters.  Allergies  Allergen Reactions  . Codeine     REACTION: vomiting    Current Outpatient Prescriptions on File Prior to Visit  Medication Sig Dispense Refill  . cetirizine (ZYRTEC) 10 MG tablet Take 10 mg by mouth daily.    Marland Kitchen ipratropium (ATROVENT) 0.03 % nasal spray   3  . montelukast (SINGULAIR) 10 MG tablet TAKE 1 TABLET BY MOUTH AT BEDTIME 30 tablet 11  . propranolol ER (INDERAL LA) 120 MG 24 hr capsule TAKE 1 CAPSULE BY MOUTH DAILY. 30 capsule 5  . topiramate (TOPAMAX) 100 MG tablet Take 1 tablet (100 mg total) by mouth 2 (two) times daily. 60 tablet 5   No current facility-administered medications on file prior to visit.    Past Medical History  Diagnosis Date  . Dermatophytosis of scalp and beard   . Mixed hyperlipidemia   . Lumbago     bulging disk per MRI   . Irritable bowel syndrome   . Essential and other specified forms of tremor   . Allergic rhinitis, cause unspecified   . Unspecified asthma(493.90)   . HTN (hypertension)   . Other acute reactions to stress   . Other diseases of lung, not elsewhere classified     solitary pulm. nodule(left)  . Unspecified vitamin D deficiency   . Diverticulosis     Past Surgical History  Procedure Laterality Date  . Laparoscopic total hysterectomy  2002  . Tubal ligation  1986  . Tonsillectomy  1967/68  . Laparoscopic unilateral salpingo oopherectomy      Social History   Social History  . Marital Status: Divorced    Spouse Name: N/A  . Number of Children: 1  . Years of Education: N/A   Occupational History  . retired Other    [employer]  .     Social History Main Topics  . Smoking status: Former Smoker    Quit date: 05/17/1980  . Smokeless tobacco: Never Used  . Alcohol Use: No  . Drug Use: No  . Sexual Activity: Not on file   Other Topics Concern  . 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 Mother Deceased 52    CHF, Essential Tremor  . Father Deceased     old age, heart disease  .  Brother Alive     diabetes  . Brother Alive     COPD, heart disease  . Brother Alive     Benign Essential Tremor  . Sister Alive     healthy  . Daughter Alive     anxiety    Review of Systems A complete 10 system ROS was obtained and was negative apart from what is mentioned.   Objective:   VITALS:   Filed Vitals:   02/18/15 1351  BP: 118/60  Pulse: 63  Height: 5\' 5"  (1.651 m)  Weight: 141 lb (63.957 kg)   Wt Readings from Last 3 Encounters:  02/18/15 141 lb (63.957 kg)  11/14/14 147 lb (66.679 kg)  10/11/14 149 lb 8 oz (67.813 kg)    Gen:  Appears stated age and in NAD.  Flat affect today and seems depressed HEENT:  Normocephalic, atraumatic. The mucous membranes are moist. The superficial temporal arteries are without ropiness or tenderness. Cardiovascular: Regular rate and rhythm. Lungs: Clear to auscultation bilaterally. Neck: There are no carotid bruits noted bilaterally.  NEUROLOGICAL:  Orientation:  The patient is alert and oriented x 3.   Cranial nerves: There is good facial symmetry.  Speech is fluent and clear. Soft palate rises symmetrically and there is no tongue deviation. Hearing is intact to conversational tone. Tone: Tone is good throughout. Sensation: Sensation is intact to light touch throughout. Coordination:  The patient has no dysdiadichokinesia or dysmetria. Motor: Strength is 5/5 in the bilateral upper and lower extremities.  Shoulder shrug is equal bilaterally.  There is no pronator drift.  There are no fasciculations noted. Gait and Station: The patient is able to ambulate without difficulty.   MOVEMENT EXAM: Tremor:  There is mild tremor of the outstretched hands.  There is chin tremor.   Archimedes spirals  demonstrate significantly worsened tremor compared to previous.  Labs:  Lab Results  Component Value Date   TSH 0.862 02/08/2014     Chemistry      Component Value Date/Time   NA 139 10/09/2014 0927   K 3.8 10/09/2014 0927   CL 108 10/09/2014 0927   CO2 24 10/09/2014 0927   BUN 15 10/09/2014 0927   CREATININE 0.86 10/09/2014 0927   CREATININE 0.77 10/05/2013 1526      Component Value Date/Time   CALCIUM 9.2 10/09/2014 0927   ALKPHOS 109 10/09/2014 0927   AST 20 10/09/2014 0927   ALT 26 10/09/2014 0927   BILITOT 0.4 10/09/2014 0927        Lab Results  Component Value Date   WBC 8.6 04/08/2013   HGB 11.6* 04/08/2013   HCT 34.6* 04/08/2013   MCV 88.7 04/08/2013   PLT 190 04/08/2013   Lab Results  Component Value Date   TSH 0.862 02/08/2014      Assessment/Plan:   1.  Essential Tremor.  --I had a very long discussion with the patient today.  I really would like her to retry primidone, as she only tried one pill in the past.  I explained to her that first dose effect only occurs with the first pill.  She is very nervous about reattempting it and wants to try something else.  Explained that only primidone and propranolol are first line drugs and she is already on propranolol, which I cannot increase because of low pulse.  I talked to her about Artane, but I am worried about its anticholinergic side effects in her age group.  She doesn't want to try that one  because of the possibility of dry mouth.  Ultimately, we decided to cautiously try low-dose clonazepam, 0.5 mg.  She will take a half a tablet at night for 2 weeks and then will try just a quarter of a tablet in the morning.  This may help some with her anxiety as well, but I told her if she experiences daytime sleepiness she is to let me know.  Risks, benefits, side effects and alternative therapies were discussed.  The opportunity to ask questions was given and they were answered to the best of my ability.  The patient  expressed understanding and willingness to follow the outlined treatment protocols.  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  I did tell her that this is unlikely to improve with medication therapy.  -wean off topamax 2.  Transaminasemia.  -LFT's have normalized 3.  Greater than 50% of the 35 minute visit spent in counseling and coordinating care, as above.

## 2015-03-31 ENCOUNTER — Other Ambulatory Visit: Payer: Self-pay | Admitting: *Deleted

## 2015-03-31 MED ORDER — PROPRANOLOL HCL ER 120 MG PO CP24: 120.0000 mg | ORAL_CAPSULE | Freq: Every day | ORAL | Status: DC

## 2015-03-31 MED ORDER — MONTELUKAST SODIUM 10 MG PO TABS: 10.0000 mg | ORAL_TABLET | Freq: Every day | ORAL | Status: DC

## 2015-04-08 ENCOUNTER — Encounter: Payer: Self-pay | Admitting: Internal Medicine

## 2015-04-08 ENCOUNTER — Ambulatory Visit (INDEPENDENT_AMBULATORY_CARE_PROVIDER_SITE_OTHER): Payer: PPO | Admitting: Internal Medicine

## 2015-04-08 VITALS — BP 140/72 | HR 78 | Temp 99.4°F | Wt 141.0 lb

## 2015-04-08 DIAGNOSIS — J069 Acute upper respiratory infection, unspecified: Secondary | ICD-10-CM | POA: Diagnosis not present

## 2015-04-08 DIAGNOSIS — B9789 Other viral agents as the cause of diseases classified elsewhere: Principal | ICD-10-CM

## 2015-04-08 NOTE — Patient Instructions (Signed)
Upper Respiratory Infection, Adult Most upper respiratory infections (URIs) are a viral infection of the air passages leading to the lungs. A URI affects the nose, throat, and upper air passages. The most common type of URI is nasopharyngitis and is typically referred to as "the common cold." URIs run their course and usually go away on their own. Most of the time, a URI does not require medical attention, but sometimes a bacterial infection in the upper airways can follow a viral infection. This is called a secondary infection. Sinus and middle ear infections are common types of secondary upper respiratory infections. Bacterial pneumonia can also complicate a URI. A URI can worsen asthma and chronic obstructive pulmonary disease (COPD). Sometimes, these complications can require emergency medical care and may be life threatening.  CAUSES Almost all URIs are caused by viruses. A virus is a type of germ and can spread from one person to another.  RISKS FACTORS You may be at risk for a URI if:   You smoke.   You have chronic heart or lung disease.  You have a weakened defense (immune) system.   You are very young or very old.   You have nasal allergies or asthma.  You work in crowded or poorly ventilated areas.  You work in health care facilities or schools. SIGNS AND SYMPTOMS  Symptoms typically develop 2-3 days after you come in contact with a cold virus. Most viral URIs last 7-10 days. However, viral URIs from the influenza virus (flu virus) can last 14-18 days and are typically more severe. Symptoms may include:   Runny or stuffy (congested) nose.   Sneezing.   Cough.   Sore throat.   Headache.   Fatigue.   Fever.   Loss of appetite.   Pain in your forehead, behind your eyes, and over your cheekbones (sinus pain).  Muscle aches.  DIAGNOSIS  Your health care provider may diagnose a URI by:  Physical exam.  Tests to check that your symptoms are not due to  another condition such as:  Strep throat.  Sinusitis.  Pneumonia.  Asthma. TREATMENT  A URI goes away on its own with time. It cannot be cured with medicines, but medicines may be prescribed or recommended to relieve symptoms. Medicines may help:  Reduce your fever.  Reduce your cough.  Relieve nasal congestion. HOME CARE INSTRUCTIONS   Take medicines only as directed by your health care provider.   Gargle warm saltwater or take cough drops to comfort your throat as directed by your health care provider.  Use a warm mist humidifier or inhale steam from a shower to increase air moisture. This may make it easier to breathe.  Drink enough fluid to keep your urine clear or pale yellow.   Eat soups and other clear broths and maintain good nutrition.   Rest as needed.   Return to work when your temperature has returned to normal or as your health care provider advises. You may need to stay home longer to avoid infecting others. You can also use a face mask and careful hand washing to prevent spread of the virus.  Increase the usage of your inhaler if you have asthma.   Do not use any tobacco products, including cigarettes, chewing tobacco, or electronic cigarettes. If you need help quitting, ask your health care provider. PREVENTION  The best way to protect yourself from getting a cold is to practice good hygiene.   Avoid oral or hand contact with people with cold   symptoms.   Wash your hands often if contact occurs.  There is no clear evidence that vitamin C, vitamin E, echinacea, or exercise reduces the chance of developing a cold. However, it is always recommended to get plenty of rest, exercise, and practice good nutrition.  SEEK MEDICAL CARE IF:   You are getting worse rather than better.   Your symptoms are not controlled by medicine.   You have chills.  You have worsening shortness of breath.  You have brown or red mucus.  You have yellow or brown nasal  discharge.  You have pain in your face, especially when you bend forward.  You have a fever.  You have swollen neck glands.  You have pain while swallowing.  You have white areas in the back of your throat. SEEK IMMEDIATE MEDICAL CARE IF:   You have severe or persistent:  Headache.  Ear pain.  Sinus pain.  Chest pain.  You have chronic lung disease and any of the following:  Wheezing.  Prolonged cough.  Coughing up blood.  A change in your usual mucus.  You have a stiff neck.  You have changes in your:  Vision.  Hearing.  Thinking.  Mood. MAKE SURE YOU:   Understand these instructions.  Will watch your condition.  Will get help right away if you are not doing well or get worse.   This information is not intended to replace advice given to you by your health care provider. Make sure you discuss any questions you have with your health care provider.   Document Released: 10/27/2000 Document Revised: 09/17/2014 Document Reviewed: 08/08/2013 Elsevier Interactive Patient Education 2016 [employer]  

## 2015-04-08 NOTE — Progress Notes (Signed)
Pre visit review using our clinic review tool, if applicable. No additional management support is needed unless otherwise documented below in the visit note. 

## 2015-04-08 NOTE — Progress Notes (Signed)
HPI  Pt presents to the clinic today with c/o headache, runny nose and cough. This started yesterday. She is blowing clear mucous out of her nose. The cough is nonproductive. She denies shortness of breath. She has run low grade fevers. She has tried Benadryl without much relief. She does have a history of seasonal allergies and reports she ran out of her antihistamine 3 days ago. She has not had sick contacts that she is aware of. She does not take flu shots.  Review of Systems      Past Medical History  Diagnosis Date  . Dermatophytosis of scalp and beard   . Mixed hyperlipidemia   . Lumbago     bulging disk per MRI   . Irritable bowel syndrome   . Essential and other specified forms of tremor   . Allergic rhinitis, cause unspecified   . Unspecified asthma(493.90)   . HTN (hypertension)   . Other acute reactions to stress   . Other diseases of lung, not elsewhere classified     solitary pulm. nodule(left)  . Unspecified vitamin D deficiency   . Diverticulosis     Family History  Problem Relation Age of Onset  . Other Father     Trigeminal neuralgia  . Stroke Mother   . Tremor Mother   . Diabetes Mother   . Heart failure Mother   . Diabetes Sister   . Coronary artery disease Sister   . Irritable bowel syndrome Sister   . Tremor Sister   . Diabetes Brother     x 3  . Coronary artery disease Brother     x 3  . Irritable bowel syndrome Brother     x 3  . Tremor Brother     x 3  . Breast cancer      Aunts and cousin  . Colon cancer Maternal Grandfather     Social History   Social History  . Marital Status: Divorced    Spouse Name: N/A  . Number of Children: 1  . Years of Education: N/A   Occupational History  . retired Other    [employer]  .     Social History Main Topics  . Smoking status: Former Smoker    Quit date: 05/17/1980  . Smokeless tobacco: Never Used  . Alcohol Use: No  . Drug Use: No  . Sexual Activity: Not on file   Other Topics  Concern  . 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 Codeine     REACTION: vomiting  . Topamax [Topiramate] Other (See Comments)    Hair loss     Constitutional: Positive headache, fatigue and fever. Denies abrupt weight changes.  HEENT:  Positive runny nose. Denies eye redness, eye pain, pressure behind the eyes, facial pain, nasal congestion, ear pain, ringing in the ears, wax buildup, or sore throat. Respiratory: Positive cough. Denies difficulty breathing or shortness of breath.  Cardiovascular: Denies chest pain, chest tightness, palpitations or swelling in the hands or feet.   No other specific complaints in a complete review of systems (except as listed in HPI above).  Objective:   BP 140/72 mmHg  Pulse 78  Temp(Src) 99.4 F (  37.4 C) (Oral)  Wt 141 lb (63.957 kg)  SpO2 98% Wt Readings from Last 3 Encounters:  04/08/15 141 lb (63.957 kg)  02/18/15 141 lb (63.957 kg)  11/14/14 147 lb (66.679 kg)     General: Appears her stated age, in NAD. HEENT: Head: normal shape and size, no sinus tenderness noted; Eyes: sclera white, no icterus, conjunctiva pink; Ears: Tm's gray and intact, normal light reflex; Nose: mucosa pink and moist, septum midline; Throat/Mouth: + PND. Teeth present, mucosa pink and moist, no exudate noted, no lesions or ulcerations noted.  Neck: No cervical lymphadenopathy.  Cardiovascular: Normal rate and rhythm. S1,S2 noted.  No murmur, rubs or gallops noted.  Pulmonary/Chest: Normal effort and positive vesicular breath sounds. No respiratory distress. No wheezes, rales or ronchi noted.      Assessment & Plan:   Viral Upper Respiratory Infection:  Get some rest and drink plenty of water Continue Zyrtec and Singulair Ibuprofen as needed for fever Get Mucinex and Delsym  OTC Return precautions given Work note provided to return Monday  RTC as needed or if symptoms persist.

## 2015-04-15 ENCOUNTER — Ambulatory Visit: Payer: PPO | Admitting: Neurology

## 2015-04-17 ENCOUNTER — Encounter: Payer: Self-pay | Admitting: Internal Medicine

## 2015-04-17 ENCOUNTER — Ambulatory Visit (INDEPENDENT_AMBULATORY_CARE_PROVIDER_SITE_OTHER): Payer: PPO | Admitting: Internal Medicine

## 2015-04-17 VITALS — BP 122/70 | HR 73 | Temp 98.0°F | Wt 140.0 lb

## 2015-04-17 DIAGNOSIS — H6691 Otitis media, unspecified, right ear: Secondary | ICD-10-CM | POA: Diagnosis not present

## 2015-04-17 MED ORDER — AMOXICILLIN 875 MG PO TABS: 875.0000 mg | ORAL_TABLET | Freq: Two times a day (BID) | ORAL | Status: DC

## 2015-04-17 NOTE — Progress Notes (Signed)
HPI  Pt presents to the clinic today with c/o headache, runny nose, ear pain and cough. This started 1 week ago. She is blowing yellow mucous out of her nose. The cough is nonproductive. She denies shortness of breath. She has run low grade fevers. She is having trouble hearing out of her right ear. She has tried Benadryl without much relief. She was seen 1 week ago for the same, advised to take Mucinex and Delsym, which she did not try because she reports it never works for her. She does have a history of seasonal allergies and takes zyrtec and Singulair. She has not had sick contacts that she is aware of. She does not take flu shots.  Review of Systems      Past Medical History  Diagnosis Date  . Dermatophytosis of scalp and beard   . Mixed hyperlipidemia   . Lumbago     bulging disk per MRI   . Irritable bowel syndrome   . Essential and other specified forms of tremor   . Allergic rhinitis, cause unspecified   . Unspecified asthma(493.90)   . HTN (hypertension)   . Other acute reactions to stress   . Other diseases of lung, not elsewhere classified     solitary pulm. nodule(left)  . Unspecified vitamin D deficiency   . Diverticulosis     Family History  Problem Relation Age of Onset  . Other Father     Trigeminal neuralgia  . Stroke Mother   . Tremor Mother   . Diabetes Mother   . Heart failure Mother   . Diabetes Sister   . Coronary artery disease Sister   . Irritable bowel syndrome Sister   . Tremor Sister   . Diabetes Brother     x 3  . Coronary artery disease Brother     x 3  . Irritable bowel syndrome Brother     x 3  . Tremor Brother     x 3  . Breast cancer      Aunts and cousin  . Colon cancer Maternal Grandfather     Social History   Social History  . Marital Status: Divorced    Spouse Name: N/A  . Number of Children: 1  . Years of Education: N/A   Occupational History  . retired Other    [employer]  .     Social History Main Topics  .  Smoking status: Former Smoker    Quit date: 05/17/1980  . Smokeless tobacco: Never Used  . Alcohol Use: No  . Drug Use: No  . Sexual Activity: Not on file   Other Topics Concern  . 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 Codeine     REACTION: vomiting  . Topamax [Topiramate] Other (See Comments)    Hair loss     Constitutional: Positive headache, fatigue and fever. Denies abrupt weight changes.  HEENT:  Positive ear pain, runny nose. Denies eye redness, eye pain, pressure behind the eyes, facial pain, nasal congestion, ear pain, ringing in the ears, wax buildup, or sore throat. Respiratory: Positive cough. Denies difficulty breathing or shortness of breath.  Cardiovascular: Denies chest pain, chest tightness, palpitations or  swelling in the hands or feet.   No other specific complaints in a complete review of systems (except as listed in HPI above).  Objective:   BP 122/70 mmHg  Pulse 73  Temp(Src) 98 F (36.7 C) (Oral)  Wt 140 lb (63.504 kg)  SpO2 98% Wt Readings from Last 3 Encounters:  04/17/15 140 lb (63.504 kg)  04/08/15 141 lb (63.957 kg)  02/18/15 141 lb (63.957 kg)     General: Appears her stated age, ill appearing in NAD. HEENT: Head: normal shape and size, no sinus tenderness noted; Eyes: sclera white, no icterus, conjunctiva pink; Right Ears: Tm's red but intact, distorted light reflex, + effusion; Nose: mucosa pink and moist, septum midline; Throat/Mouth: + PND. Teeth present, mucosa pink and moist, no exudate noted, no lesions or ulcerations noted.  Neck: No cervical lymphadenopathy.  Cardiovascular: Normal rate and rhythm. S1,S2 noted.  No murmur, rubs or gallops noted.  Pulmonary/Chest: Normal effort and positive vesicular breath sounds. No respiratory distress. No wheezes, rales  or ronchi noted.      Assessment & Plan:   Right otitis media:  Get some rest and drink plenty of water Continue Zyrtec and Singulair Ibuprofen as needed for fever eRx for Amoxil BID x 10 days Delsym as needed for cough  RTC as needed or if symptoms persist.

## 2015-04-17 NOTE — Progress Notes (Signed)
Pre visit review using our clinic review tool, if applicable. No additional management support is needed unless otherwise documented below in the visit note. 

## 2015-04-17 NOTE — Patient Instructions (Signed)

## 2015-04-24 ENCOUNTER — Ambulatory Visit: Payer: PPO | Admitting: Neurology

## 2015-05-02 ENCOUNTER — Encounter: Payer: Self-pay | Admitting: Neurology

## 2015-05-02 ENCOUNTER — Ambulatory Visit (INDEPENDENT_AMBULATORY_CARE_PROVIDER_SITE_OTHER): Payer: PPO | Admitting: Neurology

## 2015-05-02 VITALS — BP 118/78 | HR 75 | Ht 65.0 in | Wt 141.0 lb

## 2015-05-02 DIAGNOSIS — F330 Major depressive disorder, recurrent, mild: Secondary | ICD-10-CM | POA: Diagnosis not present

## 2015-05-02 DIAGNOSIS — G250 Essential tremor: Secondary | ICD-10-CM | POA: Diagnosis not present

## 2015-05-02 NOTE — Progress Notes (Signed)
Subjective:    Michelle Donovan was seen in consultation in the movement disorder clinic at the request of Eliezer Lofts, MD.  The evaluation is for tremor.  Pt has previously seen Dr. Leta Baptist and notes from 2012 were reviewed.  Pt reports that this was just a one time consultation in 2012.   The patient is a 69 y.o. right handed female with a history of tremor.   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  She states that it is in both hands but R is worse than the L (but she uses the right more). There is a family hx of tremor in her mother and brother.  She may use xanax very if going out (less than one time per month) to suppress tremor.    Affected by caffeine:  yes(drinks 8 oz coffee per da) Affected by alcohol:  unknown Affected by stress:  yes Affected by fatigue:  no Spills soup if on spoon:  yes (uses weighted utensils at home for this) Spills glass of liquid if full:  yes Affects ADL's (tying shoes, brushing teeth, etc):  no except trouble with eyeliner  11/13/13 update:  The patient returns today for follow up.  She was restarted on topamax last visit.  She was doing markedly better when it was first started but she has recently had some thyroid issues (multinodular goiter), pink eye, bilateral frozen shoulders and URI and tremor got worse during this.  She has been worked up for elevated liver enzymes but then repeat enzymes were normal and hepatitis panel was normal.  She had some paresthesias with the topamax on initiation.  She has lost some weight; no longer able to drink her coke as it tastes poorly with the topamax.    02/14/14 update:  Pt returns today for f/u.  Noted hair loss and called here and I d/c topamax 2 days ago.   Originally thought that hair loss due to highlights had put in hair over the summer but then realized that hair loss continued.   Pt frustrated as knew that topamax worked for tremor.   Has noted chin tremor.  Restarted back to work.    05/20/14 update:  Pt has a hx of ET.  Tried to d/c the topamax because of hair loss and start neurontin but tremor was not well controlled on neurontin alone and she really wanted wanted to go back on the topamax, knowing the risks.  She states that it isn't really falling out like it was but it isn't coming back like she hoped either.  She states that tremor isn't well controlled either.  She states that she wants to stay on the gabapentin because it is helping the frozen shoulder.  She is feeling depressed because of the tremor.  Asks if her thyroid nodules could be causing the tremor.  Her mother had a goiter and also had tremor.   02/18/15 update:  The patient presents today for follow-up.  I have not seen her since January, 2016.  She ended up wanting to increase the dose of Topamax, which she did to a total of 200 mg per day.  She sent me an email recently stating that tremor was less well controlled and she was having more hair loss.  She wanted to consider other options.  She admits that stress has increased greatly; her daughter and grandchild have moved back into her home.  She reports  that her daughter makes things very stressful for her.   She is noting tremor with eating, drinking, putting on makeup, writing.   She was on gabapentin for her shoulder pain but she has d/c it now.  She is on propranolol LA 120 mg daily for blood pressure.  We have talked about Artane in the past, but she has not wanted to try that.  She has tried only 1 pill of primidone in the past and it sounds like she had first dose effect and has not wanted to retry that.    05/02/15 update:  The patient is following up today regarding her essential tremor.  She is on Inderal LA, 120 mg daily.  Last visit, we cautiously start a low-dose clonazepam 0.5 mg, 1/2 tablet in the morning and half tablet at night. She states that the medication may be helping but not enough.  Fortunately,  it has not made her sleepy.   Her Topamax was discontinued because of side effects of hair loss. Still very stressed over living situation with daughter/grandson living in home.  Doesn't know what she will be doing for Christmas if anything and seems sad about that.  Her daughter is gone right now and her side of the family is going to [place] for christmas.  Current/Previously tried tremor medications: topamax;  Metoprolol - felt drained ; primidone (50 mg - 1/2 at night but got first dose effect and stopped it); meclizine (? Why); on propranolol now - ? Helping now  Current medications that may exacerbate tremor:  n/a  Outside reports reviewed: historical medical records and referral letter/letters.  Allergies  Allergen Reactions  . Codeine     REACTION: vomiting  . Topamax [Topiramate] Other (See Comments)    Hair loss    Current Outpatient Prescriptions on File Prior to Visit  Medication Sig Dispense Refill  . cetirizine (ZYRTEC) 10 MG tablet Take 10 mg by mouth daily.    . clonazePAM (KLONOPIN) 0.5 MG tablet Take 0.5 tablets (0.25 mg total) by mouth 2 (two) times daily. 30 tablet 5  . montelukast (SINGULAIR) 10 MG tablet Take 1 tablet (10 mg total) by mouth at bedtime. 90 tablet 1  . propranolol ER (INDERAL LA) 120 MG 24 hr capsule Take 1 capsule (120 mg total) by mouth daily. 90 capsule 1   No current facility-administered medications on file prior to visit.    Past Medical History  Diagnosis Date  . Dermatophytosis of scalp and beard   . Mixed hyperlipidemia   . Lumbago     bulging disk per MRI   . Irritable bowel syndrome   . Essential and other specified forms of tremor   . Allergic rhinitis, cause unspecified   . Unspecified asthma(493.90)   . HTN (hypertension)   . Other acute reactions to stress   . Other diseases of lung, not elsewhere classified     solitary pulm. nodule(left)  . Unspecified vitamin D deficiency   . Diverticulosis     Past Surgical History    Procedure Laterality Date  . Laparoscopic total hysterectomy  2002  . Tubal ligation  1986  . Tonsillectomy  1967/68  . Laparoscopic unilateral salpingo oopherectomy      Social History   Social History  . Marital Status: Divorced    Spouse Name: N/A  . Number of Children: 1  . Years of Education: N/A   Occupational History  . retired Other    [employer]  .  Social History Main Topics  . Smoking status: Former Smoker    Quit date: 05/17/1980  . Smokeless tobacco: Never Used  . Alcohol Use: No  . Drug Use: No  . Sexual Activity: Not on file   Other Topics Concern  . 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 Mother Deceased 21    CHF, Essential Tremor  . Father Deceased     old age, heart disease  . Brother Alive     diabetes  . Brother Alive     COPD, heart disease  . Brother Alive     Benign Essential Tremor  . Sister Alive     healthy  . Daughter Alive     anxiety    Review of Systems A complete 10 system ROS was obtained and was negative apart from what is mentioned.   Objective:   VITALS:   Filed Vitals:   05/02/15 1504  BP: 118/78  Pulse: 75  Height: 5\' 5"  (1.651 m)  Weight: 141 lb (63.957 kg)   Wt Readings from Last 3 Encounters:  05/02/15 141 lb (63.957 kg)  04/17/15 140 lb (63.504 kg)  04/08/15 141 lb (63.957 kg)    Gen:  Appears stated age and in NAD.  Flat affect today and seems depressed HEENT:  Normocephalic, atraumatic. The mucous membranes are moist. The superficial temporal arteries are without ropiness or tenderness. Cardiovascular: Regular rate and rhythm. Lungs: Clear to auscultation bilaterally. Neck: There are no carotid bruits noted bilaterally.  NEUROLOGICAL:  Orientation:  The patient is alert and oriented x 3.   Cranial  nerves: There is good facial symmetry.  Speech is fluent and clear. Soft palate rises symmetrically and there is no tongue deviation. Hearing is intact to conversational tone. Tone: Tone is good throughout. Sensation: Sensation is intact to light touch throughout. Coordination:  The patient has no dysdiadichokinesia or dysmetria. Motor: Strength is 5/5 in the bilateral upper and lower extremities.  Shoulder shrug is equal bilaterally.  There is no pronator drift.  There are no fasciculations noted. Gait and Station: The patient is able to ambulate without difficulty.   MOVEMENT EXAM: Tremor:  There is mild tremor of the outstretched hands, right more than L.  There is chin tremor.   Mild rest tremor bilaterally.  Able to pour a full glass of water from one glass to another without spilling it, although tremor is evident.  Labs:  Lab Results  Component Value Date   TSH 0.862 02/08/2014     Chemistry      Component Value Date/Time   NA 139 10/09/2014 0927   K 3.8 10/09/2014 0927   CL 108 10/09/2014 0927   CO2 24 10/09/2014 0927   BUN 15 10/09/2014 0927   CREATININE 0.86 10/09/2014 0927   CREATININE 0.77 10/05/2013 1526      Component Value Date/Time   CALCIUM 9.2 10/09/2014 0927   ALKPHOS 109 10/09/2014 0927   AST 20 10/09/2014 0927   ALT 26 10/09/2014 0927   BILITOT 0.4 10/09/2014 0927        Lab Results  Component Value Date   WBC 8.6 04/08/2013   HGB 11.6* 04/08/2013  HCT 34.6* 04/08/2013   MCV 88.7 04/08/2013   PLT 190 04/08/2013   Lab Results  Component Value Date   TSH 0.862 02/08/2014      Assessment/Plan:   1.  Essential Tremor.  --I had a very long discussion with the patient today.  I really would like her to retry primidone, as she only tried one pill in the past.  I explained to her that first dose effect only occurs with the first pill.  She is very nervous about reattempting it and wants to try something else.  She is on clonazepam, 0.5 mg, half a  tablet in the morning and half a tablet at night.  She is on propranolol LA 129 g daily.  Her pulse is not as slow this time so we could increase this.  In the end, she was to try to increase the clonazepam instead.  I asked her not to drive when we tried this.  She will try to take a full tablet in the morning and continue to have a tablet at night.  Risks, benefits, side effects and alternative therapies were discussed.  The opportunity to ask questions was given and they were answered to the best of my ability.  The patient expressed understanding and willingness to follow the outlined treatment protocols.  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  I did tell her that this is unlikely to improve with medication therapy.  -I do think that depression and anxiety are playing a role into increasing tremor.  The last several visits she has talked about her home situation.  If I do not get a good response with increasing the clonazepam, then perhaps trying an antidepressant. 2.  I will plan on seeing her back in the next few months, sooner should new neurologic issues arise.  I told her to call me when she needs a refill on the clonazepam.  I told her to be mindful of the holidays, as we will not refill controlled substances when the office is closed.  Much greater than 50% of this visit was spent in counseling with the patient.  Total face to face time:  25 min

## 2015-05-02 NOTE — Patient Instructions (Signed)
Increase Klonopin to 1 in the morning, 1/2 in the evening.

## 2015-05-18 DIAGNOSIS — K760 Fatty (change of) liver, not elsewhere classified: Secondary | ICD-10-CM

## 2015-05-18 HISTORY — DX: Fatty (change of) liver, not elsewhere classified: K76.0

## 2015-05-20 ENCOUNTER — Telehealth: Payer: Self-pay | Admitting: Neurology

## 2015-05-20 MED ORDER — CLONAZEPAM 0.5 MG PO TABS: ORAL_TABLET | ORAL | Status: DC

## 2015-05-20 NOTE — Telephone Encounter (Signed)
Clonazepam refill requested. Per last office note- patient to remain on medication. Refill approved with increase from last note and sent to patient's pharmacy.

## 2015-05-20 NOTE — Telephone Encounter (Signed)
Pt needs a refill on the clonazepam called into th [facility] in [place] pt number is [phone]

## 2015-06-09 ENCOUNTER — Ambulatory Visit (INDEPENDENT_AMBULATORY_CARE_PROVIDER_SITE_OTHER): Payer: PPO | Admitting: Internal Medicine

## 2015-06-09 ENCOUNTER — Encounter: Payer: Self-pay | Admitting: Internal Medicine

## 2015-06-09 VITALS — BP 118/68 | HR 62 | Temp 98.1°F | Wt 145.0 lb

## 2015-06-09 DIAGNOSIS — J029 Acute pharyngitis, unspecified: Secondary | ICD-10-CM | POA: Diagnosis not present

## 2015-06-09 NOTE — Progress Notes (Signed)
Pre visit review using our clinic review tool, if applicable. No additional management support is needed unless otherwise documented below in the visit note. 

## 2015-06-09 NOTE — Progress Notes (Signed)
Subjective:    Patient ID: Michelle Donovan, female    DOB: March 11, 1946, 70 y.o.   MRN: [id number]  HPI  Pt presents to the clinic today with c/o ear fullness, runny nose and sore throat. This started 3 days ago. She can hear "crackling/popping" in her ears. She has also noticed some swelling on the right side of her neck. She is blowing clear mucous out of her nose. She denies cough, chest congestion or shortness of breath. She denies fever, chills or body aches. She takes Singulair and Zyrtec for allergies and asthma. She has not had sick contacts that she is aware of.   Review of Systems  Past Medical History  Diagnosis Date  . Dermatophytosis of scalp and beard   . Mixed hyperlipidemia   . Lumbago     bulging disk per MRI   . Irritable bowel syndrome   . Essential and other specified forms of tremor   . Allergic rhinitis, cause unspecified   . Unspecified asthma(493.90)   . HTN (hypertension)   . Other acute reactions to stress   . Other diseases of lung, not elsewhere classified     solitary pulm. nodule(left)  . Unspecified vitamin D deficiency   . Diverticulosis     Current Outpatient Prescriptions  Medication Sig Dispense Refill  . cetirizine (ZYRTEC) 10 MG tablet Take 10 mg by mouth daily.    . clonazePAM (KLONOPIN) 0.5 MG tablet Take one in the morning, half in the evening 45 tablet 5  . montelukast (SINGULAIR) 10 MG tablet Take 1 tablet (10 mg total) by mouth at bedtime. 90 tablet 1  . propranolol ER (INDERAL LA) 120 MG 24 hr capsule Take 1 capsule (120 mg total) by mouth daily. 90 capsule 1   No current facility-administered medications for this visit.    Allergies  Allergen Reactions  . Codeine     REACTION: vomiting  . Topamax [Topiramate] Other (See Comments)    Hair loss    Family History  Problem Relation Age of Onset  . Other Father     Trigeminal neuralgia  . Stroke Mother   . Tremor Mother   . Diabetes Mother   . Heart failure Mother   .  Diabetes Sister   . Coronary artery disease Sister   . Irritable bowel syndrome Sister   . Tremor Sister   . Diabetes Brother     x 3  . Coronary artery disease Brother     x 3  . Irritable bowel syndrome Brother     x 3  . Tremor Brother     x 3  . Breast cancer      Aunts and cousin  . Colon cancer Maternal Grandfather     Social History   Social History  . Marital Status: Divorced    Spouse Name: N/A  . Number of Children: 1  . Years of Education: N/A   Occupational History  . retired Other    [place]  .     Social History Main Topics  . Smoking status: Former Smoker    Quit date: 05/17/1980  . Smokeless tobacco: Never Used  . Alcohol Use: No  . Drug Use: No  . Sexual Activity: Not on file   Other Topics Concern  . 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  HEENT: Pt reports ear fullness, runny nose and sore throat. Denies eye pain, eye redness, ear pain, ringing in the ears, wax buildup, nasal congestion, bloody nose. Respiratory: Denies difficulty breathing, shortness of breath, cough or sputum production.   Cardiovascular: Denies chest pain, chest tightness, palpitations or swelling in the hands or feet.   No other specific complaints in a complete review of systems (except as listed in HPI above).     Objective:   Physical Exam   BP 118/68 mmHg  Pulse 62  Temp(Src) 98.1 F (36.7 C) (Oral)  Wt 145 lb (65.772 kg)  SpO2 98% Wt Readings from Last 3 Encounters:  06/09/15 145 lb (65.772 kg)  05/02/15 141 lb (63.957 kg)  04/17/15 140 lb (63.504 kg)    General: Appears her stated age, well developed, well nourished in NAD. Skin: Warm, dry and intact. No rashes, lesions or ulcerations noted. HEENT: Head: normal shape and  size, no sinus tenderness noted; Eyes: sclera white, no icterus, conjunctiva pink; Ears: Tm's pink but intact, normal light reflex, + cerumen on the right; Nose: mucosa pink and moist, septum midline; Throat/Mouth: Teeth present, mucosa pink and moist, no exudate, lesions or ulcerations noted.  Neck:  Cervical adenopathy noted.  Cardiovascular: Normal rate and rhythm. S1,S2 noted.  No murmur, rubs or gallops noted.  Pulmonary/Chest: Normal effort and positive vesicular breath sounds. No respiratory distress. No wheezes, rales or ronchi noted.    BMET    Component Value Date/Time   NA 139 10/09/2014 0927   K 3.8 10/09/2014 0927   CL 108 10/09/2014 0927   CO2 24 10/09/2014 0927   GLUCOSE 104* 10/09/2014 0927   BUN 15 10/09/2014 0927   CREATININE 0.86 10/09/2014 0927   CREATININE 0.77 10/05/2013 1526   CALCIUM 9.2 10/09/2014 0927   GFRNONAA 89* 04/08/2013 2034   GFRAA >90 04/08/2013 2034    Lipid Panel     Component Value Date/Time   CHOL 268* 10/09/2014 0927   TRIG 182.0* 10/09/2014 0927   HDL 34.50* 10/09/2014 0927   CHOLHDL 8 10/09/2014 0927   VLDL 36.4 10/09/2014 0927   LDLCALC 197* 10/09/2014 0927    CBC    Component Value Date/Time   WBC 8.6 04/08/2013 2034   RBC 3.90 04/08/2013 2034   HGB 11.6* 04/08/2013 2034   HCT 34.6* 04/08/2013 2034   PLT 190 04/08/2013 2034   MCV 88.7 04/08/2013 2034   MCH 29.7 04/08/2013 2034   MCHC 33.5 04/08/2013 2034   RDW 13.0 04/08/2013 2034   LYMPHSABS 2.2 12/07/2012 0900   MONOABS 0.5 12/07/2012 0900   EOSABS 0.1 12/07/2012 0900   BASOSABS 0.0 12/07/2012 0900    Hgb A1C No results found for: HGBA1C      Assessment & Plan:   Viral Pharyngitis:  No indication of bacterial process Ibuprofen 400 mg TID prn Gargle with salt water or honey/lemon Continue all allergy medications  RTC as needed or if symptoms persist or worsen

## 2015-06-09 NOTE — Patient Instructions (Signed)

## 2015-07-10 ENCOUNTER — Telehealth: Payer: Self-pay | Admitting: Neurology

## 2015-07-10 NOTE — Telephone Encounter (Signed)
Pt needs to talk to someone about medciation [phone]

## 2015-07-10 NOTE — Telephone Encounter (Signed)
Old clonazepam RX got refilled instead of new RX with higher dosage. Pharmacy contacted and they are deleting old RX and running new RX through the system so patient can get a refill. They will call patient directly when filled. Patient made aware.

## 2015-07-25 ENCOUNTER — Telehealth: Payer: Self-pay | Admitting: Family Medicine

## 2015-07-25 MED ORDER — VALACYCLOVIR HCL 1 G PO TABS: ORAL_TABLET | ORAL | Status: DC

## 2015-07-25 NOTE — Telephone Encounter (Signed)
Rx sent in

## 2015-07-25 NOTE — Telephone Encounter (Signed)
Michelle Donovan notified prescription has been sent in as requested.

## 2015-07-25 NOTE — Telephone Encounter (Signed)
Pt called requesting valtrex be called in for fever blister on bottom lip without being seen.  [facility]

## 2015-07-30 DIAGNOSIS — H52222 Regular astigmatism, left eye: Secondary | ICD-10-CM | POA: Diagnosis not present

## 2015-07-30 DIAGNOSIS — Z0100 Encounter for examination of eyes and vision without abnormal findings: Secondary | ICD-10-CM | POA: Diagnosis not present

## 2015-07-30 DIAGNOSIS — H5212 Myopia, left eye: Secondary | ICD-10-CM | POA: Diagnosis not present

## 2015-07-30 DIAGNOSIS — H524 Presbyopia: Secondary | ICD-10-CM | POA: Diagnosis not present

## 2015-08-01 ENCOUNTER — Encounter: Payer: Self-pay | Admitting: Neurology

## 2015-08-01 ENCOUNTER — Ambulatory Visit (INDEPENDENT_AMBULATORY_CARE_PROVIDER_SITE_OTHER): Payer: PPO | Admitting: Neurology

## 2015-08-01 VITALS — BP 110/70 | HR 70 | Ht 65.0 in | Wt 151.0 lb

## 2015-08-01 DIAGNOSIS — G250 Essential tremor: Secondary | ICD-10-CM

## 2015-08-01 DIAGNOSIS — F330 Major depressive disorder, recurrent, mild: Secondary | ICD-10-CM | POA: Diagnosis not present

## 2015-08-01 MED ORDER — PROPRANOLOL HCL ER 160 MG PO CP24: 160.0000 mg | ORAL_CAPSULE | Freq: Every day | ORAL | Status: DC

## 2015-08-01 NOTE — Progress Notes (Signed)
Subjective:    Michelle Donovan was seen in consultation in the movement disorder clinic at the request of Eliezer Lofts, MD.  The evaluation is for tremor.  Pt has previously seen Dr. Leta Baptist and notes from 2012 were reviewed.  Pt reports that this was just a one time consultation in 2012.   The patient is a 70 y.o. right handed female with a history of tremor.   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  She states that it is in both hands but R is worse than the L (but she uses the right more). There is a family hx of tremor in her mother and brother.  She may use xanax very if going out (less than one time per month) to suppress tremor.    Affected by caffeine:  yes(drinks 8 oz coffee per da) Affected by alcohol:  unknown Affected by stress:  yes Affected by fatigue:  no Spills soup if on spoon:  yes (uses weighted utensils at home for this) Spills glass of liquid if full:  yes Affects ADL's (tying shoes, brushing teeth, etc):  no except trouble with eyeliner  11/13/13 update:  The patient returns today for follow up.  She was restarted on topamax last visit.  She was doing markedly better when it was first started but she has recently had some thyroid issues (multinodular goiter), pink eye, bilateral frozen shoulders and URI and tremor got worse during this.  She has been worked up for elevated liver enzymes but then repeat enzymes were normal and hepatitis panel was normal.  She had some paresthesias with the topamax on initiation.  She has lost some weight; no longer able to drink her coke as it tastes poorly with the topamax.    02/14/14 update:  Pt returns today for f/u.  Noted hair loss and called here and I d/c topamax 2 days ago.   Originally thought that hair loss due to highlights had put in hair over the summer but then realized that hair loss continued.   Pt frustrated as knew that topamax worked for tremor.   Has noted chin tremor.  Restarted back to work.    05/20/14 update:  Pt has a hx of ET.  Tried to d/c the topamax because of hair loss and start neurontin but tremor was not well controlled on neurontin alone and she really wanted wanted to go back on the topamax, knowing the risks.  She states that it isn't really falling out like it was but it isn't coming back like she hoped either.  She states that tremor isn't well controlled either.  She states that she wants to stay on the gabapentin because it is helping the frozen shoulder.  She is feeling depressed because of the tremor.  Asks if her thyroid nodules could be causing the tremor.  Her mother had a goiter and also had tremor.   02/18/15 update:  The patient presents today for follow-up.  I have not seen her since January, 2016.  She ended up wanting to increase the dose of Topamax, which she did to a total of 200 mg per day.  She sent me an email recently stating that tremor was less well controlled and she was having more hair loss.  She wanted to consider other options.  She admits that stress has increased greatly; her daughter and grandchild have moved back into her home.  She reports  that her daughter makes things very stressful for her.   She is noting tremor with eating, drinking, putting on makeup, writing.   She was on gabapentin for her shoulder pain but she has d/c it now.  She is on propranolol LA 120 mg daily for blood pressure.  We have talked about Artane in the past, but she has not wanted to try that.  She has tried only 1 pill of primidone in the past and it sounds like she had first dose effect and has not wanted to retry that.    05/02/15 update:  The patient is following up today regarding her essential tremor.  She is on Inderal LA, 120 mg daily.  Last visit, we cautiously start a low-dose clonazepam 0.5 mg, 1/2 tablet in the morning and half tablet at night. She states that the medication may be helping but not enough.  Fortunately,  it has not made her sleepy.   Her Topamax was discontinued because of side effects of hair loss. Still very stressed over living situation with daughter/grandson living in home.  Doesn't know what she will be doing for Christmas if anything and seems sad about that.  Her daughter is gone right now and her side of the family is going to [place] for christmas.  08/01/15 update:  The patient is following up today regarding her essential tremor.  She is on Inderal LA, 120 mg daily.  Last visit, we cautiously increase her clonazepam 0.5 mg, so that she is taking 1 tablet in the morning and half a tablet at night.  Unfortunately, despite my urging, she has refused to try primidone, because she had a first dose effect in the past when she took one tablet.  She states today that tremor has not been well controlled; she has occasional good days but they are few and far between.  Current/Previously tried tremor medications: topamax;  Metoprolol - felt drained ; primidone (50 mg - 1/2 at night but got first dose effect and stopped it); meclizine (? Why); on propranolol now - ? Helping now  Current medications that may exacerbate tremor:  n/a  Outside reports reviewed: historical medical records and referral letter/letters.  Allergies  Allergen Reactions  . Codeine     REACTION: vomiting  . Topamax [Topiramate] Other (See Comments)    Hair loss    Current Outpatient Prescriptions on File Prior to Visit  Medication Sig Dispense Refill  . cetirizine (ZYRTEC) 10 MG tablet Take 10 mg by mouth daily.    . clonazePAM (KLONOPIN) 0.5 MG tablet Take one in the morning, half in the evening 45 tablet 5  . montelukast (SINGULAIR) 10 MG tablet Take 1 tablet (10 mg total) by mouth at bedtime. 90 tablet 1  . propranolol ER (INDERAL LA) 120 MG 24 hr capsule Take 1 capsule (120 mg total) by mouth daily. 90 capsule 1   No current facility-administered medications on file prior to visit.    Past Medical History    Diagnosis Date  . Dermatophytosis of scalp and beard   . Mixed hyperlipidemia   . Lumbago     bulging disk per MRI   . Irritable bowel syndrome   . Essential and other specified forms of tremor   . Allergic rhinitis, cause unspecified   . Unspecified asthma(493.90)   . HTN (hypertension)   . Other acute reactions to stress   . Other diseases of lung, not elsewhere classified     solitary pulm. nodule(left)  .  Unspecified vitamin D deficiency   . Diverticulosis     Past Surgical History  Procedure Laterality Date  . Laparoscopic total hysterectomy  2002  . Tubal ligation  1986  . Tonsillectomy  1967/68  . Laparoscopic unilateral salpingo oopherectomy      Social History   Social History  . Marital Status: Divorced    Spouse Name: N/A  . Number of Children: 1  . Years of Education: N/A   Occupational History  . retired Other    [employer] [place]  .     Social History Main Topics  . Smoking status: Former Smoker    Quit date: 05/17/1980  . Smokeless tobacco: Never Used  . Alcohol Use: No  . Drug Use: No  . Sexual Activity: Not on file   Other Topics Concern  . 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 Mother Deceased 57    CHF, Essential Tremor  . Father Deceased     old age, heart disease  . Brother Alive     diabetes  . Brother Alive     COPD, heart disease  . Brother Alive     Benign Essential Tremor  . Sister Alive     healthy  . Daughter Alive     anxiety    Review of Systems A complete 10 system ROS was obtained and was negative apart from what is mentioned.   Objective:   VITALS:   Filed Vitals:   08/01/15 1357  BP: 110/70  Pulse: 70  Height: 5\' 5"  (1.651 m)  Weight: 151 lb (68.493 kg)   Wt Readings from Last 3 Encounters:  08/01/15 151 lb (68.493  kg)  06/09/15 145 lb (65.772 kg)  05/02/15 141 lb (63.957 kg)    Gen:  Appears stated age and in NAD.  Flat affect today and seems depressed HEENT:  Normocephalic, atraumatic. The mucous membranes are moist. The superficial temporal arteries are without ropiness or tenderness. Cardiovascular: Regular rate and rhythm. Lungs: Clear to auscultation bilaterally. Neck: There are no carotid bruits noted bilaterally.  NEUROLOGICAL:  Orientation:  The patient is alert and oriented x 3.   Cranial nerves: There is good facial symmetry.  Speech is fluent and clear. Soft palate rises symmetrically and there is no tongue deviation. Hearing is intact to conversational tone. Tone: Tone is good throughout. Sensation: Sensation is intact to light touch throughout. Coordination:  The patient has no dysdiadichokinesia or dysmetria. Motor: Strength is 5/5 in the bilateral upper and lower extremities.  Shoulder shrug is equal bilaterally.  There is no pronator drift.  There are no fasciculations noted. Gait and Station: The patient is able to ambulate without difficulty.   MOVEMENT EXAM: Tremor:  There is mild tremor of the outstretched hands, right more than L.  There is chin tremor.   Mild rest tremor bilaterally.  Able to pour a full glass of water from one glass to another without spilling it, although tremor is evident.  Labs:  Lab Results  Component Value Date   TSH 0.862 02/08/2014     Chemistry      Component Value Date/Time   NA 139 10/09/2014 [id number]  K 3.8 10/09/2014 0927   CL 108 10/09/2014 0927   CO2 24 10/09/2014 0927   BUN 15 10/09/2014 0927   CREATININE 0.86 10/09/2014 0927   CREATININE 0.77 10/05/2013 1526      Component Value Date/Time   CALCIUM 9.2 10/09/2014 0927   ALKPHOS 109 10/09/2014 0927   AST 20 10/09/2014 0927   ALT 26 10/09/2014 0927   BILITOT 0.4 10/09/2014 0927        Lab Results  Component Value Date   WBC 8.6 04/08/2013   HGB 11.6* 04/08/2013   HCT  34.6* 04/08/2013   MCV 88.7 04/08/2013   PLT 190 04/08/2013   Lab Results  Component Value Date   TSH 0.862 02/08/2014      Assessment/Plan:   1.  Essential Tremor.  --I had a very long discussion with the patient today.  I really would like her to retry primidone, as she only tried one pill in the past.  I explained to her that first dose effect only occurs with the first pill.  She is very nervous about reattempting it .  She is on clonazepam, 0.5 mg, one tablet in the morning and half a tablet at night.  Will try to increase propranolol LA to 160mg  cautiously.  Will let me know if she has any SE.   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  I did tell her that this is unlikely to improve with medication therapy.  -I do think that depression and anxiety are playing a role into increasing tremor.  The last several visits she has talked about her home situation. May try antidepressant in future. 2.  I will plan on seeing her back in the next few months, sooner should new neurologic issues arise.  Much greater than 50% of this visit was spent in counseling with the patient and the family.  Total face to face time:  30 min

## 2015-08-13 ENCOUNTER — Encounter: Payer: Self-pay | Admitting: Family Medicine

## 2015-08-13 ENCOUNTER — Ambulatory Visit (INDEPENDENT_AMBULATORY_CARE_PROVIDER_SITE_OTHER): Payer: PPO | Admitting: Family Medicine

## 2015-08-13 VITALS — BP 120/70 | HR 78 | Temp 101.1°F | Wt 147.0 lb

## 2015-08-13 DIAGNOSIS — R509 Fever, unspecified: Secondary | ICD-10-CM

## 2015-08-13 DIAGNOSIS — J111 Influenza due to unidentified influenza virus with other respiratory manifestations: Secondary | ICD-10-CM

## 2015-08-13 LAB — POCT INFLUENZA A/B: INFLUENZA A, POC: POSITIVE — AB

## 2015-08-13 MED ORDER — OSELTAMIVIR PHOSPHATE 75 MG PO CAPS: 75.0000 mg | ORAL_CAPSULE | Freq: Two times a day (BID) | ORAL | Status: DC

## 2015-08-13 NOTE — Addendum Note (Signed)
Addended by: Lurlean Nanny on: 08/13/2015 03:39 PM   Modules accepted: Orders

## 2015-08-13 NOTE — Progress Notes (Signed)
Pre visit review using our clinic review tool, if applicable. No additional management support is needed unless otherwise documented below in the visit note. 

## 2015-08-13 NOTE — Patient Instructions (Signed)

## 2015-08-13 NOTE — Progress Notes (Signed)
Subjective:  Patient ID: Michelle Donovan, female    DOB: 1945-12-26  Age: 70 y.o. MRN: [id number]  CC: Flu symptoms  HPI:  70 year old female with a past medical history of asthma presents with flulike symptoms.  Flu symptoms  Patient states she's been sick since Monday.   She's been experiencing cough, fever, bodyaches, chest discomfort, sinus pressure, and nausea.   No known exacerbating or relieving factors.    No interventions tried.   No other complaints today.  Social Hx   Social History   Social History  . Marital Status: Divorced    Spouse Name: N/A  . Number of Children: 1  . Years of Education: N/A   Occupational History  . retired Other    [employer]  .     Social History Main Topics  . Smoking status: Former Smoker    Quit date: 05/17/1980  . Smokeless tobacco: Never Used  . Alcohol Use: No  . Drug Use: No  . Sexual Activity: Not Asked   Other Topics Concern  . 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  HENT: Positive for sinus pressure.   Respiratory: Positive for cough and chest tightness.   Gastrointestinal: Positive for nausea.  Musculoskeletal:       Body aches.    Objective:  BP 120/70 mmHg  Pulse 78  Temp(Src) 101.1 F (38.4 C) (Oral)  Wt 147 lb (66.679 kg)  SpO2 95%  BP/Weight 08/13/2015 08/01/2015 [id number]  Systolic BP [id number]  Diastolic BP 70 70 68  Wt. (Lbs) 147 151 145  BMI 24.46 25.13 24.13   Physical Exam  Constitutional: She is oriented to person, place, and time.  Appears sick/ill but in NAD.   HENT:  Head: Normocephalic.  Cardiovascular: Normal rate and regular rhythm.   Pulmonary/Chest: Effort normal and breath sounds normal. She has no wheezes. She has no rales.  Neurological: She is alert and oriented to person,  place, and time.  Tremor noted.    Lab Results  Component Value Date   WBC 8.6 04/08/2013   HGB 11.6* 04/08/2013   HCT 34.6* 04/08/2013   PLT 190 04/08/2013   GLUCOSE 104* 10/09/2014   CHOL 268* 10/09/2014   TRIG 182.0* 10/09/2014   HDL 34.50* 10/09/2014   LDLDIRECT 193.1 12/07/2012   LDLCALC 197* 10/09/2014   ALT 26 10/09/2014   AST 20 10/09/2014   NA 139 10/09/2014   K 3.8 10/09/2014   CL 108 10/09/2014   CREATININE 0.86 10/09/2014   BUN 15 10/09/2014   CO2 24 10/09/2014   TSH 0.862 02/08/2014   INR 0.9 05/31/2007    Assessment & Plan:   Problem List Items Addressed This Visit    Influenza - Primary    New problem. Rapid influenza positive today. Within treatment window. Treating with Tamiflu. Out of work until Monday.       Relevant Medications   oseltamivir (TAMIFLU) 75 MG capsule      Meds ordered this encounter  Medications  . oseltamivir (TAMIFLU) 75 MG capsule    Sig: Take 1 capsule (75 mg total) by mouth 2 (two) times daily.    Dispense:  10 capsule  Refill:  0    Follow-up: PRN   [place]

## 2015-08-13 NOTE — Assessment & Plan Note (Signed)
New problem. Rapid influenza positive today. Within treatment window. Treating with Tamiflu. Out of work until Monday.

## 2015-08-27 ENCOUNTER — Encounter: Payer: Self-pay | Admitting: Neurology

## 2015-09-04 DIAGNOSIS — L720 Epidermal cyst: Secondary | ICD-10-CM | POA: Diagnosis not present

## 2015-09-04 DIAGNOSIS — D1801 Hemangioma of skin and subcutaneous tissue: Secondary | ICD-10-CM | POA: Diagnosis not present

## 2015-09-04 DIAGNOSIS — Z85828 Personal history of other malignant neoplasm of skin: Secondary | ICD-10-CM | POA: Diagnosis not present

## 2015-09-04 DIAGNOSIS — L308 Other specified dermatitis: Secondary | ICD-10-CM | POA: Diagnosis not present

## 2015-09-04 DIAGNOSIS — D2261 Melanocytic nevi of right upper limb, including shoulder: Secondary | ICD-10-CM | POA: Diagnosis not present

## 2015-09-04 DIAGNOSIS — L821 Other seborrheic keratosis: Secondary | ICD-10-CM | POA: Diagnosis not present

## 2015-09-04 DIAGNOSIS — D2271 Melanocytic nevi of right lower limb, including hip: Secondary | ICD-10-CM | POA: Diagnosis not present

## 2015-09-04 DIAGNOSIS — L570 Actinic keratosis: Secondary | ICD-10-CM | POA: Diagnosis not present

## 2015-09-15 ENCOUNTER — Telehealth: Payer: Self-pay | Admitting: Family Medicine

## 2015-09-15 NOTE — Telephone Encounter (Signed)
LM for pt to sch [facility]

## 2015-09-22 ENCOUNTER — Encounter: Payer: Self-pay | Admitting: Internal Medicine

## 2015-09-22 ENCOUNTER — Telehealth: Payer: Self-pay | Admitting: Family Medicine

## 2015-09-22 ENCOUNTER — Ambulatory Visit (INDEPENDENT_AMBULATORY_CARE_PROVIDER_SITE_OTHER): Payer: PPO | Admitting: Internal Medicine

## 2015-09-22 VITALS — BP 98/60 | HR 62 | Temp 97.5°F | Wt 151.0 lb

## 2015-09-22 DIAGNOSIS — K5792 Diverticulitis of intestine, part unspecified, without perforation or abscess without bleeding: Secondary | ICD-10-CM

## 2015-09-22 MED ORDER — AMOXICILLIN-POT CLAVULANATE 875-125 MG PO TABS: 1.0000 | ORAL_TABLET | Freq: Two times a day (BID) | ORAL | Status: DC

## 2015-09-22 NOTE — Progress Notes (Signed)
Subjective:    Patient ID: Michelle Donovan, female    DOB: [birth year], 70 y.o.   MRN: [id number]  HPI Here due to abdominal pain  Started 3 days ago "out of nowhere" Points to LLQ Then started with fever Chills and sweats at night Only could be comfortable when lying down Tender Appetite gone--but no nausea or vomiting Almost went to ER --but held off  Seems to be about the same till yesterday--fever went away Then fever recurred last night Pain is somewhat better No fever Appetite is returning  Current Outpatient Prescriptions on File Prior to Visit  Medication Sig Dispense Refill  . cetirizine (ZYRTEC) 10 MG tablet Take 10 mg by mouth daily.    . clonazePAM (KLONOPIN) 0.5 MG tablet Take one in the morning, half in the evening 45 tablet 5  . montelukast (SINGULAIR) 10 MG tablet Take 1 tablet (10 mg total) by mouth at bedtime. 90 tablet 1  . propranolol ER (INDERAL LA) 160 MG SR capsule Take 1 capsule (160 mg total) by mouth daily. 90 capsule 3   No current facility-administered medications on file prior to visit.    Allergies  Allergen Reactions  . Codeine     REACTION: vomiting  . Topamax [Topiramate] Other (See Comments)    Hair loss    Past Medical History  Diagnosis Date  . Dermatophytosis of scalp and beard   . Mixed hyperlipidemia   . Lumbago     bulging disk per MRI   . Irritable bowel syndrome   . Essential and other specified forms of tremor   . Allergic rhinitis, cause unspecified   . Unspecified asthma(493.90)   . HTN (hypertension)   . Other acute reactions to stress   . Other diseases of lung, not elsewhere classified     solitary pulm. nodule(left)  . Unspecified vitamin D deficiency   . Diverticulosis     Past Surgical History  Procedure Laterality Date  . Laparoscopic total hysterectomy  2002  . Tubal ligation  1986  . Tonsillectomy  1967/68  . Laparoscopic unilateral salpingo oopherectomy      Family History  Problem Relation  Age of Onset  . Other Father     Trigeminal neuralgia  . Stroke Mother   . Tremor Mother   . Diabetes Mother   . Heart failure Mother   . Diabetes Sister   . Coronary artery disease Sister   . Irritable bowel syndrome Sister   . Tremor Sister   . Diabetes Brother     x 3  . Coronary artery disease Brother     x 3  . Irritable bowel syndrome Brother     x 3  . Tremor Brother     x 3  . Breast cancer      Aunts and cousin  . Colon cancer Maternal Grandfather     Social History   Social History  . Marital Status: Divorced    Spouse Name: N/A  . Number of Children: 1  . Years of Education: N/A   Occupational History  . retired Other    [place]  .     Social History Main Topics  . Smoking status: Former Smoker    Quit date: 05/17/1980  . Smokeless tobacco: Never Used  . Alcohol Use: No  . Drug Use: No  . Sexual Activity: Not on file   Other Topics Concern  . 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 No distress.  Neck: Normal range of motion. Neck supple. No thyromegaly present.  Pulmonary/Chest: Effort normal and breath sounds normal. No respiratory distress. She has no wheezes. She has no rales.  Abdominal: Soft. Bowel sounds are normal. She exhibits no distension and no mass. There is no rebound and no guarding.  Moderate tenderness in LLQ No peritoneal signs  Lymphadenopathy:    She has no cervical adenopathy.          Assessment & Plan:

## 2015-09-22 NOTE — Progress Notes (Signed)
Pre visit review using our clinic review tool, if applicable. No additional management support is needed unless otherwise documented below in the visit note. 

## 2015-09-22 NOTE — Assessment & Plan Note (Signed)
Presentation seems fairly classic Only major in differential is kidney stone ---but never had this and no hematuria, etc Has had systemic symptoms but exam suggests no abscess Able to eat and drink now Bowels okay  Given ongoing symptoms after 3 days and sig tenderness---so need to start antibiotic. Will do 1 drug Rx with augmentin. If worsens, ER &/or CT scan

## 2015-09-22 NOTE — Telephone Encounter (Signed)
Pt has appt on 09/22/15 at 12:15 with Dr Silvio Pate.

## 2015-09-22 NOTE — Telephone Encounter (Signed)
Will check at appt--- ?diverticulitis?

## 2015-09-22 NOTE — Patient Instructions (Signed)
Please call if you are not getting better over the next 2 days or so. If you worsen, go to the ER.

## 2015-09-22 NOTE — Telephone Encounter (Signed)
[facility]  Patient Name: Michelle Donovan  DOB: [birth year]    Initial Comment Caller states Friday, she started with pain in lower left abd, low fever, having night sweats.   Nurse Assessment  Nurse: Wynetta Emery, RN, Baker Janus Date/Time Eilene Ghazi Time): 09/22/2015 8:34:31 AM  Confirm and document reason for call. If symptomatic, describe symptoms. You must click the next button to save text entered. ---Michelle Donovan started having pain in lower left abdomen and night sweats (bad) fever of 99.6 to 101.8 ---- abd sore to touch  Has the patient traveled out of the country within the last 30 days? ---No  Does the patient have any new or worsening symptoms? ---Yes  Will a triage be completed? ---Yes  Related visit to physician within the last 2 weeks? ---No  Does the PT have any chronic conditions? (i.e. diabetes, asthma, etc.) ---No  Is this a behavioral health or substance abuse call? ---No     Guidelines    Guideline Title Affirmed Question Affirmed Notes  Abdominal Pain - Female [1] MILD-MODERATE pain AND [2] constant AND [3] present > 2 hours    Final Disposition User   See Physician within 4 Hours (or PCP triage) Wynetta Emery, RN, Baker Janus    Comments  Dr. Diona Browner not in today -- appt given with Dr. Viviana Simpler at 1215pm 09-22-2015 lower left abd pain night sweats   Referrals  REFERRED TO PCP OFFICE   Disagree/Comply: Comply

## 2015-10-02 ENCOUNTER — Encounter: Payer: Self-pay | Admitting: Neurology

## 2015-10-09 ENCOUNTER — Ambulatory Visit (INDEPENDENT_AMBULATORY_CARE_PROVIDER_SITE_OTHER): Payer: PPO | Admitting: Podiatry

## 2015-10-09 ENCOUNTER — Ambulatory Visit (INDEPENDENT_AMBULATORY_CARE_PROVIDER_SITE_OTHER): Payer: PPO

## 2015-10-09 ENCOUNTER — Encounter: Payer: Self-pay | Admitting: Podiatry

## 2015-10-09 VITALS — BP 154/84 | HR 67 | Resp 18

## 2015-10-09 DIAGNOSIS — M8430XA Stress fracture, unspecified site, initial encounter for fracture: Secondary | ICD-10-CM | POA: Diagnosis not present

## 2015-10-09 DIAGNOSIS — R52 Pain, unspecified: Secondary | ICD-10-CM

## 2015-10-09 NOTE — Progress Notes (Signed)
Subjective:     Patient ID: Michelle Donovan, female   DOB: 02/08/1946, 70 y.o.   MRN: [id number]  HPI 70 year old female presents to the office for concerns of left foot pain which has been ongoing for about 1 month. She states that she's had swelling to the area and is painful when she puts pressure does walk to the foot. She said no recent treatment. She does have a stress fracture in this foot several years ago. No recent treatment. No other complaints.  Review of Systems  All other systems reviewed and are negative.      Objective:   Physical Exam General: AAO x3, NAD  Dermatological: Skin is warm, dry and supple bilateral. Nails x 10 are well manicured; remaining integument appears unremarkable at this time. There are no open sores, no preulcerative lesions, no rash or signs of infection present.  Vascular: Dorsalis Pedis artery and Posterior Tibial artery pedal pulses are 2/4 bilateral with immedate capillary fill time. Pedal hair growth present. No varicosities and no lower extremity edema present bilateral. There is no pain with calf compression, swelling, warmth, erythema.   Neruologic: Grossly intact via light touch bilateral. Vibratory intact via tuning fork bilateral. Protective threshold with Semmes Wienstein monofilament intact to all pedal sites bilateral. Patellar and Achilles deep tendon reflexes 2+ bilateral. No Babinski or clonus noted bilateral.   Musculoskeletal:Area pinpoint bony tenderness along the second and fourth metatarsal. There is overlying edema without any erythema or increase in warmth. There is no other areas of tenderness. Range of motion intact. MMT 5/5.  Gait: Unassisted, Nonantalgic.      Assessment:     Possible stress fracture left foot    Plan:     -Treatment options discussed including all alternatives, risks, and complications -Etiology of symptoms were discussed -X-rays were obtained and reviewed with the patient. No definitive evidence  of acute fracture at this time however given clinical symptoms concern for stress fracture and given her history of previous stress fracture in this foot. At this time immobilized in the surgical shoe. Ice and elevation. Limited activity. I'll see her back and couple weeks for repeat x-rays or sooner if any issues are to arise.  Celesta Gentile, DPM

## 2015-10-09 NOTE — Patient Instructions (Signed)
Stress Fracture Stress fracture is a small break or crack in a bone. A stress fracture can be fully broken (complete) or partially broken (incomplete). The most common sites for stress fractures are the bones in the front of your feet (metatarsals), your heels (calcaneus), and the long bone of your lower leg (tibia). CAUSES A stress fracture is caused by overuse or repetitive exercise, such as running. It happens when a bone cannot absorb any more shock because the muscles around it are weak. Stress fractures happen most commonly when:  You rapidly increase or start a new physical activity.  You use shoes that are worn out or do not fit you properly.  You exercise on a new surface. RISK FACTORS You may be at higher risk for this type of fracture if:  You have a condition that causes weak bones (osteoporosis).  You are female. Stress fractures are more likely to occur in women. SIGNS AND SYMPTOMS The most common symptom of a stress fracture is feeling pain when you are using the affected part of your body. The pain usually goes away when you are resting. Other symptoms may include:  Swelling of the affected area.  Pain in the area when it is touched.  Decreased pain while resting. Stress fracture pain usually develops over time. DIAGNOSIS Diagnosis may include:   Medical history and physical exam.  X-rays.  Bone scan.  MRI. TREATMENT Treatment depends on the severity of your stress fracture. Treatment usually involves resting, icing, compression, and elevation (RICE) of the affected part of your body. Treatment may also include:  Medicines to reduce inflammation.  A cast or a walking shoe.  Crutches.  Surgery. HOME CARE INSTRUCTIONS If You Have a Cast:  Do not stick anything inside the cast to scratch your skin. Doing that increases your risk of infection.  Check the skin around the cast every day. Report any concerns to your health care provider. You may put lotion  on dry skin around the edges of the cast. Do not apply lotion to the skin underneath the cast.  Keep the cast clean and dry.  Cover the cast with a watertight plastic bag to protect it from water while you take a bath or a shower. Do not let the cast get wet.  Do not put pressure on any part of the cast until it is fully hardened. This may take several hours. If You Have a Walking Shoe:  Wear it as directed by your health care provider. Managing Pain, Stiffness, and Swelling  If directed, apply ice to the injured area:  Put ice in a plastic bag.  Place a towel between your skin and the bag.  Leave the ice on for 20 minutes, 2-3 times per day.  Move your fingers or toes often to avoid stiffness and to lessen swelling.  Raise the injured area above the level of your heart while you are sitting or lying down. Activity  Rest as directed by your health care provider. Ask your health care provider if you may do alternative exercises, such as swimming or biking, while you are healing.  Return to your normal activities as directed by your health care provider. Ask your health care provider what activities are safe for you.  Perform range-of-motion exercises only as directed by your health care provider. Safety  Do not use the injured limb to support yourbody weight until your health care provider says that you can. Use crutches if your health care provider tells you to   do so. General Instructions  Do not use any tobacco products, including cigarettes, chewing tobacco, or electronic cigarettes. Tobacco can delay bone healing. If you need help quitting, ask your health care provider.  Take medicines only as directed by your health care provider.  Keep all follow-up visits as directed by your health care provider. This is important. PREVENTION  Only wear shoes that:  Fit well.  Are not worn out.  Eat a healthy diet that contains vitamin D and calcium. This helps keeps your bones  strong.  Be careful when you start a new physical activity. Give your body time to adjust.  Avoid doing only one kind of activity. Do different exercises, such as swimming and running, so that no single part of your body gets overused.  Do strength-training exercises. SEEK MEDICAL CARE IF:  Your pain gets worse.  You have new symptoms.  You have increased swelling. SEEK IMMEDIATE MEDICAL CARE IF:   You lose feeling in the affected area.   This information is not intended to replace advice given to you by your health care provider. Make sure you discuss any questions you have with your health care provider.   Document Released: 07/24/2002 Document Revised: 05/24/2014 Document Reviewed: 12/06/2013 Elsevier Interactive Patient Education 2016 [employer]  

## 2015-10-19 ENCOUNTER — Other Ambulatory Visit: Payer: Self-pay | Admitting: Family Medicine

## 2015-10-27 ENCOUNTER — Ambulatory Visit: Payer: Self-pay | Admitting: Podiatry

## 2015-10-30 ENCOUNTER — Ambulatory Visit (INDEPENDENT_AMBULATORY_CARE_PROVIDER_SITE_OTHER): Payer: PPO

## 2015-10-30 ENCOUNTER — Encounter: Payer: Self-pay | Admitting: Podiatry

## 2015-10-30 ENCOUNTER — Ambulatory Visit (INDEPENDENT_AMBULATORY_CARE_PROVIDER_SITE_OTHER): Payer: PPO | Admitting: Podiatry

## 2015-10-30 DIAGNOSIS — M8430XD Stress fracture, unspecified site, subsequent encounter for fracture with routine healing: Secondary | ICD-10-CM | POA: Diagnosis not present

## 2015-10-30 DIAGNOSIS — R52 Pain, unspecified: Secondary | ICD-10-CM

## 2015-10-30 DIAGNOSIS — M79673 Pain in unspecified foot: Secondary | ICD-10-CM | POA: Diagnosis not present

## 2015-10-30 DIAGNOSIS — R609 Edema, unspecified: Secondary | ICD-10-CM | POA: Diagnosis not present

## 2015-10-30 MED ORDER — METHYLPREDNISOLONE 4 MG PO TBPK: ORAL_TABLET | ORAL | Status: DC

## 2015-10-31 ENCOUNTER — Telehealth: Payer: Self-pay | Admitting: *Deleted

## 2015-10-31 DIAGNOSIS — R609 Edema, unspecified: Secondary | ICD-10-CM

## 2015-10-31 NOTE — Telephone Encounter (Signed)
Please order a venous duplex to rule out DVT.

## 2015-10-31 NOTE — Telephone Encounter (Signed)
Pt called to schedule [place] appt.

## 2015-11-02 NOTE — Progress Notes (Signed)
Patient ID: Michelle Donovan, female   DOB: 1945/11/02, 70 y.o.   MRN: [id number]  Subjective: 70 year old female presents the office today for follow-up evaluation of left foot pain. Since last upon in the right foot is also started become painful. She states the pain left is about the same. She has noticed increasing swelling to her foot as well. No redness or warmth. The right foot is also starting to swell some. No recent injury or trauma that she can recall. Denies any systemic complaints such as fevers, chills, nausea, vomiting. No acute changes since last appointment, and no other complaints at this time.   Objective: AAO x3, NAD; presents in surgical shoe and left foot. DP/PT pulses palpable bilaterally, CRT less than 3 seconds There is continued tenderness on the second and fourth metatarsals on the left foot missing area on the right foot is symptomatic as well. There does appear to be increase in edema to the left foot as well as to the leg without any erythema or increase in warmth to the left side worse than the right. There is no pain with calf compression, warmth, erythema. There is no palpable cord present.  MMT 5/5, ROM WNL. No edema, erythema, increase in warmth to bilateral lower extremities.  No open lesions or pre-ulcerative lesions.   Assessment: 70 year old female with bilateral foot pain, left stress fracture with increased swelling  Plan: -All treatment options discussed with the patient including all alternatives, risks, complications.  -X-rays were obtained and reviewed. There is no definitive evidence of acute fracture or stress fracture this time. -At this point prescribed Medrol Dosepak. -I discussed shoe modifications and orthotics. So for pain likely biomechanical in nature. -Ordered venous duplex to rule out DVT -Follow-up in 2 weeks or sooner if needed. -If symptoms persist we'll likely get an MRI. -Patient encouraged to call the office with any questions,  concerns, change in symptoms.   Celesta Gentile, DPM

## 2015-11-04 ENCOUNTER — Encounter: Payer: Self-pay | Admitting: Neurology

## 2015-11-04 ENCOUNTER — Ambulatory Visit (INDEPENDENT_AMBULATORY_CARE_PROVIDER_SITE_OTHER): Payer: PPO | Admitting: Neurology

## 2015-11-04 VITALS — BP 134/60 | HR 70 | Ht 65.0 in | Wt 157.0 lb

## 2015-11-04 DIAGNOSIS — G250 Essential tremor: Secondary | ICD-10-CM

## 2015-11-04 MED ORDER — TRIHEXYPHENIDYL HCL 2 MG PO TABS: 2.0000 mg | ORAL_TABLET | Freq: Two times a day (BID) | ORAL | Status: DC

## 2015-11-04 NOTE — Progress Notes (Signed)
Subjective:    Michelle Donovan was seen in consultation in the movement disorder clinic at the request of Eliezer Lofts, MD.  The evaluation is for tremor.  Pt has previously seen Dr. Leta Baptist and notes from 2012 were reviewed.  Pt reports that this was just a one time consultation in 2012.   The patient is a 70 y.o. right handed female with a history of tremor.   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  She states that it is in both hands but R is worse than the L (but she uses the right more). There is a family hx of tremor in her mother and brother.  She may use xanax very if going out (less than one time per month) to suppress tremor.    Affected by caffeine:  yes(drinks 8 oz coffee per da) Affected by alcohol:  unknown Affected by stress:  yes Affected by fatigue:  no Spills soup if on spoon:  yes (uses weighted utensils at home for this) Spills glass of liquid if full:  yes Affects ADL's (tying shoes, brushing teeth, etc):  no except trouble with eyeliner  11/13/13 update:  The patient returns today for follow up.  She was restarted on topamax last visit.  She was doing markedly better when it was first started but she has recently had some thyroid issues (multinodular goiter), pink eye, bilateral frozen shoulders and URI and tremor got worse during this.  She has been worked up for elevated liver enzymes but then repeat enzymes were normal and hepatitis panel was normal.  She had some paresthesias with the topamax on initiation.  She has lost some weight; no longer able to drink her coke as it tastes poorly with the topamax.    02/14/14 update:  Pt returns today for f/u.  Noted hair loss and called here and I d/c topamax 2 days ago.   Originally thought that hair loss due to highlights had put in hair over the summer but then realized that hair loss continued.   Pt frustrated as knew that topamax worked for tremor.   Has noted chin tremor.  Restarted back to work.    05/20/14 update:  Pt has a hx of ET.  Tried to d/c the topamax because of hair loss and start neurontin but tremor was not well controlled on neurontin alone and she really wanted wanted to go back on the topamax, knowing the risks.  She states that it isn't really falling out like it was but it isn't coming back like she hoped either.  She states that tremor isn't well controlled either.  She states that she wants to stay on the gabapentin because it is helping the frozen shoulder.  She is feeling depressed because of the tremor.  Asks if her thyroid nodules could be causing the tremor.  Her mother had a goiter and also had tremor.   02/18/15 update:  The patient presents today for follow-up.  I have not seen her since January, 2016.  She ended up wanting to increase the dose of Topamax, which she did to a total of 200 mg per day.  She sent me an email recently stating that tremor was less well controlled and she was having more hair loss.  She wanted to consider other options.  She admits that stress has increased greatly; her daughter and grandchild have moved back into her home.  She reports  that her daughter makes things very stressful for her.   She is noting tremor with eating, drinking, putting on makeup, writing.   She was on gabapentin for her shoulder pain but she has d/c it now.  She is on propranolol LA 120 mg daily for blood pressure.  We have talked about Artane in the past, but she has not wanted to try that.  She has tried only 1 pill of primidone in the past and it sounds like she had first dose effect and has not wanted to retry that.    05/02/15 update:  The patient is following up today regarding her essential tremor.  She is on Inderal LA, 120 mg daily.  Last visit, we cautiously start a low-dose clonazepam 0.5 mg, 1/2 tablet in the morning and half tablet at night. She states that the medication may be helping but not enough.  Fortunately,  it has not made her sleepy.   Her Topamax was discontinued because of side effects of hair loss. Still very stressed over living situation with daughter/grandson living in home.  Doesn't know what she will be doing for Christmas if anything and seems sad about that.  Her daughter is gone right now and her side of the family is going to [place] for christmas.  08/01/15 update:  The patient is following up today regarding her essential tremor.  She is on Inderal LA, 120 mg daily.  Last visit, we cautiously increase her clonazepam 0.5 mg, so that she is taking 1 tablet in the morning and half a tablet at night.  Unfortunately, despite my urging, she has refused to try primidone, because she had a first dose effect in the past when she took one tablet.  She states today that tremor has not been well controlled; she has occasional good days but they are few and far between.  11/04/15 update:  The patient is following up today regarding her essential tremor.  She is on Inderal LA which was increased last visit to 160 mg daily.  She went down to 120 mg daily because she was sleepy and she didn't think that it was helping much and asked me in an email about focused u/s.  I told her that insurance doesn't pay for this right now even though it was FDA approved.  She remains on clonazepam 0.5 mg, so that she is taking 1 tablet in the morning and half a tablet at night.  Unfortunately, despite my urging, she has refused to try primidone, because she had a first dose effect in the past when she took one tablet.  She states today that she continues to have tremor.  She is on prednisone currently because of "inflammation" in the top of the L foot.  She doesn't think that prednisone has changed tremor.   Current/Previously tried tremor medications: topamax;  Metoprolol - felt drained ; primidone (50 mg - 1/2 at night but got first dose effect and stopped it); meclizine (? Why); on propranolol now - ? Helping now;  gabapentin  Current medications that may exacerbate tremor:  n/a  Outside reports reviewed: historical medical records and referral letter/letters.  Allergies  Allergen Reactions  . Codeine     REACTION: vomiting  . Topamax [Topiramate] Other (See Comments)    Hair loss    Current Outpatient Prescriptions on File Prior to Visit  Medication Sig Dispense Refill  . cetirizine (ZYRTEC) 10 MG tablet Take 10 mg by mouth daily.    . clonazePAM (KLONOPIN)  0.5 MG tablet Take one in the morning, half in the evening 45 tablet 5  . methylPREDNISolone (MEDROL DOSEPAK) 4 MG TBPK tablet Take as directed 21 tablet 0  . montelukast (SINGULAIR) 10 MG tablet TAKE 1 TABLET BY MOUTH AT BEDTIME 90 tablet 0  . triamcinolone cream (KENALOG) 0.1 % Apply 1 application topically 2 (two) times daily as needed.  1   No current facility-administered medications on file prior to visit.    Past Medical History  Diagnosis Date  . Dermatophytosis of scalp and beard   . Mixed hyperlipidemia   . Lumbago     bulging disk per MRI   . Irritable bowel syndrome   . Essential and other specified forms of tremor   . Allergic rhinitis, cause unspecified   . Unspecified asthma(493.90)   . HTN (hypertension)   . Other acute reactions to stress   . Other diseases of lung, not elsewhere classified     solitary pulm. nodule(left)  . Unspecified vitamin D deficiency   . Diverticulosis     Past Surgical History  Procedure Laterality Date  . Laparoscopic total hysterectomy  2002  . Tubal ligation  1986  . Tonsillectomy  1967/68  . Laparoscopic unilateral salpingo oopherectomy      Social History   Social History  . Marital Status: Divorced    Spouse Name: N/A  . Number of Children: 1  . Years of Education: N/A   Occupational History  . retired Other    [employer]  .     Social History Main Topics  . Smoking status: Former Smoker    Quit date: 05/17/1980  . Smokeless tobacco: Never Used  . Alcohol  Use: No  . Drug Use: No  . Sexual Activity: Not on file   Other Topics Concern  . 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 Mother Deceased 41    CHF, Essential Tremor  . Father Deceased     old age, heart disease  . Brother Alive     diabetes  . Brother Alive     COPD, heart disease  . Brother Alive     Benign Essential Tremor  . Sister Alive     healthy  . Daughter Alive     anxiety    Review of Systems A complete 10 system ROS was obtained and was negative apart from what is mentioned.   Objective:   VITALS:   There were no vitals filed for this visit. Wt Readings from Last 3 Encounters:  09/22/15 151 lb (68.493 kg)  08/13/15 147 lb (66.679 kg)  08/01/15 151 lb (68.493 kg)    Gen:  Appears stated age and in NAD.  Flat affect today and seems depressed HEENT:  Normocephalic, atraumatic. The mucous membranes are moist. The superficial temporal arteries are without ropiness or tenderness. Cardiovascular: Regular rate and rhythm. Lungs: Clear to auscultation bilaterally. Neck: There are no carotid bruits noted bilaterally.  NEUROLOGICAL:  Orientation:  The patient is alert and oriented x 3.   Cranial nerves: There is good facial symmetry.  Speech is fluent and clear. Soft palate rises symmetrically and there is no tongue deviation. Hearing  is intact to conversational tone. Tone: Tone is good throughout. Sensation: Sensation is intact to light touch throughout. Coordination:  The patient has no dysdiadichokinesia or dysmetria. Motor: Strength is 5/5 in the bilateral upper and lower extremities.  Shoulder shrug is equal bilaterally.  There is no pronator drift.  There are no fasciculations noted. Gait and Station: The patient is able to ambulate without difficulty.   MOVEMENT  EXAM: Tremor:  There is mild tremor of the outstretched hands, right more than L.  This becomes moderate in nature with holding something of weight.  Archimedes spirals have deteriorated.   There is chin tremor.     Labs:  Lab Results  Component Value Date   TSH 0.862 02/08/2014     Chemistry      Component Value Date/Time   NA 139 10/09/2014 0927   K 3.8 10/09/2014 0927   CL 108 10/09/2014 0927   CO2 24 10/09/2014 0927   BUN 15 10/09/2014 0927   CREATININE 0.86 10/09/2014 0927   CREATININE 0.77 10/05/2013 1526      Component Value Date/Time   CALCIUM 9.2 10/09/2014 0927   ALKPHOS 109 10/09/2014 0927   AST 20 10/09/2014 0927   ALT 26 10/09/2014 0927   BILITOT 0.4 10/09/2014 0927        Lab Results  Component Value Date   WBC 8.6 04/08/2013   HGB 11.6* 04/08/2013   HCT 34.6* 04/08/2013   MCV 88.7 04/08/2013   PLT 190 04/08/2013   Lab Results  Component Value Date   TSH 0.862 02/08/2014      Assessment/Plan:   1.  Essential Tremor.  --I had a very long discussion with the patient today.  I really would like her to retry primidone, as she only tried one pill in the past but she doesn't want to do this.  Talked about artane, cogentin (and anticholinergic SE).  She mentions that topamax was only thing that helped but had SE.  Talked to her about trokendi, which generally has less SE than the older topamax (both still topiramate).   She ultimately decided to try artane.    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  I did tell her that this is unlikely to improve with medication therapy.  -Talked to her about surgical options, both DBS and focused u/s.  She may be DBS candidate but she doesn't want to look into that right now.   2.  I will plan on seeing her back in the next few months, sooner should new neurologic issues arise.  Much greater than 50% of this visit was spent in counseling with the patient and the family.  Total face to face time:  30  min

## 2015-11-04 NOTE — Patient Instructions (Signed)
Take artane 2 mg once per day.  We will call you in a few weeks (or send my chart message) to see how doing and if we should increase this

## 2015-11-05 ENCOUNTER — Telehealth: Payer: Self-pay | Admitting: Podiatry

## 2015-11-05 NOTE — Telephone Encounter (Signed)
Patient called, she said that Dr. Jacqualyn Posey ordered an ultrasound on her last week 10/30/15 when she came in for an appt. She has not heard anything from [place] about the date/ time of this appointment. Please call her on her Cell if you need to ctalk to her [phone] or please call her home [phone] and just leave a message with the date / time/ place of the ultrasound. She does not have vm on her cell phone. Thanks!

## 2015-11-06 NOTE — Telephone Encounter (Addendum)
Left message informing pt I would schedule the appt and leave the date and time on this line. Left message for [facility] to call to set up appt. [facility] called with appt 11/26/2015 at 1100am, and also put pt on the will call list if cancellations. Left message informing pt of the appt and left [facility] appt line.

## 2015-11-12 ENCOUNTER — Encounter: Payer: Self-pay | Admitting: Neurology

## 2015-11-13 ENCOUNTER — Encounter: Payer: Self-pay | Admitting: Podiatry

## 2015-11-13 ENCOUNTER — Ambulatory Visit (INDEPENDENT_AMBULATORY_CARE_PROVIDER_SITE_OTHER): Payer: PPO | Admitting: Podiatry

## 2015-11-13 DIAGNOSIS — T148XXA Other injury of unspecified body region, initial encounter: Secondary | ICD-10-CM

## 2015-11-13 DIAGNOSIS — T148 Other injury of unspecified body region: Secondary | ICD-10-CM | POA: Diagnosis not present

## 2015-11-13 DIAGNOSIS — M2010 Hallux valgus (acquired), unspecified foot: Secondary | ICD-10-CM

## 2015-11-13 NOTE — Progress Notes (Signed)
Patient ID: Michelle Donovan, female   DOB: [birth year], 70 y.o.   MRN: [id number]   Subjective: 70 year old female presents the office for follow valuation left swelling and pain. She is scheduled for a venous to proximal until July. She states that she does not feel that she needs to have the study done. She is completed with steroid and swelling has decreased. She still gets pain to her foot she points the second metatarsal. She states the pain is worse when she is not wearing supportive shoes and in a lot of walking. The pain is not on a daily basis at this time. Denies any systemic complaints such as fevers, chills, nausea, vomiting. No acute changes since last appointment, and no other complaints at this time.   Objective: AAO x3, NAD DP/PT pulses palpable bilaterally, CRT less than 3 seconds There is tenderness of the second metatarsal on the same area. There is no significant edema or erythema overlying the area. 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 There is no pain vibratory sensation.No areas of pinpoint bony tenderness or pain with vibratory sensation. MMT 5/5, ROM WNL. No edema, erythema, increase in warmth to bilateral lower extremities.  No open lesions or pre-ulcerative lesions.  No pain with calf compression, swelling, warmth, erythema  Assessment: 70 year old female with left foot pain likely due to biomechanical changes  Plan: -All treatment options discussed with the patient including all alternatives, risks, complications.  -At this time is no pain with calf compression or any swelling. She wishes to cancel the venous duplex. See signs or symptoms of other DVT digit call the office. -I discussed her orthotics have her she does not wear closed in shoes. Discussed with her single good arch support. She does have shoes that I was discussed with her that cause her feet are heard recommended her not to wear these. -Continue ice to the area -Compression anklet dispensed -Follow up in 4 pieces  symptoms continue. In the meantime call any questions or concerns. -Patient encouraged to call the office with any questions, concerns, change in symptoms.   Celesta Gentile, DPM

## 2015-11-25 ENCOUNTER — Encounter: Payer: Self-pay | Admitting: Neurology

## 2015-11-28 ENCOUNTER — Encounter: Payer: Self-pay | Admitting: Neurology

## 2015-11-28 MED ORDER — CLONAZEPAM 0.5 MG PO TABS: ORAL_TABLET | ORAL | Status: DC

## 2015-12-11 ENCOUNTER — Ambulatory Visit: Payer: PPO | Admitting: Podiatry

## 2015-12-27 ENCOUNTER — Telehealth: Payer: Self-pay | Admitting: Family Medicine

## 2015-12-27 NOTE — Telephone Encounter (Signed)
Pleas advise if okay to refill--last OV with you 10/2014

## 2015-12-29 NOTE — Telephone Encounter (Signed)
Pt need appt for CPX with labs prior. Will refill once.

## 2015-12-30 ENCOUNTER — Telehealth: Payer: Self-pay | Admitting: *Deleted

## 2015-12-30 MED ORDER — PROPRANOLOL HCL ER 120 MG PO CP24: 120.0000 mg | ORAL_CAPSULE | Freq: Every day | ORAL | 0 refills | Status: DC

## 2015-12-30 NOTE — Telephone Encounter (Signed)
Duplicate entry

## 2015-12-31 ENCOUNTER — Other Ambulatory Visit: Payer: Self-pay

## 2015-12-31 NOTE — Telephone Encounter (Signed)
Pt is requesting a refill. She said she has a physical scheduled on November. Ok to refill? Last filled on 10/19/15.

## 2016-01-01 MED ORDER — MONTELUKAST SODIUM 10 MG PO TABS: 10.0000 mg | ORAL_TABLET | Freq: Every day | ORAL | 3 refills | Status: DC

## 2016-01-06 NOTE — Telephone Encounter (Signed)
Appointment already made  Labs 10/25 cpx 11/3

## 2016-01-14 ENCOUNTER — Ambulatory Visit: Payer: PPO

## 2016-01-23 ENCOUNTER — Ambulatory Visit (INDEPENDENT_AMBULATORY_CARE_PROVIDER_SITE_OTHER): Payer: PPO

## 2016-01-23 ENCOUNTER — Telehealth: Payer: Self-pay | Admitting: Family Medicine

## 2016-01-23 VITALS — BP 112/64 | HR 56 | Temp 98.0°F | Ht 65.0 in | Wt 157.8 lb

## 2016-01-23 DIAGNOSIS — Z0000 Encounter for general adult medical examination without abnormal findings: Secondary | ICD-10-CM

## 2016-01-23 DIAGNOSIS — E559 Vitamin D deficiency, unspecified: Secondary | ICD-10-CM

## 2016-01-23 DIAGNOSIS — E782 Mixed hyperlipidemia: Secondary | ICD-10-CM

## 2016-01-23 DIAGNOSIS — E538 Deficiency of other specified B group vitamins: Secondary | ICD-10-CM

## 2016-01-23 NOTE — Progress Notes (Signed)
Subjective:   Michelle Donovan is a 70 y.o. female who presents for Medicare Annual (Subsequent) preventive examination.  Review of Systems:  N/A Cardiac Risk Factors include: advanced age (>88men, >16 women);hypertension;dyslipidemia     Objective:     Vitals: BP 112/64 (BP Location: Left Arm, Patient Position: Sitting, Cuff Size: Normal)   Pulse (!) 56   Temp 98 F (36.7 C) (Oral)   Ht 5\' 5"  (1.651 m)   Wt 157 lb 12 oz (71.6 kg)   SpO2 97%   BMI 26.25 kg/m   Body mass index is 26.25 kg/m.   Tobacco History  Smoking Status  . Former Smoker  . Quit date: 05/17/1980  Smokeless Tobacco  . Never Used     Counseling given: No   Past Medical History:  Diagnosis Date  . Allergic rhinitis, cause unspecified   . Dermatophytosis of scalp and beard   . Diverticulosis   . Essential and other specified forms of tremor   . HTN (hypertension)   . Irritable bowel syndrome   . Lumbago    bulging disk per MRI   . Mixed hyperlipidemia   . Other acute reactions to stress   . Other diseases of lung, not elsewhere classified    solitary pulm. nodule(left)  . Unspecified asthma(493.90)   . Unspecified vitamin D deficiency    Past Surgical History:  Procedure Laterality Date  . LAPAROSCOPIC TOTAL HYSTERECTOMY  2002  . LAPAROSCOPIC UNILATERAL SALPINGO OOPHERECTOMY    . TONSILLECTOMY  1967/68  . TUBAL LIGATION  1986   Family History  Problem Relation Age of Onset  . Stroke Mother   . Tremor Mother   . Diabetes Mother   . Heart failure Mother   . Other Father     Trigeminal neuralgia  . Diabetes Sister   . Coronary artery disease Sister   . Irritable bowel syndrome Sister   . Tremor Sister   . Diabetes Brother     x 3  . Coronary artery disease Brother     x 3  . Irritable bowel syndrome Brother     x 3  . Tremor Brother     x 3  . Breast cancer      Aunts and cousin  . Colon cancer Maternal Grandfather    History  Sexual Activity  . Sexual activity: No     Outpatient Encounter Prescriptions as of 01/23/2016  Medication Sig  . cetirizine (ZYRTEC) 10 MG tablet Take 10 mg by mouth daily.  . clonazePAM (KLONOPIN) 0.5 MG tablet Take one in the morning, half in the evening  . montelukast (SINGULAIR) 10 MG tablet Take 1 tablet (10 mg total) by mouth at bedtime.  . propranolol ER (INDERAL LA) 120 MG 24 hr capsule TAKE 1 CAPSULE (120 MG TOTAL) BY MOUTH DAILY.  Marland Kitchen triamcinolone cream (KENALOG) 0.1 % Apply 1 application topically 2 (two) times daily as needed.  . trihexyphenidyl (ARTANE) 2 MG tablet Take 1 tablet (2 mg total) by mouth 2 (two) times daily with a meal.  . [DISCONTINUED] methylPREDNISolone (MEDROL DOSEPAK) 4 MG TBPK tablet Take as directed  . [DISCONTINUED] propranolol ER (INDERAL LA) 120 MG 24 hr capsule Take 1 capsule (120 mg total) by mouth daily.   No facility-administered encounter medications on file as of 01/23/2016.     Activities of Daily Living In your present state of health, do you have any difficulty performing the following activities: 01/23/2016  Hearing? N  Vision? N  Difficulty concentrating or making decisions? N  Walking or climbing stairs? N  Dressing or bathing? N  Doing errands, shopping? N  Preparing Food and eating ? N  Using the Toilet? N  In the past six months, have you accidently leaked urine? N  Do you have problems with loss of bowel control? N  Managing your Medications? N  Managing your Finances? N  Housekeeping or managing your Housekeeping? N  Some recent data might be hidden    Patient Care Team: Michelle Sanders, MD as PCP - General Michelle Donovan, OD as Consulting Physician (Optometry) Michelle Clarks, DO as Consulting Physician (Neurology) Michelle Donovan, DPM as Consulting Physician (Podiatry)    Assessment:     Hearing Screening   125Hz  250Hz  500Hz  1000Hz  2000Hz  3000Hz  4000Hz  6000Hz  8000Hz   Right ear:   0 0 40  40    Left ear:   0 0 40  40    Vision Screening Comments: Last vision exam  07/30/15 with Dr. Zadie Donovan   Exercise Activities and Dietary recommendations Current Exercise Habits: The patient does not participate in regular exercise at present, Exercise limited by: None identified  Goals    . Increase water intake          Starting 01/23/16, I will attempt to drink at least 6-8 glasses of water daily.       Fall Risk Fall Risk  01/23/2016 10/11/2014  Falls in the past year? No No   Depression Screen PHQ 2/9 Scores 01/23/2016 10/11/2014  PHQ - 2 Score 0 0     Cognitive Testing MMSE - Mini Mental State Exam 01/23/2016  Orientation to time 5  Orientation to Place 5  Registration 3  Attention/ Calculation 0  Recall 3  Language- name 2 objects 0  Language- repeat 1  Language- follow 3 step command 3  Language- read & follow direction 0  Write a sentence 0  Copy design 0  Total score 20   PLEASE NOTE: A Mini-Cog screen was completed. Maximum score is 20. A value of 0 denotes this part of Folstein MMSE was not completed or the patient failed this part of the Mini-Cog screening.   Mini-Cog Screening Orientation to Time - Max 5 pts Orientation to Place - Max 5 pts Registration - Max 3 pts Recall - Max 3 pts Language Repeat - Max 1 pts Language Follow 3 Step Command - Max 3 pts  There is no immunization history for the selected administration types on file for this patient. Screening Tests Health Maintenance  Topic Date Due  . INFLUENZA VACCINE  01/22/2017 (Originally 12/16/2015)  . COLONOSCOPY  01/22/2017 (Originally 05/17/2014)  . PNA vac Low Risk Adult (1 of 2 - PCV13) 01/22/2017 (Originally 12/18/2012)  . MAMMOGRAM  10/20/2016  . TETANUS/TDAP  11/26/2020  . DEXA SCAN  Completed  . ZOSTAVAX  Addressed  . Hepatitis C Screening  Completed      Plan:     I have personally reviewed and addressed the Medicare Annual Wellness questionnaire and have noted the following in the patient's chart:  A. Medical and social history B. Use of alcohol, tobacco or illicit  drugs  C. Current medications and supplements D. Functional ability and status E.  Nutritional status F.  Physical activity G. Advance directives H. List of other physicians I.  Hospitalizations, surgeries, and ER visits in previous 12 months J.  Cecilia to include hearing, vision, cognitive, depression L. Referrals and appointments - none  In addition, I have reviewed and discussed with patient certain preventive protocols, quality metrics, and best practice recommendations. A written personalized care plan for preventive services as well as general preventive health recommendations were provided to patient.  See attached scanned questionnaire for additional information.   Signed,   Lindell Noe, MHA, BS, LPN Health Advisor

## 2016-01-23 NOTE — Progress Notes (Signed)
PCP notes:   Health maintenance:  Flu vaccine - pt declined PCV13 - pt declined Colonoscopy - pt will discuss with PCP at CPE  Abnormal screenings:   Hearing - failed  Patient concerns:   None  Nurse concerns:  None  Next PCP appt:   02/03/16 @ 1430

## 2016-01-23 NOTE — Telephone Encounter (Signed)
-----   Message from Eustace Pen, LPN sent at [phone]  3:19 PM EDT ----- Regarding: Lab Orders Dr. Diona Browner,  Rescheduled pt's CPE from 03/2016 to this month. Coming in Monday for Labs. Can you please place lab orders?  Thank you :)

## 2016-01-23 NOTE — Progress Notes (Signed)
I reviewed health advisor's note, was available for consultation, and agree with documentation and plan.  Lanny Lipkin, MD [facility] [place]  

## 2016-01-23 NOTE — Progress Notes (Signed)
Pre visit review using our clinic review tool, if applicable. No additional management support is needed unless otherwise documented below in the visit note. 

## 2016-01-23 NOTE — Patient Instructions (Signed)
Michelle Donovan , Thank you for taking time to come for your Medicare Wellness Visit. I appreciate your ongoing commitment to your health goals. Please review the following plan we discussed and let me know if I can assist you in the future.   These are the goals we discussed: Goals    . Increase water intake          Starting 01/23/16, I will attempt to drink at least 6-8 glasses of water daily.        This is a list of the screening recommended for you and due dates:  Health Maintenance  Topic Date Due  . Flu Shot  01/22/2017*  . Colon Cancer Screening  01/22/2017*  . Pneumonia vaccines (1 of 2 - PCV13) 01/22/2017*  . Mammogram  10/20/2016  . Tetanus Vaccine  11/26/2020  . DEXA scan (bone density measurement)  Completed  . Shingles Vaccine  Addressed  .  Hepatitis C: One time screening is recommended by Center for Disease Control  (CDC) for  adults born from [birth year] through 1963.   Completed  *Topic was postponed. The date shown is not the original due date.   Preventive Care for Adults  A healthy lifestyle and preventive care can promote health and wellness. Preventive health guidelines for adults include the following key practices.  . A routine yearly physical is a good way to check with your health care provider about your health and preventive screening. It is a chance to share any concerns and updates on your health and to receive a thorough exam.  . Visit your dentist for a routine exam and preventive care every 6 months. Brush your teeth twice a day and floss once a day. Good oral hygiene prevents tooth decay and gum disease.  . The frequency of eye exams is based on your age, health, family medical history, use  of contact lenses, and other factors. Follow your health care provider's ecommendations for frequency of eye exams.  . Eat a healthy diet. Foods like vegetables, fruits, whole grains, low-fat dairy products, and lean protein foods contain the nutrients you need without  too many calories. Decrease your intake of foods high in solid fats, added sugars, and salt. Eat the right amount of calories for you. Get information about a proper diet from your health care provider, if necessary.  . Regular physical exercise is one of the most important things you can do for your health. Most adults should get at least 150 minutes of moderate-intensity exercise (any activity that increases your heart rate and causes you to sweat) each week. In addition, most adults need muscle-strengthening exercises on 2 or more days a week.  Silver Sneakers may be a benefit available to you. To determine eligibility, you may visit the website: www.silversneakers.com or contact program at [phone] Mon-Fri between 8AM-8PM.   . Maintain a healthy weight. The body mass index (BMI) is a screening tool to identify possible weight problems. It provides an estimate of body fat based on height and weight. Your health care provider can find your BMI and can help you achieve or maintain a healthy weight.   For adults 20 years and older: ? A BMI below 18.5 is considered underweight. ? A BMI of 18.5 to 24.9 is normal. ? A BMI of 25 to 29.9 is considered overweight. ? A BMI of 30 and above is considered obese.   . Maintain normal blood lipids and cholesterol levels by exercising and minimizing your intake of saturated  fat. Eat a balanced diet with plenty of fruit and vegetables. Blood tests for lipids and cholesterol should begin at age 69 and be repeated every 5 years. If your lipid or cholesterol levels are high, you are over 50, or you are at high risk for heart disease, you may need your cholesterol levels checked more frequently. Ongoing high lipid and cholesterol levels should be treated with medicines if diet and exercise are not working.  . If you smoke, find out from your health care provider how to quit. If you do not use tobacco, please do not start.  . If you choose to drink alcohol,  please do not consume more than 2 drinks per day. One drink is considered to be 12 ounces (355 mL) of beer, 5 ounces (148 mL) of wine, or 1.5 ounces (44 mL) of liquor.  . If you are 70-57 years old, ask your health care provider if you should take aspirin to prevent strokes.  . Use sunscreen. Apply sunscreen liberally and repeatedly throughout the day. You should seek shade when your shadow is shorter than you. Protect yourself by wearing long sleeves, pants, a wide-brimmed hat, and sunglasses year round, whenever you are outdoors.  . Once a month, do a whole body skin exam, using a mirror to look at the skin on your back. Tell your health care provider of new moles, moles that have irregular borders, moles that are larger than a pencil eraser, or moles that have changed in shape or color.

## 2016-01-26 ENCOUNTER — Other Ambulatory Visit (INDEPENDENT_AMBULATORY_CARE_PROVIDER_SITE_OTHER): Payer: PPO

## 2016-01-26 DIAGNOSIS — E559 Vitamin D deficiency, unspecified: Secondary | ICD-10-CM | POA: Diagnosis not present

## 2016-01-26 DIAGNOSIS — E782 Mixed hyperlipidemia: Secondary | ICD-10-CM

## 2016-01-26 DIAGNOSIS — E538 Deficiency of other specified B group vitamins: Secondary | ICD-10-CM

## 2016-01-26 DIAGNOSIS — R7989 Other specified abnormal findings of blood chemistry: Secondary | ICD-10-CM | POA: Diagnosis not present

## 2016-01-26 LAB — COMPREHENSIVE METABOLIC PANEL
ALT: 79 U/L — AB (ref 0–35)
AST: 61 U/L — ABNORMAL HIGH (ref 0–37)
Albumin: 3.8 g/dL (ref 3.5–5.2)
Alkaline Phosphatase: 99 U/L (ref 39–117)
BUN: 11 mg/dL (ref 6–23)
CALCIUM: 9 mg/dL (ref 8.4–10.5)
CHLORIDE: 104 meq/L (ref 96–112)
CO2: 28 meq/L (ref 19–32)
Creatinine, Ser: 0.74 mg/dL (ref 0.40–1.20)
GFR: 82.93 mL/min (ref >60.00)
GLUCOSE: 114 mg/dL — AB (ref 70–99)
POTASSIUM: 4.2 meq/L (ref 3.5–5.1)
Sodium: 138 mEq/L (ref 135–145)
Total Bilirubin: 0.5 mg/dL (ref 0.2–1.2)
Total Protein: 6.9 g/dL (ref 6.0–8.3)

## 2016-01-26 LAB — LIPID PANEL
CHOL/HDL RATIO: 10
Cholesterol: 305 mg/dL — ABNORMAL HIGH (ref 0–200)
HDL: 31.1 mg/dL — AB (ref >39.00)
NONHDL: 273.47
Triglycerides: 236 mg/dL — ABNORMAL HIGH (ref 0.0–149.0)
VLDL: 47.2 mg/dL — ABNORMAL HIGH (ref 0.0–40.0)

## 2016-01-26 LAB — LDL CHOLESTEROL, DIRECT: Direct LDL: 226 mg/dL

## 2016-01-26 LAB — VITAMIN D 25 HYDROXY (VIT D DEFICIENCY, FRACTURES): VITD: 24.64 ng/mL — AB (ref 30.00–100.00)

## 2016-01-26 LAB — VITAMIN B12: Vitamin B-12: 383 pg/mL (ref 211–911)

## 2016-02-03 ENCOUNTER — Ambulatory Visit (INDEPENDENT_AMBULATORY_CARE_PROVIDER_SITE_OTHER): Payer: PPO | Admitting: Family Medicine

## 2016-02-03 ENCOUNTER — Encounter: Payer: Self-pay | Admitting: Family Medicine

## 2016-02-03 VITALS — BP 117/54 | HR 60 | Temp 97.9°F | Ht 65.0 in | Wt 158.2 lb

## 2016-02-03 DIAGNOSIS — E559 Vitamin D deficiency, unspecified: Secondary | ICD-10-CM

## 2016-02-03 DIAGNOSIS — E042 Nontoxic multinodular goiter: Secondary | ICD-10-CM

## 2016-02-03 DIAGNOSIS — R945 Abnormal results of liver function studies: Secondary | ICD-10-CM

## 2016-02-03 DIAGNOSIS — I10 Essential (primary) hypertension: Secondary | ICD-10-CM | POA: Diagnosis not present

## 2016-02-03 DIAGNOSIS — E782 Mixed hyperlipidemia: Secondary | ICD-10-CM

## 2016-02-03 DIAGNOSIS — R7989 Other specified abnormal findings of blood chemistry: Secondary | ICD-10-CM

## 2016-02-03 DIAGNOSIS — I8390 Asymptomatic varicose veins of unspecified lower extremity: Secondary | ICD-10-CM

## 2016-02-03 DIAGNOSIS — I8393 Asymptomatic varicose veins of bilateral lower extremities: Secondary | ICD-10-CM

## 2016-02-03 DIAGNOSIS — E538 Deficiency of other specified B group vitamins: Secondary | ICD-10-CM

## 2016-02-03 MED ORDER — EZETIMIBE 10 MG PO TABS: 10.0000 mg | ORAL_TABLET | Freq: Every day | ORAL | 11 refills | Status: DC

## 2016-02-03 NOTE — Progress Notes (Signed)
The patient is here for annual wellness exam and preventative care.  She reports multiple health issues.  Earlier  she saw Candis Musa, LPN for medicare wellness. Note  reviewed in detail when completed. Flu vaccine - pt declined PCV13 - pt declined Colonoscopy - pt will discuss with PCP at CPE.    Elevated Cholesterol:  LDL far from goal < 130 on no medication.  SE to statins, multiple in past, tried red yeast rice, ? SE. Lab Results  Component Value Date   CHOL 305 (H) 01/26/2016   HDL 31.10 (L) 01/26/2016   LDLCALC 197 (H) 10/09/2014   LDLDIRECT 226.0 01/26/2016   TRIG 236.0 (H) 01/26/2016   CHOLHDL 10 01/26/2016  Using medications without problems: Muscle aches:  Diet compliance: poor Exercise: none Other complaints:   Hypertension:    On propranolol ER  for essential tremor BP Readings from Last 3 Encounters:  02/03/16 (!) 117/54  01/23/16 112/64  11/04/15 134/60   Using medication without problems or lightheadedness:  None Chest pain with exertion:None Edema:None Short of breath:None Average home BPs: Other issues:  Prediabetes: she has been drinking soda lately.  She feels her tremor stable on propranolol ER, Artane and clonazepam.   Hxof fatty liver. LFTS slightly elevated today.  Social History /Family History/Past Medical History reviewed and updated if needed.   Review of Systems  Constitutional: Negative for fever and fatigue.  HENT: Negative for ear pain.   Eyes: Negative for pain.  Respiratory: Negative for chest tightness and shortness of breath.   Cardiovascular: Negative for chest pain, palpitations and leg swelling.  Gastrointestinal: Negative for abdominal pain.  Genitourinary: Negative for dysuria.       Objective:   Physical Exam  Constitutional: Vital signs are normal. She appears well-developed and well-nourished. She is cooperative.  Non-toxic appearance. She does not appear ill. No distress.  HENT:  Head:  Normocephalic.  Right Ear: Hearing, tympanic membrane, external ear and ear canal normal.  Left Ear: Hearing, tympanic membrane, external ear and ear canal normal.  Nose: Nose normal.  Eyes: Conjunctivae, EOM and lids are normal. Pupils are equal, round, and reactive to light. Lids are everted and swept, no foreign bodies found.  Neck: Trachea normal and normal range of motion. Neck supple. Carotid bruit is not present. No thyroid mass and no thyromegaly present.  Cardiovascular: Normal rate, regular rhythm, S1 normal, S2 normal, normal heart sounds and intact distal pulses.  Exam reveals no gallop.   No murmur heard. Pulmonary/Chest: Effort normal and breath sounds normal. No respiratory distress. She has no wheezes. She has no rhonchi. She has no rales.  Abdominal: Soft. Normal appearance and bowel sounds are normal. She exhibits no distension, no fluid wave, no abdominal bruit and no mass. There is no hepatosplenomegaly. There is no tenderness. There is no rebound, no guarding and no CVA tenderness. No hernia.  Genitourinary: not examined Lymphadenopathy:    She has no cervical adenopathy.    She has no axillary adenopathy.  Neurological: She is alert. She has normal strength. No cranial nerve deficit or sensory deficit.  Skin: Skin is warm, dry and intact. No rash noted.  Psychiatric: Her speech is normal and behavior is normal. Judgment normal. Her mood appears not anxious. Cognition and memory are normal. She does not exhibit a depressed mood.          Assessment & Plan:  The patient's preventative maintenance and recommended screening tests for an annual wellness exam were reviewed in  full today. Brought up to date unless services declined.  Counselled on the importance of diet, exercise, and its role in overall health and mortality. The patient's FH and SH was reviewed, including their home life, tobacco status, and drug and alcohol status.   No pap/DVE given total  Hysterectomy.  Former smoker, remotely. <25 pack year history Colon: last 2006, tics, repeat in 10 years,  [plan cologuard. Bone density: nml 2016 Mammogram: nml 2016.Marland Kitchen Check every other year.  No smoker

## 2016-02-03 NOTE — Assessment & Plan Note (Signed)
Well controlled. Continue current medication.  

## 2016-02-03 NOTE — Assessment & Plan Note (Signed)
Stable in nml range.

## 2016-02-03 NOTE — Assessment & Plan Note (Signed)
Replete

## 2016-02-03 NOTE — Progress Notes (Signed)
Pre visit review using our clinic review tool, if applicable. No additional management support is needed unless otherwise documented below in the visit note. 

## 2016-02-03 NOTE — Assessment & Plan Note (Signed)
Likely cause of right leg swelling after foot injury with venous insuffiencey.

## 2016-02-03 NOTE — Assessment & Plan Note (Signed)
Neg hep panel in past, US showed fatty liver. Had improved but now back up with poor lifestyle. Work on low fat low chol diet and re-eval in 3 months.

## 2016-02-03 NOTE — Assessment & Plan Note (Signed)
Eval at next lab draw.

## 2016-02-03 NOTE — Patient Instructions (Addendum)
Work on low cholesterol, low fat diet. Decrease cheese.. Change to low fat mozarella cheese.  Increase exercise to 3-5 times a week.  Start zetia. Stop soda. Acidic Start vit D supplement daily as able. Return cologuard. Trial of prilosec 2 tabs 20 mg daily for heartburn and difficulty swallowing.. If not improving in 2 weeks call for further recs.

## 2016-02-03 NOTE — Assessment & Plan Note (Signed)
SE to statin in past multiple. Will try trial of zetia . Return for chol recheck in 3 month.

## 2016-02-05 ENCOUNTER — Ambulatory Visit: Payer: PPO | Admitting: Neurology

## 2016-02-18 DIAGNOSIS — Z1212 Encounter for screening for malignant neoplasm of rectum: Secondary | ICD-10-CM | POA: Diagnosis not present

## 2016-02-18 DIAGNOSIS — Z1211 Encounter for screening for malignant neoplasm of colon: Secondary | ICD-10-CM | POA: Diagnosis not present

## 2016-02-18 LAB — COLOGUARD: COLOGUARD: NEGATIVE

## 2016-03-01 ENCOUNTER — Encounter: Payer: Self-pay | Admitting: Family Medicine

## 2016-03-07 IMAGING — US US SOFT TISSUE HEAD/NECK
1 series · 13 of 25 positions shown · non-contrast
Comparison: MR [facility] W/O CM dated 08/15/2013

CLINICAL DATA: Thyroid lesion noted on MRI

EXAM:
THYROID ULTRASOUND
TECHNIQUE: Ultrasound examination of the thyroid gland and adjacent soft
tissues was performed.

[Series 1: us soft tissue head/neck · 0.09mm/px · 13 of 65 slices shown]
[im 1/65]
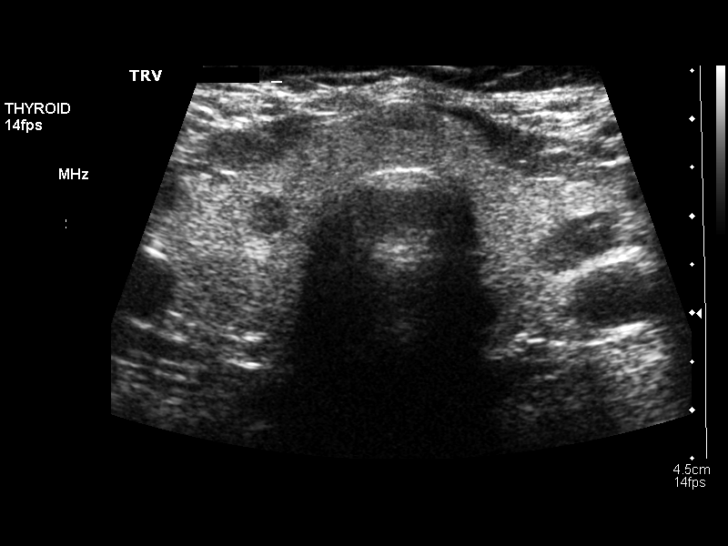
[im 6/65]
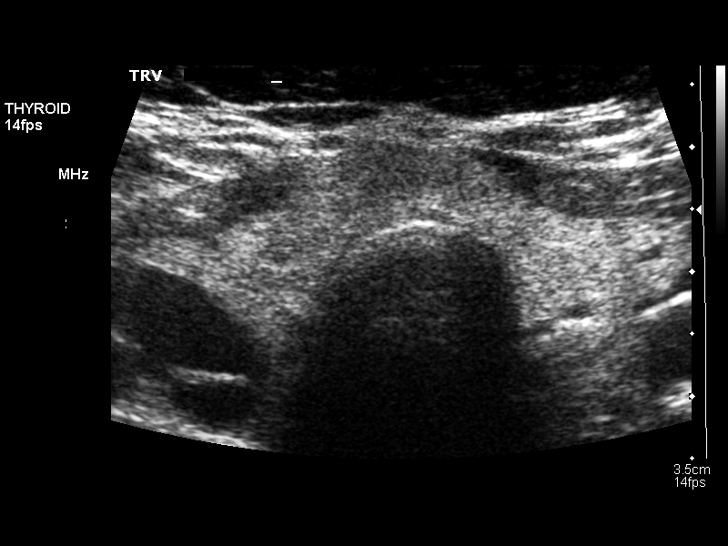
[im 11/65]
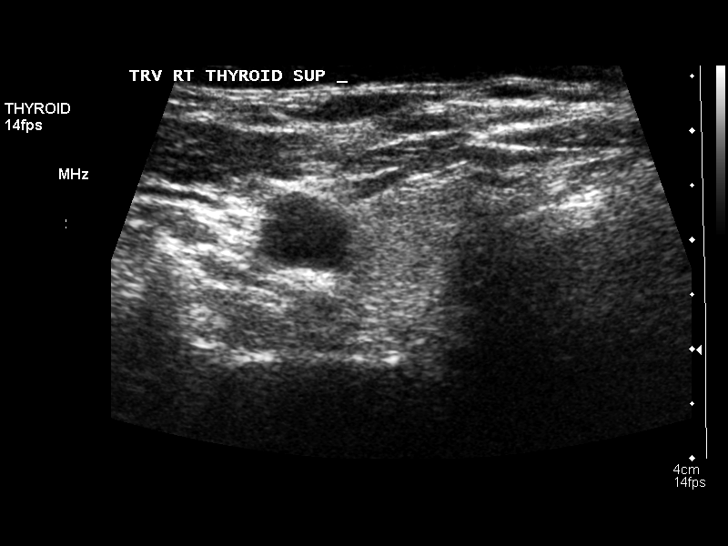
[im 17/65]
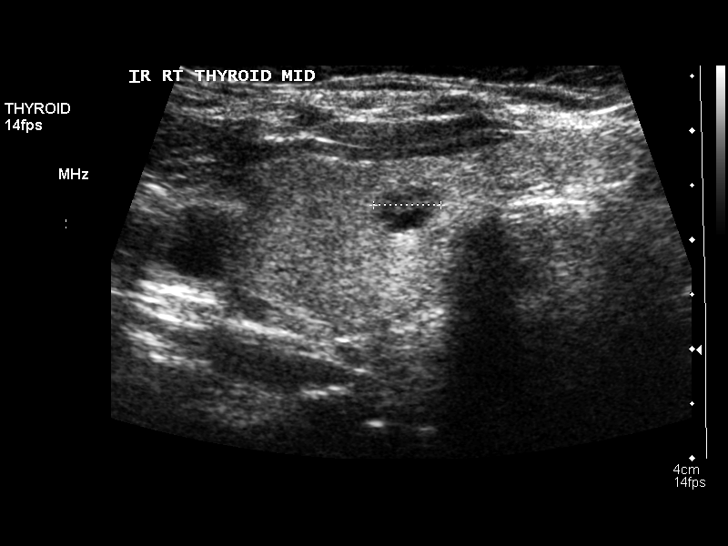
[im 22/65]
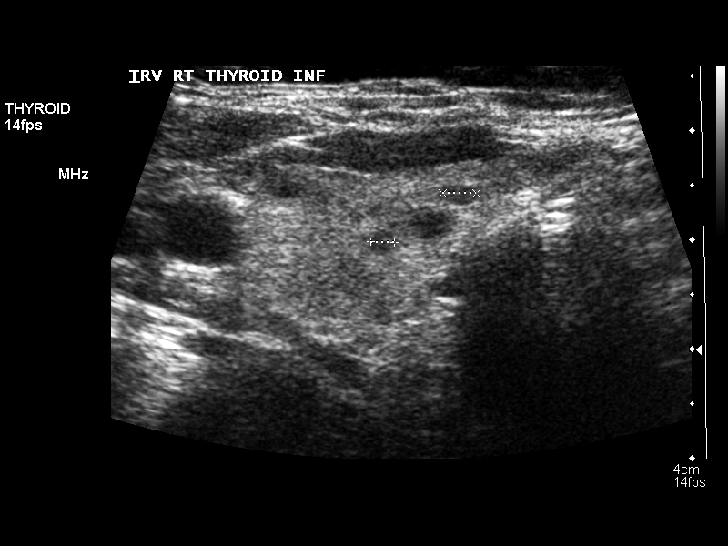
[im 27/65]
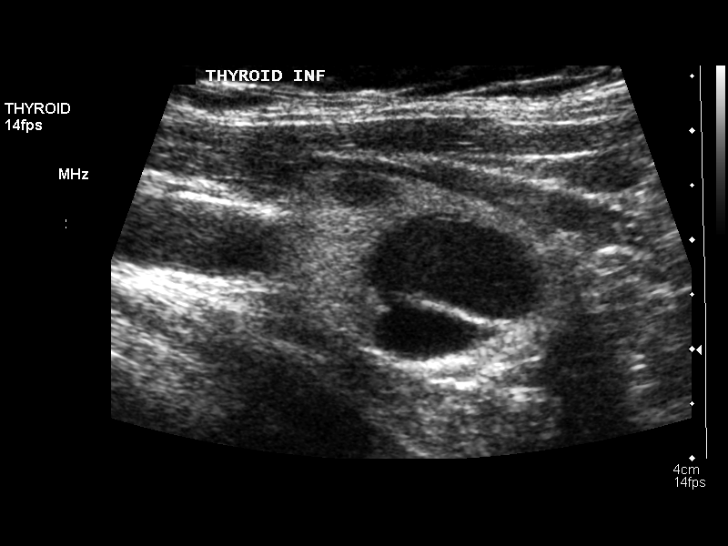
[im 33/65]
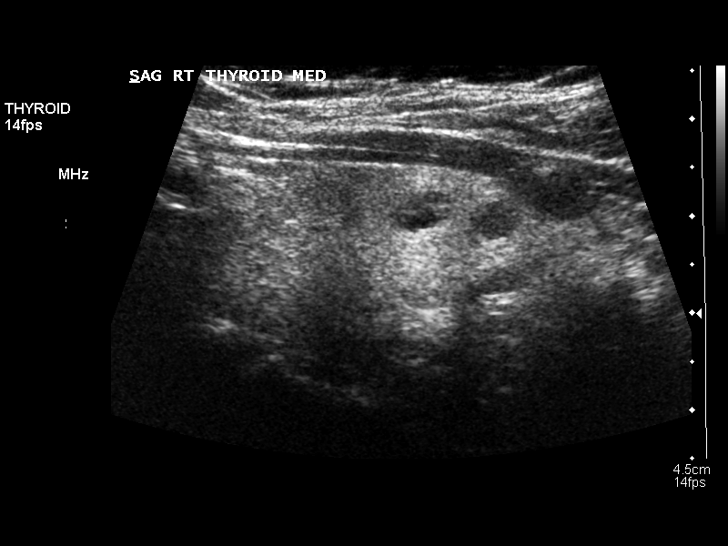
[im 38/65]
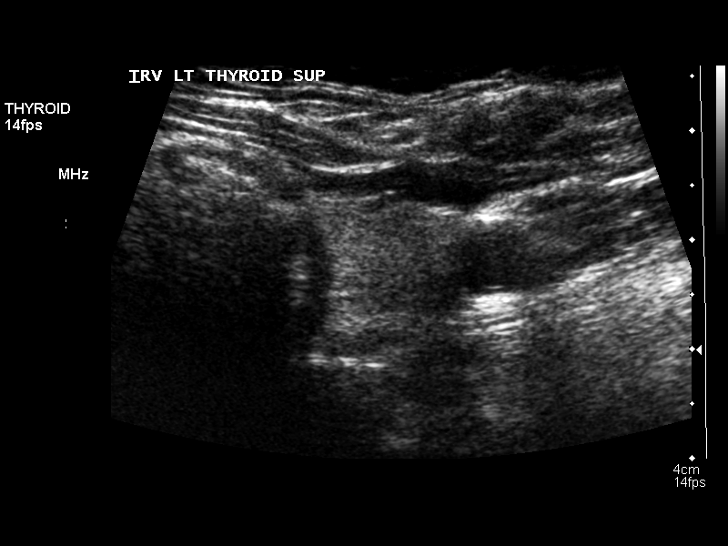
[im 43/65]
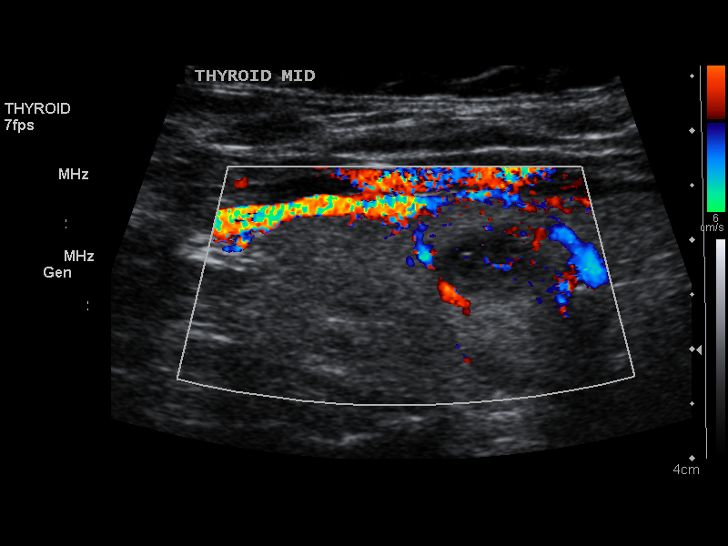
[im 49/65]
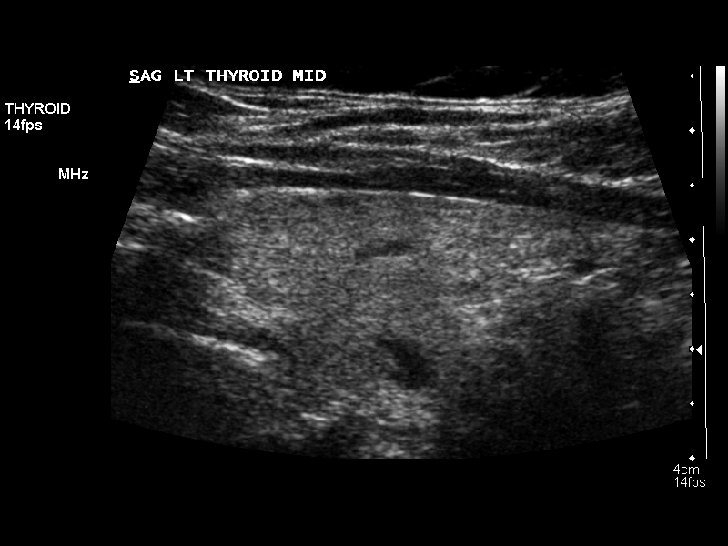
[im 54/65]
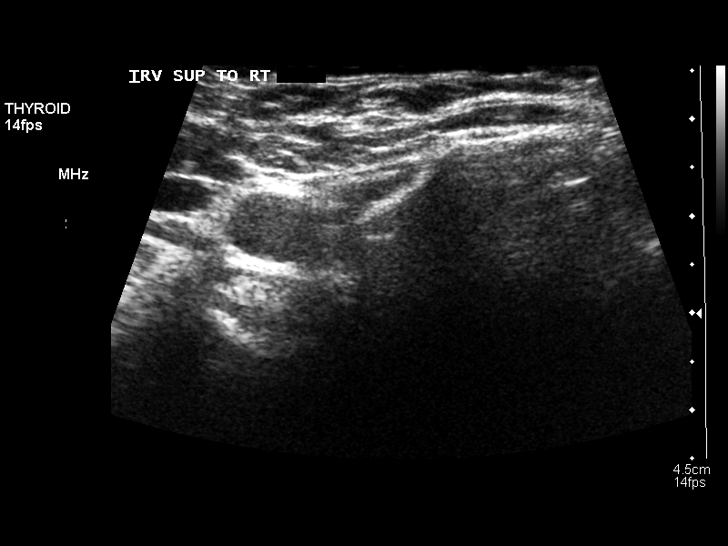
[im 59/65]
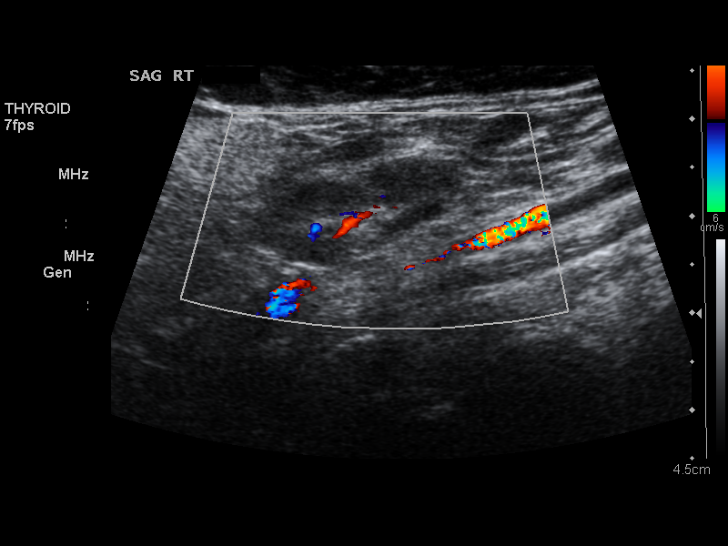
[im 65/65]
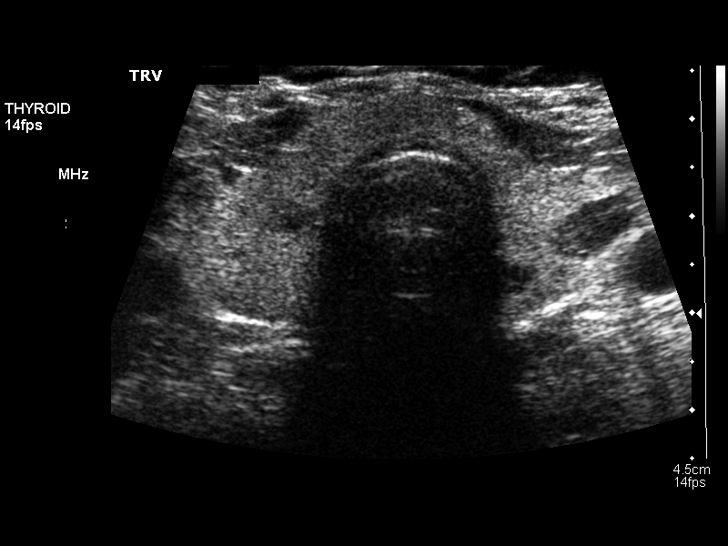

[13 of 25 positions shown; findings below may reference images not displayed]

FINDINGS: Right thyroid lobe

Measurements: 5.0 x 2.0 x 1.7 cm. Multiple right-sided nodules are
present. The largest is a complex cystic nodule with a thick
septation measuring 1.4 x 1.2 x 1.7 cm smaller solid nodules are
present. The largest demonstrates a maximal diameter of 1.1 cm.

Left thyroid lobe

Measurements: 4.3 x 1.7 x 1.7 cm. 1.1 cm left interpolar region
nodule. Other smaller nodules are noted.

Isthmus

Thickness: 6 mm in thickness.  No nodules visualized.

Lymphadenopathy

None visualized.
IMPRESSION: Bilateral complex and solid nodules as described. Findings do not
meet current SRU consensus criteria for biopsy. Follow-up by
clinical exam is recommended. If patient has known risk factors for
thyroid carcinoma, consider follow-up ultrasound in 12 months. If
patient is clinically hyperthyroid, consider nuclear medicine
thyroid uptake and scan.Reference: Management of Thyroid Nodules
Detected at US: Society of Radiologists in Ultrasound Consensus

## 2016-03-09 ENCOUNTER — Encounter: Payer: Self-pay | Admitting: Family Medicine

## 2016-03-10 ENCOUNTER — Other Ambulatory Visit: Payer: PPO

## 2016-03-12 ENCOUNTER — Ambulatory Visit: Payer: PPO | Admitting: Neurology

## 2016-03-16 ENCOUNTER — Encounter: Payer: Self-pay | Admitting: Internal Medicine

## 2016-03-16 ENCOUNTER — Ambulatory Visit (INDEPENDENT_AMBULATORY_CARE_PROVIDER_SITE_OTHER): Payer: PPO | Admitting: Internal Medicine

## 2016-03-16 VITALS — BP 124/70 | HR 69 | Temp 98.6°F | Wt 152.8 lb

## 2016-03-16 DIAGNOSIS — B9789 Other viral agents as the cause of diseases classified elsewhere: Secondary | ICD-10-CM

## 2016-03-16 DIAGNOSIS — J069 Acute upper respiratory infection, unspecified: Secondary | ICD-10-CM

## 2016-03-16 DIAGNOSIS — R6883 Chills (without fever): Secondary | ICD-10-CM

## 2016-03-16 NOTE — Patient Instructions (Signed)
Upper Respiratory Infection, Adult Most upper respiratory infections (URIs) are a viral infection of the air passages leading to the lungs. A URI affects the nose, throat, and upper air passages. The most common type of URI is nasopharyngitis and is typically referred to as "the common cold." URIs run their course and usually go away on their own. Most of the time, a URI does not require medical attention, but sometimes a bacterial infection in the upper airways can follow a viral infection. This is called a secondary infection. Sinus and middle ear infections are common types of secondary upper respiratory infections. Bacterial pneumonia can also complicate a URI. A URI can worsen asthma and chronic obstructive pulmonary disease (COPD). Sometimes, these complications can require emergency medical care and may be life threatening.  CAUSES Almost all URIs are caused by viruses. A virus is a type of germ and can spread from one person to another.  RISKS FACTORS You may be at risk for a URI if:   You smoke.   You have chronic heart or lung disease.  You have a weakened defense (immune) system.   You are very young or very old.   You have nasal allergies or asthma.  You work in crowded or poorly ventilated areas.  You work in health care facilities or schools. SIGNS AND SYMPTOMS  Symptoms typically develop 2-3 days after you come in contact with a cold virus. Most viral URIs last 7-10 days. However, viral URIs from the influenza virus (flu virus) can last 14-18 days and are typically more severe. Symptoms may include:   Runny or stuffy (congested) nose.   Sneezing.   Cough.   Sore throat.   Headache.   Fatigue.   Fever.   Loss of appetite.   Pain in your forehead, behind your eyes, and over your cheekbones (sinus pain).  Muscle aches.  DIAGNOSIS  Your health care provider may diagnose a URI by:  Physical exam.  Tests to check that your symptoms are not due to  another condition such as:  Strep throat.  Sinusitis.  Pneumonia.  Asthma. TREATMENT  A URI goes away on its own with time. It cannot be cured with medicines, but medicines may be prescribed or recommended to relieve symptoms. Medicines may help:  Reduce your fever.  Reduce your cough.  Relieve nasal congestion. HOME CARE INSTRUCTIONS   Take medicines only as directed by your health care provider.   Gargle warm saltwater or take cough drops to comfort your throat as directed by your health care provider.  Use a warm mist humidifier or inhale steam from a shower to increase air moisture. This may make it easier to breathe.  Drink enough fluid to keep your urine clear or pale yellow.   Eat soups and other clear broths and maintain good nutrition.   Rest as needed.   Return to work when your temperature has returned to normal or as your health care provider advises. You may need to stay home longer to avoid infecting others. You can also use a face mask and careful hand washing to prevent spread of the virus.  Increase the usage of your inhaler if you have asthma.   Do not use any tobacco products, including cigarettes, chewing tobacco, or electronic cigarettes. If you need help quitting, ask your health care provider. PREVENTION  The best way to protect yourself from getting a cold is to practice good hygiene.   Avoid oral or hand contact with people with cold   symptoms.   Wash your hands often if contact occurs.  There is no clear evidence that vitamin C, vitamin E, echinacea, or exercise reduces the chance of developing a cold. However, it is always recommended to get plenty of rest, exercise, and practice good nutrition.  SEEK MEDICAL CARE IF:   You are getting worse rather than better.   Your symptoms are not controlled by medicine.   You have chills.  You have worsening shortness of breath.  You have brown or red mucus.  You have yellow or brown nasal  discharge.  You have pain in your face, especially when you bend forward.  You have a fever.  You have swollen neck glands.  You have pain while swallowing.  You have white areas in the back of your throat. SEEK IMMEDIATE MEDICAL CARE IF:   You have severe or persistent:  Headache.  Ear pain.  Sinus pain.  Chest pain.  You have chronic lung disease and any of the following:  Wheezing.  Prolonged cough.  Coughing up blood.  A change in your usual mucus.  You have a stiff neck.  You have changes in your:  Vision.  Hearing.  Thinking.  Mood. MAKE SURE YOU:   Understand these instructions.  Will watch your condition.  Will get help right away if you are not doing well or get worse.   This information is not intended to replace advice given to you by your health care provider. Make sure you discuss any questions you have with your health care provider.   Document Released: 10/27/2000 Document Revised: 09/17/2014 Document Reviewed: 08/08/2013 Elsevier Interactive Patient Education 2016 [employer]  

## 2016-03-16 NOTE — Addendum Note (Signed)
Addended by: Lurlean Nanny on: 03/16/2016 04:36 PM   Modules accepted: Orders

## 2016-03-16 NOTE — Progress Notes (Signed)
HPI  Pt presents to the clinic today with c/o runny nose, post nasal drip, sore throat and cough. This started 3-4 days ago. She is blowing clear mucous out of her nose. She denies difficulty swallowing. The cough is nonproductive. She has run fevers up to 101.7, had chills and body aches. She has not taken anything OTC for this. She has a history of seasonal allergies and does take Singulair as prescribed. She has had sick contacts.  Review of Systems      Past Medical History:  Diagnosis Date  . Allergic rhinitis, cause unspecified   . Dermatophytosis of scalp and beard   . Diverticulosis   . Essential and other specified forms of tremor   . HTN (hypertension)   . Irritable bowel syndrome   . Lumbago    bulging disk per MRI   . Mixed hyperlipidemia   . Other acute reactions to stress   . Other diseases of lung, not elsewhere classified    solitary pulm. nodule(left)  . Unspecified asthma(493.90)   . Unspecified vitamin D deficiency     Family History  Problem Relation Age of Onset  . Stroke Mother   . Tremor Mother   . Diabetes Mother   . Heart failure Mother   . Other Father     Trigeminal neuralgia  . Diabetes Sister   . Coronary artery disease Sister   . Irritable bowel syndrome Sister   . Tremor Sister   . Diabetes Brother     x 3  . Coronary artery disease Brother     x 3  . Irritable bowel syndrome Brother     x 3  . Tremor Brother     x 3  . Breast cancer      Aunts and cousin  . Colon cancer Maternal Grandfather     Social History   Social History  . Marital status: Divorced    Spouse name: N/A  . Number of children: 1  . Years of education: N/A   Occupational History  . retired Other    [employer]  .  Retired   Social History Main Topics  . Smoking status: Former Smoker    Quit date: 05/17/1980  . Smokeless tobacco: Never Used  . Alcohol use No  . Drug use: No  . Sexual activity: No   Other Topics Concern  . 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 Codeine     REACTION: vomiting  . Topamax [Topiramate] Other (See Comments)    Hair loss     Constitutional: Positive fatigue and fever. Denies headache, abrupt weight changes.  HEENT:  Positive runny nose, sore throat. Denies eye redness, eye pain, pressure behind the eyes, facial pain, nasal congestion, ear pain, ringing in the ears, wax buildup, or bloody nose. Respiratory: Positive cough. Denies difficulty breathing or shortness of breath.  Cardiovascular: Denies chest pain, chest tightness, palpitations or swelling in the hands or feet.   No other specific complaints in a complete review of systems (except as listed in HPI above).  Objective:   BP 124/70   Pulse 69   Temp 98.6 F (37 C) (Oral)   Wt 152  lb 12 oz (69.3 kg)   SpO2 99%   BMI 25.42 kg/m  Wt Readings from Last 3 Encounters:  03/16/16 152 lb 12 oz (69.3 kg)  02/03/16 158 lb 4 oz (71.8 kg)  01/23/16 157 lb 12 oz (71.6 kg)     General: Appears her stated age, in NAD. HEENT: Head: normal shape and size, no sinus tenderness noted; Eyes: sclera white, no icterus, conjunctiva pink; Ears: Tm's gray and intact, normal light reflex; Nose: mucosa pink and moist, septum midline; Throat/Mouth: + PND. Teeth present, mucosa pink and moist, no exudate noted, no lesions or ulcerations noted.  Neck: No cervical lymphadenopathy.  Cardiovascular: Normal rate and rhythm. S1,S2 noted.  No murmur, rubs or gallops noted.  Pulmonary/Chest: Normal effort and positive vesicular breath sounds. No respiratory distress. No wheezes, rales or ronchi noted.      Assessment & Plan:   Viral Upper Respiratory Infection with Cough:  Rapid flu: negative Get some rest and drink plenty of water Do salt water gargles for the sore throat Start  Flonase OTC Delsym as needed for cough Ibuprofen for fever  RTC as needed or if symptoms persist.   Webb Silversmith, NP

## 2016-03-19 ENCOUNTER — Encounter: Payer: PPO | Admitting: Family Medicine

## 2016-03-23 NOTE — Progress Notes (Deleted)
Subjective:    Michelle Donovan was seen in consultation in the movement disorder clinic at the request of Eliezer Lofts, MD.  The evaluation is for tremor.  Pt has previously seen Dr. Leta Baptist and notes from 2012 were reviewed.  Pt reports that this was just a one time consultation in 2012.   The patient is a 70 y.o. right handed female with a history of tremor.   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  She states that it is in both hands but R is worse than the L (but she uses the right more). There is a family hx of tremor in her mother and brother.  She may use xanax very if going out (less than one time per month) to suppress tremor.    Affected by caffeine:  yes(drinks 8 oz coffee per da) Affected by alcohol:  unknown Affected by stress:  yes Affected by fatigue:  no Spills soup if on spoon:  yes (uses weighted utensils at home for this) Spills glass of liquid if full:  yes Affects ADL's (tying shoes, brushing teeth, etc):  no except trouble with eyeliner  11/13/13 update:  The patient returns today for follow up.  She was restarted on topamax last visit.  She was doing markedly better when it was first started but she has recently had some thyroid issues (multinodular goiter), pink eye, bilateral frozen shoulders and URI and tremor got worse during this.  She has been worked up for elevated liver enzymes but then repeat enzymes were normal and hepatitis panel was normal.  She had some paresthesias with the topamax on initiation.  She has lost some weight; no longer able to drink her coke as it tastes poorly with the topamax.    02/14/14 update:  Pt returns today for f/u.  Noted hair loss and called here and I d/c topamax 2 days ago.   Originally thought that hair loss due to highlights had put in hair over the summer but then realized that hair loss continued.   Pt frustrated as knew that topamax worked for tremor.   Has noted chin tremor.  Restarted back to work.    05/20/14 update:  Pt has a hx of ET.  Tried to d/c the topamax because of hair loss and start neurontin but tremor was not well controlled on neurontin alone and she really wanted wanted to go back on the topamax, knowing the risks.  She states that it isn't really falling out like it was but it isn't coming back like she hoped either.  She states that tremor isn't well controlled either.  She states that she wants to stay on the gabapentin because it is helping the frozen shoulder.  She is feeling depressed because of the tremor.  Asks if her thyroid nodules could be causing the tremor.  Her mother had a goiter and also had tremor.   02/18/15 update:  The patient presents today for follow-up.  I have not seen her since January, 2016.  She ended up wanting to increase the dose of Topamax, which she did to a total of 200 mg per day.  She sent me an email recently stating that tremor was less well controlled and she was having more hair loss.  She wanted to consider other options.  She admits that stress has increased greatly; her daughter and grandchild have moved back into her home.  She reports  that her daughter makes things very stressful for her.   She is noting tremor with eating, drinking, putting on makeup, writing.   She was on gabapentin for her shoulder pain but she has d/c it now.  She is on propranolol LA 120 mg daily for blood pressure.  We have talked about Artane in the past, but she has not wanted to try that.  She has tried only 1 pill of primidone in the past and it sounds like she had first dose effect and has not wanted to retry that.    05/02/15 update:  The patient is following up today regarding her essential tremor.  She is on Inderal LA, 120 mg daily.  Last visit, we cautiously start a low-dose clonazepam 0.5 mg, 1/2 tablet in the morning and half tablet at night. She states that the medication may be helping but not enough.  Fortunately,  it has not made her sleepy.   Her Topamax was discontinued because of side effects of hair loss. Still very stressed over living situation with daughter/grandson living in home.  Doesn't know what she will be doing for Christmas if anything and seems sad about that.  Her daughter is gone right now and her side of the family is going to [place] for christmas.  08/01/15 update:  The patient is following up today regarding her essential tremor.  She is on Inderal LA, 120 mg daily.  Last visit, we cautiously increase her clonazepam 0.5 mg, so that she is taking 1 tablet in the morning and half a tablet at night.  Unfortunately, despite my urging, she has refused to try primidone, because she had a first dose effect in the past when she took one tablet.  She states today that tremor has not been well controlled; she has occasional good days but they are few and far between.  11/04/15 update:  The patient is following up today regarding her essential tremor.  She is on Inderal LA which was increased last visit to 160 mg daily.  She went down to 120 mg daily because she was sleepy and she didn't think that it was helping much and asked me in an email about focused u/s.  I told her that insurance doesn't pay for this right now even though it was FDA approved.  She remains on clonazepam 0.5 mg, so that she is taking 1 tablet in the morning and half a tablet at night.  Unfortunately, despite my urging, she has refused to try primidone, because she had a first dose effect in the past when she took one tablet.  She states today that she continues to have tremor.  She is on prednisone currently because of "inflammation" in the top of the L foot.  She doesn't think that prednisone has changed tremor.   03/24/16 update:  The patient follows up today.  She is on Inderal LA, 120.  Last visit, she wanted to start Artane.  She had no side effects with the Artane, but emailed me and kept stating that she thought it was getting worse.   It turns out that she had stopped the clonazepam when she started the trihexyphenidyl, and it was my intention for her to continue the clonazepam and not stop it.  I did offer her another opinion at a tertiary care center, but she declined that.  She is now back on a combination of both Artane, 2 mg 3 times a day and clonazepam, 0.5 mg, one tablet in the morning  and half a tablet at night.  This is in addition to the Inderal LA.  Current/Previously tried tremor medications: topamax;  Metoprolol - felt drained ; primidone (50 mg - 1/2 at night but got first dose effect and stopped it); meclizine (? Why); on propranolol now - ? Helping now; gabapentin; Artane (no help)  Current medications that may exacerbate tremor:  n/a  Outside reports reviewed: historical medical records and referral letter/letters.  Allergies  Allergen Reactions  . Codeine     REACTION: vomiting  . Topamax [Topiramate] Other (See Comments)    Hair loss    Current Outpatient Prescriptions on File Prior to Visit  Medication Sig Dispense Refill  . cetirizine (ZYRTEC) 10 MG tablet Take 10 mg by mouth daily.    . clonazePAM (KLONOPIN) 0.5 MG tablet Take one in the morning, half in the evening 45 tablet 5  . ezetimibe (ZETIA) 10 MG tablet Take 1 tablet (10 mg total) by mouth daily. 30 tablet 11  . montelukast (SINGULAIR) 10 MG tablet Take 1 tablet (10 mg total) by mouth at bedtime. 90 tablet 3  . propranolol ER (INDERAL LA) 120 MG 24 hr capsule TAKE 1 CAPSULE (120 MG TOTAL) BY MOUTH DAILY.  1  . triamcinolone cream (KENALOG) 0.1 % Apply 1 application topically 2 (two) times daily as needed.  1  . trihexyphenidyl (ARTANE) 2 MG tablet Take 1 tablet (2 mg total) by mouth 2 (two) times daily with a meal. 60 tablet 3   No current facility-administered medications on file prior to visit.     Past Medical History:  Diagnosis Date  . Allergic rhinitis, cause unspecified   . Dermatophytosis of scalp and beard   . Diverticulosis    . Essential and other specified forms of tremor   . HTN (hypertension)   . Irritable bowel syndrome   . Lumbago    bulging disk per MRI   . Mixed hyperlipidemia   . Other acute reactions to stress   . Other diseases of lung, not elsewhere classified    solitary pulm. nodule(left)  . Unspecified asthma(493.90)   . Unspecified vitamin D deficiency     Past Surgical History:  Procedure Laterality Date  . LAPAROSCOPIC TOTAL HYSTERECTOMY  2002  . LAPAROSCOPIC UNILATERAL SALPINGO OOPHERECTOMY    . TONSILLECTOMY  1967/68  . TUBAL LIGATION  1986    Social History   Social History  . Marital status: Divorced    Spouse name: N/A  . Number of children: 1  . Years of education: N/A   Occupational History  . retired Other    [employer]  .  Retired   Social History Main Topics  . Smoking status: Former Smoker    Quit date: 05/17/1980  . Smokeless tobacco: Never Used  . Alcohol use No  . Drug use: No  . Sexual activity: No   Other Topics Concern  . 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 Mother Deceased at age 90   CHF, Essential Tremor  . Father Deceased   old age, heart disease  . Brother Alive   diabetes  . Brother Alive   COPD, heart disease  . Brother  Alive   Benign Essential Tremor  . Sister Alive   healthy  . Daughter Alive   anxiety  . Sister   . Sister   . Sister   . Sister   . Brother   . Brother   . Brother   . Brother   .    Marland Kitchen Maternal Grandfather     Review of Systems A complete 10 system ROS was obtained and was negative apart from what is mentioned.   Objective:   VITALS:   There were no vitals filed for this visit. Wt Readings from Last 3 Encounters:  03/16/16 152 lb 12 oz (69.3 kg)  02/03/16 158 lb 4 oz (71.8 kg)  01/23/16 157 lb 12 oz (71.6 kg)     Gen:  Appears stated age and in NAD.  Flat affect today and seems depressed HEENT:  Normocephalic, atraumatic. The mucous membranes are moist. The superficial temporal arteries are without ropiness or tenderness. Cardiovascular: Regular rate and rhythm. Lungs: Clear to auscultation bilaterally. Neck: There are no carotid bruits noted bilaterally.  NEUROLOGICAL:  Orientation:  The patient is alert and oriented x 3.   Cranial nerves: There is good facial symmetry.  Speech is fluent and clear. Soft palate rises symmetrically and there is no tongue deviation. Hearing is intact to conversational tone. Tone: Tone is good throughout. Sensation: Sensation is intact to light touch throughout. Coordination:  The patient has no dysdiadichokinesia or dysmetria. Motor: Strength is 5/5 in the bilateral upper and lower extremities.  Shoulder shrug is equal bilaterally.  There is no pronator drift.  There are no fasciculations noted. Gait and Station: The patient is able to ambulate without difficulty.   MOVEMENT EXAM: Tremor:  There is mild tremor of the outstretched hands, right more than L.  This becomes moderate in nature with holding something of weight.  Archimedes spirals have deteriorated.   There is chin tremor.     Labs:  Lab Results  Component Value Date   TSH 0.862 02/08/2014     Chemistry      Component Value Date/Time   NA 138 01/26/2016 0859   K 4.2 01/26/2016 0859   CL 104 01/26/2016 0859   CO2 28 01/26/2016 0859   BUN 11 01/26/2016 0859   CREATININE 0.74 01/26/2016 0859   CREATININE 0.77 10/05/2013 1526      Component Value Date/Time   CALCIUM 9.0 01/26/2016 0859   ALKPHOS 99 01/26/2016 0859   AST 61 (H) 01/26/2016 0859   ALT 79 (H) 01/26/2016 0859   BILITOT 0.5 01/26/2016 0859        Lab Results  Component Value Date   WBC 8.6 04/08/2013   HGB 11.6 (L) 04/08/2013   HCT 34.6 (L) 04/08/2013   MCV 88.7 04/08/2013   PLT 190 04/08/2013   Lab Results   Component Value Date   TSH 0.862 02/08/2014      Assessment/Plan:   1.  Essential Tremor.  --I had a very long discussion with the patient today.  I really would like her to retry primidone, as she only tried one pill in the past but she doesn't want to do this.    -We'll continue Artane, 2 mg 3 times per day and clonazepam, 0.5 mg, one tablet in the morning and half a tablet at night.  -Continue Inderal LA, 120 mg daily.  -Could consider trokendi in the future (SE with topamax).  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  I did  tell her that this is unlikely to improve with medication therapy.  -Talked to her about surgical options, both DBS and focused u/s.  She may be DBS candidate but she doesn't want to look into that right now.   2.  I will plan on seeing her back in the next few months, sooner should new neurologic issues arise.  Much greater than 50% of this visit was spent in counseling with the patient and the family.  Total face to face time:  30 min

## 2016-03-24 ENCOUNTER — Ambulatory Visit: Payer: PPO | Admitting: Neurology

## 2016-05-22 ENCOUNTER — Encounter: Payer: Self-pay | Admitting: Internal Medicine

## 2016-05-24 ENCOUNTER — Other Ambulatory Visit: Payer: Self-pay | Admitting: Internal Medicine

## 2016-05-25 MED ORDER — VALACYCLOVIR HCL 1 G PO TABS: ORAL_TABLET | ORAL | 0 refills | Status: DC

## 2016-05-28 ENCOUNTER — Other Ambulatory Visit: Payer: Self-pay | Admitting: Neurology

## 2016-06-01 ENCOUNTER — Other Ambulatory Visit: Payer: Self-pay | Admitting: Neurology

## 2016-06-01 ENCOUNTER — Telehealth: Payer: Self-pay | Admitting: Neurology

## 2016-06-01 MED ORDER — CLONAZEPAM 0.5 MG PO TABS: ORAL_TABLET | ORAL | 1 refills | Status: DC

## 2016-06-01 NOTE — Telephone Encounter (Signed)
Patient need a refill on klonopin  she uses [facility] she is out she made appt to come back into see DR Tat patient phone number is [phone]

## 2016-06-01 NOTE — Telephone Encounter (Signed)
RX sent to last til follow up appt. Patient made aware.

## 2016-06-28 ENCOUNTER — Other Ambulatory Visit: Payer: Self-pay | Admitting: Family Medicine

## 2016-07-07 NOTE — Progress Notes (Signed)
Subjective:    Michelle Donovan was seen in consultation in the movement disorder clinic at the request of Michelle Lofts, MD.  The evaluation is for tremor.  Pt has previously seen Dr. Leta Donovan and notes from 2012 were reviewed.  Pt reports that this was just a one time consultation in 2012.   The patient is a 71 y.o. right handed female with a history of tremor.   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  She states that it is in both hands but R is worse than the L (but she uses the right more). There is a family hx of tremor in her mother and brother.  She may use xanax very if going out (less than one time per month) to suppress tremor.    Affected by caffeine:  yes(drinks 8 oz coffee per da) Affected by alcohol:  unknown Affected by stress:  yes Affected by fatigue:  no Spills soup if on spoon:  yes (uses weighted utensils at home for this) Spills glass of liquid if full:  yes Affects ADL's (tying shoes, brushing teeth, etc):  no except trouble with eyeliner  11/13/13 update:  The patient returns today for follow up.  She was restarted on topamax last visit.  She was doing markedly better when it was first started but she has recently had some thyroid issues (multinodular goiter), pink eye, bilateral frozen shoulders and URI and tremor got worse during this.  She has been worked up for elevated liver enzymes but then repeat enzymes were normal and hepatitis panel was normal.  She had some paresthesias with the topamax on initiation.  She has lost some weight; no longer able to drink her coke as it tastes poorly with the topamax.    02/14/14 update:  Pt returns today for f/u.  Noted hair loss and called here and I d/c topamax 2 days ago.   Originally thought that hair loss due to highlights had put in hair over the summer but then realized that hair loss continued.   Pt frustrated as knew that topamax worked for tremor.   Has noted chin tremor.  Restarted back to work.    05/20/14 update:  Pt has a hx of ET.  Tried to d/c the topamax because of hair loss and start neurontin but tremor was not well controlled on neurontin alone and she really wanted wanted to go back on the topamax, knowing the risks.  She states that it isn't really falling out like it was but it isn't coming back like she hoped either.  She states that tremor isn't well controlled either.  She states that she wants to stay on the gabapentin because it is helping the frozen shoulder.  She is feeling depressed because of the tremor.  Asks if her thyroid nodules could be causing the tremor.  Her mother had a goiter and also had tremor.   02/18/15 update:  The patient presents today for follow-up.  I have not seen her since January, 2016.  She ended up wanting to increase the dose of Topamax, which she did to a total of 200 mg per day.  She sent me an email recently stating that tremor was less well controlled and she was having more hair loss.  She wanted to consider other options.  She admits that stress has increased greatly; her daughter and grandchild have moved back into her home.  She reports  that her daughter makes things very stressful for her.   She is noting tremor with eating, drinking, putting on makeup, writing.   She was on gabapentin for her shoulder pain but she has d/c it now.  She is on propranolol LA 120 mg daily for blood pressure.  We have talked about Artane in the past, but she has not wanted to try that.  She has tried only 1 pill of primidone in the past and it sounds like she had first dose effect and has not wanted to retry that.    05/02/15 update:  The patient is following up today regarding her essential tremor.  She is on Inderal LA, 120 mg daily.  Last visit, we cautiously start a low-dose clonazepam 0.5 mg, 1/2 tablet in the morning and half tablet at night. She states that the medication may be helping but not enough.  Fortunately,  it has not made her sleepy.   Her Topamax was discontinued because of side effects of hair loss. Still very stressed over living situation with daughter/grandson living in home.  Doesn't know what she will be doing for Christmas if anything and seems sad about that.  Her daughter is gone right now and her side of the family is going to [place] for christmas.  08/01/15 update:  The patient is following up today regarding her essential tremor.  She is on Inderal LA, 120 mg daily.  Last visit, we cautiously increase her clonazepam 0.5 mg, so that she is taking 1 tablet in the morning and half a tablet at night.  Unfortunately, despite my urging, she has refused to try primidone, because she had a first dose effect in the past when she took one tablet.  She states today that tremor has not been well controlled; she has occasional good days but they are few and far between.  11/04/15 update:  The patient is following up today regarding her essential tremor.  She is on Inderal LA which was increased last visit to 160 mg daily.  She went down to 120 mg daily because she was sleepy and she didn't think that it was helping much and asked me in an email about focused u/s.  I told her that insurance doesn't pay for this right now even though it was FDA approved.  She remains on clonazepam 0.5 mg, so that she is taking 1 tablet in the morning and half a tablet at night.  Unfortunately, despite my urging, she has refused to try primidone, because she had a first dose effect in the past when she took one tablet.  She states today that she continues to have tremor.  She is on prednisone currently because of "inflammation" in the top of the L foot.  She doesn't think that prednisone has changed tremor.   07/08/16 update:  Patient follows up today.  I have not seen her in about 8 months.  I did review her records since last visit.  I cautiously tried her on Artane last visit.  She emailed me in July to state that she thought that  the medication made her tremor worse.  It turns out that when she started the Artane, she stopped her clonazepam, which was not the intention, and is likely the reason that tremor increased.  I asked her to start the clonazepam again, 0.5 mg, one tablet in the morning and half a tablet at night.  I offered her an opinion at an academic center but she declined that.  She states that she cut back on the artane to 1/2 tablet (of the 2mg ) at night because of some dizziness and it seems to be helping some.  She is still on the Inderal LA, 120 g daily.  She denies hallucinations.  She denies falls.  She denies lightheadedness or near syncope.  Current/Previously tried tremor medications: topamax;  Metoprolol - felt drained ; primidone (50 mg - 1/2 at night but got first dose effect and stopped it); meclizine (? Why); on propranolol now - ? Helping now; gabapentin; artane  Current medications that may exacerbate tremor:  n/a  Outside reports reviewed: historical medical records and referral letter/letters.  Allergies  Allergen Reactions  . Codeine     REACTION: vomiting  . Topamax [Topiramate] Other (See Comments)    Hair loss    Current Outpatient Prescriptions on File Prior to Visit  Medication Sig Dispense Refill  . cetirizine (ZYRTEC) 10 MG tablet Take 10 mg by mouth daily.    . clonazePAM (KLONOPIN) 0.5 MG tablet Take one in the morning, half in the evening 45 tablet 1  . ezetimibe (ZETIA) 10 MG tablet Take 1 tablet (10 mg total) by mouth daily. 30 tablet 11  . montelukast (SINGULAIR) 10 MG tablet Take 1 tablet (10 mg total) by mouth at bedtime. 90 tablet 3  . propranolol ER (INDERAL LA) 120 MG 24 hr capsule TAKE 1 CAPSULE (120 MG TOTAL) BY MOUTH DAILY. 90 capsule 1  . triamcinolone cream (KENALOG) 0.1 % Apply 1 application topically 2 (two) times daily as needed.  1  . trihexyphenidyl (ARTANE) 2 MG tablet TAKE 1 TABLET (2 MG TOTAL) BY MOUTH 2 (TWO) TIMES DAILY WITH A MEAL. 60 tablet 0  .  valACYclovir (VALTREX) 1000 MG tablet Take 2 tabs every 12 hours x 1 day as needed for cold sores 20 tablet 0   No current facility-administered medications on file prior to visit.     Past Medical History:  Diagnosis Date  . Allergic rhinitis, cause unspecified   . Dermatophytosis of scalp and beard   . Diverticulosis   . Essential and other specified forms of tremor   . HTN (hypertension)   . Irritable bowel syndrome   . Lumbago    bulging disk per MRI   . Mixed hyperlipidemia   . Other acute reactions to stress   . Other diseases of lung, not elsewhere classified    solitary pulm. nodule(left)  . Unspecified asthma(493.90)   . Unspecified vitamin D deficiency     Past Surgical History:  Procedure Laterality Date  . LAPAROSCOPIC TOTAL HYSTERECTOMY  2002  . LAPAROSCOPIC UNILATERAL SALPINGO OOPHERECTOMY    . TONSILLECTOMY  1967/68  . TUBAL LIGATION  1986    Social History   Social History  . Marital status: Divorced    Spouse name: N/A  . Number of children: 1  . Years of education: N/A   Occupational History  . retired Other    [employer]  .  Retired  . part time [employer] History Main Topics  . Smoking status: Former Smoker    Quit date: 05/17/1980  . Smokeless tobacco: Never Used  . Alcohol use No  . Drug use: No  . Sexual activity: No   Other Topics Concern  . 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 Mother Deceased at age 32   CHF, Essential Tremor  . Father Deceased   old age, heart disease  . Brother Alive   diabetes  . Brother Alive   COPD, heart disease  . Brother Alive   Benign Essential Tremor  . Sister Alive   healthy  . Daughter Alive   anxiety  . Sister   . Sister   . Sister   . Sister   . Brother   . Brother   . Brother   .  Brother   .    Marland Kitchen Maternal Grandfather     Review of Systems A complete 10 system ROS was obtained and was negative apart from what is mentioned.   Objective:   VITALS:   Vitals:   07/08/16 1451  BP: 100/70  Pulse: 64  SpO2: 98%  Height: 5\' 5"  (1.651 m)   Wt Readings from Last 3 Encounters:  03/16/16 152 lb 12 oz (69.3 kg)  02/03/16 158 lb 4 oz (71.8 kg)  01/23/16 157 lb 12 oz (71.6 kg)    Gen:  Appears stated age and in NAD.  Flat affect today  HEENT:  Normocephalic, atraumatic. The mucous membranes are moist. The superficial temporal arteries are without ropiness or tenderness. Cardiovascular: Regular rate and rhythm. Lungs: Clear to auscultation bilaterally. Neck: There are no carotid bruits noted bilaterally.  NEUROLOGICAL:  Orientation:  The patient is alert and oriented x 3.   Cranial nerves: There is good facial symmetry.  Speech is fluent and clear. Soft palate rises symmetrically and there is no tongue deviation. Hearing is intact to conversational tone. Tone: Tone is good throughout. Sensation: Sensation is intact to light touch throughout. Coordination:  The patient has no dysdiadichokinesia or dysmetria. Motor: Strength is 5/5 in the bilateral upper and lower extremities.  Shoulder shrug is equal bilaterally.  There is no pronator drift.  There are no fasciculations noted. Gait and Station: The patient is able to ambulate without difficulty.   MOVEMENT EXAM: Tremor:  There is mild tremor of the outstretched hands, right more than L.  This becomes moderate in nature with holding something of weight.  Archimedes spirals have improved and I showed her past ones.     Labs:  Lab Results  Component Value Date   TSH 0.862 02/08/2014     Chemistry      Component Value Date/Time   NA 138 01/26/2016 0859   K 4.2 01/26/2016 0859   CL 104 01/26/2016 0859   CO2 28 01/26/2016 0859   BUN 11 01/26/2016 0859   CREATININE 0.74 01/26/2016 0859   CREATININE 0.77  10/05/2013 1526      Component Value Date/Time   CALCIUM 9.0 01/26/2016 0859   ALKPHOS 99 01/26/2016 0859   AST 61 (H) 01/26/2016 0859   ALT 79 (H) 01/26/2016 0859   BILITOT 0.5 01/26/2016 0859        Lab Results  Component Value Date   WBC 8.6 04/08/2013   HGB 11.6 (L) 04/08/2013   HCT 34.6 (L) 04/08/2013   MCV 88.7 04/08/2013   PLT 190 04/08/2013   Lab Results  Component Value Date   TSH 0.862 02/08/2014   Lab Results  Component Value Date   VITAMINB12 383 01/26/2016   No results found for: HGBA1C    Assessment/Plan:  1.  Essential Tremor.  --I had a very long discussion with the patient today.  I really would like her to retry primidone, as she only tried one pill in the past but she doesn't want to do this.  Talked about artane, cogentin (and anticholinergic SE).  She mentions that topamax was only thing that helped but had SE.  Talked to her about trokendi, which generally has less SE than the older topamax (both still topiramate).   She is doing somewhat better with artane 1mg  qhs, inderal LA 120 mg and klonopin 0.5 mg, 1 in the AM and 1/2 at night.  No cognitive changes at this point and no falls but will need to monitor closely.  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  I did tell her that this is unlikely to improve with medication therapy.  -Talked to her about surgical options, both DBS and focused u/s.  She may be DBS candidate but she doesn't want to look into that right now.    -need to recheck liver enzymes given they were up in September  -We will check her hemoglobin A1c as fasting glucose was somewhat elevated. 2.  Mild B12 deficiency.  -Needs to restart her B12 supplement, 1000 g daily.  3.  Depression  -Does not appear to be well controlled.  She is not suicidal or homicidal.  Will follow-up with her primary care physician in this regard.  4.  Follow-up with me in the next 5 months, sooner should new neurologic issues arise.   Much greater than 50% of this visit was spent in counseling and coordinating care.  Total face to face time:  25 min

## 2016-07-08 ENCOUNTER — Encounter: Payer: Self-pay | Admitting: Neurology

## 2016-07-08 ENCOUNTER — Other Ambulatory Visit (INDEPENDENT_AMBULATORY_CARE_PROVIDER_SITE_OTHER): Payer: PPO

## 2016-07-08 ENCOUNTER — Ambulatory Visit (INDEPENDENT_AMBULATORY_CARE_PROVIDER_SITE_OTHER): Payer: PPO | Admitting: Neurology

## 2016-07-08 VITALS — BP 100/70 | HR 64 | Ht 65.0 in

## 2016-07-08 DIAGNOSIS — R739 Hyperglycemia, unspecified: Secondary | ICD-10-CM | POA: Diagnosis not present

## 2016-07-08 DIAGNOSIS — Z5181 Encounter for therapeutic drug level monitoring: Secondary | ICD-10-CM | POA: Diagnosis not present

## 2016-07-08 DIAGNOSIS — R740 Nonspecific elevation of levels of transaminase and lactic acid dehydrogenase [LDH]: Secondary | ICD-10-CM

## 2016-07-08 DIAGNOSIS — R7401 Elevation of levels of liver transaminase levels: Secondary | ICD-10-CM

## 2016-07-08 LAB — COMPREHENSIVE METABOLIC PANEL
ALT: 113 U/L — AB (ref 0–35)
AST: 103 U/L — AB (ref 0–37)
Albumin: 3.9 g/dL (ref 3.5–5.2)
Alkaline Phosphatase: 117 U/L (ref 39–117)
BILIRUBIN TOTAL: 0.4 mg/dL (ref 0.2–1.2)
BUN: 8 mg/dL (ref 6–23)
CHLORIDE: 104 meq/L (ref 96–112)
CO2: 29 meq/L (ref 19–32)
CREATININE: 0.72 mg/dL (ref 0.40–1.20)
Calcium: 9.4 mg/dL (ref 8.4–10.5)
GFR: 85.48 mL/min (ref >60.00)
GLUCOSE: 114 mg/dL — AB (ref 70–99)
Potassium: 3.9 mEq/L (ref 3.5–5.1)
SODIUM: 138 meq/L (ref 135–145)
Total Protein: 7 g/dL (ref 6.0–8.3)

## 2016-07-08 LAB — HEMOGLOBIN A1C: Hgb A1c MFr Bld: 6.2 % (ref 4.6–6.5)

## 2016-07-08 NOTE — Patient Instructions (Signed)
Please begin taking vitamin B12 1000 mcg daily.

## 2016-07-09 ENCOUNTER — Telehealth: Payer: Self-pay | Admitting: Neurology

## 2016-07-09 ENCOUNTER — Telehealth: Payer: Self-pay | Admitting: *Deleted

## 2016-07-09 NOTE — Telephone Encounter (Signed)
Patient returned Donna's call.  Patient can be reached at [phone].

## 2016-07-09 NOTE — Telephone Encounter (Signed)
-----   Message from [place], DO sent at 07/08/2016  5:32 PM EST ----- Let pt know that liver enzymes are even higher than they were 5 months ago.  She needs PCP follow up to discuss.

## 2016-07-09 NOTE — Telephone Encounter (Signed)
Appointment scheduled with Dr. Diona Browner 07/16/2016 at 4:00 pm to discuss elevated liver enzymes per Dr. Carles Collet.

## 2016-07-09 NOTE — Telephone Encounter (Signed)
-----   Message from Jinny Sanders, MD sent at 07/09/2016 12:38 PM EST -----   ----- Message ----- From: Annamaria Helling, CMA Sent: 07/09/2016   9:38 AM To: Jinny Sanders, MD

## 2016-07-09 NOTE — Telephone Encounter (Signed)
Neg hep panel in past, US showed fatty liver in 2008 and 2014.  Call pt any new meds? Have her hold zetia and any tylenol or alcohol.  Work on low fat diet, exercise and weight loss.   Have her return in 2 weeks for lab recheck LFTs.  If persistently elevated.. Will refer her to GI for further eval.

## 2016-07-09 NOTE — Telephone Encounter (Signed)
Mychart message sent to patient.

## 2016-07-09 NOTE — Telephone Encounter (Signed)
Left message for Michelle Donovan to return my call.

## 2016-07-13 DIAGNOSIS — D045 Carcinoma in situ of skin of trunk: Secondary | ICD-10-CM | POA: Diagnosis not present

## 2016-07-13 DIAGNOSIS — L720 Epidermal cyst: Secondary | ICD-10-CM | POA: Diagnosis not present

## 2016-07-13 DIAGNOSIS — D485 Neoplasm of uncertain behavior of skin: Secondary | ICD-10-CM | POA: Diagnosis not present

## 2016-07-16 ENCOUNTER — Encounter: Payer: Self-pay | Admitting: Family Medicine

## 2016-07-16 ENCOUNTER — Ambulatory Visit (INDEPENDENT_AMBULATORY_CARE_PROVIDER_SITE_OTHER): Payer: PPO | Admitting: Family Medicine

## 2016-07-16 VITALS — BP 130/70 | HR 67 | Temp 98.2°F | Ht 65.0 in | Wt 157.0 lb

## 2016-07-16 DIAGNOSIS — K760 Fatty (change of) liver, not elsewhere classified: Secondary | ICD-10-CM | POA: Diagnosis not present

## 2016-07-16 DIAGNOSIS — R945 Abnormal results of liver function studies: Principal | ICD-10-CM

## 2016-07-16 DIAGNOSIS — R7989 Other specified abnormal findings of blood chemistry: Secondary | ICD-10-CM

## 2016-07-16 DIAGNOSIS — R748 Abnormal levels of other serum enzymes: Secondary | ICD-10-CM | POA: Diagnosis not present

## 2016-07-16 LAB — HEPATIC FUNCTION PANEL
ALT: 95 U/L — AB (ref 6–29)
AST: 108 U/L — ABNORMAL HIGH (ref 10–35)
Albumin: 3.8 g/dL (ref 3.6–5.1)
Alkaline Phosphatase: 111 U/L (ref 33–130)
BILIRUBIN DIRECT: 0.1 mg/dL (ref <=0.2)
BILIRUBIN INDIRECT: 0.3 mg/dL (ref 0.2–1.2)
Total Bilirubin: 0.4 mg/dL (ref 0.2–1.2)
Total Protein: 6.7 g/dL (ref 6.1–8.1)

## 2016-07-16 NOTE — Assessment & Plan Note (Signed)
No clear cause other than high fat diet. Pt will avoid fatty foods, fried foods and tylenol, ETOH ( not using) as well as hold zetia. Pt reports history of gallstones but asymptomatic and no clear indication that that is contributing to elevated LFTs. Will re-eval with labs and [place] for progression.  May need referral to  Gi for further evaluation and treatment.

## 2016-07-16 NOTE — Progress Notes (Signed)
Pre visit review using our clinic review tool, if applicable. No additional management support is needed unless otherwise documented below in the visit note. 

## 2016-07-16 NOTE — Progress Notes (Signed)
   Subjective:    Patient ID: Michelle Donovan, female    DOB: 1945-09-28, 71 y.o.   MRN: [id number]  HPI   71 year old female presents for discussion of abnormal labs drawn at neurologist.  LFTs now increased from AST 61 to 103 , ALT 79 to 113 Bili and albumin nml,  She has history of  elevated LFTs in past felt secondary to fatty liver Nml hep panel and [place] in 2014. Put on low chol low fat diet.  Pt no longer on zetia, randomly using about 1 time a week. Using rare tylenol. No ETOH. Has not been eating low fat diet. Eats a lot of fast food. Wt Readings from Last 3 Encounters:  07/16/16 157 lb (71.2 kg)  03/16/16 152 lb 12 oz (69.3 kg)  02/03/16 158 lb 4 oz (71.8 kg)   In last few weeks she has has occ pain in right mid back.. Felt like pulled muscle, sore mildly.  Review of Systems  Constitutional: Negative for fatigue and fever.  HENT: Negative for ear pain.   Eyes: Negative for pain.  Respiratory: Negative for chest tightness and shortness of breath.   Cardiovascular: Negative for chest pain, palpitations and leg swelling.  Gastrointestinal: Negative for abdominal pain.  Genitourinary: Negative for dysuria.       Objective:   Physical Exam  Constitutional: Vital signs are normal. She appears well-developed and well-nourished. She is cooperative.  Non-toxic appearance. She does not appear ill. No distress.  Central obesity and small ventral weakening/hernia centrally  HENT:  Head: Normocephalic.  Right Ear: Hearing, tympanic membrane, external ear and ear canal normal. Tympanic membrane is not erythematous, not retracted and not bulging.  Left Ear: Hearing, tympanic membrane, external ear and ear canal normal. Tympanic membrane is not erythematous, not retracted and not bulging.  Nose: No mucosal edema or rhinorrhea. Right sinus exhibits no maxillary sinus tenderness and no frontal sinus tenderness. Left sinus exhibits no maxillary sinus tenderness and no frontal sinus  tenderness.  Mouth/Throat: Uvula is midline, oropharynx is clear and moist and mucous membranes are normal.  Eyes: Conjunctivae, EOM and lids are normal. Pupils are equal, round, and reactive to light. Lids are everted and swept, no foreign bodies found.  Neck: Trachea normal and normal range of motion. Neck supple. Carotid bruit is not present. No thyroid mass and no thyromegaly present.  Cardiovascular: Normal rate, regular rhythm, S1 normal, S2 normal, normal heart sounds, intact distal pulses and normal pulses.  Exam reveals no gallop and no friction rub.   No murmur heard. Pulmonary/Chest: Effort normal and breath sounds normal. No tachypnea. No respiratory distress. She has no decreased breath sounds. She has no wheezes. She has no rhonchi. She has no rales.  Abdominal: Soft. Normal appearance and bowel sounds are normal. There is no hepatosplenomegaly. There is no tenderness. There is no rigidity, no rebound, no guarding and no CVA tenderness.  Neurological: She is alert.  Skin: Skin is warm, dry and intact. No rash noted.  Psychiatric: Her speech is normal and behavior is normal. Judgment and thought content normal. Her mood appears not anxious. Cognition and memory are normal. She does not exhibit a depressed mood.          Assessment & Plan:

## 2016-07-16 NOTE — Patient Instructions (Addendum)
Avoid tylenol and stop zetia for now. Please stop at the front desk to set up referral for [place]  Please stop at the lab to set up to have labs drawn.

## 2016-07-16 NOTE — Assessment & Plan Note (Signed)
Concern for progression. Re-eval with [place].

## 2016-07-17 LAB — GAMMA GT: GGT: 40 U/L (ref 7–51)

## 2016-07-20 ENCOUNTER — Telehealth: Payer: Self-pay | Admitting: Family Medicine

## 2016-07-20 ENCOUNTER — Ambulatory Visit
Admission: RE | Admit: 2016-07-20 | Discharge: 2016-07-20 | Disposition: A | Discharge status: Home / Self Care | Payer: PPO | Source: Ambulatory Visit | Attending: Family Medicine | Admitting: Family Medicine

## 2016-07-20 DIAGNOSIS — K8020 Calculus of gallbladder without cholecystitis without obstruction: Secondary | ICD-10-CM | POA: Insufficient documentation

## 2016-07-20 DIAGNOSIS — R7989 Other specified abnormal findings of blood chemistry: Secondary | ICD-10-CM

## 2016-07-20 DIAGNOSIS — R945 Abnormal results of liver function studies: Principal | ICD-10-CM

## 2016-07-20 DIAGNOSIS — K760 Fatty (change of) liver, not elsewhere classified: Secondary | ICD-10-CM

## 2016-07-20 NOTE — Telephone Encounter (Signed)
-----   Message from Carter Kitten, [place] sent at 07/20/2016 11:40 AM EST ----- Ms. Buckingham notified as instructed by telephone.  She would like to move forward with GI and surgical referral.

## 2016-07-25 ENCOUNTER — Other Ambulatory Visit: Payer: Self-pay | Admitting: Family Medicine

## 2016-07-27 ENCOUNTER — Other Ambulatory Visit: Payer: Self-pay | Admitting: Family Medicine

## 2016-07-29 ENCOUNTER — Ambulatory Visit: Payer: Self-pay | Admitting: Surgery

## 2016-07-29 DIAGNOSIS — K8050 Calculus of bile duct without cholangitis or cholecystitis without obstruction: Secondary | ICD-10-CM | POA: Diagnosis not present

## 2016-07-29 NOTE — H&P (Signed)
Michelle Donovan 07/29/2016 8:56 AM Location: [facility] Patient #: [id number] DOB: 04-05-1946 Divorced / Language: [name] / Race: White Female  History of Present Illness (Adelbert Gaspard A. Kae Heller MD; 07/29/2016 9:33 AM) Patient words: This is a very nice 71 year old woman who is referred for evaluation for gallstones. She was found to have mildly elevated LFTs when her labs are being checked by her neurologist for monitoring while on medications for her chronic tremor. She was noted to have mildly elevated AST and ALT, and on subsequent ultrasound, both fatty liver and gallstones were noted. The liver enzymes have trended down since that evaluation but are still mildly elevated. She has some vague symptoms of biliary colic, she does describe some right upper quadrant pain radiating around to her back, significant reflux as well. She did have one episode where the pain radiated into her chest, she thought she was having a heart attack and went to the emergency department-that was actually the first time she was noted to have stones. She is interested in cholecystectomy.  Her surgical history is noted for laparoscopic hysterectomy completed by bladder injury, and subsequent laparoscopic unilateral salpingo-oophorectomy for the retained ovary that had subsequent leak caused her to be septic for some reason. Prior to all that she had a tubal ligation.  Her medical history does include lung nodules which are thought to be benign and stable, but no other cardiopulmonary disease. She has a tremor for which she takes Klonopin and propranolol as well as Artane.  The patient is a 71 year old female.   Past Surgical History [name], [place]; 07/29/2016 8:56 AM) Colon Polyp Removal - Colonoscopy Hysterectomy (not due to cancer) - Complete Tonsillectomy  Diagnostic Studies History [name], [place]; 07/29/2016 8:56 AM) Colonoscopy >10 years ago Mammogram 1-3 years ago Pap Smear >5 years  ago  Allergies [name], CMA; 07/29/2016 8:58 AM) Codeine Phosphate *ANALGESICS - OPIOID* Nausea and Vomiting Topamax *ANTICONVULSANTS* Hair loss Allergies Reconciled  Medication History [name], CMA; 07/29/2016 8:58 AM) ClonazePAM (0.5MG  Tablet, Oral daily) Active. Montelukast Sodium (10MG  Tablet, Oral daily) Active. Propranolol HCl ER (120MG  Capsule ER 24HR, Oral daily) Active. ValACYclovir HCl (1GM Tablet, Oral daily) Active. Trihexyphenidyl HCl (2MG  Tablet, Oral daily) Active. Medications Reconciled  Social History [name], [place]; 07/29/2016 8:56 AM) Alcohol use Remotely quit alcohol use. Caffeine use Carbonated beverages, Coffee, Tea. No drug use Tobacco use Former smoker.  Family History [name], [place]; 07/29/2016 8:56 AM) Alcohol Abuse Daughter. Cerebrovascular Accident Mother. Depression Daughter. Diabetes Mellitus Brother, Mother. Heart Disease Brother, Mother. Heart disease in female family member before age 51 Hypertension Brother. Melanoma Sister. Respiratory Condition Brother. Thyroid problems Mother.  Pregnancy / Birth History [name], [place]; 07/29/2016 8:56 AM) Age at menarche 66 years. Age of menopause 51-55 Contraceptive History Oral contraceptives. Gravida 1 Irregular periods Maternal age 59-35 Para 1  Other Problems [name], [place]; 07/29/2016 8:56 AM) Asthma Back Pain Chest pain Cholelithiasis General anesthesia - complications Hemorrhoids High blood pressure Hypercholesterolemia Oophorectomy Bilateral. Thyroid Disease     Review of Systems [name] CMA; 07/29/2016 8:56 AM) General Present- Fatigue, Night Sweats and Weight Gain. Not Present- Appetite Loss, Chills, Fever and Weight Loss. Skin Present- Dryness and New Lesions. Not Present- Change in Wart/Mole, Hives, Jaundice, Non-Healing Wounds, Rash and Ulcer. HEENT Present- Ringing in the Ears and Wears  glasses/contact lenses. Not Present- Earache, Hearing Loss, Hoarseness, Nose Bleed, Oral Ulcers, Seasonal Allergies, Sinus Pain, Sore Throat, Visual Disturbances and Yellow Eyes. Respiratory Present- Chronic  Cough. Not Present- Bloody sputum, Difficulty Breathing, Snoring and Wheezing. Breast Not Present- Breast Mass, Breast Pain, Nipple Discharge and Skin Changes. Cardiovascular Present- Leg Cramps, Palpitations and Swelling of Extremities. Not Present- Chest Pain, Difficulty Breathing Lying Down, Rapid Heart Rate and Shortness of Breath. Gastrointestinal Present- Bloating, Constipation, Difficulty Swallowing, Excessive gas, Hemorrhoids and Indigestion. Not Present- Abdominal Pain, Bloody Stool, Change in Bowel Habits, Chronic diarrhea, Gets full quickly at meals, Nausea, Rectal Pain and Vomiting. Female Genitourinary Not Present- Frequency, Nocturia, Painful Urination, Pelvic Pain and Urgency. Musculoskeletal Present- Back Pain, Joint Pain, Joint Stiffness, Muscle Pain and Muscle Weakness. Not Present- Swelling of Extremities. Neurological Present- Tremor. Not Present- Decreased Memory, Fainting, Headaches, Numbness, Seizures, Tingling, Trouble walking and Weakness. Psychiatric Not Present- Anxiety, Bipolar, Change in Sleep Pattern, Depression, Fearful and Frequent crying. Endocrine Present- Cold Intolerance and Hot flashes. Not Present- Excessive Hunger, Hair Changes, Heat Intolerance and New Diabetes. Hematology Not Present- Blood Thinners, Easy Bruising, Excessive bleeding, Gland problems, HIV and Persistent Infections.  Vitals [name] CMA; 07/29/2016 8:58 AM) 07/29/2016 8:58 AM Weight: 155.8 lb Height: 65in Body Surface Area: 1.78 m Body Mass Index: 25.93 kg/m  Temp.: 12F  Pulse: 65 (Regular)  BP: 118/62 (Sitting, Left Arm, Standard)      Physical Exam (Jakiah Goree A. Kae Heller MD; 07/29/2016 9:35 AM)  General Note: She is alert and oriented, appears stated  age  Integumentary Note: No lesions or rashes on limited skin exam  Head and Neck Note: No mass or thyromegaly, but she endorses history of thyroid nodules  Eye Note: Anicteric, ocular motions intact  ENMT Note: Moist mucous membranes, good dentition  Chest and Lung Exam Note: Unlabored respirations, symmetrical air entry  Cardiovascular Note: Regular rate and rhythm, no pedal edema  Abdomen Note: Centrally obese, nontender nondistended. Reducible incisional hernia at the umbilicus. No mass or organomegaly palpable  Neurologic Note: Grossly intact, normal gait  Neuropsychiatric Note: Normal mood and affect, mildly tremulous, appropriate insight  Musculoskeletal Note: Strength symmetrical throughout, no deformity    Assessment & Plan (Daviel Allegretto A. Kae Heller MD; 07/29/2016 4:82 AM)  BILIARY COLIC (L07.86) Story: We discussed laparoscopic cholecystectomy. We discussed that this is indicated due to the combination of stones and symptoms. We discussed that her elevated LFTs are more likely attributable to the fatty liver changes noted on ultrasound. She does endorse poor dietary choices including a lot of fast food. We discussed the importance of changing that pattern. We discussed the nature of gallbladder surgery, leaks with good recovery, and the risks involved including pain, bleeding, scarring, injury to adjacent structures specifically the common bile duct and sequelae, and conversion to open surgery. She expressed understanding and asked appropriate questions. We will get her scheduled at her earliest convenience.

## 2016-08-02 ENCOUNTER — Ambulatory Visit (INDEPENDENT_AMBULATORY_CARE_PROVIDER_SITE_OTHER): Payer: PPO | Admitting: Family Medicine

## 2016-08-02 ENCOUNTER — Encounter: Payer: Self-pay | Admitting: Family Medicine

## 2016-08-02 DIAGNOSIS — H5712 Ocular pain, left eye: Secondary | ICD-10-CM | POA: Diagnosis not present

## 2016-08-02 DIAGNOSIS — H0015 Chalazion left lower eyelid: Secondary | ICD-10-CM | POA: Diagnosis not present

## 2016-08-02 MED ORDER — ERYTHROMYCIN 5 MG/GM OP OINT: 1.0000 "application " | TOPICAL_OINTMENT | Freq: Four times a day (QID) | OPHTHALMIC | 0 refills | Status: DC

## 2016-08-02 NOTE — Progress Notes (Signed)
Pre visit review using our clinic review tool, if applicable. No additional management support is needed unless otherwise documented below in the visit note. 

## 2016-08-02 NOTE — Patient Instructions (Signed)
Great to see you. Use the ointment as directed.  Please stop by to see Rosaria Ferries on your way out.

## 2016-08-02 NOTE — Progress Notes (Signed)
Subjective:   Patient ID: Michelle Donovan, female    DOB: 11/05/1945, 71 y.o.   MRN: [id number]  Michelle Donovan is a pleasant 71 y.o. year old female who presents to clinic today with Eye Problem (Swelling and redness in the left eye.Started around 07-29-16)  on 08/02/2016  HPI:  Left eye pain and redness for almost 5 days. Feels it is getting worse. No drainage No photophobia.  Has not tried anything for it.  Current Outpatient Prescriptions on File Prior to Visit  Medication Sig Dispense Refill  . cetirizine (ZYRTEC) 10 MG tablet Take 10 mg by mouth daily.    . clonazePAM (KLONOPIN) 0.5 MG tablet Take one in the morning, half in the evening 45 tablet 1  . montelukast (SINGULAIR) 10 MG tablet TAKE 1 TABLET BY MOUTH AT BEDTIME 90 tablet 0  . propranolol ER (INDERAL LA) 120 MG 24 hr capsule TAKE 1 CAPSULE (120 MG TOTAL) BY MOUTH DAILY. 90 capsule 1  . triamcinolone cream (KENALOG) 0.1 % Apply 1 application topically 2 (two) times daily as needed.  1  . trihexyphenidyl (ARTANE) 2 MG tablet TAKE 1 TABLET (2 MG TOTAL) BY MOUTH 2 (TWO) TIMES DAILY WITH A MEAL. (Patient taking differently: Take 1 mg by mouth at bedtime. ) 60 tablet 0  . valACYclovir (VALTREX) 1000 MG tablet Take 2 tabs every 12 hours x 1 day as needed for cold sores 20 tablet 0   No current facility-administered medications on file prior to visit.     Allergies  Allergen Reactions  . Codeine     REACTION: vomiting  . Topamax [Topiramate] Other (See Comments)    Hair loss    Past Medical History:  Diagnosis Date  . Allergic rhinitis, cause unspecified   . Dermatophytosis of scalp and beard   . Diverticulosis   . Essential and other specified forms of tremor   . HTN (hypertension)   . Irritable bowel syndrome   . Lumbago    bulging disk per MRI   . Mixed hyperlipidemia   . Other acute reactions to stress   . Other diseases of lung, not elsewhere classified    solitary pulm. nodule(left)  .  Unspecified asthma(493.90)   . Unspecified vitamin D deficiency     Past Surgical History:  Procedure Laterality Date  . LAPAROSCOPIC TOTAL HYSTERECTOMY  2002  . LAPAROSCOPIC UNILATERAL SALPINGO OOPHERECTOMY    . TONSILLECTOMY  1967/68  . TUBAL LIGATION  1986    Family History  Problem Relation Age of Onset  . Stroke Mother   . Tremor Mother   . Diabetes Mother   . Heart failure Mother   . Other Father     Trigeminal neuralgia  . Diabetes Sister   . Coronary artery disease Sister   . Irritable bowel syndrome Sister   . Tremor Sister   . Diabetes Brother     x 3  . Coronary artery disease Brother     x 3  . Irritable bowel syndrome Brother     x 3  . Tremor Brother     x 3  . Breast cancer      Aunts and cousin  . Colon cancer Maternal Grandfather     Social History   Social History  . Marital status: Divorced    Spouse name: N/A  . Number of children: 1  . Years of education: N/A   Occupational History  . retired Other    [employer]  .  Retired  . part time [place] History Main Topics  . Smoking status: Former Smoker    Quit date: 05/17/1980  . Smokeless tobacco: Never Used  . Alcohol use No  . Drug use: No  . Sexual activity: No   Other Topics Concern  . 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  Review of Systems  Constitutional: Negative for fatigue.  Eyes: Positive for pain, redness and itching. Negative for photophobia, discharge and visual disturbance.  All other systems reviewed and are negative.      Objective:    BP 118/76 (BP Location: Left Arm, Patient Position: Sitting, Cuff Size: Normal)   Pulse 68   Temp 97.4 F (36.3 C) (Oral)   Wt  157 lb (71.2 kg)   SpO2 98%   BMI 26.13 kg/m    Physical Exam  Constitutional: She is oriented to person, place, and time. She appears well-developed and well-nourished. No distress.  HENT:  Head: Normocephalic and atraumatic.  Eyes: Left eye exhibits hordeolum. No foreign body present in the left eye. Left conjunctiva is injected. No scleral icterus. Left eye exhibits normal extraocular motion and no nystagmus.  Neurological: She is alert and oriented to person, place, and time. No cranial nerve deficit.  Skin: Skin is warm and dry. She is not diaphoretic.  Psychiatric: She has a normal mood and affect. Her behavior is normal. Judgment and thought content normal.  Nursing note and vitals reviewed.         Assessment & Plan:   Pain in or around eye, left - Plan: Ambulatory referral to Ophthalmology No Follow-up on file.

## 2016-08-02 NOTE — Assessment & Plan Note (Signed)
Place on erythromycin ointment. Due to amount of redness around eye, will refer to optho for dilated and complete eye exam/rule out cellulitis. The patient indicates understanding of these issues and agrees with the plan.

## 2016-08-17 ENCOUNTER — Ambulatory Visit: Payer: PPO | Admitting: Gastroenterology

## 2016-08-19 ENCOUNTER — Ambulatory Visit (HOSPITAL_COMMUNITY)
Admission: RE | Admit: 2016-08-19 | Discharge: 2016-08-19 | Disposition: A | Discharge status: Home / Self Care | Payer: PPO | Source: Ambulatory Visit | Attending: Surgery | Admitting: Surgery

## 2016-08-19 ENCOUNTER — Encounter (HOSPITAL_COMMUNITY): Payer: Self-pay

## 2016-08-19 ENCOUNTER — Encounter (HOSPITAL_COMMUNITY)
Admission: RE | Admit: 2016-08-19 | Discharge: 2016-08-19 | Disposition: A | Discharge status: Home / Self Care | Payer: PPO | Source: Ambulatory Visit | Attending: Surgery | Admitting: Surgery

## 2016-08-19 DIAGNOSIS — E785 Hyperlipidemia, unspecified: Secondary | ICD-10-CM | POA: Diagnosis not present

## 2016-08-19 DIAGNOSIS — Z01812 Encounter for preprocedural laboratory examination: Secondary | ICD-10-CM | POA: Diagnosis not present

## 2016-08-19 DIAGNOSIS — Z01818 Encounter for other preprocedural examination: Secondary | ICD-10-CM | POA: Diagnosis not present

## 2016-08-19 DIAGNOSIS — J45909 Unspecified asthma, uncomplicated: Secondary | ICD-10-CM | POA: Insufficient documentation

## 2016-08-19 DIAGNOSIS — E119 Type 2 diabetes mellitus without complications: Secondary | ICD-10-CM | POA: Insufficient documentation

## 2016-08-19 DIAGNOSIS — Z01810 Encounter for preprocedural cardiovascular examination: Secondary | ICD-10-CM

## 2016-08-19 DIAGNOSIS — Z87891 Personal history of nicotine dependence: Secondary | ICD-10-CM | POA: Insufficient documentation

## 2016-08-19 DIAGNOSIS — I10 Essential (primary) hypertension: Secondary | ICD-10-CM | POA: Diagnosis not present

## 2016-08-19 DIAGNOSIS — Z9889 Other specified postprocedural states: Secondary | ICD-10-CM | POA: Insufficient documentation

## 2016-08-19 DIAGNOSIS — E041 Nontoxic single thyroid nodule: Secondary | ICD-10-CM | POA: Diagnosis not present

## 2016-08-19 DIAGNOSIS — K589 Irritable bowel syndrome without diarrhea: Secondary | ICD-10-CM | POA: Insufficient documentation

## 2016-08-19 DIAGNOSIS — K760 Fatty (change of) liver, not elsewhere classified: Secondary | ICD-10-CM | POA: Diagnosis not present

## 2016-08-19 DIAGNOSIS — Z90710 Acquired absence of both cervix and uterus: Secondary | ICD-10-CM | POA: Diagnosis not present

## 2016-08-19 HISTORY — DX: Unspecified asthma, uncomplicated: J45.909

## 2016-08-19 HISTORY — DX: Other specified postprocedural states: Z98.890

## 2016-08-19 HISTORY — DX: Nausea with vomiting, unspecified: R11.2

## 2016-08-19 HISTORY — DX: Nontoxic single thyroid nodule: E04.1

## 2016-08-19 HISTORY — DX: Type 2 diabetes mellitus without complications: E11.9

## 2016-08-19 LAB — BASIC METABOLIC PANEL
Anion gap: 8 (ref 5–15)
BUN: <5 mg/dL — ABNORMAL LOW (ref 6–20)
CALCIUM: 9.4 mg/dL (ref 8.9–10.3)
CO2: 27 mmol/L (ref 22–32)
CREATININE: 0.78 mg/dL (ref 0.44–1.00)
Chloride: 103 mmol/L (ref 101–111)
GLUCOSE: 126 mg/dL — AB (ref 65–99)
Potassium: 3.5 mmol/L (ref 3.5–5.1)
Sodium: 138 mmol/L (ref 135–145)

## 2016-08-19 LAB — CBC WITH DIFFERENTIAL/PLATELET
BASOS PCT: 0 %
Basophils Absolute: 0 10*3/uL (ref 0.0–0.1)
EOS ABS: 0.3 10*3/uL (ref 0.0–0.7)
EOS PCT: 4 %
HCT: 41.7 % (ref 36.0–46.0)
Hemoglobin: 13.2 g/dL (ref 12.0–15.0)
Lymphocytes Relative: 37 %
Lymphs Abs: 2.8 10*3/uL (ref 0.7–4.0)
MCH: 29 pg (ref 26.0–34.0)
MCHC: 31.7 g/dL (ref 30.0–36.0)
MCV: 91.6 fL (ref 78.0–100.0)
MONO ABS: 0.4 10*3/uL (ref 0.1–1.0)
MONOS PCT: 6 %
Neutro Abs: 4 10*3/uL (ref 1.7–7.7)
Neutrophils Relative %: 53 %
PLATELETS: 218 10*3/uL (ref 150–400)
RBC: 4.55 MIL/uL (ref 3.87–5.11)
RDW: 12.8 % (ref 11.5–15.5)
WBC: 7.5 10*3/uL (ref 4.0–10.5)

## 2016-08-19 NOTE — Progress Notes (Signed)
PCP is Dr. Jinny Sanders States she saw Dr Serafina Royals many years ago. States she had a stress test and echo many years ago (greater than 5 years ago). Denies ever having a card cath. Denies any chest pain. States she will have a spot removed from her right chest tomorrow- that is skin cancer.

## 2016-08-19 NOTE — Pre-Procedure Instructions (Signed)
Michelle Donovan  08/19/2016      [place], [place] - [place] [address] Phone: [phone] Fax: [phone]  [id number] [place] [address] [place] [address] [place] [address] Phone: [phone] Fax: [phone]    Your procedure is scheduled on Monday, August 23, 2016.  Report to [facility] Admitting at 05:30 A.M.  Call this number if you have problems the morning of surgery:  [phone]   Remember:  Do not eat food or drink liquids after midnight.  Take these medicines the morning of surgery with A SIP OF WATER cetirizine (Zyrtec), clonazepam (Klonopin), erythromycin Eye Drops, propranolol ER (Inderal).   STOP taking BC's, Goody's, Aspirin, Advil, Motrin/Ibuprofen, Aleve, Vitamins, Fish Oils, Herbal Medications.    Do not wear jewelry, make-up or nail polish.  Do not wear lotions, powders, or perfumes, or deoderant.  Do not shave 48 hours prior to surgery.    Do not bring valuables to the hospital.   [facility] is not responsible for any belongings or valuables.  Contacts, dentures or bridgework may not be worn into surgery.  Leave your suitcase in the car.  After surgery it may be brought to your room.  For patients admitted to the hospital, discharge time will be determined by your treatment team.  Patients discharged the day of surgery will not be allowed to drive home.    Special instructions:   [facility]- Preparing For Surgery  Before surgery, you can play an important role. Because skin is not sterile, your skin needs to be as free of germs as possible. You can reduce the number of germs on your skin by washing with CHG (chlorahexidine gluconate) Soap before surgery.  CHG is an antiseptic cleaner which kills germs and bonds with the skin to continue killing germs even after washing.  Please do not use if you have an allergy to CHG or  antibacterial soaps. If your skin becomes reddened/irritated stop using the CHG.  Do not shave (including legs and underarms) for at least 48 hours prior to first CHG shower. It is OK to shave your face.  Please follow these instructions carefully.   1. Shower the NIGHT BEFORE SURGERY and the MORNING OF SURGERY with CHG.   2. If you chose to wash your hair, wash your hair first as usual with your normal shampoo.  3. After you shampoo, rinse your hair and body thoroughly to remove the shampoo.  4. Use CHG as you would any other liquid soap. You can apply CHG directly to the skin and wash gently with a scrungie or a clean washcloth.   5. Apply the CHG Soap to your body ONLY FROM THE NECK DOWN.  Do not use on open wounds or open sores. Avoid contact with your eyes, ears, mouth and genitals (private parts). Wash genitals (private parts) with your normal soap.  6. Wash thoroughly, paying special attention to the area where your surgery will be performed.  7. Thoroughly rinse your body with warm water from the neck down.  8. DO NOT shower/wash with your normal soap after using and rinsing off the CHG Soap.  9. Pat yourself dry with a CLEAN TOWEL.   10. Wear CLEAN PAJAMAS   11. Place CLEAN SHEETS on your bed the night of your first shower and DO NOT SLEEP WITH PETS.    Day of Surgery: Do not apply  any deodorants/lotions. Please wear clean clothes to the hospital/surgery center.      Please read over the following fact sheets that you were given. Pain Booklet, Coughing and Deep Breathing and Surgical Site Infection Prevention

## 2016-08-19 NOTE — Progress Notes (Signed)
Orders to give Tylenol 1000mg  DOS.  Pt has allergy listed for Tylenol--elevated liver enzymes.  Office called to make aware.  Orders to d/c Tylenol given.

## 2016-08-20 ENCOUNTER — Encounter (HOSPITAL_COMMUNITY): Payer: Self-pay

## 2016-08-20 DIAGNOSIS — D045 Carcinoma in situ of skin of trunk: Secondary | ICD-10-CM | POA: Diagnosis not present

## 2016-08-20 NOTE — Progress Notes (Signed)
Anesthesia chart review: Patient is a 71 year old female scheduled for laparoscopic cholecystectomy on 08/23/2016 by Dr. Kae Heller.  History includes former smoker quit 84, hyperlipidemia, IBS, tremor (on Artane), asthma, hypertension, right pulmonary nodules (c/w benign etiology 09/24/08 CT), hysterectomy, post-operative N/V, tonsillectomy, fatty liver, elevated LFTs, thyroid nodules. DM2 is listed, but patient denied any issues. (I did not see this listed as a diagnosis several of Dr. Arley Phenix' notes that I reviewed. A1c done by Dr. Carles Collet was 6.2% on 06/18/16.)   PCP is Dr. Eliezer Lofts. Patient is not regularly seen by a cardiologist, but reportedly was seen by Dr. Serafina Royals many years (> 5) ago for stress and echo.   Neurologist is Dr. Wells Guiles Tat.  Meds include Zyrtec, clonazepam, Singulair, Inderal LA, Valtrex, Artane.  BP 136/60   Pulse 73   Temp 36.6 C   Resp 18   Ht 5\' 5"  (1.651 m)   Wt 157 lb 4.8 oz (71.4 kg)   SpO2 96%   BMI 26.18 kg/m   EKG 08/19/16: SR with first-degree AV block, low voltage QRS. Artifact in V6.  CXR 08/19/16: IMPRESSION: Stable examination:  No acute cardiopulmonary process.  Abdominal U/S 07/20/16: IMPRESSION: Multiple gallstones without sonographic evidence of acute cholecystitis. Increased hepatic echotexture consistent with fatty infiltrative change.  Preoperative labs noted. Cr 0.78. Non-fasting glucose 126. CBC WNL. Last LFTs on 07/16/16 showed AST 109, ALT 95 and were stable when compared to 07/08/16 labs (which lead to abdominal U/S and general surgery referral).   If no acute changes then I would anticipate that he could proceed as planned.  George Hugh [facility] Short Stay Center/Anesthesiology Phone [phone] 08/20/2016 10:37 AM

## 2016-08-23 ENCOUNTER — Ambulatory Visit (HOSPITAL_COMMUNITY): Payer: PPO | Admitting: Certified Registered Nurse Anesthetist

## 2016-08-23 ENCOUNTER — Encounter (HOSPITAL_COMMUNITY)
Admission: RE | Disposition: A | Discharge status: Home / Self Care | Payer: Self-pay | Source: Ambulatory Visit | Attending: Surgery

## 2016-08-23 ENCOUNTER — Encounter (HOSPITAL_COMMUNITY): Payer: Self-pay | Admitting: Certified Registered Nurse Anesthetist

## 2016-08-23 ENCOUNTER — Ambulatory Visit (HOSPITAL_COMMUNITY)
Admission: RE | Admit: 2016-08-23 | Discharge: 2016-08-23 | Disposition: A | Discharge status: Home / Self Care | Payer: PPO | Source: Ambulatory Visit | Attending: Surgery | Admitting: Surgery

## 2016-08-23 DIAGNOSIS — K430 Incisional hernia with obstruction, without gangrene: Secondary | ICD-10-CM | POA: Insufficient documentation

## 2016-08-23 DIAGNOSIS — Z888 Allergy status to other drugs, medicaments and biological substances status: Secondary | ICD-10-CM | POA: Insufficient documentation

## 2016-08-23 DIAGNOSIS — I10 Essential (primary) hypertension: Secondary | ICD-10-CM | POA: Insufficient documentation

## 2016-08-23 DIAGNOSIS — Z885 Allergy status to narcotic agent status: Secondary | ICD-10-CM | POA: Insufficient documentation

## 2016-08-23 DIAGNOSIS — E119 Type 2 diabetes mellitus without complications: Secondary | ICD-10-CM | POA: Diagnosis not present

## 2016-08-23 DIAGNOSIS — K8070 Calculus of gallbladder and bile duct without cholecystitis without obstruction: Secondary | ICD-10-CM | POA: Diagnosis not present

## 2016-08-23 DIAGNOSIS — Z87891 Personal history of nicotine dependence: Secondary | ICD-10-CM | POA: Diagnosis not present

## 2016-08-23 DIAGNOSIS — J45909 Unspecified asthma, uncomplicated: Secondary | ICD-10-CM | POA: Insufficient documentation

## 2016-08-23 DIAGNOSIS — K8050 Calculus of bile duct without cholangitis or cholecystitis without obstruction: Secondary | ICD-10-CM | POA: Diagnosis not present

## 2016-08-23 DIAGNOSIS — Z79899 Other long term (current) drug therapy: Secondary | ICD-10-CM | POA: Diagnosis not present

## 2016-08-23 DIAGNOSIS — K8010 Calculus of gallbladder with chronic cholecystitis without obstruction: Secondary | ICD-10-CM | POA: Diagnosis not present

## 2016-08-23 DIAGNOSIS — J4530 Mild persistent asthma, uncomplicated: Secondary | ICD-10-CM | POA: Diagnosis not present

## 2016-08-23 HISTORY — PX: CHOLECYSTECTOMY: SHX55

## 2016-08-23 SURGERY — LAPAROSCOPIC CHOLECYSTECTOMY
Anesthesia: General | Site: Abdomen

## 2016-08-23 MED ORDER — LIDOCAINE 2% (20 MG/ML) 5 ML SYRINGE
INTRAMUSCULAR | Status: AC
  Filled 2016-08-23: qty 5

## 2016-08-23 MED ORDER — GABAPENTIN 300 MG PO CAPS
300.0000 mg | ORAL_CAPSULE | ORAL | Status: AC
  Administered 2016-08-23: 300 mg via ORAL
  Filled 2016-08-23: qty 1

## 2016-08-23 MED ORDER — FENTANYL CITRATE (PF) 100 MCG/2ML IJ SOLN: 25.0000 ug | INTRAMUSCULAR | Status: DC | PRN

## 2016-08-23 MED ORDER — FENTANYL CITRATE (PF) 250 MCG/5ML IJ SOLN
INTRAMUSCULAR | Status: AC
Start: 2016-08-23 — End: 2016-08-23
  Filled 2016-08-23: qty 5

## 2016-08-23 MED ORDER — CEFAZOLIN SODIUM-DEXTROSE 2-4 GM/100ML-% IV SOLN
2.0000 g | INTRAVENOUS | Status: AC
  Administered 2016-08-23: 2 g via INTRAVENOUS
  Filled 2016-08-23: qty 100

## 2016-08-23 MED ORDER — PHENYLEPHRINE HCL 10 MG/ML IJ SOLN
INTRAMUSCULAR | Status: DC | PRN
  Administered 2016-08-23: 80 ug via INTRAVENOUS

## 2016-08-23 MED ORDER — ROCURONIUM BROMIDE 100 MG/10ML IV SOLN
INTRAVENOUS | Status: DC | PRN
Start: 2016-08-23 — End: 2016-08-23
  Administered 2016-08-23: 40 mg via INTRAVENOUS

## 2016-08-23 MED ORDER — SCOPOLAMINE 1 MG/3DAYS TD PT72
MEDICATED_PATCH | TRANSDERMAL | Status: AC
  Filled 2016-08-23: qty 1

## 2016-08-23 MED ORDER — BUPIVACAINE HCL (PF) 0.25 % IJ SOLN
INTRAMUSCULAR | Status: DC | PRN
  Administered 2016-08-23: 20 mL

## 2016-08-23 MED ORDER — CHLORHEXIDINE GLUCONATE 4 % EX LIQD: 60.0000 mL | Freq: Once | CUTANEOUS | Status: DC

## 2016-08-23 MED ORDER — BUPIVACAINE HCL (PF) 0.25 % IJ SOLN
INTRAMUSCULAR | Status: AC
  Filled 2016-08-23: qty 30

## 2016-08-23 MED ORDER — 0.9 % SODIUM CHLORIDE (POUR BTL) OPTIME
TOPICAL | Status: DC | PRN
  Administered 2016-08-23: 1000 mL

## 2016-08-23 MED ORDER — MIDAZOLAM HCL 2 MG/2ML IJ SOLN
INTRAMUSCULAR | Status: AC
  Filled 2016-08-23: qty 2

## 2016-08-23 MED ORDER — DOCUSATE SODIUM 100 MG PO CAPS: 100.0000 mg | ORAL_CAPSULE | Freq: Two times a day (BID) | ORAL | 0 refills | Status: AC

## 2016-08-23 MED ORDER — ROCURONIUM BROMIDE 50 MG/5ML IV SOSY
PREFILLED_SYRINGE | INTRAVENOUS | Status: AC
  Filled 2016-08-23: qty 5

## 2016-08-23 MED ORDER — LACTATED RINGERS IV SOLN
INTRAVENOUS | Status: DC | PRN
  Administered 2016-08-23: 07:00:00 via INTRAVENOUS

## 2016-08-23 MED ORDER — MIDAZOLAM HCL 5 MG/5ML IJ SOLN
INTRAMUSCULAR | Status: DC | PRN
  Administered 2016-08-23: 2 mg via INTRAVENOUS

## 2016-08-23 MED ORDER — OXYCODONE HCL 5 MG PO TABS: 5.0000 mg | ORAL_TABLET | Freq: Four times a day (QID) | ORAL | 0 refills | Status: DC | PRN

## 2016-08-23 MED ORDER — LIDOCAINE HCL (CARDIAC) 20 MG/ML IV SOLN
INTRAVENOUS | Status: DC | PRN
  Administered 2016-08-23: 100 mg via INTRAVENOUS

## 2016-08-23 MED ORDER — ONDANSETRON HCL 4 MG/2ML IJ SOLN
INTRAMUSCULAR | Status: AC
  Filled 2016-08-23: qty 2

## 2016-08-23 MED ORDER — SUGAMMADEX SODIUM 200 MG/2ML IV SOLN
INTRAVENOUS | Status: DC | PRN
  Administered 2016-08-23: 50 mg via INTRAVENOUS
  Administered 2016-08-23: 142.4 mg via INTRAVENOUS

## 2016-08-23 MED ORDER — CELECOXIB 200 MG PO CAPS
400.0000 mg | ORAL_CAPSULE | ORAL | Status: AC
  Administered 2016-08-23: 400 mg via ORAL
  Filled 2016-08-23: qty 2

## 2016-08-23 MED ORDER — DEXAMETHASONE SODIUM PHOSPHATE 10 MG/ML IJ SOLN
INTRAMUSCULAR | Status: DC | PRN
  Administered 2016-08-23: 5 mg via INTRAVENOUS

## 2016-08-23 MED ORDER — SUGAMMADEX SODIUM 200 MG/2ML IV SOLN
INTRAVENOUS | Status: AC
  Filled 2016-08-23: qty 2

## 2016-08-23 MED ORDER — ONDANSETRON HCL 4 MG/2ML IJ SOLN
INTRAMUSCULAR | Status: DC | PRN
  Administered 2016-08-23: 4 mg via INTRAVENOUS

## 2016-08-23 MED ORDER — PHENYLEPHRINE 40 MCG/ML (10ML) SYRINGE FOR IV PUSH (FOR BLOOD PRESSURE SUPPORT)
PREFILLED_SYRINGE | INTRAVENOUS | Status: AC
  Filled 2016-08-23: qty 10

## 2016-08-23 MED ORDER — PROPOFOL 10 MG/ML IV BOLUS
INTRAVENOUS | Status: AC
  Filled 2016-08-23: qty 40

## 2016-08-23 MED ORDER — DEXAMETHASONE SODIUM PHOSPHATE 10 MG/ML IJ SOLN
INTRAMUSCULAR | Status: AC
  Filled 2016-08-23: qty 1

## 2016-08-23 MED ORDER — PROPOFOL 10 MG/ML IV BOLUS
INTRAVENOUS | Status: DC | PRN
  Administered 2016-08-23: 160 mg via INTRAVENOUS

## 2016-08-23 MED ORDER — SCOPOLAMINE 1 MG/3DAYS TD PT72
MEDICATED_PATCH | TRANSDERMAL | Status: DC | PRN
  Administered 2016-08-23: 1 via TRANSDERMAL

## 2016-08-23 MED ORDER — FENTANYL CITRATE (PF) 100 MCG/2ML IJ SOLN
INTRAMUSCULAR | Status: DC | PRN
  Administered 2016-08-23: 150 ug via INTRAVENOUS

## 2016-08-23 SURGICAL SUPPLY — 39 items
ADH SKN CLS APL DERMABOND .7 (GAUZE/BANDAGES/DRESSINGS) ×1
APPLIER CLIP 5 13 M/L LIGAMAX5 (MISCELLANEOUS) ×2
APR CLP MED LRG 5 ANG JAW (MISCELLANEOUS) ×1
BAG SPEC RTRVL LRG 6X4 10 (ENDOMECHANICALS) ×1
CANISTER SUCT 3000ML PPV (MISCELLANEOUS) ×2 IMPLANT
CHLORAPREP W/TINT 26ML (MISCELLANEOUS) ×2 IMPLANT
CLIP APPLIE 5 13 M/L LIGAMAX5 (MISCELLANEOUS) ×1 IMPLANT
COVER SURGICAL LIGHT HANDLE (MISCELLANEOUS) ×2 IMPLANT
DERMABOND ADVANCED (GAUZE/BANDAGES/DRESSINGS) ×1
DERMABOND ADVANCED .7 DNX12 (GAUZE/BANDAGES/DRESSINGS) ×1 IMPLANT
ELECT REM PT RETURN 9FT ADLT (ELECTROSURGICAL) ×2
ELECTRODE REM PT RTRN 9FT ADLT (ELECTROSURGICAL) ×1 IMPLANT
GLOVE BIO SURGEON STRL SZ 6 (GLOVE) ×2 IMPLANT
GLOVE BIO SURGEON STRL SZ7.5 (GLOVE) ×1 IMPLANT
GLOVE BIOGEL PI IND STRL 6.5 (GLOVE) ×1 IMPLANT
GLOVE BIOGEL PI IND STRL 7.5 (GLOVE) IMPLANT
GLOVE BIOGEL PI INDICATOR 6.5 (GLOVE) ×1
GLOVE BIOGEL PI INDICATOR 7.5 (GLOVE) ×1
GOWN STRL REUS W/ TWL LRG LVL3 (GOWN DISPOSABLE) ×3 IMPLANT
GOWN STRL REUS W/TWL LRG LVL3 (GOWN DISPOSABLE) ×6
GRASPER SUT TROCAR 14GX15 (MISCELLANEOUS) ×2 IMPLANT
KIT BASIN OR (CUSTOM PROCEDURE TRAY) ×2 IMPLANT
KIT ROOM TURNOVER OR (KITS) ×2 IMPLANT
NDL INSUFFLATION 14GA 120MM (NEEDLE) ×1 IMPLANT
NEEDLE INSUFFLATION 14GA 120MM (NEEDLE) ×2 IMPLANT
NS IRRIG 1000ML POUR BTL (IV SOLUTION) ×2 IMPLANT
PAD ARMBOARD 7.5X6 YLW CONV (MISCELLANEOUS) ×2 IMPLANT
POUCH SPECIMEN RETRIEVAL 10MM (ENDOMECHANICALS) ×2 IMPLANT
SCISSORS LAP 5X35 DISP (ENDOMECHANICALS) ×2 IMPLANT
SET IRRIG TUBING LAPAROSCOPIC (IRRIGATION / IRRIGATOR) ×2 IMPLANT
SLEEVE ENDOPATH XCEL 5M (ENDOMECHANICALS) ×4 IMPLANT
SPECIMEN JAR SMALL (MISCELLANEOUS) ×2 IMPLANT
SUT MNCRL AB 4-0 PS2 18 (SUTURE) ×2 IMPLANT
TOWEL OR 17X24 6PK STRL BLUE (TOWEL DISPOSABLE) ×2 IMPLANT
TOWEL OR 17X26 10 PK STRL BLUE (TOWEL DISPOSABLE) ×1 IMPLANT
TRAY LAPAROSCOPIC MC (CUSTOM PROCEDURE TRAY) ×2 IMPLANT
TROCAR XCEL NON-BLD 11X100MML (ENDOMECHANICALS) ×2 IMPLANT
TROCAR XCEL NON-BLD 5MMX100MML (ENDOMECHANICALS) ×2 IMPLANT
TUBING INSUFFLATION (TUBING) ×2 IMPLANT

## 2016-08-23 NOTE — Op Note (Signed)
Operative Note  Jamaiya Tunnell 71 y.o. female [id number]  08/23/2016  Surgeon: Clovis Riley   Assistant: Sharyn Dross, RNFA  Procedure performed: Laparoscopic Cholecystectomy, Primary repair of incarcerated incisional hernia  Preop diagnosis: Biliary colic, incisional umbilical hernia Post-op diagnosis/intraop findings: same  Specimens: gallbladder  EBL: 68TL  Complications: none  Description of procedure: After obtaining informed consent the patient was brought to the operating room. Prophylactic antibiotics and subcutaneous heparin were administered. SCD's were applied. General endotracheal anesthesia was initiated and a formal time-out was performed. The abdomen was prepped and draped in the usual sterile fashion and the abdomen was entered using visiport technique in the left upper quadrant after instilling the site with local. Insufflation to 12mmHg was obtained and gross inspection revealed no evidence of injury from our entry. The known umbilical incisional hernia was inspected and found to have incarcerated omentum. Two 7mm trocars were introduced in the  right midclavicular and right anterior axillary lines under direct visualization and following infiltration with local. The incarcerated omentum at the umbilicus was gently bluntly reduced to exposed the hernia defect which was about 1cm in diameter.  An 29mm trocar was placed through this small fascial defect. The gallbladder was retracted cephalad and the infundibulum was retracted laterally. Her liver was noted to be pale, stiff and friable, consistent with suspected diagnosis of fatty liver. A combination of hook electrocautery and blunt dissection was utilized to clear the peritoneum from the neck and cystic duct, circumferentially isolating the cystic artery and cystic duct and lifting the gallbladder from the cystic plate. The critical view of safety was achieved with the cystic artery, cystic duct, and liver bed  visualized between them with no other structures. The artery was clipped with a single clip proximally and distally and divided as was the cystic duct with two clips on the proximal end. The gallbladder was dissected from the liver plate using electrocautery. Once freed the gallbladder was placed in an endocatch bag and removed intact through the umbilical trocar site. A small amount of bleeding on the liver bed was controlled with cautery. Hemostasis was once again confirmed, and reinspection of the abdomen revealed no injuries. The clips were well opposed without any bile leak from the duct or the liver bed. The 22mm trocar was removed and the fascia here closed with interrupted 0 vicryl sutures to repair the small hernia. The abdomen was re-insufflated and the repair inspected- it was air tight and without any injury or entrapment of abdominal contents. The abdomen was desufflated and all remaining trocars removed. The skin incisions were closed with running subcuticular monocryl and Dermabond. The patient was awakened, extubated and transported to the recovery room in stable condition.   All counts were correct at the completion of the case.

## 2016-08-23 NOTE — Anesthesia Postprocedure Evaluation (Signed)
Anesthesia Post Note  Patient: Michelle Donovan  Procedure(s) Performed: Procedure(s) (LRB): LAPAROSCOPIC CHOLECYSTECTOMY (N/A)  Patient location during evaluation: PACU Anesthesia Type: General Level of consciousness: awake Pain management: pain level controlled Vital Signs Assessment: post-procedure vital signs reviewed and stable Respiratory status: spontaneous breathing Cardiovascular status: stable Anesthetic complications: no       Last Vitals:  Vitals:   08/23/16 0935 08/23/16 0946  BP: (!) 141/78 (!) 161/71  Pulse: 67 64  Resp: 18 14  Temp:  36.6 C    Last Pain:  Vitals:   08/23/16 0946  TempSrc:   PainSc: 2                  Draper Gallon

## 2016-08-23 NOTE — Discharge Instructions (Signed)
[facility] ______[facility] LAPAROSCOPIC SURGERY: POST OP INSTRUCTIONS Always review your discharge instruction sheet given to you by the facility where your surgery was performed. IF YOU HAVE DISABILITY OR FAMILY LEAVE FORMS, YOU MUST BRING THEM TO THE OFFICE FOR PROCESSING.   DO NOT GIVE THEM TO YOUR DOCTOR.  1. A prescription for pain medication may be given to you upon discharge.  Take your pain medication as prescribed, if needed.  If narcotic pain medicine is not needed, then you may take acetaminophen (Tylenol) or ibuprofen (Advil) as needed. 2. Take your usually prescribed medications unless otherwise directed. 3. If you need a refill on your pain medication, please contact your pharmacy.  They will contact our office to request authorization. Prescriptions will not be filled after 5pm or on week-ends. 4. You should follow a light diet the first few days after arrival home, such as soup and crackers, etc.  Be sure to include lots of fluids daily. 5. Most patients will experience some swelling and bruising in the area of the incisions.  Ice packs will help.  Swelling and bruising can take several days to resolve.  6. It is common to experience some constipation if taking pain medication after surgery.  Increasing fluid intake and taking a stool softener (such as Colace) will usually help or prevent this problem from occurring.  A mild laxative (Milk of Magnesia or Miralax) should be taken according to package instructions if there are no bowel movements after 48 hours. 7. Unless discharge instructions indicate otherwise, you may remove your bandages 24-48 hours after surgery, and you may shower at that time.  You may have steri-strips (small skin tapes) in place directly over the incision.  These strips should be left on the skin for 7-10 days.  If your surgeon used skin glue on the incision, you may shower in 24 hours.  The glue will flake off over the next 2-3 weeks.  Any sutures or  staples will be removed at the office during your follow-up visit. 8. ACTIVITIES:  You may resume regular (light) daily activities beginning the next day--such as daily self-care, walking, climbing stairs--gradually increasing activities as tolerated.  You may have sexual intercourse when it is comfortable.  Refrain from any heavy lifting or straining until approved by your doctor. a. You may drive when you are no longer taking prescription pain medication, you can comfortably wear a seatbelt, and you can safely maneuver your car and apply brakes. b. RETURN TO WORK:  _n/a_________________________________________________________ 9. You should see your doctor in the office for a follow-up appointment approximately 2-3 weeks after your surgery.  Make sure that you call for this appointment within a day or two after you arrive home to insure a convenient appointment time. 10. OTHER INSTRUCTIONS: __________________________________________________________________________________________________________________________ __________________________________________________________________________________________________________________________ WHEN TO CALL YOUR DOCTOR: 1. Fever over 101.0 2. Inability to urinate 3. Continued bleeding from incision. 4. Increased pain, redness, or drainage from the incision. 5. Increasing abdominal pain  The clinic staff is available to answer your questions during regular business hours.  Please dont hesitate to call and ask to speak to one of the nurses for clinical concerns.  If you have a medical emergency, go to the nearest emergency room or call 911.  A surgeon from [facility] is always on call at the hospital. [address], [place], [place], [place]  [address] ? [address], [place], [place]   [address] [phone] ? [phone] ? FAX [phone] Web site:  [email or web address] ° °

## 2016-08-23 NOTE — Anesthesia Preprocedure Evaluation (Signed)
Anesthesia Evaluation  Patient identified by MRN, date of birth, ID band Patient awake    Reviewed: Allergy & Precautions, NPO status , Patient's Chart, lab work & pertinent test results  History of Anesthesia Complications (+) PONV  Airway Mallampati: II  TM Distance: >3 FB     Dental   Pulmonary asthma , former smoker,    breath sounds clear to auscultation       Cardiovascular hypertension, + Peripheral Vascular Disease   Rhythm:Regular Rate:Normal     Neuro/Psych    GI/Hepatic Neg liver ROS, History noted. CG   Endo/Other  diabetes  Renal/GU negative Renal ROS     Musculoskeletal  (+) Arthritis ,   Abdominal   Peds  Hematology   Anesthesia Other Findings   Reproductive/Obstetrics                             Anesthesia Physical Anesthesia Plan  ASA: III  Anesthesia Plan: General   Post-op Pain Management:    Induction: Intravenous  Airway Management Planned: Oral ETT  Additional Equipment:   Intra-op Plan:   Post-operative Plan: Extubation in OR  Informed Consent: I have reviewed the patients History and Physical, chart, labs and discussed the procedure including the risks, benefits and alternatives for the proposed anesthesia with the patient or authorized representative who has indicated his/her understanding and acceptance.   Dental advisory given  Plan Discussed with: Anesthesiologist and CRNA  Anesthesia Plan Comments:         Anesthesia Quick Evaluation

## 2016-08-23 NOTE — Anesthesia Postprocedure Evaluation (Signed)
Anesthesia Post Note  Patient: Michelle Donovan  Procedure(s) Performed: Procedure(s) (LRB): LAPAROSCOPIC CHOLECYSTECTOMY (N/A)  Patient location during evaluation: PACU Anesthesia Type: General Level of consciousness: awake Pain management: pain level controlled Vital Signs Assessment: post-procedure vital signs reviewed and stable Respiratory status: spontaneous breathing Cardiovascular status: stable Anesthetic complications: no       Last Vitals:  Vitals:   08/23/16 0935 08/23/16 0946  BP: (!) 141/78 (!) 161/71  Pulse: 67 64  Resp: 18 14  Temp:  36.6 C    Last Pain:  Vitals:   08/23/16 0946  TempSrc:   PainSc: 2                  Jamine Wingate

## 2016-08-23 NOTE — Transfer of Care (Signed)
Immediate Anesthesia Transfer of Care Note  Patient: Michelle Donovan  Procedure(s) Performed: Procedure(s): LAPAROSCOPIC CHOLECYSTECTOMY (N/A)  Patient Location: PACU  Anesthesia Type:General  Level of Consciousness: awake, alert , oriented, patient cooperative and responds to stimulation  Airway & Oxygen Therapy: Patient Spontanous Breathing and Patient connected to face mask oxygen  Post-op Assessment: Report given to RN, Post -op Vital signs reviewed and stable and Patient moving all extremities X 4  Post vital signs: Reviewed and stable  Last Vitals:  Vitals:   08/23/16 0638 08/23/16 0900  BP: 139/68   Pulse: 60   Resp: 20   Temp: 36.6 C 36.2 C    Last Pain:  Vitals:   08/23/16 0649  TempSrc:   PainSc: 4       Patients Stated Pain Goal: 2 (83/81/84 0375)  Complications: No apparent anesthesia complications

## 2016-08-23 NOTE — Addendum Note (Signed)
Addendum  created 08/23/16 1359 by Glynda Jaeger, CRNA   Anesthesia Event edited

## 2016-08-23 NOTE — Anesthesia Procedure Notes (Signed)
Procedure Name: Intubation Date/Time: 08/23/2016 7:40 AM Performed by: Tressia Miners LEFFEW Pre-anesthesia Checklist: Patient identified, Patient being monitored, Timeout performed, Emergency Drugs available and Suction available Patient Re-evaluated:Patient Re-evaluated prior to inductionOxygen Delivery Method: Circle System Utilized Preoxygenation: Pre-oxygenation with 100% oxygen Intubation Type: IV induction Ventilation: Mask ventilation without difficulty Laryngoscope Size: Glidescope and 4 Grade View: Grade I Tube type: Oral Tube size: 7.5 mm Number of attempts: 1 Airway Equipment and Method: Video-laryngoscopy and Rigid stylet Placement Confirmation: ETT inserted through vocal cords under direct vision,  positive ETCO2 and breath sounds checked- equal and bilateral Secured at: 23 cm Tube secured with: Tape Dental Injury: Teeth and Oropharynx as per pre-operative assessment  Comments: Glidescope used to protect metal screws that hold dentures in place

## 2016-08-23 NOTE — H&P (View-Only) (Signed)
Michelle Donovan 07/29/2016 8:56 AM Location: [facility] Patient #: [id number] DOB: 1946/05/10 Divorced / Language: Michelle Donovan / Race: White Female  History of Present Illness (Michelle Donovan A. Michelle Heller MD; 07/29/2016 9:33 AM) Patient words: This is a very nice 71 year old woman who is referred for evaluation for gallstones. She was found to have mildly elevated LFTs when her labs are being checked by her neurologist for monitoring while on medications for her chronic tremor. She was noted to have mildly elevated AST and ALT, and on subsequent ultrasound, both fatty liver and gallstones were noted. The liver enzymes have trended down since that evaluation but are still mildly elevated. She has some vague symptoms of biliary colic, she does describe some right upper quadrant pain radiating around to her back, significant reflux as well. She did have one episode where the pain radiated into her chest, she thought she was having a heart attack and went to the emergency department-that was actually the first time she was noted to have stones. She is interested in cholecystectomy.  Her surgical history is noted for laparoscopic hysterectomy completed by bladder injury, and subsequent laparoscopic unilateral salpingo-oophorectomy for the retained ovary that had subsequent leak caused her to be septic for some reason. Prior to all that she had a tubal ligation.  Her medical history does include lung nodules which are thought to be benign and stable, but no other cardiopulmonary disease. She has a tremor for which she takes Michelle Donovan and Michelle Donovan as well as Michelle Donovan.  The patient is a 71 year old female.   Past Surgical History Michelle Donovan, [place]; 07/29/2016 8:56 AM) Colon Polyp Removal - Colonoscopy Hysterectomy (not due to cancer) - Complete Tonsillectomy  Diagnostic Studies History Michelle Donovan, [place]; 07/29/2016 8:56 AM) Colonoscopy >10 years ago Mammogram 1-3 years ago Pap Smear >5 years  ago  Allergies Michelle Donovan, Michelle Donovan; 07/29/2016 8:58 AM) Codeine Phosphate *ANALGESICS - OPIOID* Nausea and Vomiting Topamax *ANTICONVULSANTS* Hair loss Allergies Reconciled  Medication History Michelle Donovan, Michelle Donovan; 07/29/2016 8:58 AM) ClonazePAM (0.5MG  Tablet, Oral daily) Active. Montelukast Sodium (10MG  Tablet, Oral daily) Active. Michelle Donovan HCl ER (120MG  Capsule ER 24HR, Oral daily) Active. ValACYclovir HCl (1GM Tablet, Oral daily) Active. Trihexyphenidyl HCl (2MG  Tablet, Oral daily) Active. Medications Reconciled  Social History Michelle Donovan, [place]; 07/29/2016 8:56 AM) Alcohol use Remotely quit alcohol use. Caffeine use Carbonated beverages, Coffee, Tea. No drug use Tobacco use Former smoker.  Family History Michelle Donovan, [place]; 07/29/2016 8:56 AM) Alcohol Abuse Michelle Donovan. Cerebrovascular Accident Michelle Donovan. Depression Michelle Donovan. Diabetes Mellitus Michelle Donovan, Michelle Donovan. Heart Disease Michelle Donovan, Michelle Donovan. Heart disease in female family member before age 59 Hypertension Michelle Donovan. Melanoma Michelle Donovan. Respiratory Condition Michelle Donovan. Thyroid problems Michelle Donovan.  Pregnancy / Birth History Michelle Donovan, [place]; 07/29/2016 8:56 AM) Age at menarche 48 years. Age of menopause 51-55 Contraceptive History Oral contraceptives. Gravida 1 Irregular periods Maternal age 4-35 Para 1  Other Problems Michelle Donovan, [place]; 07/29/2016 8:56 AM) Asthma Back Pain Chest pain Cholelithiasis General anesthesia - complications Hemorrhoids High blood pressure Hypercholesterolemia Oophorectomy Bilateral. Thyroid Disease     Review of Systems Michelle Donovan Michelle Donovan; 07/29/2016 8:56 AM) General Present- Fatigue, Night Sweats and Weight Gain. Not Present- Appetite Loss, Chills, Fever and Weight Loss. Skin Present- Dryness and New Lesions. Not Present- Change in Wart/Mole, Hives, Jaundice, Non-Healing Wounds, Rash and Ulcer. HEENT Present- Ringing in the Ears and Wears  glasses/contact lenses. Not Present- Earache, Hearing Loss, Hoarseness, Nose Bleed, Oral Ulcers, Seasonal Allergies, Sinus Pain, Sore Throat, Visual Disturbances and Yellow Eyes. Respiratory Present- Chronic  Cough. Not Present- Bloody sputum, Difficulty Breathing, Snoring and Wheezing. Breast Not Present- Breast Mass, Breast Pain, Nipple Discharge and Skin Changes. Cardiovascular Present- Leg Cramps, Palpitations and Swelling of Extremities. Not Present- Chest Pain, Difficulty Breathing Lying Down, Rapid Heart Rate and Shortness of Breath. Gastrointestinal Present- Bloating, Constipation, Difficulty Swallowing, Excessive gas, Hemorrhoids and Indigestion. Not Present- Abdominal Pain, Bloody Stool, Change in Bowel Habits, Chronic diarrhea, Gets full quickly at meals, Nausea, Rectal Pain and Vomiting. Female Genitourinary Not Present- Frequency, Nocturia, Painful Urination, Pelvic Pain and Urgency. Musculoskeletal Present- Back Pain, Joint Pain, Joint Stiffness, Muscle Pain and Muscle Weakness. Not Present- Swelling of Extremities. Neurological Present- Tremor. Not Present- Decreased Memory, Fainting, Headaches, Numbness, Seizures, Tingling, Trouble walking and Weakness. Psychiatric Not Present- Anxiety, Bipolar, Change in Sleep Pattern, Depression, Fearful and Frequent crying. Endocrine Present- Cold Intolerance and Hot flashes. Not Present- Excessive Hunger, Hair Changes, Heat Intolerance and New Diabetes. Hematology Not Present- Blood Thinners, Easy Bruising, Excessive bleeding, Gland problems, HIV and Persistent Infections.  Vitals Michelle Donovan Michelle Donovan; 07/29/2016 8:58 AM) 07/29/2016 8:58 AM Weight: 155.8 lb Height: 65in Body Surface Area: 1.78 m Body Mass Index: 25.93 kg/m  Temp.: 32F  Pulse: 65 (Regular)  BP: 118/62 (Sitting, Left Arm, Standard)      Physical Exam (Michelle Donovan A. Michelle Heller MD; 07/29/2016 9:35 AM)  General Note: She is alert and oriented, appears stated  age  Integumentary Note: No lesions or rashes on limited skin exam  Head and Neck Note: No mass or thyromegaly, but she endorses history of thyroid nodules  Eye Note: Anicteric, ocular motions intact  ENMT Note: Moist mucous membranes, good dentition  Chest and Lung Exam Note: Unlabored respirations, symmetrical air entry  Cardiovascular Note: Regular rate and rhythm, no pedal edema  Abdomen Note: Centrally obese, nontender nondistended. Reducible incisional hernia at the umbilicus. No mass or organomegaly palpable  Neurologic Note: Grossly intact, normal gait  Neuropsychiatric Note: Normal mood and affect, mildly tremulous, appropriate insight  Musculoskeletal Note: Strength symmetrical throughout, no deformity    Assessment & Plan (Harvey Matlack A. Michelle Heller MD; 07/29/2016 2:42 AM)  BILIARY COLIC (P53.61) Story: We discussed laparoscopic cholecystectomy. We discussed that this is indicated due to the combination of stones and symptoms. We discussed that her elevated LFTs are more likely attributable to the fatty liver changes noted on ultrasound. She does endorse poor dietary choices including a lot of fast food. We discussed the importance of changing that pattern. We discussed the nature of gallbladder surgery, leaks with good recovery, and the risks involved including pain, bleeding, scarring, injury to adjacent structures specifically the common bile duct and sequelae, and conversion to open surgery. She expressed understanding and asked appropriate questions. We will get her scheduled at her earliest convenience.

## 2016-08-23 NOTE — Interval H&P Note (Signed)
History and Physical Interval Note:  08/23/2016 7:10 AM  Michelle Donovan  has presented today for surgery, with the diagnosis of Biliary colic  The various methods of treatment have been discussed with the patient and family. After consideration of risks, benefits and other options for treatment, the patient has consented to  Procedure(s): LAPAROSCOPIC CHOLECYSTECTOMY (N/A) as a surgical intervention .  The patient's history has been reviewed, patient examined, no change in status, stable for surgery.  I have reviewed the patient's chart and labs.  Questions were answered to the patient's satisfaction.  Will plan for possible umbilical hernia repair at the end of the procedure per patient's request.    Clovis Riley

## 2016-08-24 ENCOUNTER — Encounter (HOSPITAL_COMMUNITY): Payer: Self-pay | Admitting: Surgery

## 2016-09-14 ENCOUNTER — Telehealth: Payer: Self-pay

## 2016-09-14 ENCOUNTER — Other Ambulatory Visit: Payer: Self-pay

## 2016-09-14 ENCOUNTER — Ambulatory Visit (INDEPENDENT_AMBULATORY_CARE_PROVIDER_SITE_OTHER): Payer: PPO | Admitting: Gastroenterology

## 2016-09-14 ENCOUNTER — Encounter: Payer: Self-pay | Admitting: Gastroenterology

## 2016-09-14 ENCOUNTER — Other Ambulatory Visit
Admission: RE | Admit: 2016-09-14 | Discharge: 2016-09-14 | Disposition: A | Discharge status: Home / Self Care | Payer: PPO | Source: Ambulatory Visit | Attending: Gastroenterology | Admitting: Gastroenterology

## 2016-09-14 VITALS — BP 113/68 | HR 58 | Temp 98.0°F | Ht 65.0 in | Wt 158.8 lb

## 2016-09-14 DIAGNOSIS — R7989 Other specified abnormal findings of blood chemistry: Secondary | ICD-10-CM | POA: Insufficient documentation

## 2016-09-14 DIAGNOSIS — R945 Abnormal results of liver function studies: Secondary | ICD-10-CM | POA: Diagnosis not present

## 2016-09-14 LAB — IRON AND TIBC
Iron: 50 ug/dL (ref 28–170)
Saturation Ratios: 15 % (ref 10.4–31.8)
TIBC: 335 ug/dL (ref 250–450)
UIBC: 285 ug/dL

## 2016-09-14 LAB — HEPATIC FUNCTION PANEL
ALK PHOS: 120 U/L (ref 38–126)
ALT: 101 U/L — AB (ref 14–54)
AST: 121 U/L — ABNORMAL HIGH (ref 15–41)
Albumin: 3.7 g/dL (ref 3.5–5.0)
Bilirubin, Direct: <0.1 mg/dL — ABNORMAL LOW (ref 0.1–0.5)
TOTAL PROTEIN: 7.3 g/dL (ref 6.5–8.1)
Total Bilirubin: 0.3 mg/dL (ref 0.3–1.2)

## 2016-09-14 LAB — GAMMA GT: GGT: 34 U/L (ref 7–50)

## 2016-09-14 LAB — FERRITIN: FERRITIN: 422 ng/mL — AB (ref 11–307)

## 2016-09-14 NOTE — Progress Notes (Signed)
Gastroenterology Consultation  Referring Provider:     Jinny Sanders, MD Primary Care Physician:  Eliezer Lofts, MD Primary Gastroenterologist:  Dr. Jonathon Bellows  Reason for Consultation:     Abnormal LFT's, Fatty liver         HPI:   Michelle Donovan is a 71 y.o. y/o female referred for consultation & management  by Dr. Eliezer Lofts, MD.   She has been referred for fatty liver and elevated LFT's.  She underwent a recent laparoscopic cholecystectomy on 08/23/16 . She carries a history of raised LFT's attributed to fatty liver. H/o of consuming a lot of fast food.   RUQ USG 07/2016 showed fatty infiltration of the liver .  She says she has had abnormal LFT's for some years. She has gained weight over the years, after her age of 80 gained more weight . She has a family history of central obesity . Grandmother was diabetic. Michelle Donovan is not diabtic but she says the last time numbers "were slightly up ", brother is diabetic. She takes propronolol for tremors but says helps her BP, she does have high cholesterol. "I eat out all the time", [employer], [employer] , [employer], she lives by herself and does not cook for herself. She pretty much eats every day .  She drinks a glass of pepsi each morning , in addition takes a 32 0z cup every day and then drinks it further after she gets home.   Never had a blood transfusion, no tatoos, no [employer], no illegal drug use. She does not smoke anymore, quit in 1982. No alcohol. No family history of liver disease.      Hepatic Function Latest Ref Rng & Units 07/16/2016 07/08/2016 01/26/2016  Total Protein 6.1 - 8.1 g/dL 6.7 7.0 6.9  Albumin 3.6 - 5.1 g/dL 3.8 3.9 3.8  AST 10 - 35 U/L 108(H) 103(H) 61(H)  ALT 6 - 29 U/L 95(H) 113(H) 79(H)  Alk Phosphatase 33 - 130 U/L 111 117 99  Total Bilirubin 0.2 - 1.2 mg/dL 0.4 0.4 0.5  Bilirubin, Direct <=0.2 mg/dL 0.1 - -    GGT 07/16/16 was normal.    Body mass index is 26.43 kg/m.   Past Medical History:    Diagnosis Date  . Allergic rhinitis, cause unspecified   . Asthma    not bad per pt  . Dermatophytosis of scalp and beard   . Diabetes mellitus without complication ([place])    history of no longer a problem  . Diverticulosis   . Essential and other specified forms of tremor   . HTN (hypertension)   . Irritable bowel syndrome   . Lumbago    bulging disk per MRI   . Mixed hyperlipidemia   . Other acute reactions to stress   . Other diseases of lung, not elsewhere classified    solitary pulm. nodule(left)  . PONV (postoperative nausea and vomiting)   . Thyroid nodule   . Unspecified asthma(493.90)   . Unspecified vitamin D deficiency     Past Surgical History:  Procedure Laterality Date  . ABDOMINAL HYSTERECTOMY    . CHOLECYSTECTOMY N/A 08/23/2016   Procedure: LAPAROSCOPIC CHOLECYSTECTOMY;  Surgeon: Clovis Riley, MD;  Location: [facility];  Service: General;  Laterality: N/A;  . COLONOSCOPY    . LAPAROSCOPIC TOTAL HYSTERECTOMY  2002  . LAPAROSCOPIC UNILATERAL SALPINGO OOPHERECTOMY    . TONSILLECTOMY  1967/68  . TUBAL LIGATION  1986    Prior to Admission medications  Medication Sig Start Date End Date Taking? Authorizing Provider  cetirizine (ZYRTEC) 10 MG tablet Take 10 mg by mouth daily.   Yes Historical Provider, MD  clonazePAM (KLONOPIN) 0.5 MG tablet Take one in the morning, half in the evening Patient taking differently: Take 0.25-0.5 mg by mouth See admin instructions. Takes 1 tablet (0.5 mg) in the morning and 1/2 tablet (0.25 mg) 06/01/16  Yes Rebecca S Tat, DO  cyanocobalamin 500 MCG tablet Take 1,000 mcg by mouth daily.   Yes Historical Provider, MD  erythromycin [facility]) ophthalmic ointment Place 1 application into the left eye 4 (four) times daily. Patient taking differently: Place 1 application into the left eye at bedtime.  08/02/16  Yes Lucille Passy, MD  montelukast (SINGULAIR) 10 MG tablet TAKE 1 TABLET BY MOUTH AT BEDTIME 07/27/16  Yes Amy Cletis Athens, MD  Multiple  Vitamin (MULTIVITAMIN WITH MINERALS) TABS tablet Take 1 tablet by mouth every 14 (fourteen) days.   Yes Historical Provider, MD  neomycin-polymyxin b-dexamethasone (MAXITROL) 3.5-10000-0.1 OINT  08/02/16  Yes Historical Provider, MD  propranolol ER (INDERAL LA) 120 MG 24 hr capsule TAKE 1 CAPSULE (120 MG TOTAL) BY MOUTH DAILY. 06/28/16  Yes Amy Cletis Athens, MD  triamcinolone cream (KENALOG) 0.1 % Apply 1 application topically 2 (two) times daily as needed (rash).  09/04/15  Yes Historical Provider, MD  trihexyphenidyl (ARTANE) 2 MG tablet TAKE 1 TABLET (2 MG TOTAL) BY MOUTH 2 (TWO) TIMES DAILY WITH A MEAL. Patient taking differently: Take 1 mg by mouth at bedtime.  05/28/16  Yes Rebecca S Tat, DO  valACYclovir (VALTREX) 1000 MG tablet Take 2 tabs every 12 hours x 1 day as needed for cold sores Patient taking differently: Take 1,000 mg by mouth 2 (two) times daily. Take 2 tabs every 12 hours x 1 day as needed for cold sores 05/25/16  Yes Jearld Fenton, NP  docusate sodium (COLACE) 100 MG capsule Take 1 capsule (100 mg total) by mouth 2 (two) times daily. Patient not taking: Reported on 09/14/2016 08/23/16 09/22/16  Clovis Riley, MD  oxyCODONE (ROXICODONE) 5 MG immediate release tablet Take 1 tablet (5 mg total) by mouth every 6 (six) hours as needed for severe pain. Patient not taking: Reported on 09/14/2016 08/23/16   Clovis Riley, MD    Family History  Problem Relation Age of Onset  . Stroke Mother   . Tremor Mother   . Diabetes Mother   . Heart failure Mother   . Other Father     Trigeminal neuralgia  . Diabetes Sister   . Coronary artery disease Sister   . Irritable bowel syndrome Sister   . Tremor Sister   . Diabetes Brother     x 3  . Coronary artery disease Brother     x 3  . Irritable bowel syndrome Brother     x 3  . Tremor Brother     x 3  . Breast cancer      Aunts and cousin  . Colon cancer Maternal Grandfather      Social History  Substance Use Topics  . Smoking status:  Former Smoker    Quit date: 05/17/1980  . Smokeless tobacco: Never Used  . Alcohol use No    Allergies as of 09/14/2016 - Review Complete 09/14/2016  Allergen Reaction Noted  . Tylenol [acetaminophen] Other (See Comments) 08/16/2016  . Codeine Nausea And Vomiting 02/06/2007  . Topamax [topiramate] Other (See Comments) 04/08/2015    Review  of Systems:    All systems reviewed and negative except where noted in HPI.   Physical Exam:  BP 113/68 (BP Location: Left Arm, Patient Position: Sitting, Cuff Size: Normal)   Pulse (!) 58   Temp 98 F (36.7 C) (Oral)   Ht 5' 5"  (1.651 m)   Wt 158 lb 12.8 oz (72 kg)   BMI 26.43 kg/m  No LMP recorded. Patient has had a hysterectomy. Psych:  Alert and cooperative. Normal mood and affect. General:   Alert,  Well-developed, well-nourished, pleasant and cooperative in NAD Head:  Normocephalic and atraumatic. Eyes:  Sclera clear, no icterus.   Conjunctiva pink. Ears:  Normal auditory acuity. Nose:  No deformity, discharge, or lesions. Mouth:  No deformity or lesions,oropharynx pink & moist. Neck:  Supple; no masses or thyromegaly. Lungs:  Respirations even and unlabored.  Clear throughout to auscultation.   No wheezes, crackles, or rhonchi. No acute distress. Heart:  Regular rate and rhythm; no murmurs, clicks, rubs, or gallops. Abdomen:  Normal bowel sounds.  No bruits.  Soft, non-tender and non-distended without masses, hepatosplenomegaly or hernias noted.  No guarding or rebound tenderness.    Msk:  Symmetrical without gross deformities. Good, equal movement & strength bilaterally. Pulses:  Normal pulses noted. Extremities:  No clubbing or edema.  No cyanosis. Neurologic:  Alert and oriented x3;  grossly normal neurologically. Psych:  Alert and cooperative. Normal mood and affect.  Imaging Studies: Chest 2 View  Result Date: 08/19/2016 CLINICAL DATA:  Preoperative evaluation, laparoscopic cholecystectomy scheduled August 23, 2016. History of  hypertension. EXAM: CHEST  2 VIEW COMPARISON:  Chest radiograph April 08, 2013 FINDINGS: Cardiomediastinal silhouette is normal. No pleural effusions or focal consolidations. Trachea projects midline and there is no pneumothorax. Soft tissue planes and included osseous structures are non-suspicious. Minimal degenerative change of the thoracic spine. IMPRESSION: Stable examination:  No acute cardiopulmonary process. Electronically Signed   By: Elon Alas M.D.   On: 08/19/2016 17:00    Assessment and Plan:   Michelle Donovan is a 71 y.o. y/o female  elevated liver function tests most likely secondary to NAFLD and metabolic syndrome.   Further labs necessary to look for viral hepatitis, autoimmune liver disease, Primary Biliary cirrhosis, celiac disease, muscle disorders,Primary Sclerosing Cholangitis, Hemachromatosis, Wilson's disease, or A-1 antitrypsin deficiency.  Her diet is significant for a large qty of fast food and sodas. Discussed in depth for over 20 mins the need to decrease sugars,fats in her diet and to avoid these foods as the cornerstone to manage her fatty liver.   Plan  1. Will check autoimmune labs, viral hepatitis labs 2. Liver electrography to determine degree of liver fibrosis 3. Hep A/B immune status 4.If all tests are negative likely she has NAFLD and will discuss health maintenance issues at next visit and future management .   Follow up in 6-8 weeks.   Dr Jonathon Bellows MD

## 2016-09-15 LAB — ALPHA-1 ANTITRYPSIN PHENOTYPE: A-1 Antitrypsin, Ser: 152 mg/dL (ref 90–200)

## 2016-09-15 LAB — HEPATITIS C ANTIBODY

## 2016-09-15 LAB — HEPATITIS A ANTIBODY, TOTAL: Hep A Total Ab: NEGATIVE

## 2016-09-15 LAB — HEPATITIS B SURFACE ANTIBODY, QUANTITATIVE: Hepatitis B-Post: <3.1 m[IU]/mL — ABNORMAL LOW (ref >9.9)

## 2016-09-15 LAB — HEPATITIS B SURFACE ANTIGEN: HEP B S AG: NEGATIVE

## 2016-09-15 LAB — CERULOPLASMIN: Ceruloplasmin: 25.7 mg/dL (ref 19.0–39.0)

## 2016-09-15 LAB — ANTI-SMOOTH MUSCLE ANTIBODY, IGG: F-Actin IgG: 20 Units — ABNORMAL HIGH (ref 0–19)

## 2016-09-15 LAB — MITOCHONDRIAL ANTIBODIES: Mitochondrial M2 Ab, IgG: 10.9 Units (ref 0.0–20.0)

## 2016-09-16 LAB — ANA W/REFLEX IF POSITIVE: Anti Nuclear Antibody(ANA): NEGATIVE

## 2016-09-16 LAB — ANTI-MICROSOMAL ANTIBODY LIVER / KIDNEY: LKM1 Ab: 2.3 Units (ref 0.0–20.0)

## 2016-09-16 NOTE — Telephone Encounter (Signed)
Spoke with Patient. 

## 2016-09-17 ENCOUNTER — Ambulatory Visit: Payer: PPO

## 2016-09-19 LAB — IMMUNOGLOBULINS A/E/G/M, SERUM
IGG (IMMUNOGLOBIN G), SERUM: 755 mg/dL (ref 700–1600)
IgA: 495 mg/dL — ABNORMAL HIGH (ref 87–352)
IgE (Immunoglobulin E), Serum: <2 IU/mL (ref 0–100)
IgM, Serum: 113 mg/dL (ref 26–217)

## 2016-09-20 ENCOUNTER — Telehealth: Payer: Self-pay

## 2016-09-20 NOTE — Telephone Encounter (Signed)
LVM for patient callback for results per Dr. Vicente Males.   Inform labs normal except mildly elevated smooth muscle antibody , suggest cutting down fast food, get hepa/b vaccine and will discuss further steps at office visit after ultrasound results

## 2016-09-20 NOTE — Telephone Encounter (Signed)
-----   Message from Jonathon Bellows, MD sent at 09/19/2016  9:08 PM EDT ----- Inform labs normal except mildly elevated smooth muscle antibody , suggest cutting down fast food, get hepa/b vaccine and will discuss further steps at office visit after ultrasound results

## 2016-09-22 ENCOUNTER — Ambulatory Visit
Admission: RE | Admit: 2016-09-22 | Discharge: 2016-09-22 | Disposition: A | Discharge status: Home / Self Care | Payer: PPO | Source: Ambulatory Visit | Attending: Gastroenterology | Admitting: Gastroenterology

## 2016-09-22 DIAGNOSIS — R945 Abnormal results of liver function studies: Secondary | ICD-10-CM

## 2016-09-22 DIAGNOSIS — R7989 Other specified abnormal findings of blood chemistry: Secondary | ICD-10-CM | POA: Diagnosis not present

## 2016-10-01 ENCOUNTER — Telehealth: Payer: Self-pay

## 2016-10-01 NOTE — Telephone Encounter (Signed)
-----   Message from Jonathon Bellows, MD sent at 09/27/2016 11:05 AM EDT ----- Inform minimal risk of fibrosis - its the best result possible .

## 2016-10-01 NOTE — Telephone Encounter (Signed)
LVM for patient results per Dr. Vicente Males.   Inform minimal risk of fibrosis - its the best result possible .

## 2016-10-22 ENCOUNTER — Other Ambulatory Visit: Payer: Self-pay | Admitting: Family Medicine

## 2016-10-26 ENCOUNTER — Other Ambulatory Visit: Payer: Self-pay

## 2016-10-26 ENCOUNTER — Ambulatory Visit: Payer: PPO | Admitting: Gastroenterology

## 2016-10-26 ENCOUNTER — Ambulatory Visit (INDEPENDENT_AMBULATORY_CARE_PROVIDER_SITE_OTHER): Payer: PPO | Admitting: Gastroenterology

## 2016-10-26 ENCOUNTER — Encounter: Payer: Self-pay | Admitting: Gastroenterology

## 2016-10-26 VITALS — BP 112/65 | HR 64 | Temp 98.1°F | Ht 65.0 in | Wt 156.8 lb

## 2016-10-26 DIAGNOSIS — R945 Abnormal results of liver function studies: Secondary | ICD-10-CM

## 2016-10-26 DIAGNOSIS — R7989 Other specified abnormal findings of blood chemistry: Secondary | ICD-10-CM

## 2016-10-26 DIAGNOSIS — K760 Fatty (change of) liver, not elsewhere classified: Secondary | ICD-10-CM

## 2016-10-26 NOTE — Progress Notes (Signed)
Jonathon Bellows MD, MRCP(U.K) [place]  [place], [place] [address]  Main: [phone]  Fax: [phone]   Primary Care Physician: Jinny Sanders, MD  Primary Gastroenterologist:  Dr. Jonathon Bellows   No chief complaint on file.   HPI: Kellin Fifer is a 71 y.o. female is here today to follow up for abnormal LFT's which I felt was most likely secondary to NAFLD.   Summary of history : She was initially seen on 09/14/16 for abnormal LFT's .RUQ USG 07/2016 showed fatty infiltration of the liver .She underwent a recent laparoscopic cholecystectomy on 08/23/16  H/o of consuming a lot of fast food. She has gained weight over the years, after her age of 60 gained more weight . She has a family history of central obesity . Grandmother was diabetic. She does have high cholesterol.Never had a blood transfusion, no tatoos, no [employer], no illegal drug use. She does not smoke anymore, quit in 1982. No alcohol. No family history of liver disease  Interval history   09/14/2016-  10/26/2016   Lab evaluation 09/14/16    USG elastrogram showed F0/F1 fibrosis indicating minimal to none.  Albumin 3.7, ALT 101, AST 121 . Not immune to Hep A. LKM ab -negative . IGG not elevated. Iron % saturation is 15 ,ANA ,AMA,Hep C ab,GGT,HbsAg  is negative. F actin weakly positive. Ceruloplasmin,A1AT normal . Not immune to hepatitis B  Current Outpatient Prescriptions  Medication Sig Dispense Refill  . cetirizine (ZYRTEC) 10 MG tablet Take 10 mg by mouth daily.    . clonazePAM (KLONOPIN) 0.5 MG tablet Take one in the morning, half in the evening (Patient taking differently: Take 0.25-0.5 mg by mouth See admin instructions. Takes 1 tablet (0.5 mg) in the morning and 1/2 tablet (0.25 mg)) 45 tablet 1  . cyanocobalamin 500 MCG tablet Take 1,000 mcg by mouth daily.    Marland Kitchen erythromycin [facility]) ophthalmic ointment Place 1 application into the left eye 4 (four) times daily. (Patient taking differently:  Place 1 application into the left eye at bedtime. ) 3.5 g 0  . montelukast (SINGULAIR) 10 MG tablet TAKE 1 TABLET BY MOUTH AT BEDTIME 90 tablet 0  . Multiple Vitamin (MULTIVITAMIN WITH MINERALS) TABS tablet Take 1 tablet by mouth every 14 (fourteen) days.    Marland Kitchen neomycin-polymyxin b-dexamethasone (MAXITROL) 3.5-10000-0.1 OINT     . oxyCODONE (ROXICODONE) 5 MG immediate release tablet Take 1 tablet (5 mg total) by mouth every 6 (six) hours as needed for severe pain. (Patient not taking: Reported on 09/14/2016) 30 tablet 0  . propranolol ER (INDERAL LA) 120 MG 24 hr capsule TAKE 1 CAPSULE (120 MG TOTAL) BY MOUTH DAILY. 90 capsule 1  . triamcinolone cream (KENALOG) 0.1 % Apply 1 application topically 2 (two) times daily as needed (rash).   1  . trihexyphenidyl (ARTANE) 2 MG tablet TAKE 1 TABLET (2 MG TOTAL) BY MOUTH 2 (TWO) TIMES DAILY WITH A MEAL. (Patient taking differently: Take 1 mg by mouth at bedtime. ) 60 tablet 0  . valACYclovir (VALTREX) 1000 MG tablet Take 2 tabs every 12 hours x 1 day as needed for cold sores (Patient taking differently: Take 1,000 mg by mouth 2 (two) times daily. Take 2 tabs every 12 hours x 1 day as needed for cold sores) 20 tablet 0   No current facility-administered medications for this visit.     Allergies as of 10/26/2016 - Review Complete 09/14/2016  Allergen Reaction Noted  .  Tylenol [acetaminophen] Other (See Comments) 08/16/2016  . Codeine Nausea And Vomiting 02/06/2007  . Topamax [topiramate] Other (See Comments) 04/08/2015    ROS:  General: Negative for anorexia, weight loss, fever, chills, fatigue, weakness. ENT: Negative for hoarseness, difficulty swallowing , nasal congestion. CV: Negative for chest pain, angina, palpitations, dyspnea on exertion, peripheral edema.  Respiratory: Negative for dyspnea at rest, dyspnea on exertion, cough, sputum, wheezing.  GI: See history of present illness. GU:  Negative for dysuria, hematuria, urinary incontinence,  urinary frequency, nocturnal urination.  Endo: Negative for unusual weight change.    Physical Examination:   There were no vitals taken for this visit.  General: Well-nourished, well-developed in no acute distress.  Eyes: No icterus. Conjunctivae pink. Mouth: Oropharyngeal mucosa moist and pink , no lesions erythema or exudate. Lungs: Clear to auscultation bilaterally. Non-labored. Heart: Regular rate and rhythm, no murmurs rubs or gallops.  Abdomen: Bowel sounds are normal, nontender, nondistended, no hepatosplenomegaly or masses, no abdominal bruits or hernia , no rebound or guarding.   Extremities: No lower extremity edema. No clubbing or deformities. Neuro: Alert and oriented x 3.  Grossly intact. Skin: Warm and dry, no jaundice.   Psych: Alert and cooperative, normal mood and affect.    Imaging Studies: No results found.  Assessment and Plan:   Kaylor Maiers is a 71 y.o. y/o female here to follow up for abnormal LFT's. Most of her labs have returned normal except a smooth muscle antibody which is mildly positive at 20 . Explained it is very likely all of her abnormal LFt's are due to NAFLD but at times can co exist with autoimmune hepatitis and only way to know if she has it is to undergo a liver biopsy which she is presently not keen . I wil repeat LFT's in a few weeks and if continues to rise then she will re consider.    Plan  1. Refer to dietician to help lose weight and change her diet  2. Cut down on fast food 3. Stop all sodas 4. Repeat LFT;s in 6 weeks with hepatitis B serology which wasn't done previously.   Dr Jonathon Bellows  MD,MRCP [facility]) Follow up in 8 weeks

## 2016-11-05 ENCOUNTER — Ambulatory Visit: Payer: PPO | Admitting: Neurology

## 2016-11-12 ENCOUNTER — Encounter: Payer: Self-pay | Admitting: Dietician

## 2016-11-12 ENCOUNTER — Encounter: Payer: PPO | Attending: Gastroenterology | Admitting: Dietician

## 2016-11-12 VITALS — Ht 65.0 in | Wt 156.8 lb

## 2016-11-12 DIAGNOSIS — Z713 Dietary counseling and surveillance: Secondary | ICD-10-CM | POA: Diagnosis not present

## 2016-11-12 DIAGNOSIS — K760 Fatty (change of) liver, not elsewhere classified: Secondary | ICD-10-CM | POA: Diagnosis not present

## 2016-11-12 DIAGNOSIS — Z6826 Body mass index (BMI) 26.0-26.9, adult: Secondary | ICD-10-CM | POA: Insufficient documentation

## 2016-11-12 NOTE — Progress Notes (Signed)
Medical Nutrition Therapy: Visit start time: 1500  end time: 1600  Assessment:  Diagnosis: non-alcoholic fatty liver disease Past medical history: Vit D deficiency, HLD, anxiety, IBS, HTN, Vit B12 deficiency, see chart Psychosocial issues/ stress concerns: none Preferred learning method:  Public house manager . Hands-on  Current weight: 156lb  Height: 5\' 5"  Medications, supplements: Vit B12, Vit D3, klonopin, see chart for full list  Progress and evaluation: Patient would like to discuss dietary interventions for slowing progression of fatty liver disease and lowering cholesterol. Goal wt 130-135#, currently approx. 20# abovegoal wt. Per patient report she was last at her goal wt 53yrs ago. Interventions to date include trying to cut back on soda and fast food consumption. Does not fry food if cooking at home. Loves cheese though she is aware that it is high in cholesterol. Would rather cut down portions or eat items less often than eat a low/reduced fat version. Does not eat many protein sources and finds it difficult to enough drink water. Does not cook often and likes food that is quick and convenient. Agreeable to incorporating more beans and legumes into her diet along with a new breakfast item like grits or smoothies. Considering investing in an Instant Pot and/or blender to help make healthy eating quick and easy.  Physical activity: not at this time  Dietary Intake:  Usual eating pattern includes 1-3 meals and 1-2 snacks per day. Dining out frequency: 5 meals per week.  Breakfast: Does not eat breakfast every day. May have cheese toast or hot dog bun with melted cheese. Recently has been trying honeynut cheerios with 2% milk Snack: PB crackers Lunch: Sometimes just coffee with creamer, other days: fast food burgers/ nuggets and fries ([employer], [employer], [employer]) or a sandwich (banana, pimento cheese) Snack: n/a Supper: Won't eat if she has a full meal at lunch. Cereal, salads recently  (cucumbers, croutons, cherry tomatoes, ranch dressing), sandwich Snack: Not usually. Snack size candy bars, cookies, occasionally potato chips Beverages: Coffee, Propel, sodas (Sprite Zero and other diet versions lately)  Nutrition Care Education: Topics covered: increasing physical activity, avoiding or limiting sugar sweetened beverages, weight loss, fiber and cholesterol, quick and healthy meals, how to buy frozen and canned fruits/ vegetables, fried vs baked or grilled meats, how to incorporate more protein into the diet Basic nutrition: basic food groups, appropriate nutrient balance, appropriate meal and snack schedule, general nutrition guidelines    Weight control: benefits of weight control, behavioral changes for weight loss Advanced nutrition: cooking techniques, dining out, food label reading Hyperlipidemia:  target goals for lipids, healthy and unhealthy fats, role of fiber food sources of Vitamin B12 Other lifestyle changes:  benefits of making changes, increasing motivation, readiness for change, identifying habits that need to change   Nutritional Diagnosis:  NI-5.6.3 Inappropriate intake of fats (specify): saturated and trans As related to dietary habits.  As evidenced by dx of non-alcoholic fatty liver disease and patient's diet recall.  Intervention: Discussion as noted above. Patient worked with RD to create nutrition-related goals which will help to both reduce her cholesterol and help slow the progression of non-alcoholic fatty liver disease. Nutrition education on these topics was provided along with supplemental educational material.  Education Materials given:  Marland Kitchen Quick and Healthy Meal Ideas . Goals/ instructions . Hypercholesterolemia Nutrition Therapy  Learner/ who was taught:  . Patient  Level of understanding: Marland Kitchen Verbalizes/ demonstrates competency  Demonstrated degree of understanding via:   Teach back Learning barriers: . None  Willingness to learn/  readiness for change: . Eager, change in progress  Monitoring and Evaluation:  Dietary intake, exercise, cholesterol-related labs, and body weight      follow up: prn

## 2016-11-12 NOTE — Patient Instructions (Addendum)
-   Continue to cut down on soda and other sugar sweetened beverages.  - Consider investing in an instant pot or blender to help make heathy eating easier.  - Buy frozen vegetables or fruit to make preparation quick and help keep fresh food available  - Practice following the guidelines for cholesterol lowering food recommendations. Practice portion control when eating cheese or other foods high in cholesterol  - Increase fluid and fiber intake each day.  - When out to eat, try ordering a smaller side or choose a non-fried option such as yogurt, a vegetable, side salad or fruit  - Continue to cook in canola oil, great choice!

## 2016-11-17 NOTE — Progress Notes (Signed)
Subjective:    Michelle Donovan was seen in consultation in the movement disorder clinic at the request of Jinny Sanders, MD.  The evaluation is for tremor.  Pt has previously seen Dr. Leta Baptist and notes from 2012 were reviewed.  Pt reports that this was just a one time consultation in 2012.   The patient is a 71 y.o. right handed female with a history of tremor.   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  She states that it is in both hands but R is worse than the L (but she uses the right more). There is a family hx of tremor in her mother and brother.  She may use xanax very if going out (less than one time per month) to suppress tremor.    Affected by caffeine:  yes(drinks 8 oz coffee per da) Affected by alcohol:  unknown Affected by stress:  yes Affected by fatigue:  no Spills soup if on spoon:  yes (uses weighted utensils at home for this) Spills glass of liquid if full:  yes Affects ADL's (tying shoes, brushing teeth, etc):  no except trouble with eyeliner  11/13/13 update:  The patient returns today for follow up.  She was restarted on topamax last visit.  She was doing markedly better when it was first started but she has recently had some thyroid issues (multinodular goiter), pink eye, bilateral frozen shoulders and URI and tremor got worse during this.  She has been worked up for elevated liver enzymes but then repeat enzymes were normal and hepatitis panel was normal.  She had some paresthesias with the topamax on initiation.  She has lost some weight; no longer able to drink her coke as it tastes poorly with the topamax.    02/14/14 update:  Pt returns today for f/u.  Noted hair loss and called here and I d/c topamax 2 days ago.   Originally thought that hair loss due to highlights had put in hair over the summer but then realized that hair loss continued.   Pt frustrated as knew that topamax worked for tremor.   Has noted chin tremor.  Restarted back to work.    05/20/14 update:  Pt has a hx of ET.  Tried to d/c the topamax because of hair loss and start neurontin but tremor was not well controlled on neurontin alone and she really wanted wanted to go back on the topamax, knowing the risks.  She states that it isn't really falling out like it was but it isn't coming back like she hoped either.  She states that tremor isn't well controlled either.  She states that she wants to stay on the gabapentin because it is helping the frozen shoulder.  She is feeling depressed because of the tremor.  Asks if her thyroid nodules could be causing the tremor.  Her mother had a goiter and also had tremor.   02/18/15 update:  The patient presents today for follow-up.  I have not seen her since January, 2016.  She ended up wanting to increase the dose of Topamax, which she did to a total of 200 mg per day.  She sent me an email recently stating that tremor was less well controlled and she was having more hair loss.  She wanted to consider other options.  She admits that stress has increased greatly; her daughter and grandchild have moved back into her home.  She  reports that her daughter makes things very stressful for her.   She is noting tremor with eating, drinking, putting on makeup, writing.   She was on gabapentin for her shoulder pain but she has d/c it now.  She is on propranolol LA 120 mg daily for blood pressure.  We have talked about Artane in the past, but she has not wanted to try that.  She has tried only 1 pill of primidone in the past and it sounds like she had first dose effect and has not wanted to retry that.    05/02/15 update:  The patient is following up today regarding her essential tremor.  She is on Inderal LA, 120 mg daily.  Last visit, we cautiously start a low-dose clonazepam 0.5 mg, 1/2 tablet in the morning and half tablet at night. She states that the medication may be helping but not enough.  Fortunately,  it has not made her sleepy.   Her Topamax was discontinued because of side effects of hair loss. Still very stressed over living situation with daughter/grandson living in home.  Doesn't know what she will be doing for Christmas if anything and seems sad about that.  Her daughter is gone right now and her side of the family is going to [place] for christmas.  08/01/15 update:  The patient is following up today regarding her essential tremor.  She is on Inderal LA, 120 mg daily.  Last visit, we cautiously increase her clonazepam 0.5 mg, so that she is taking 1 tablet in the morning and half a tablet at night.  Unfortunately, despite my urging, she has refused to try primidone, because she had a first dose effect in the past when she took one tablet.  She states today that tremor has not been well controlled; she has occasional good days but they are few and far between.  11/04/15 update:  The patient is following up today regarding her essential tremor.  She is on Inderal LA which was increased last visit to 160 mg daily.  She went down to 120 mg daily because she was sleepy and she didn't think that it was helping much and asked me in an email about focused u/s.  I told her that insurance doesn't pay for this right now even though it was FDA approved.  She remains on clonazepam 0.5 mg, so that she is taking 1 tablet in the morning and half a tablet at night.  Unfortunately, despite my urging, she has refused to try primidone, because she had a first dose effect in the past when she took one tablet.  She states today that she continues to have tremor.  She is on prednisone currently because of "inflammation" in the top of the L foot.  She doesn't think that prednisone has changed tremor.   07/08/16 update:  Patient follows up today.  I have not seen her in about 8 months.  I did review her records since last visit.  I cautiously tried her on Artane last visit.  She emailed me in July to state that she thought that  the medication made her tremor worse.  It turns out that when she started the Artane, she stopped her clonazepam, which was not the intention, and is likely the reason that tremor increased.  I asked her to start the clonazepam again, 0.5 mg, one tablet in the morning and half a tablet at night.  I offered her an opinion at an academic center but she declined that.  She states that she cut back on the artane to 1/2 tablet (of the 2mg ) at night because of some dizziness and it seems to be helping some.  She is still on the Inderal LA, 120 g daily.  She denies hallucinations.  She denies falls.  She denies lightheadedness or near syncope.  11/18/16 update:  Pt seen in f/u.   Is on artane 1mg  qhs, inderal LA 120 mg and klonopin 0.5 mg, 1 in the AM and 1/2 at night. Tremor has been about the same.  Has better and worse days.   [facility] changed klonopin manufacturers and the new one isn't as good and more trouble with cutting this new one.   The records that were made available to me were reviewed.  LFTs have been elevated and evaluated.  Fatty liver and gallstones notes on u/s.  Had cholecystectomy on 08/23/16.  Saw GI in May and felt that etiology was Non alcoholic fatty liver from dietary consumption of fast foods daily.  She was referred to dietician and saw her on 11/12/16.  Current/Previously tried tremor medications: topamax;  Metoprolol - felt drained ; primidone (50 mg - 1/2 at night but got first dose effect and stopped it); meclizine (? Why); on propranolol now - ? Helping now; gabapentin; artane  Current medications that may exacerbate tremor:  n/a  Outside reports reviewed: historical medical records and referral letter/letters.  Allergies  Allergen Reactions  . Tylenol [Acetaminophen] Other (See Comments)    Increased liver enzymes  . Codeine Nausea And Vomiting  . Topamax [Topiramate] Other (See Comments)    Hair loss    Current Outpatient Prescriptions on File Prior to Visit  Medication Sig  Dispense Refill  . cetirizine (ZYRTEC) 10 MG tablet Take 10 mg by mouth daily.    . clonazePAM (KLONOPIN) 0.5 MG tablet Take one in the morning, half in the evening (Patient taking differently: Take 0.25-0.5 mg by mouth See admin instructions. Takes 1 tablet (0.5 mg) in the morning and 1/2 tablet (0.25 mg)) 45 tablet 1  . cyanocobalamin 500 MCG tablet Take 1,000 mcg by mouth daily.    Marland Kitchen erythromycin [facility]) ophthalmic ointment Place 1 application into the left eye 4 (four) times daily. 3.5 g 0  . montelukast (SINGULAIR) 10 MG tablet TAKE 1 TABLET BY MOUTH AT BEDTIME 90 tablet 0  . propranolol ER (INDERAL LA) 120 MG 24 hr capsule TAKE 1 CAPSULE (120 MG TOTAL) BY MOUTH DAILY. 90 capsule 1  . trihexyphenidyl (ARTANE) 2 MG tablet TAKE 1 TABLET (2 MG TOTAL) BY MOUTH 2 (TWO) TIMES DAILY WITH A MEAL. (Patient taking differently: Take 1 mg by mouth at bedtime. ) 60 tablet 0  . valACYclovir (VALTREX) 1000 MG tablet Take 2 tabs every 12 hours x 1 day as needed for cold sores (Patient taking differently: Take 1,000 mg by mouth 2 (two) times daily. Take 2 tabs every 12 hours x 1 day as needed for cold sores) 20 tablet 0   No current facility-administered medications on file prior to visit.     Past Medical History:  Diagnosis Date  . Allergic rhinitis, cause unspecified   . Asthma    not bad per pt  . Dermatophytosis of scalp and beard   . Diabetes mellitus without complication ([place])    history of no longer a problem  . Diverticulosis   . Essential and other specified forms of tremor   . HTN (hypertension)   . Irritable bowel syndrome   .  Lumbago    bulging disk per MRI   . Mixed hyperlipidemia   . Other acute reactions to stress   . Other diseases of lung, not elsewhere classified    solitary pulm. nodule(left)  . PONV (postoperative nausea and vomiting)   . Thyroid nodule   . Unspecified asthma(493.90)   . Unspecified vitamin D deficiency     Past Surgical History:  Procedure Laterality  Date  . ABDOMINAL HYSTERECTOMY    . CHOLECYSTECTOMY N/A 08/23/2016   Procedure: LAPAROSCOPIC CHOLECYSTECTOMY;  Surgeon: Clovis Riley, MD;  Location: [facility];  Service: General;  Laterality: N/A;  . COLONOSCOPY    . LAPAROSCOPIC TOTAL HYSTERECTOMY  2002  . LAPAROSCOPIC UNILATERAL SALPINGO OOPHERECTOMY    . TONSILLECTOMY  1967/68  . TUBAL LIGATION  1986    Social History   Social History  . Marital status: Divorced    Spouse name: N/A  . Number of children: 1  . Years of education: N/A   Occupational History  . retired Other    [place]  .  Retired  . part time [place] History Main Topics  . Smoking status: Former Smoker    Quit date: 05/17/1980  . Smokeless tobacco: Never Used  . Alcohol use No  . Drug use: No  . Sexual activity: No   Other Topics Concern  . 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 Mother Deceased at age 37       CHF, Essential Tremor  . Father Deceased       old age, heart disease  . Brother Alive       diabetes  . Brother Alive       COPD, heart disease  . Brother Alive       Benign Essential Tremor  . Sister Alive       healthy  . Daughter Alive       anxiety  . Sister (Not Specified)  . Sister (Not Specified)  . Sister (Not Specified)  . Sister (Not Specified)  . Brother (Not Specified)  . Brother (Not Specified)  . Brother (Not Specified)  . Brother (Not Specified)  . Unknown (Not Specified)  . MGF (Not Specified)    Review of Systems A complete 10 system ROS was obtained and was negative apart from what is mentioned.   Objective:   VITALS:   Vitals:   11/18/16 1455  BP: (!) 156/80  Pulse: 64  SpO2: 98%  Weight: 156 lb (70.8 kg)  Height: 5\' 5"  (1.651 m)   Wt Readings from Last 3 Encounters:  11/18/16 156 lb (70.8 kg)    11/12/16 156 lb 12.8 oz (71.1 kg)  10/26/16 156 lb 12.8 oz (71.1 kg)    Gen:  Appears stated age and in NAD.  Flat affect today  HEENT:  Normocephalic, atraumatic. The mucous membranes are moist. The superficial temporal arteries are without ropiness or tenderness. Cardiovascular: Regular rate and rhythm. Lungs: Clear to auscultation bilaterally. Neck: There are no carotid bruits noted bilaterally.  NEUROLOGICAL:  Orientation:  The patient is alert and oriented x 3.   Cranial nerves: There is good facial symmetry.  Speech  is fluent and clear. Soft palate rises symmetrically and there is no tongue deviation. Hearing is intact to conversational tone. Tone: Tone is good throughout. Sensation: Sensation is intact to light touch throughout. Coordination:  The patient has no dysdiadichokinesia or dysmetria. Motor: Strength is 5/5 in the bilateral upper and lower extremities.  Shoulder shrug is equal bilaterally.  There is no pronator drift.  There are no fasciculations noted. Gait and Station: The patient is able to ambulate without difficulty.   MOVEMENT EXAM: Tremor:  There is mild tremor of the outstretched hands, right more than L.   Labs:  Lab Results  Component Value Date   TSH 0.862 02/08/2014     Chemistry      Component Value Date/Time   NA 138 08/19/2016 1518   K 3.5 08/19/2016 1518   CL 103 08/19/2016 1518   CO2 27 08/19/2016 1518   BUN <5 (L) 08/19/2016 1518   CREATININE 0.78 08/19/2016 1518   CREATININE 0.77 10/05/2013 1526      Component Value Date/Time   CALCIUM 9.4 08/19/2016 1518   ALKPHOS 120 09/14/2016 1444   AST 121 (H) 09/14/2016 1444   ALT 101 (H) 09/14/2016 1444   BILITOT 0.3 09/14/2016 1444        Lab Results  Component Value Date   WBC 7.5 08/19/2016   HGB 13.2 08/19/2016   HCT 41.7 08/19/2016   MCV 91.6 08/19/2016   PLT 218 08/19/2016   Lab Results  Component Value Date   TSH 0.862 02/08/2014   Lab Results  Component Value Date    VITAMINB12 383 01/26/2016   Lab Results  Component Value Date   HGBA1C 6.2 07/08/2016      Assessment/Plan:   1.  Essential Tremor.  --I had a very long discussion with the patient today.  I really would like her to retry primidone, as she only tried one pill in the past but she doesn't want to do this.  Talked about artane, cogentin (and anticholinergic SE).  She mentions that topamax was only thing that helped but had SE.  Talked to her about trokendi, which generally has less SE than the older topamax (both still topiramate).   She is doing somewhat better with artane 1mg  qhs, inderal LA 120 mg and klonopin 0.5 mg, 1 in the AM and 1/2 at night.  No cognitive changes at this point and no falls but will need to monitor closely.  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  I did tell her that this is unlikely to improve with medication therapy.  -Talked to her about surgical options, both DBS and focused u/s.  She may be DBS candidate but she doesn't want to look into that right now.    -We will check her hemoglobin A1c as fasting glucose was somewhat elevated. 2.  Mild B12 deficiency.  -back on b12 supplement 3.  Depression  -no SI/HI 4.  Non alcoholic fatty liver disease  -due to consumption of fast foods/poor diet  -just saw dietician and we also discussed proper diet 4.  Follow-up with me in the next 5 months, sooner should new neurologic issues arise.  Much greater than 50% of this visit was spent in counseling and coordinating care.  Total face to face time:  25 min

## 2016-11-18 ENCOUNTER — Ambulatory Visit (INDEPENDENT_AMBULATORY_CARE_PROVIDER_SITE_OTHER): Payer: PPO | Admitting: Neurology

## 2016-11-18 ENCOUNTER — Encounter: Payer: Self-pay | Admitting: Neurology

## 2016-11-18 VITALS — BP 156/80 | HR 64 | Ht 65.0 in | Wt 156.0 lb

## 2016-11-18 DIAGNOSIS — G250 Essential tremor: Secondary | ICD-10-CM

## 2016-11-18 DIAGNOSIS — K760 Fatty (change of) liver, not elsewhere classified: Secondary | ICD-10-CM | POA: Diagnosis not present

## 2016-11-18 DIAGNOSIS — R740 Nonspecific elevation of levels of transaminase and lactic acid dehydrogenase [LDH]: Secondary | ICD-10-CM | POA: Diagnosis not present

## 2016-11-18 DIAGNOSIS — R7401 Elevation of levels of liver transaminase levels: Secondary | ICD-10-CM

## 2016-11-18 DIAGNOSIS — E538 Deficiency of other specified B group vitamins: Secondary | ICD-10-CM

## 2016-12-15 ENCOUNTER — Other Ambulatory Visit: Payer: Self-pay | Admitting: Neurology

## 2016-12-15 MED ORDER — TRIHEXYPHENIDYL HCL 2 MG PO TABS: 2.0000 mg | ORAL_TABLET | Freq: Every day | ORAL | 3 refills | Status: DC

## 2016-12-21 ENCOUNTER — Ambulatory Visit: Payer: PPO | Admitting: Gastroenterology

## 2016-12-24 ENCOUNTER — Other Ambulatory Visit: Payer: Self-pay | Admitting: Family Medicine

## 2016-12-24 ENCOUNTER — Telehealth: Payer: Self-pay | Admitting: Family Medicine

## 2016-12-24 NOTE — Telephone Encounter (Signed)
Left pt message asking to call Ebony Hail back directly at [phone] to schedule AWV + labs with Katha Cabal and CPE with PCP.  *NOTE* Last AWV 01/23/16

## 2017-01-17 ENCOUNTER — Other Ambulatory Visit: Payer: Self-pay | Admitting: Family Medicine

## 2017-01-26 NOTE — Telephone Encounter (Signed)
Scheduled 02/23/17 °

## 2017-01-31 ENCOUNTER — Telehealth: Payer: Self-pay | Admitting: Family Medicine

## 2017-01-31 NOTE — Telephone Encounter (Signed)
Left message asking pt to call does she still need appointment for below my chart message  I get choked a lot especially when I am eating bread. My throat is a little sore and I feel like I have a knot or something in my throat that is not supposed to be there. I am wondering if the thyroid nodules are getting bigger and causing this.   ----- Message -----  From: Michelle Donovan  Sent: 01/20/2017 9:33 AM EDT  To: Michelle Pulse. Aigner  Subject: RE: Appointment Request  [name]  Can I get the reason for the visit?  Thank You   Shirlean Mylar    ----- Message -----   From: Michelle Donovan   Sent: 01/20/2017 9:15 AM EDT    To: Patient Appointment Schedule Request Mailing List  Subject: Appointment Request    Appointment Request From: Michelle Donovan    With Provider: Eliezer Lofts, MD [facility]    Preferred Date Range: From 01/20/2017 To 01/26/2017    Preferred Times: Monday Afternoon, Tuesday Afternoon, Wednesday Afternoon, Thursday Afternoon, Friday Afternoon    Reason for visit: Office Visit    Comments:

## 2017-01-31 NOTE — Telephone Encounter (Signed)
Appointment 02/01/17

## 2017-02-01 ENCOUNTER — Ambulatory Visit (INDEPENDENT_AMBULATORY_CARE_PROVIDER_SITE_OTHER): Payer: PPO | Admitting: Family Medicine

## 2017-02-01 ENCOUNTER — Encounter: Payer: Self-pay | Admitting: Family Medicine

## 2017-02-01 VITALS — BP 110/60 | HR 68 | Temp 97.8°F | Ht 65.0 in | Wt 153.2 lb

## 2017-02-01 DIAGNOSIS — R1310 Dysphagia, unspecified: Secondary | ICD-10-CM | POA: Diagnosis not present

## 2017-02-01 NOTE — Progress Notes (Signed)
   Subjective:    Patient ID: Michelle Donovan, female    DOB: 06-07-1945, 71 y.o.   MRN: [id number]  HPI  71 year old female with history of IBS, benign essential tremor presents with new onset difficulty swallowing.  Has been presents slightly for several years, now progressive in last 6 months.  No issue with liquids.  Mainly gets chokes with eating bread, pills. No pain with swallowing.  Occ has to trow up to get food out.  In last 1-2 months.. She has mild sore thorat. She feels she sounds hoarse. No heartburn, no reflux, no sour taste in throat.  She is not on any NSAIDs, asa, etc.  No new meds.  Wt Readings from Last 3 Encounters:  02/01/17 153 lb 4 oz (69.5 kg)  11/18/16 156 lb (70.8 kg)  11/12/16 156 lb 12.8 oz (71.1 kg)   GI MD was Dr. Vicente Males.   Brother with lung cancer..  Review of Systems  Constitutional: Negative for fatigue and fever.  HENT: Negative for ear pain.   Eyes: Negative for pain.  Respiratory: Negative for chest tightness and shortness of breath.   Cardiovascular: Negative for chest pain, palpitations and leg swelling.  Gastrointestinal: Negative for abdominal pain.  Genitourinary: Negative for dysuria.       Objective:   Physical Exam  Constitutional: Vital signs are normal. She appears well-developed and well-nourished. She is cooperative.  Non-toxic appearance. She does not appear ill. No distress.  HENT:  Head: Normocephalic.  Right Ear: Hearing, tympanic membrane, external ear and ear canal normal. Tympanic membrane is not erythematous, not retracted and not bulging.  Left Ear: Hearing, tympanic membrane, external ear and ear canal normal. Tympanic membrane is not erythematous, not retracted and not bulging.  Nose: No mucosal edema or rhinorrhea. Right sinus exhibits no maxillary sinus tenderness and no frontal sinus tenderness. Left sinus exhibits no maxillary sinus tenderness and no frontal sinus tenderness.  Mouth/Throat: Uvula is  midline, oropharynx is clear and moist and mucous membranes are normal.  Eyes: Pupils are equal, round, and reactive to light. Conjunctivae, EOM and lids are normal. Lids are everted and swept, no foreign bodies found.  Neck: Trachea normal and normal range of motion. Neck supple. Carotid bruit is not present. No thyroid mass and no thyromegaly present.  Cardiovascular: Normal rate, regular rhythm, S1 normal, S2 normal, normal heart sounds, intact distal pulses and normal pulses.  Exam reveals no gallop and no friction rub.   No murmur heard. Pulmonary/Chest: Effort normal and breath sounds normal. No tachypnea. No respiratory distress. She has no decreased breath sounds. She has no wheezes. She has no rhonchi. She has no rales.  Abdominal: Soft. Normal appearance and bowel sounds are normal. There is no tenderness.  Neurological: She is alert.  Skin: Skin is warm, dry and intact. No rash noted.  Psychiatric: Her speech is normal and behavior is normal. Judgment and thought content normal. Her mood appears not anxious. Cognition and memory are normal. She does not exhibit a depressed mood.  Tearful discussing loss of brother, but denies depression.          Assessment & Plan:

## 2017-02-01 NOTE — Assessment & Plan Note (Signed)
No clear symptoms of GERD, so we will not start PPI.  Discussed options swallowing study vs endoscopy eval.  She is already seeing GI.Marland Kitchen Due for follow up.  Will refer back to Gi MD for likely endoscopy evaluation to rule out stricture.

## 2017-02-01 NOTE — Patient Instructions (Addendum)
Call Dr. Georgeann Oppenheim office to set up appt for follow up LFTs and for new issue of difficulty swallowing.

## 2017-02-21 ENCOUNTER — Other Ambulatory Visit (INDEPENDENT_AMBULATORY_CARE_PROVIDER_SITE_OTHER): Payer: PPO

## 2017-02-21 ENCOUNTER — Telehealth: Payer: Self-pay | Admitting: Family Medicine

## 2017-02-21 DIAGNOSIS — R7989 Other specified abnormal findings of blood chemistry: Secondary | ICD-10-CM

## 2017-02-21 DIAGNOSIS — E559 Vitamin D deficiency, unspecified: Secondary | ICD-10-CM

## 2017-02-21 DIAGNOSIS — R945 Abnormal results of liver function studies: Secondary | ICD-10-CM

## 2017-02-21 DIAGNOSIS — E538 Deficiency of other specified B group vitamins: Secondary | ICD-10-CM

## 2017-02-21 DIAGNOSIS — E782 Mixed hyperlipidemia: Secondary | ICD-10-CM

## 2017-02-21 LAB — LIPID PANEL
CHOL/HDL RATIO: 9
Cholesterol: 272 mg/dL — ABNORMAL HIGH (ref 0–200)
HDL: 31.9 mg/dL — ABNORMAL LOW (ref >39.00)
NONHDL: 240.28
TRIGLYCERIDES: 222 mg/dL — AB (ref 0.0–149.0)
VLDL: 44.4 mg/dL — ABNORMAL HIGH (ref 0.0–40.0)

## 2017-02-21 LAB — COMPREHENSIVE METABOLIC PANEL
ALT: 30 U/L (ref 0–35)
AST: 22 U/L (ref 0–37)
Albumin: 3.8 g/dL (ref 3.5–5.2)
Alkaline Phosphatase: 107 U/L (ref 39–117)
BILIRUBIN TOTAL: 0.4 mg/dL (ref 0.2–1.2)
BUN: 7 mg/dL (ref 6–23)
CO2: 27 meq/L (ref 19–32)
Calcium: 9.1 mg/dL (ref 8.4–10.5)
Chloride: 105 mEq/L (ref 96–112)
Creatinine, Ser: 0.85 mg/dL (ref 0.40–1.20)
GFR: 70.45 mL/min (ref >60.00)
GLUCOSE: 111 mg/dL — AB (ref 70–99)
POTASSIUM: 4.1 meq/L (ref 3.5–5.1)
Sodium: 139 mEq/L (ref 135–145)
Total Protein: 6.9 g/dL (ref 6.0–8.3)

## 2017-02-21 LAB — LDL CHOLESTEROL, DIRECT: LDL DIRECT: 208 mg/dL

## 2017-02-21 LAB — VITAMIN B12: Vitamin B-12: 1299 pg/mL — ABNORMAL HIGH (ref 211–911)

## 2017-02-21 LAB — VITAMIN D 25 HYDROXY (VIT D DEFICIENCY, FRACTURES): VITD: 28.31 ng/mL — ABNORMAL LOW (ref 30.00–100.00)

## 2017-02-21 NOTE — Telephone Encounter (Signed)
-----   Message from Marchia Bond sent at 02/10/2017  1:41 PM EDT ----- Regarding: Cpx labs Mon 10/8, need orders. Thanks! :-) Please order  future cpx labs for pt's upcoming lab appt. Thanks Aniceto Boss

## 2017-02-22 ENCOUNTER — Telehealth: Payer: Self-pay | Admitting: Family Medicine

## 2017-02-22 NOTE — Telephone Encounter (Signed)
-----   Message from Eustace Pen, LPN sent at 70/11/8673 11:47 AM EDT ----- Regarding: Labs 10/10 Lab orders needed. Thank you.  Insurance: [employer]

## 2017-02-23 ENCOUNTER — Ambulatory Visit (INDEPENDENT_AMBULATORY_CARE_PROVIDER_SITE_OTHER): Payer: PPO

## 2017-02-23 VITALS — BP 100/58 | HR 50 | Temp 98.1°F | Ht 64.75 in | Wt 153.5 lb

## 2017-02-23 DIAGNOSIS — Z0000 Encounter for general adult medical examination without abnormal findings: Secondary | ICD-10-CM | POA: Diagnosis not present

## 2017-02-23 NOTE — Patient Instructions (Signed)
Michelle Donovan , Thank you for taking time to come for your Medicare Wellness Visit. I appreciate your ongoing commitment to your health goals. Please review the following plan we discussed and let me know if I can assist you in the future.   These are the goals we discussed: Goals    . Increase water intake          Starting 02/23/2017, I will attempt to drink at least 6-8 glasses of water daily.        This is a list of the screening recommended for you and due dates:  Health Maintenance  Topic Date Due  . Flu Shot  08/14/2017*  . Pneumonia vaccines (1 of 2 - PCV13) 02/01/2018*  . Mammogram  10/20/2018*  . Cologuard (Stool DNA test)  02/18/2019  . Tetanus Vaccine  11/26/2020  . DEXA scan (bone density measurement)  Completed  .  Hepatitis C: One time screening is recommended by Center for Disease Control  (CDC) for  adults born from [birth year] through 1963.   Completed  *Topic was postponed. The date shown is not the original due date.   Preventive Care for Adults  A healthy lifestyle and preventive care can promote health and wellness. Preventive health guidelines for adults include the following key practices.  . A routine yearly physical is a good way to check with your health care provider about your health and preventive screening. It is a chance to share any concerns and updates on your health and to receive a thorough exam.  . Visit your dentist for a routine exam and preventive care every 6 months. Brush your teeth twice a day and floss once a day. Good oral hygiene prevents tooth decay and gum disease.  . The frequency of eye exams is based on your age, health, family medical history, use  of contact lenses, and other factors. Follow your health care provider's ecommendations for frequency of eye exams.  . Eat a healthy diet. Foods like vegetables, fruits, whole grains, low-fat dairy products, and lean protein foods contain the nutrients you need without too many calories.  Decrease your intake of foods high in solid fats, added sugars, and salt. Eat the right amount of calories for you. Get information about a proper diet from your health care provider, if necessary.  . Regular physical exercise is one of the most important things you can do for your health. Most adults should get at least 150 minutes of moderate-intensity exercise (any activity that increases your heart rate and causes you to sweat) each week. In addition, most adults need muscle-strengthening exercises on 2 or more days a week.  Silver Sneakers may be a benefit available to you. To determine eligibility, you may visit the website: www.silversneakers.com or contact program at 512-143-4420 Mon-Fri between 8AM-8PM.   . Maintain a healthy weight. The body mass index (BMI) is a screening tool to identify possible weight problems. It provides an estimate of body fat based on height and weight. Your health care provider can find your BMI and can help you achieve or maintain a healthy weight.   For adults 20 years and older: ? A BMI below 18.5 is considered underweight. ? A BMI of 18.5 to 24.9 is normal. ? A BMI of 25 to 29.9 is considered overweight. ? A BMI of 30 and above is considered obese.   . Maintain normal blood lipids and cholesterol levels by exercising and minimizing your intake of saturated fat. Eat a balanced diet  with plenty of fruit and vegetables. Blood tests for lipids and cholesterol should begin at age 65 and be repeated every 5 years. If your lipid or cholesterol levels are high, you are over 50, or you are at high risk for heart disease, you may need your cholesterol levels checked more frequently. Ongoing high lipid and cholesterol levels should be treated with medicines if diet and exercise are not working.  . If you smoke, find out from your health care provider how to quit. If you do not use tobacco, please do not start.  . If you choose to drink alcohol, please do not consume  more than 2 drinks per day. One drink is considered to be 12 ounces (355 mL) of beer, 5 ounces (148 mL) of wine, or 1.5 ounces (44 mL) of liquor.  . If you are 101-35 years old, ask your health care provider if you should take aspirin to prevent strokes.  . Use sunscreen. Apply sunscreen liberally and repeatedly throughout the day. You should seek shade when your shadow is shorter than you. Protect yourself by wearing long sleeves, pants, a wide-brimmed hat, and sunglasses year round, whenever you are outdoors.  . Once a month, do a whole body skin exam, using a mirror to look at the skin on your back. Tell your health care provider of new moles, moles that have irregular borders, moles that are larger than a pencil eraser, or moles that have changed in shape or color.

## 2017-02-23 NOTE — Progress Notes (Signed)
Pre visit review using our clinic review tool, if applicable. No additional management support is needed unless otherwise documented below in the visit note. 

## 2017-02-23 NOTE — Progress Notes (Signed)
I reviewed health advisor's note, was available for consultation, and agree with documentation and plan.   Signed,  Ikenna Ohms T. Karlina Suares, MD  

## 2017-02-23 NOTE — Progress Notes (Signed)
Subjective:   Michelle Donovan is a 71 y.o. female who presents for Medicare Annual (Subsequent) preventive examination.  Review of Systems:  N/A Cardiac Risk Factors include: advanced age (>29men, >50 women);dyslipidemia;hypertension     Objective:     Vitals: BP (!) 100/58 (BP Location: Right Arm, Patient Position: Sitting, Cuff Size: Normal)   Pulse (!) 50   Temp 98.1 F (36.7 C) (Oral)   Ht 5' 4.75" (1.645 m) Comment: no shoes  Wt 153 lb 8 oz (69.6 kg)   SpO2 97%   BMI 25.74 kg/m   Body mass index is 25.74 kg/m.   Tobacco History  Smoking Status  . Former Smoker  . Quit date: 05/17/1980  Smokeless Tobacco  . Never Used     Counseling given: No   Past Medical History:  Diagnosis Date  . Allergic rhinitis, cause unspecified   . Asthma    not bad per pt  . Dermatophytosis of scalp and beard   . Diabetes mellitus without complication ([place])    history of no longer a problem  . Diverticulosis   . Essential and other specified forms of tremor   . HTN (hypertension)   . Irritable bowel syndrome   . Lumbago    bulging disk per MRI   . Mixed hyperlipidemia   . Other acute reactions to stress   . Other diseases of lung, not elsewhere classified    solitary pulm. nodule(left)  . PONV (postoperative nausea and vomiting)   . Thyroid nodule   . Unspecified asthma(493.90)   . Unspecified vitamin D deficiency    Past Surgical History:  Procedure Laterality Date  . ABDOMINAL HYSTERECTOMY    . CHOLECYSTECTOMY N/A 08/23/2016   Procedure: LAPAROSCOPIC CHOLECYSTECTOMY;  Surgeon: Clovis Riley, MD;  Location: [facility];  Service: General;  Laterality: N/A;  . COLONOSCOPY    . LAPAROSCOPIC TOTAL HYSTERECTOMY  2002  . LAPAROSCOPIC UNILATERAL SALPINGO OOPHERECTOMY    . TONSILLECTOMY  1967/68  . TUBAL LIGATION  1986   Family History  Problem Relation Age of Onset  . Stroke Mother   . Tremor Mother   . Diabetes Mother   . Heart failure Mother   . Other Father       Trigeminal neuralgia  . Diabetes Sister   . Coronary artery disease Sister   . Irritable bowel syndrome Sister   . Tremor Sister   . Diabetes Brother        x 3  . Coronary artery disease Brother        x 3  . Irritable bowel syndrome Brother        x 3  . Tremor Brother        x 3  . Breast cancer Unknown        Aunts and cousin  . Colon cancer Maternal Grandfather    History  Sexual Activity  . Sexual activity: No    Outpatient Encounter Prescriptions as of 02/23/2017  Medication Sig  . cetirizine (ZYRTEC) 10 MG tablet Take 10 mg by mouth daily.  . cholecalciferol (VITAMIN D) 1000 units tablet Take 1,000 Units by mouth daily.  . clonazePAM (KLONOPIN) 0.5 MG tablet TAKE 1 TAB BY MOUTH IN THE AM AND 1/2 TAB BY MOUTH AT BEDTIME  . montelukast (SINGULAIR) 10 MG tablet TAKE 1 TABLET BY MOUTH AT BEDTIME  . propranolol ER (INDERAL LA) 120 MG 24 hr capsule TAKE 1 CAPSULE (120 MG TOTAL) BY MOUTH DAILY.  . trihexyphenidyl (  ARTANE) 2 MG tablet Take 1 tablet (2 mg total) by mouth at bedtime.  . valACYclovir (VALTREX) 1000 MG tablet Take 2 tabs every 12 hours x 1 day as needed for cold sores (Patient taking differently: Take 1,000 mg by mouth 2 (two) times daily. Take 2 tabs every 12 hours x 1 day as needed for cold sores)  . cyanocobalamin 500 MCG tablet Take 1,000 mcg by mouth daily.  Marland Kitchen erythromycin [facility]) ophthalmic ointment Place 1 application into the left eye 4 (four) times daily. (Patient not taking: Reported on 02/23/2017)   No facility-administered encounter medications on file as of 02/23/2017.     Activities of Daily Living In your present state of health, do you have any difficulty performing the following activities: 02/23/2017 08/19/2016  Hearing? N N  Comment pt reports "humming" in both ears -  Vision? N N  Comment pt reports "painful eyes and eyelids"; frequent reoccurrence of styes -  Difficulty concentrating or making decisions? Y N  Walking or climbing stairs?  Y N  Comment SOB when climbing stairs -  Dressing or bathing? N N  Doing errands, shopping? N N  Preparing Food and eating ? N -  Using the Toilet? N -  In the past six months, have you accidently leaked urine? N -  Do you have problems with loss of bowel control? N -  Managing your Medications? N -  Managing your Finances? N -  Housekeeping or managing your Housekeeping? N -  Some recent data might be hidden    Patient Care Team: Jinny Sanders, MD as PCP - General Katherine Mantle, OD as Consulting Physician (Optometry) Tat, Eustace Quail, DO as Consulting Physician (Neurology) Trula Slade, DPM as Consulting Physician (Podiatry)    Assessment:     Hearing Screening   125Hz  250Hz  500Hz  1000Hz  2000Hz  3000Hz  4000Hz  6000Hz  8000Hz   Right ear:   40 40 40  40    Left ear:   40 40 40  40    Vision Screening Comments: Last vision exam in Winter 2017 with Dr. Zadie Rhine   Exercise Activities and Dietary recommendations Current Exercise Habits: The patient does not participate in regular exercise at present, Exercise limited by: None identified  Goals    . Increase water intake          Starting 02/23/2017, I will attempt to drink at least 6-8 glasses of water daily.       Fall Risk Fall Risk  02/23/2017 11/18/2016 11/12/2016 07/08/2016 01/23/2016  Falls in the past year? No No No No No   Depression Screen PHQ 2/9 Scores 02/23/2017 11/12/2016 01/23/2016 10/11/2014  PHQ - 2 Score 2 0 0 0  PHQ- 9 Score 4 - - -     Cognitive Function MMSE - Mini Mental State Exam 02/23/2017 01/23/2016  Orientation to time 5 5  Orientation to Place 5 5  Registration 3 3  Attention/ Calculation 0 0  Recall 3 3  Language- name 2 objects 0 0  Language- repeat 1 1  Language- follow 3 step command 3 3  Language- read & follow direction 0 0  Write a sentence 0 0  Copy design 0 0  Total score 20 20       PLEASE NOTE: A Mini-Cog screen was completed. Maximum score is 20. A value of 0 denotes this part  of [place] MMSE was not completed or the patient failed this part of the Mini-Cog screening.   Mini-Cog Screening Orientation to  Time - Max 5 pts Orientation to Place - Max 5 pts Registration - Max 3 pts Recall - Max 3 pts Language Repeat - Max 1 pts Language Follow 3 Step Command - Max 3 pts   There is no immunization history for the selected administration types on file for this patient. Screening Tests Health Maintenance  Topic Date Due  . INFLUENZA VACCINE  08/14/2017 (Originally 12/15/2016)  . PNA vac Low Risk Adult (1 of 2 - PCV13) 02/01/2018 (Originally 12/18/2012)  . MAMMOGRAM  10/20/2018 (Originally 10/20/2016)  . Fecal DNA (Cologuard)  02/18/2019  . TETANUS/TDAP  11/26/2020  . DEXA SCAN  Completed  . Hepatitis C Screening  Completed      Plan:   I have personally reviewed and addressed the Medicare Annual Wellness questionnaire and have noted the following in the patient's chart:  A. Medical and social history B. Use of alcohol, tobacco or illicit drugs  C. Current medications and supplements D. Functional ability and status E.  Nutritional status F.  Physical activity G. Advance directives H. List of other physicians I.  Hospitalizations, surgeries, and ER visits in previous 12 months J.  [place] to include hearing, vision, cognitive, depression L. Referrals and appointments - none  In addition, I have reviewed and discussed with patient certain preventive protocols, quality metrics, and best practice recommendations. A written personalized care plan for preventive services as well as general preventive health recommendations were provided to patient.  See attached scanned questionnaire for additional information.   Signed,   Lindell Noe, MHA, BS, LPN Health Coach

## 2017-02-23 NOTE — Progress Notes (Signed)
PCP notes:   Health maintenance:  Flu vaccine- pt declined  Abnormal screenings:   Depression score: 4  Patient concerns:   Neck pain - pain is becoming increasingly worse.   Nurse concerns:  None  Next PCP appt:   10/18 @ 1500

## 2017-03-01 ENCOUNTER — Encounter: Payer: Self-pay | Admitting: Gastroenterology

## 2017-03-01 ENCOUNTER — Ambulatory Visit (INDEPENDENT_AMBULATORY_CARE_PROVIDER_SITE_OTHER): Payer: PPO | Admitting: Gastroenterology

## 2017-03-01 VITALS — BP 135/71 | HR 63 | Temp 98.2°F | Ht 65.0 in | Wt 153.6 lb

## 2017-03-01 DIAGNOSIS — R1310 Dysphagia, unspecified: Secondary | ICD-10-CM

## 2017-03-01 DIAGNOSIS — K760 Fatty (change of) liver, not elsewhere classified: Secondary | ICD-10-CM | POA: Diagnosis not present

## 2017-03-01 MED ORDER — OMEPRAZOLE 40 MG PO CPDR: 40.0000 mg | DELAYED_RELEASE_CAPSULE | Freq: Every day | ORAL | 3 refills | Status: DC

## 2017-03-01 NOTE — Progress Notes (Signed)
Jonathon Bellows MD, MRCP(U.K) [address]  [place]  [place], [place] [address]  Main: [phone]  Fax: [phone]   Primary Care Physician: Jinny Sanders, MD  Primary Gastroenterologist:  Dr. Jonathon Bellows   Chief Complaint  Patient presents with  . Choking    HPI: Michelle Donovan is a 71 y.o. 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   Summary of history : She was initially seen on 09/14/16 for abnormal LFT's .RUQ USG 07/2016 showed fatty infiltration of the liver .She underwent a laparoscopic cholecystectomy on 08/23/16  H/o of consuming a lot of fast food. She has gained weight over the years, after her age of 64 gained more weight . She has a family history of central obesity . Grandmother was diabetic. She does have high cholesterol.Never had a blood transfusion, no tatoos, no [employer], no illegal drug use. She does not smoke anymore, quit in 1982. No alcohol. No family history of liver disease.Lab evaluation 09/14/16  USG elastrogram showed F0/F1 fibrosis indicating minimal to none. Albumin 3.7, ALT 101, AST 121 . Not immune to Hep A. LKM ab -negative . IGG not elevated. Iron % saturation is 15 ,ANA ,AMA,Hep C ab,GGT,HbsAg  is negative. F actin weakly positive. Ceruloplasmin,A1AT normal . Not immune to hepatitis B  Interval history   10/26/2016 -03/01/17  LFT's have resolved. Lost 3 lbs.   Hepatic Function Latest Ref Rng & Units 02/21/2017 09/14/2016 07/16/2016  Total Protein 6.0 - 8.3 g/dL 6.9 7.3 6.7  Albumin 3.5 - 5.2 g/dL 3.8 3.7 3.8  AST 0 - 37 U/L 22 121(H) 108(H)  ALT 0 - 35 U/L 30 101(H) 95(H)  Alk Phosphatase 39 - 117 U/L 107 120 111  Total Bilirubin 0.2 - 1.2 mg/dL 0.4 0.3 0.4  Bilirubin, Direct 0.1 - 0.5 mg/dL - <0.1(L) 0.1   She was referred back for difficulty swallowing onging for a few years, chokes with bread easily, getting more often , sometimes has to throw up to get rid of it. Feels  something restricting the passage of food , food does not go the wrong way , points to her neck to the location of her difficulty swallowing. Does not have any heartburn. Does not take any PPI.   Did see the dietician   Current Outpatient Prescriptions  Medication Sig Dispense Refill  . cetirizine (ZYRTEC) 10 MG tablet Take 10 mg by mouth daily.    . clonazePAM (KLONOPIN) 0.5 MG tablet TAKE 1 TAB BY MOUTH IN THE AM AND 1/2 TAB BY MOUTH AT BEDTIME 45 tablet 2  . cyanocobalamin 500 MCG tablet Take 1,000 mcg by mouth daily.    Marland Kitchen erythromycin [facility]) ophthalmic ointment Place 1 application into the left eye 4 (four) times daily. 3.5 g 0  . montelukast (SINGULAIR) 10 MG tablet TAKE 1 TABLET BY MOUTH AT BEDTIME 90 tablet 0  . propranolol ER (INDERAL LA) 120 MG 24 hr capsule TAKE 1 CAPSULE (120 MG TOTAL) BY MOUTH DAILY. 90 capsule 0  . trihexyphenidyl (ARTANE) 2 MG tablet Take 1 tablet (2 mg total) by mouth at bedtime. 30 tablet 3  . valACYclovir (VALTREX) 1000 MG tablet Take 2 tabs every 12 hours x 1 day as needed for cold sores (Patient taking differently: Take 1,000 mg by mouth 2 (two) times daily. Take 2 tabs every 12 hours x 1 day as needed for cold sores) 20 tablet 0  .  ipratropium (ATROVENT) 0.03 % nasal spray     . topiramate (TOPAMAX) 100 MG tablet Take by mouth.     No current facility-administered medications for this visit.     Allergies as of 03/01/2017 - Review Complete 03/01/2017  Allergen Reaction Noted  . Tylenol [acetaminophen] Other (See Comments) 08/16/2016  . Codeine Nausea And Vomiting 02/06/2007  . Topamax [topiramate] Other (See Comments) 04/08/2015    ROS:  General: Negative for anorexia, weight loss, fever, chills, fatigue, weakness. ENT: Negative for hoarseness, difficulty swallowing , nasal congestion. CV: Negative for chest pain, angina, palpitations, dyspnea on exertion, peripheral edema.  Respiratory: Negative for dyspnea at rest, dyspnea on exertion,  cough, sputum, wheezing.  GI: See history of present illness. GU:  Negative for dysuria, hematuria, urinary incontinence, urinary frequency, nocturnal urination.  Endo: Negative for unusual weight change.    Physical Examination:   BP 135/71   Pulse 63   Temp 98.2 F (36.8 C) (Oral)   Ht 5' 5"  (1.651 m)   Wt 153 lb 9.6 oz (69.7 kg)   BMI 25.56 kg/m   General: Well-nourished, well-developed in no acute distress.  Eyes: No icterus. Conjunctivae pink. Mouth: Oropharyngeal mucosa moist and pink , no lesions erythema or exudate. Lungs: Clear to auscultation bilaterally. Non-labored. Heart: Regular rate and rhythm, no murmurs rubs or gallops.  Abdomen: Bowel sounds are normal, nontender, nondistended, no hepatosplenomegaly or masses, no abdominal bruits or hernia , no rebound or guarding.   Extremities: No lower extremity edema. No clubbing or deformities. Neuro: Alert and oriented x 3.  Grossly intact. Skin: Warm and dry, no jaundice.   Psych: Alert and cooperative, normal mood and affect.   Imaging Studies: No results found.  Assessment and Plan:   Michelle Donovan is a 71 y.o. y/o female  here to follow up for abnormal LFT's.smooth muscle antibody which is mildly positive at 20 . Explained it is very likely all of her abnormal LFt's are due to NAFLD but at times can co exist with autoimmune hepatitis and only way to know if she has it is to undergo a liver biopsy which she is presently not keen . I wil repeat LFT's in a few weeks and if continues to rise then she will re consider.    Plan  1. Cut down on fast food 2. Stop all sodas 3. Repeat hepatitis B serology which wasn't done previously. 4. EGD to evaluate for dysphagia. 5. Prilosec 40 mg once daily.    I have discussed alternative options, risks & benefits,  which include, but are not limited to, bleeding, infection, perforation,respiratory complication & drug reaction.  The patient agrees with this plan & written  consent will be obtained.      Dr Jonathon Bellows  MD,MRCP [facility]) Follow up in 6 weeks

## 2017-03-01 NOTE — Addendum Note (Signed)
Addended by: Peggye Ley on: 03/01/2017 03:29 PM   Modules accepted: Orders, SmartSet

## 2017-03-03 ENCOUNTER — Encounter: Payer: Self-pay | Admitting: Family Medicine

## 2017-03-03 ENCOUNTER — Ambulatory Visit (INDEPENDENT_AMBULATORY_CARE_PROVIDER_SITE_OTHER): Payer: PPO | Admitting: Family Medicine

## 2017-03-03 VITALS — BP 110/60 | HR 53 | Temp 97.6°F | Ht 64.75 in | Wt 154.8 lb

## 2017-03-03 DIAGNOSIS — R945 Abnormal results of liver function studies: Secondary | ICD-10-CM

## 2017-03-03 DIAGNOSIS — G250 Essential tremor: Secondary | ICD-10-CM | POA: Diagnosis not present

## 2017-03-03 DIAGNOSIS — I10 Essential (primary) hypertension: Secondary | ICD-10-CM

## 2017-03-03 DIAGNOSIS — E782 Mixed hyperlipidemia: Secondary | ICD-10-CM | POA: Diagnosis not present

## 2017-03-03 DIAGNOSIS — E559 Vitamin D deficiency, unspecified: Secondary | ICD-10-CM | POA: Diagnosis not present

## 2017-03-03 DIAGNOSIS — Z0000 Encounter for general adult medical examination without abnormal findings: Secondary | ICD-10-CM

## 2017-03-03 DIAGNOSIS — R7989 Other specified abnormal findings of blood chemistry: Secondary | ICD-10-CM

## 2017-03-03 MED ORDER — PROPRANOLOL HCL ER 120 MG PO CP24: 120.0000 mg | ORAL_CAPSULE | Freq: Every day | ORAL | 3 refills | Status: DC

## 2017-03-03 MED ORDER — MONTELUKAST SODIUM 10 MG PO TABS: 10.0000 mg | ORAL_TABLET | Freq: Every day | ORAL | 3 refills | Status: DC

## 2017-03-03 NOTE — Patient Instructions (Addendum)
Increase vit D to two a day.  Keep up work on low cholesterol, low carb diet.  Increase exercise as able.  Call to set up mammogram on your own.

## 2017-03-03 NOTE — Progress Notes (Signed)
Subjective:    Patient ID: Michelle Donovan, female    DOB: 1945/06/30, 71 y.o.   MRN: [id number]  HPI The patient presents for  complete physical and review of chronic health problems.   The patient saw Candis Musa, LPN for medicare wellness. Note reviewed in detail and important notes copied below. Health maintenance:  Flu vaccine- pt declined  Abnormal screenings:   Depression score: 4  Patient concerns:   Neck pain - pain is becoming increasingly worse.   Nurse concerns:  None  03/03/17 TODAY   Benign essential tremor.. Much improved on clonazepam, propranolol , and artane  Followed by Neuro.  Hypertension:   Good control but sometimes low on propranolol  BP Readings from Last 3 Encounters:  03/03/17 110/60  03/01/17 135/71  02/23/17 (!) 100/58  Using medication without problems or lightheadedness:  occ Chest pain with exertion:none Edema: none Short of breath: none Average home BPs: not checking Other issues:  Elevated Cholesterol:  Very high. SE to statins and not sure what happened with zetia Lab Results  Component Value Date   CHOL 272 (H) 02/21/2017   HDL 31.90 (L) 02/21/2017   LDLCALC 197 (H) 10/09/2014   LDLDIRECT 208.0 02/21/2017   TRIG 222.0 (H) 02/21/2017   CHOLHDL 9 02/21/2017  Using medications without problems: Muscle aches:  Diet compliance: working on eating better overall Exercise: minimal. Other complaints:   Prediabetes  Stable   Vit D: low  History of fatty liver: LFTS. Dramatically improved with diet changes.  Social History /Family History/Past Medical History reviewed in detail and updated in EMR if needed. Blood pressure 110/60, pulse (!) 53, temperature 97.6 F (36.4 C), temperature source Oral, height 5' 4.75" (1.645 m), weight 154 lb 12 oz (70.2 kg).  Body mass index is 25.95 kg/m.  BP Readings from Last 3 Encounters:  03/03/17 110/60  03/01/17 135/71  02/23/17 (!) 100/58        Review of  Systems  Constitutional: Negative for fatigue and fever.  HENT: Negative for congestion.   Eyes: Negative for pain.  Respiratory: Negative for cough and shortness of breath.   Cardiovascular: Negative for chest pain, palpitations and leg swelling.  Gastrointestinal: Negative for abdominal pain.  Genitourinary: Negative for dysuria and vaginal bleeding.  Musculoskeletal: Negative for back pain.  Neurological: Negative for syncope, light-headedness and headaches.  Psychiatric/Behavioral: Negative for dysphoric mood.       Objective:   Physical Exam  Constitutional: She is oriented to person, place, and time. Vital signs are normal. She appears well-developed and well-nourished. She is cooperative.  Non-toxic appearance. She does not appear ill. No distress.  HENT:  Head: Normocephalic.  Right Ear: Hearing, tympanic membrane, external ear and ear canal normal. Tympanic membrane is not erythematous, not retracted and not bulging.  Left Ear: Hearing, tympanic membrane, external ear and ear canal normal. Tympanic membrane is not erythematous, not retracted and not bulging.  Nose: No mucosal edema or rhinorrhea. Right sinus exhibits no maxillary sinus tenderness and no frontal sinus tenderness. Left sinus exhibits no maxillary sinus tenderness and no frontal sinus tenderness.  Mouth/Throat: Uvula is midline, oropharynx is clear and moist and mucous membranes are normal.  Eyes: Pupils are equal, round, and reactive to light. Conjunctivae, EOM and lids are normal. Lids are everted and swept, no foreign bodies found.  Neck: Trachea normal and normal range of motion. Neck supple. Carotid bruit is not present. No thyroid mass and no thyromegaly present.  Cardiovascular: Normal rate,  regular rhythm, S1 normal, S2 normal, normal heart sounds, intact distal pulses and normal pulses.  Exam reveals no gallop and no friction rub.   No murmur heard. Pulmonary/Chest: Effort normal and breath sounds normal. No  tachypnea. No respiratory distress. She has no decreased breath sounds. She has no wheezes. She has no rhonchi. She has no rales.  Abdominal: Soft. Normal appearance and bowel sounds are normal. There is no tenderness.  Neurological: She is alert and oriented to person, place, and time. She has normal strength and normal reflexes. She displays tremor. No cranial nerve deficit or sensory deficit. She exhibits normal muscle tone. She displays a negative Romberg sign. Coordination and gait normal. GCS eye subscore is 4. GCS verbal subscore is 5. GCS motor subscore is 6.  Nml cerebellar exam   No papilledema  Skin: Skin is warm, dry and intact. No rash noted.  Psychiatric: She has a normal mood and affect. Her speech is normal and behavior is normal. Judgment and thought content normal. Her mood appears not anxious. Cognition and memory are normal. Cognition and memory are not impaired. She does not exhibit a depressed mood. She exhibits normal recent memory and normal remote memory.          Assessment & Plan:  The patient's preventative maintenance and recommended screening tests for an annual wellness exam were reviewed in full today. Brought up to date unless services declined.  Counselled on the importance of diet, exercise, and its role in overall health and mortality. The patient's FH and SH was reviewed, including their home life, tobacco status, and drug and alcohol status.   Refused flu. No pap/DVE given total Hysterectomy.  Former smoker, remotely. <25 pack year history Colon: last 2006, tics, repeat in 10 years,  cologuard 2017.. repeat in 2020 Bone density: nml 2016.Marland Kitchen Repeat 2021 Mammogram: nml 2016.Marland Kitchen Check every other year.

## 2017-03-08 ENCOUNTER — Encounter: Payer: Self-pay | Admitting: *Deleted

## 2017-03-09 ENCOUNTER — Ambulatory Visit: Payer: PPO | Admitting: Anesthesiology

## 2017-03-09 ENCOUNTER — Encounter
Admission: RE | Disposition: A | Discharge status: Home / Self Care | Payer: Self-pay | Source: Ambulatory Visit | Attending: Gastroenterology

## 2017-03-09 ENCOUNTER — Ambulatory Visit
Admission: RE | Admit: 2017-03-09 | Discharge: 2017-03-09 | Disposition: A | Discharge status: Home / Self Care | Payer: PPO | Source: Ambulatory Visit | Attending: Gastroenterology | Admitting: Gastroenterology

## 2017-03-09 DIAGNOSIS — I10 Essential (primary) hypertension: Secondary | ICD-10-CM | POA: Diagnosis not present

## 2017-03-09 DIAGNOSIS — K222 Esophageal obstruction: Secondary | ICD-10-CM

## 2017-03-09 DIAGNOSIS — R1310 Dysphagia, unspecified: Secondary | ICD-10-CM

## 2017-03-09 DIAGNOSIS — E782 Mixed hyperlipidemia: Secondary | ICD-10-CM | POA: Diagnosis not present

## 2017-03-09 DIAGNOSIS — Z888 Allergy status to other drugs, medicaments and biological substances status: Secondary | ICD-10-CM | POA: Insufficient documentation

## 2017-03-09 DIAGNOSIS — M1990 Unspecified osteoarthritis, unspecified site: Secondary | ICD-10-CM | POA: Diagnosis not present

## 2017-03-09 DIAGNOSIS — K589 Irritable bowel syndrome without diarrhea: Secondary | ICD-10-CM | POA: Diagnosis not present

## 2017-03-09 DIAGNOSIS — J45909 Unspecified asthma, uncomplicated: Secondary | ICD-10-CM | POA: Diagnosis not present

## 2017-03-09 DIAGNOSIS — I739 Peripheral vascular disease, unspecified: Secondary | ICD-10-CM | POA: Diagnosis not present

## 2017-03-09 DIAGNOSIS — Z79899 Other long term (current) drug therapy: Secondary | ICD-10-CM | POA: Diagnosis not present

## 2017-03-09 DIAGNOSIS — Z87891 Personal history of nicotine dependence: Secondary | ICD-10-CM | POA: Insufficient documentation

## 2017-03-09 DIAGNOSIS — K210 Gastro-esophageal reflux disease with esophagitis: Secondary | ICD-10-CM | POA: Diagnosis not present

## 2017-03-09 HISTORY — PX: ESOPHAGOGASTRODUODENOSCOPY (EGD) WITH PROPOFOL: SHX5813

## 2017-03-09 SURGERY — ESOPHAGOGASTRODUODENOSCOPY (EGD) WITH PROPOFOL
Anesthesia: General

## 2017-03-09 MED ORDER — MIDAZOLAM HCL 2 MG/2ML IJ SOLN
INTRAMUSCULAR | Status: DC | PRN
  Administered 2017-03-09: 1 mg via INTRAVENOUS

## 2017-03-09 MED ORDER — SODIUM CHLORIDE 0.9 % IV SOLN
INTRAVENOUS | Status: DC
  Administered 2017-03-09: 14:00:00 via INTRAVENOUS

## 2017-03-09 MED ORDER — PROPOFOL 10 MG/ML IV BOLUS
INTRAVENOUS | Status: AC
  Filled 2017-03-09: qty 20

## 2017-03-09 MED ORDER — PROPOFOL 10 MG/ML IV BOLUS
INTRAVENOUS | Status: DC | PRN
  Administered 2017-03-09: 50 mg via INTRAVENOUS

## 2017-03-09 MED ORDER — MIDAZOLAM HCL 2 MG/2ML IJ SOLN
INTRAMUSCULAR | Status: AC
  Filled 2017-03-09: qty 2

## 2017-03-09 MED ORDER — PROPOFOL 500 MG/50ML IV EMUL
INTRAVENOUS | Status: DC | PRN
  Administered 2017-03-09: 160 ug/kg/min via INTRAVENOUS

## 2017-03-09 MED ORDER — LIDOCAINE HCL (CARDIAC) 20 MG/ML IV SOLN
INTRAVENOUS | Status: DC | PRN
  Administered 2017-03-09: 60 mg via INTRAVENOUS

## 2017-03-09 MED ORDER — LIDOCAINE HCL (PF) 2 % IJ SOLN
INTRAMUSCULAR | Status: AC
  Filled 2017-03-09: qty 10

## 2017-03-09 NOTE — Transfer of Care (Signed)
Immediate Anesthesia Transfer of Care Note  Patient: Michelle Donovan  Procedure(s) Performed: ESOPHAGOGASTRODUODENOSCOPY (EGD) WITH PROPOFOL (N/A )  Patient Location: PACU  Anesthesia Type:General  Level of Consciousness: sedated  Airway & Oxygen Therapy: Patient Spontanous Breathing and Patient connected to nasal cannula oxygen  Post-op Assessment: Report given to RN and Post -op Vital signs reviewed and stable  Post vital signs: Reviewed and stable  Last Vitals:  Vitals:   03/09/17 1316 03/09/17 1455  BP: (!) 138/52 (!) 106/44  Pulse: (!) 55   Resp: 18 16  Temp: (!) 35.7 C (!) 36.2 C  SpO2: 100%     Last Pain:  Vitals:   03/09/17 1455  TempSrc: Tympanic  PainSc:          Complications: No apparent anesthesia complications

## 2017-03-09 NOTE — Anesthesia Preprocedure Evaluation (Signed)
Anesthesia Evaluation  Patient identified by MRN, date of birth, ID band Patient awake    Reviewed: Allergy & Precautions, NPO status , Patient's Chart, lab work & pertinent test results  History of Anesthesia Complications (+) PONV and history of anesthetic complications  Airway Mallampati: II  TM Distance: >3 FB     Dental  (+) Partial Upper, Dental Advidsory Given   Pulmonary neg shortness of breath, asthma , neg sleep apnea, neg COPD, neg recent URI, former smoker,           Cardiovascular Exercise Tolerance: Good hypertension, (-) angina+ Peripheral Vascular Disease  (-) CAD, (-) Past MI, (-) Cardiac Stents and (-) CABG (-) dysrhythmias (-) Valvular Problems/Murmurs     Neuro/Psych PSYCHIATRIC DISORDERS negative neurological ROS     GI/Hepatic negative GI ROS, NAFLD History noted. CG   Endo/Other  negative endocrine ROS  Renal/GU negative Renal ROS  negative genitourinary   Musculoskeletal  (+) Arthritis ,   Abdominal   Peds  Hematology negative hematology ROS (+)   Anesthesia Other Findings Past Medical History: No date: Allergic rhinitis, cause unspecified No date: Asthma     Comment:  not bad per pt No date: Dermatophytosis of scalp and beard No date: Diabetes mellitus without complication (HCC)     Comment:  history of no longer a problem No date: Diverticulosis No date: Essential and other specified forms of tremor No date: HTN (hypertension) No date: Irritable bowel syndrome No date: Lumbago     Comment:  bulging disk per MRI  No date: Mixed hyperlipidemia No date: Other acute reactions to stress No date: Other diseases of lung, not elsewhere classified     Comment:  solitary pulm. nodule(left) No date: PONV (postoperative nausea and vomiting) No date: Thyroid nodule No date: Unspecified asthma(493.90) No date: Unspecified vitamin D deficiency   Reproductive/Obstetrics negative OB  ROS                             Anesthesia Physical  Anesthesia Plan  ASA: III  Anesthesia Plan: General   Post-op Pain Management:    Induction: Intravenous  PONV Risk Score and Plan: 4 or greater and Propofol infusion  Airway Management Planned: Nasal Cannula  Additional Equipment:   Intra-op Plan:   Post-operative Plan: Extubation in OR  Informed Consent: I have reviewed the patients History and Physical, chart, labs and discussed the procedure including the risks, benefits and alternatives for the proposed anesthesia with the patient or authorized representative who has indicated his/her understanding and acceptance.   Dental advisory given  Plan Discussed with: Anesthesiologist and CRNA  Anesthesia Plan Comments:         Anesthesia Quick Evaluation

## 2017-03-09 NOTE — H&P (Signed)
Jonathon Bellows MD [address] [place], [place] [address] Phone: [phone] Fax : [phone]  Primary Care Physician:  Jinny Sanders, MD Primary Gastroenterologist:  Dr. Jonathon Bellows   Pre-Procedure History & Physical: HPI:  Michelle Donovan is a 71 y.o. female is here for an endoscopy.   Past Medical History:  Diagnosis Date  . Allergic rhinitis, cause unspecified   . Asthma    not bad per pt  . Dermatophytosis of scalp and beard   . Diabetes mellitus without complication ([facility])    history of no longer a problem  . Diverticulosis   . Essential and other specified forms of tremor   . HTN (hypertension)   . Irritable bowel syndrome   . Lumbago    bulging disk per MRI   . Mixed hyperlipidemia   . Other acute reactions to stress   . Other diseases of lung, not elsewhere classified    solitary pulm. nodule(left)  . PONV (postoperative nausea and vomiting)   . Thyroid nodule   . Unspecified asthma(493.90)   . Unspecified vitamin D deficiency     Past Surgical History:  Procedure Laterality Date  . ABDOMINAL HYSTERECTOMY    . CHOLECYSTECTOMY N/A 08/23/2016   Procedure: LAPAROSCOPIC CHOLECYSTECTOMY;  Surgeon: Clovis Riley, MD;  Location: [facility];  Service: General;  Laterality: N/A;  . COLONOSCOPY    . LAPAROSCOPIC TOTAL HYSTERECTOMY  2002  . LAPAROSCOPIC UNILATERAL SALPINGO OOPHERECTOMY    . TONSILLECTOMY  1967/68  . TUBAL LIGATION  1986    Prior to Admission medications   Medication Sig Start Date End Date Taking? Authorizing Provider  cetirizine (ZYRTEC) 10 MG tablet Take 10 mg by mouth daily.   Yes [provider]  clonazePAM (KLONOPIN) 0.5 MG tablet TAKE 1 TAB BY MOUTH IN THE AM AND 1/2 TAB BY MOUTH AT BEDTIME 12/15/16  Yes Tat, Rebecca S, DO  montelukast (SINGULAIR) 10 MG tablet Take 1 tablet (10 mg total) by mouth at bedtime. 03/03/17  Yes Bedsole, Amy E, MD  omeprazole (PRILOSEC) 40 MG capsule Take 1 capsule (40 mg total) by mouth daily.  03/01/17  Yes Jonathon Bellows, MD  propranolol ER (INDERAL LA) 120 MG 24 hr capsule Take 1 capsule (120 mg total) by mouth daily. 03/03/17  Yes Bedsole, Amy E, MD  trihexyphenidyl (ARTANE) 2 MG tablet Take 1 tablet (2 mg total) by mouth at bedtime. 12/15/16  Yes Tat, Eustace Quail, DO  cyanocobalamin 500 MCG tablet Take 1,000 mcg by mouth daily.    [provider]  erythromycin [facility]) ophthalmic ointment Place 1 application into the left eye 4 (four) times daily. Patient not taking: Reported on 03/09/2017 08/02/16   Lucille Passy, MD  ipratropium (ATROVENT) 0.03 % nasal spray  09/05/14   [provider]  topiramate (TOPAMAX) 100 MG tablet Take by mouth. 07/01/14   [provider]  valACYclovir (VALTREX) 1000 MG tablet Take 2 tabs every 12 hours x 1 day as needed for cold sores Patient taking differently: Take 1,000 mg by mouth 2 (two) times daily. Take 2 tabs every 12 hours x 1 day as needed for cold sores 05/25/16   Jearld Fenton, NP    Allergies as of 03/02/2017 - Review Complete 03/01/2017  Allergen Reaction Noted  . Tylenol [acetaminophen] Other (See Comments) 08/16/2016  . Codeine Nausea And Vomiting 02/06/2007  . Topamax [topiramate] Other (See Comments) 04/08/2015    Family History  Problem Relation Age of Onset  .  Stroke Mother   . Tremor Mother   . Diabetes Mother   . Heart failure Mother   . Other Father        Trigeminal neuralgia  . Diabetes Sister   . Coronary artery disease Sister   . Irritable bowel syndrome Sister   . Tremor Sister   . Diabetes Brother        x 3  . Coronary artery disease Brother        x 3  . Irritable bowel syndrome Brother        x 3  . Tremor Brother        x 3  . Breast cancer Unknown        Aunts and cousin  . Colon cancer Maternal Grandfather     Social History   Social History  . Marital status: Divorced    Spouse name: N/A  . Number of children: 1  . Years of education: N/A   Occupational History  .  retired Other    [place]  .  Retired  . part time [place] History Main Topics  . Smoking status: Former Smoker    Quit date: 05/17/1980  . Smokeless tobacco: Never Used  . Alcohol use No  . Drug use: No  . Sexual activity: No   Other Topics Concern  . 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 Neck:  Supple; no masses or thyromegaly. Lungs:  Clear throughout to auscultation.    Heart:  Regular rate and rhythm. Abdomen:  Soft, nontender and nondistended. Normal bowel sounds, without guarding, and without rebound.   Neurologic:  Alert and  oriented x4;  grossly normal neurologically.  Impression/Plan: Michelle Donovan is here for an endoscopy to be performed for dysphagia   Risks, benefits, limitations, and alternatives regarding  endoscopy and possible dilation  have been reviewed with the patient.  Questions have been answered.  All parties agreeable.   Jonathon Bellows, MD  03/09/2017, 1:34 PM

## 2017-03-09 NOTE — Op Note (Signed)
[facility] Gastroenterology Patient Name: Michelle Donovan Procedure Date: 03/09/2017 2:26 PM MRN: [id number] Account #: [id number] Date of Birth: 1945/09/26 Admit Type: Outpatient Age: 71 Room: [facility] ENDO ROOM 1 Gender: Female Note Status: Finalized Procedure:            Upper GI endoscopy Indications:          Dysphagia Providers:            Jonathon Bellows MD, MD Referring MD:         Jinny Sanders MD, MD (Referring MD) Medicines:            Monitored Anesthesia Care Complications:        No immediate complications. Procedure:            Pre-Anesthesia Assessment:                       - ASA Grade Assessment: III - A patient with severe                        systemic disease.                       After obtaining informed consent, the endoscope was                        passed under direct vision. Throughout the procedure,                        the patient's blood pressure, pulse, and oxygen                        saturations were monitored continuously. The Endoscope                        was introduced through the mouth, and advanced to the                        third part of duodenum. The upper GI endoscopy was                        accomplished with ease. The patient tolerated the                        procedure well. Findings:      A mild Schatzki ring (acquired) was found in the lower third of the       esophagus and at the gastroesophageal junction. A TTS dilator was passed       through the scope. Dilation with a 15-16.5-18 mm balloon dilator was       performed to 18 mm. The dilation site was examined following endoscope       reinsertion and showed no change.      Normal mucosa was found in the entire esophagus. Biopsies were taken       with a cold forceps for histology.      The stomach was normal.      The examined duodenum was normal. Impression:           - Mild Schatzki ring. Dilated.                       - Normal mucosa  was found in the  entire esophagus.                        Biopsied.                       - Normal stomach.                       - Normal examined duodenum. Recommendation:       - Discharge patient to home (with escort).                       - Advance diet as tolerated.                       - Continue present medications.                       - Await pathology results.                       - Return to my office in 3 months. Procedure Code(s):    --- Professional ---                       [phone], Esophagogastroduodenoscopy, flexible, transoral;                        with transendoscopic balloon dilation of esophagus                        (less than 30 mm diameter)                       43239, Esophagogastroduodenoscopy, flexible, transoral;                        with biopsy, single or multiple Diagnosis Code(s):    --- Professional ---                       K22.2, Esophageal obstruction                       R13.10, Dysphagia, unspecified CPT copyright 2016 American Medical Association. All rights reserved. The codes documented in this report are preliminary and upon coder review may  be revised to meet current compliance requirements. Jonathon Bellows, MD Jonathon Bellows MD, MD 03/09/2017 2:53:28 PM This report has been signed electronically. Number of Addenda: 0 Note Initiated On: 03/09/2017 2:26 PM      [facility]

## 2017-03-09 NOTE — Anesthesia Post-op Follow-up Note (Signed)
Anesthesia QCDR form completed.        

## 2017-03-10 NOTE — Anesthesia Postprocedure Evaluation (Signed)
Anesthesia Post Note  Patient: Michelle Donovan  Procedure(s) Performed: ESOPHAGOGASTRODUODENOSCOPY (EGD) WITH PROPOFOL (N/A )  Patient location during evaluation: PACU Anesthesia Type: General Level of consciousness: awake Pain management: pain level controlled Vital Signs Assessment: post-procedure vital signs reviewed and stable Respiratory status: spontaneous breathing Cardiovascular status: stable Anesthetic complications: no     Last Vitals:  Vitals:   03/09/17 1455 03/09/17 1525  BP: (!) 106/44 136/67  Pulse:    Resp: 16   Temp: (!) 36.2 C   SpO2:      Last Pain:  Vitals:   03/09/17 1455  TempSrc: Tympanic  PainSc:                  VAN STAVEREN,Zissy Hamlett

## 2017-03-11 ENCOUNTER — Encounter: Payer: Self-pay | Admitting: Gastroenterology

## 2017-03-11 LAB — SURGICAL PATHOLOGY

## 2017-03-13 ENCOUNTER — Encounter: Payer: Self-pay | Admitting: Gastroenterology

## 2017-03-15 ENCOUNTER — Telehealth: Payer: Self-pay | Admitting: Gastroenterology

## 2017-03-15 NOTE — Telephone Encounter (Signed)
Left voice message for patient to call and schedule a 3 month follow up with Dr. Vicente Males

## 2017-03-16 ENCOUNTER — Other Ambulatory Visit
Admission: RE | Admit: 2017-03-16 | Discharge: 2017-03-16 | Disposition: A | Discharge status: Home / Self Care | Payer: PPO | Source: Ambulatory Visit | Attending: Gastroenterology | Admitting: Gastroenterology

## 2017-03-16 DIAGNOSIS — R945 Abnormal results of liver function studies: Secondary | ICD-10-CM | POA: Diagnosis not present

## 2017-03-16 LAB — HEPATIC FUNCTION PANEL
ALBUMIN: 3.9 g/dL (ref 3.5–5.0)
ALT: 37 U/L (ref 14–54)
AST: 32 U/L (ref 15–41)
Alkaline Phosphatase: 120 U/L (ref 38–126)
Bilirubin, Direct: <0.1 mg/dL — ABNORMAL LOW (ref 0.1–0.5)
TOTAL PROTEIN: 7.9 g/dL (ref 6.5–8.1)
Total Bilirubin: 0.4 mg/dL (ref 0.3–1.2)

## 2017-03-16 NOTE — Assessment & Plan Note (Addendum)
Very high. SE to statins and not sure what happened with zetia  Pt refuses medication.. Wishes to work more aggressively on diet changes

## 2017-03-16 NOTE — Assessment & Plan Note (Signed)
Improved on current regimen. Followed by neurology.

## 2017-03-16 NOTE — Assessment & Plan Note (Signed)
Improved with dietary change, low fat diet.

## 2017-03-16 NOTE — Assessment & Plan Note (Signed)
Replete

## 2017-03-16 NOTE — Assessment & Plan Note (Signed)
Well controlled. Continue current medication.  

## 2017-03-17 LAB — HEPATITIS B E ANTIBODY: Hep B E Ab: NEGATIVE

## 2017-03-17 LAB — HEPATITIS B E ANTIGEN: HEP B E AG: NEGATIVE

## 2017-03-17 LAB — ANTI-SMOOTH MUSCLE ANTIBODY, IGG: F-ACTIN AB IGG: 13 U (ref 0–19)

## 2017-03-17 LAB — MITOCHONDRIAL ANTIBODIES: Mitochondrial M2 Ab, IgG: 13 Units (ref 0.0–20.0)

## 2017-03-17 LAB — HEPATITIS B CORE ANTIBODY, TOTAL: HEP B C TOTAL AB: NEGATIVE

## 2017-03-28 ENCOUNTER — Other Ambulatory Visit: Payer: Self-pay | Admitting: Neurology

## 2017-04-18 ENCOUNTER — Encounter: Payer: Self-pay | Admitting: Family Medicine

## 2017-05-22 ENCOUNTER — Other Ambulatory Visit: Payer: Self-pay | Admitting: Neurology

## 2017-05-23 NOTE — Progress Notes (Signed)
Subjective:    Michelle Donovan was seen in consultation in the movement disorder clinic at the request of Jinny Sanders, MD.  The evaluation is for tremor.  Pt has previously seen Dr. Leta Baptist and notes from 2012 were reviewed.  Pt reports that this was just a one time consultation in 2012.   The patient is a 72 y.o. right handed female with a history of tremor.   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  She states that it is in both hands but R is worse than the L (but she uses the right more). There is a family hx of tremor in her mother and brother.  She may use xanax very if going out (less than one time per month) to suppress tremor.    Affected by caffeine:  yes(drinks 8 oz coffee per da) Affected by alcohol:  unknown Affected by stress:  yes Affected by fatigue:  no Spills soup if on spoon:  yes (uses weighted utensils at home for this) Spills glass of liquid if full:  yes Affects ADL's (tying shoes, brushing teeth, etc):  no except trouble with eyeliner  11/13/13 update:  The patient returns today for follow up.  She was restarted on topamax last visit.  She was doing markedly better when it was first started but she has recently had some thyroid issues (multinodular goiter), pink eye, bilateral frozen shoulders and URI and tremor got worse during this.  She has been worked up for elevated liver enzymes but then repeat enzymes were normal and hepatitis panel was normal.  She had some paresthesias with the topamax on initiation.  She has lost some weight; no longer able to drink her coke as it tastes poorly with the topamax.    02/14/14 update:  Pt returns today for f/u.  Noted hair loss and called here and I d/c topamax 2 days ago.   Originally thought that hair loss due to highlights had put in hair over the summer but then realized that hair loss continued.   Pt frustrated as knew that topamax worked for tremor.   Has noted chin tremor.  Restarted back to work.    05/20/14 update:  Pt has a hx of ET.  Tried to d/c the topamax because of hair loss and start neurontin but tremor was not well controlled on neurontin alone and she really wanted wanted to go back on the topamax, knowing the risks.  She states that it isn't really falling out like it was but it isn't coming back like she hoped either.  She states that tremor isn't well controlled either.  She states that she wants to stay on the gabapentin because it is helping the frozen shoulder.  She is feeling depressed because of the tremor.  Asks if her thyroid nodules could be causing the tremor.  Her mother had a goiter and also had tremor.   02/18/15 update:  The patient presents today for follow-up.  I have not seen her since January, 2016.  She ended up wanting to increase the dose of Topamax, which she did to a total of 200 mg per day.  She sent me an email recently stating that tremor was less well controlled and she was having more hair loss.  She wanted to consider other options.  She admits that stress has increased greatly; her daughter and grandchild have moved back into her home.  She  reports that her daughter makes things very stressful for her.   She is noting tremor with eating, drinking, putting on makeup, writing.   She was on gabapentin for her shoulder pain but she has d/c it now.  She is on propranolol LA 120 mg daily for blood pressure.  We have talked about Artane in the past, but she has not wanted to try that.  She has tried only 1 pill of primidone in the past and it sounds like she had first dose effect and has not wanted to retry that.    05/02/15 update:  The patient is following up today regarding her essential tremor.  She is on Inderal LA, 120 mg daily.  Last visit, we cautiously start a low-dose clonazepam 0.5 mg, 1/2 tablet in the morning and half tablet at night. She states that the medication may be helping but not enough.  Fortunately,  it has not made her sleepy.   Her Topamax was discontinued because of side effects of hair loss. Still very stressed over living situation with daughter/grandson living in home.  Doesn't know what she will be doing for Christmas if anything and seems sad about that.  Her daughter is gone right now and her side of the family is going to [place] for christmas.  08/01/15 update:  The patient is following up today regarding her essential tremor.  She is on Inderal LA, 120 mg daily.  Last visit, we cautiously increase her clonazepam 0.5 mg, so that she is taking 1 tablet in the morning and half a tablet at night.  Unfortunately, despite my urging, she has refused to try primidone, because she had a first dose effect in the past when she took one tablet.  She states today that tremor has not been well controlled; she has occasional good days but they are few and far between.  11/04/15 update:  The patient is following up today regarding her essential tremor.  She is on Inderal LA which was increased last visit to 160 mg daily.  She went down to 120 mg daily because she was sleepy and she didn't think that it was helping much and asked me in an email about focused u/s.  I told her that insurance doesn't pay for this right now even though it was FDA approved.  She remains on clonazepam 0.5 mg, so that she is taking 1 tablet in the morning and half a tablet at night.  Unfortunately, despite my urging, she has refused to try primidone, because she had a first dose effect in the past when she took one tablet.  She states today that she continues to have tremor.  She is on prednisone currently because of "inflammation" in the top of the L foot.  She doesn't think that prednisone has changed tremor.   07/08/16 update:  Patient follows up today.  I have not seen her in about 8 months.  I did review her records since last visit.  I cautiously tried her on Artane last visit.  She emailed me in July to state that she thought that  the medication made her tremor worse.  It turns out that when she started the Artane, she stopped her clonazepam, which was not the intention, and is likely the reason that tremor increased.  I asked her to start the clonazepam again, 0.5 mg, one tablet in the morning and half a tablet at night.  I offered her an opinion at an academic center but she declined that.  She states that she cut back on the artane to 1/2 tablet (of the 2mg ) at night because of some dizziness and it seems to be helping some.  She is still on the Inderal LA, 120 g daily.  She denies hallucinations.  She denies falls.  She denies lightheadedness or near syncope.  11/18/16 update:  Pt seen in f/u.   Is on artane 1mg  qhs, inderal LA 120 mg and klonopin 0.5 mg, 1 in the AM and 1/2 at night. Tremor has been about the same.  Has better and worse days.   [facility] and the new one isn't as good and more trouble with cutting this new one.   The records that were made available to me were reviewed.  LFTs have been elevated and evaluated.  Fatty liver and gallstones notes on u/s.  Had cholecystectomy on 08/23/16.  Saw GI in May and felt that etiology was Non alcoholic fatty liver from dietary consumption of fast foods daily.  She was referred to dietician and saw her on 11/12/16.  05/24/17 update: Patient is seen today in follow-up for tremor.  She remains on trihexyphenidyl, 1 mg at night.  She is also on Inderal LA, 120 mg daily and clonazepam 0.5 mg, 1 tablet in the morning and half a tablet at night.  Tremor is well controlled "and I can write."  She is adament that she doesn't want to change meds even knowing her pulse.  I have reviewed her records since last visit.  She has been seen by gastroenterology for dysphasia since our last visit.  She had an EGD on March 09, 2017.  Mild Schatzki's ring was noted and was dilated. She isn't sure it helped.  She does state that she doesn't get choked as much but pills will get  stuck.   Liver function testing was much improved 3 months ago compared to 8 months ago.  She attributes this to giving up dark sodas.  She is still eating fast food but not as much as previously  Current/Previously tried tremor medications: topamax;  Metoprolol - felt drained ; primidone (50 mg - 1/2 at night but got first dose effect and stopped it); meclizine (? Why); on propranolol now - ? Helping now; gabapentin; artane  Current medications that may exacerbate tremor:  n/a  Outside reports reviewed: historical medical records and referral letter/letters.  Allergies  Allergen Reactions  . Tylenol [Acetaminophen] Other (See Comments)    Increased liver enzymes  . Codeine Nausea And Vomiting  . Topamax [Topiramate] Other (See Comments)    Hair loss    Current Outpatient Medications on File Prior to Visit  Medication Sig Dispense Refill  . cetirizine (ZYRTEC) 10 MG tablet Take 10 mg by mouth daily.    . clonazePAM (KLONOPIN) 0.5 MG tablet TAKE 1 TAB BY MOUTH EVERY MORNING AND 1/2 TAB NIGHTLY AT BEDTIME 45 tablet 2  . montelukast (SINGULAIR) 10 MG tablet Take 1 tablet (10 mg total) by mouth at bedtime. 90 tablet 3  . propranolol ER (INDERAL LA) 120 MG 24 hr capsule Take 1 capsule (120 mg total) by mouth daily. 90 capsule 3  . trihexyphenidyl (ARTANE) 2 MG tablet Take 1 tablet (2 mg total) by mouth at bedtime. 30 tablet 3  . valACYclovir (VALTREX) 1000 MG tablet Take 2 tabs every 12 hours x 1 day as needed for cold sores (Patient not taking: Reported on 05/24/2017) 20 tablet 0   No current facility-administered medications on file  prior to visit.     Past Medical History:  Diagnosis Date  . Allergic rhinitis, cause unspecified   . Asthma    not bad per pt  . Dermatophytosis of scalp and beard   . Diabetes mellitus without complication ([place])    history of no longer a problem  . Diverticulosis   . Essential and other specified forms of tremor   . HTN (hypertension)   . Irritable  bowel syndrome   . Lumbago    bulging disk per MRI   . Mixed hyperlipidemia   . Other acute reactions to stress   . Other diseases of lung, not elsewhere classified    solitary pulm. nodule(left)  . PONV (postoperative nausea and vomiting)   . Thyroid nodule   . Unspecified asthma(493.90)   . Unspecified vitamin D deficiency     Past Surgical History:  Procedure Laterality Date  . ABDOMINAL HYSTERECTOMY    . CHOLECYSTECTOMY N/A 08/23/2016   Procedure: LAPAROSCOPIC CHOLECYSTECTOMY;  Surgeon: Clovis Riley, MD;  Location: [facility];  Service: General;  Laterality: N/A;  . COLONOSCOPY    . ESOPHAGOGASTRODUODENOSCOPY (EGD) WITH PROPOFOL N/A 03/09/2017   Procedure: ESOPHAGOGASTRODUODENOSCOPY (EGD) WITH PROPOFOL;  Surgeon: Jonathon Bellows, MD;  Location: [facility] ENDOSCOPY;  Service: Gastroenterology;  Laterality: N/A;  . LAPAROSCOPIC TOTAL HYSTERECTOMY  2002  . LAPAROSCOPIC UNILATERAL SALPINGO OOPHERECTOMY    . TONSILLECTOMY  1967/68  . TUBAL LIGATION  1986    Social History   Socioeconomic History  . Marital status: Divorced    Spouse name: Not on file  . Number of children: 1  . Years of education: Not on file  . Highest education level: Not on file  Social Needs  . Financial resource strain: Not on file  . Food insecurity - worry: Not on file  . Food insecurity - inability: Not on file  . Transportation needs - medical: Not on file  . Transportation needs - non-medical: Not on file  Occupational History  . Occupation: retired    Fish farm manager: OTHER    Comment: [place]: [employer]  . Occupation: part time    Employer: [employer]  Tobacco Use  . Smoking status: Former Smoker    Last attempt to quit: 05/17/1980    Years since quitting: 37.0  . Smokeless tobacco: Never Used  Substance and Sexual Activity  . Alcohol use: No  . Drug use: No  . Sexual activity: No  Other Topics Concern  . 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 Mother  Deceased at age 72       CHF, Essential Tremor  . Father  Deceased       old age, heart disease  . Brother  Alive       diabetes  . Brother  Alive       COPD, heart disease  . Brother  Alive       Benign Essential Tremor  . Sister  Alive       healthy  . Daughter  Alive       anxiety  . Sister  (Not Specified)  . Sister  (Not Specified)  . Sister  (  Not Specified)  . Sister  (Not Specified)  . Brother  (Not Specified)  . Brother  (Not Specified)  . Brother  (Not Specified)  . Brother  (Not Specified)  . Unknown  (Not Specified)  . MGF  (Not Specified)    Review of Systems A complete 10 system ROS was obtained and was negative apart from what is mentioned.   Objective:   VITALS:   Vitals:   05/24/17 1512  BP: 110/62  Pulse: (!) 50  SpO2: 98%  Weight: 154 lb (69.9 kg)  Height: 5' 5.5" (1.664 m)   Wt Readings from Last 3 Encounters:  05/24/17 154 lb (69.9 kg)  03/09/17 153 lb (69.4 kg)  03/03/17 154 lb 12 oz (70.2 kg)    Gen:  Appears stated age and in NAD.  Flat affect today  HEENT:  Normocephalic, atraumatic. The mucous membranes are moist. The superficial temporal arteries are without ropiness or tenderness. Cardiovascular: Regular rate and rhythm. Lungs: Clear to auscultation bilaterally. Neck: There are no carotid bruits noted bilaterally.  NEUROLOGICAL:  Orientation:  The patient is alert and oriented x 3.   Cranial nerves: There is good facial symmetry.  Speech is fluent and clear. Soft palate rises symmetrically and there is no tongue deviation. Hearing is intact to conversational tone. Tone: Tone is good throughout. Sensation: Sensation is intact to light touch throughout. Coordination:  The patient has no dysdiadichokinesia or dysmetria. Motor: Strength is 5/5 in the bilateral upper and lower  extremities.  Shoulder shrug is equal bilaterally.  There is no pronator drift.  There are no fasciculations noted. Gait and Station: The patient is able to ambulate without difficulty.   MOVEMENT EXAM: Tremor:  There is mild tremor of the outstretched hands, right more than L.  This is slightly  worse with intention.  She is actually improved today compared to previous visits.  Labs:  Lab Results  Component Value Date   TSH 0.862 02/08/2014     Chemistry      Component Value Date/Time   NA 139 02/21/2017 0842   K 4.1 02/21/2017 0842   CL 105 02/21/2017 0842   CO2 27 02/21/2017 0842   BUN 7 02/21/2017 0842   CREATININE 0.85 02/21/2017 0842   CREATININE 0.77 10/05/2013 1526      Component Value Date/Time   CALCIUM 9.1 02/21/2017 0842   ALKPHOS 120 03/16/2017 1318   AST 32 03/16/2017 1318   ALT 37 03/16/2017 1318   BILITOT 0.4 03/16/2017 1318        Lab Results  Component Value Date   WBC 7.5 08/19/2016   HGB 13.2 08/19/2016   HCT 41.7 08/19/2016   MCV 91.6 08/19/2016   PLT 218 08/19/2016   Lab Results  Component Value Date   TSH 0.862 02/08/2014   Lab Results  Component Value Date   VITAMINB12 1,299 (H) 02/21/2017   Lab Results  Component Value Date   HGBA1C 6.2 07/08/2016      Assessment/Plan:   1.  Essential Tremor.  --She finally feels that she has good control on her essential tremor and really does not want to change medications.  I talked to her about the fact that she has had intermittent bradycardia, which is likely due to her Inderal LA.  She insists that she has had no signs or symptoms of this and really does not want to change it.  She states that it took a long time for her tremor to be well controlled  and would like to not make changes.  She will certainly let me know if she has any lightheadedness or near syncopal feelings.  For now, she opted to remain on Inderal LA, 120 mg daily.  She is also on Artane, 2 mg once per day as well as clonazepam  0.5 mg, 1 tablet in the morning and half a tablet at night.    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  I did tell her that this is unlikely to improve with medication therapy.  -I encouraged the patient to get the flu shot and discussed the reasoning behind this.  She is adamant that she does not want it.  2.  Mild B12 deficiency.  -back on b12 supplement 3.  Depression  -no SI/HI 4.  Non alcoholic fatty liver disease  -due to consumption of fast foods/poor diet .  Liver function studies are markedly improved with improvement of diet and discontinuation of sodas. 5.   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  She denies any depression.  She just states that she has crying for no reason.  I told her we could try Nuedexta, but ultimately she decided that she did not want any further medication. 6.  Follow up is anticipated in the next few months, sooner should new neurologic issues arise.  Much greater than 50% of this visit was spent in counseling and coordinating care.  Total face to face time:  25 min

## 2017-05-24 ENCOUNTER — Encounter: Payer: Self-pay | Admitting: Neurology

## 2017-05-24 ENCOUNTER — Ambulatory Visit: Payer: Medicare HMO | Admitting: Neurology

## 2017-05-24 ENCOUNTER — Telehealth: Payer: Self-pay

## 2017-05-24 VITALS — BP 110/62 | HR 50 | Ht 65.5 in | Wt 154.0 lb

## 2017-05-24 DIAGNOSIS — G250 Essential tremor: Secondary | ICD-10-CM | POA: Diagnosis not present

## 2017-05-24 DIAGNOSIS — F482 Pseudobulbar affect: Secondary | ICD-10-CM

## 2017-05-24 DIAGNOSIS — R69 Illness, unspecified: Secondary | ICD-10-CM | POA: Diagnosis not present

## 2017-05-24 MED ORDER — TRIHEXYPHENIDYL HCL 2 MG PO TABS: 2.0000 mg | ORAL_TABLET | Freq: Every day | ORAL | 1 refills | Status: DC

## 2017-05-24 NOTE — Telephone Encounter (Signed)
LVM for patient callback for results per Dr. Vicente Males.    - Inform Hep B serology and AMA negative. Positive f actin antibody which we would not need to do anything about right now due to normal transaminases. Will discuss further at office visit

## 2017-05-24 NOTE — Patient Instructions (Signed)
1.  Drink plenty of water (not soda) 2.  Let me know if you feel like you are lightheaded, near passing out, etc

## 2017-06-13 ENCOUNTER — Encounter: Payer: Self-pay | Admitting: Gastroenterology

## 2017-06-13 ENCOUNTER — Ambulatory Visit (INDEPENDENT_AMBULATORY_CARE_PROVIDER_SITE_OTHER): Payer: Medicare HMO | Admitting: Gastroenterology

## 2017-06-13 VITALS — BP 129/70 | HR 60 | Temp 98.4°F | Ht 65.5 in | Wt 154.0 lb

## 2017-06-13 DIAGNOSIS — R1310 Dysphagia, unspecified: Secondary | ICD-10-CM

## 2017-06-13 NOTE — Progress Notes (Signed)
Jonathon Bellows MD, MRCP(U.K) [facility]  [place], [place] [address]  Main: [phone]  Fax: [phone]   Primary Care Physician: Jinny Sanders, MD  Primary Gastroenterologist:  Dr. Jonathon Bellows   No chief complaint on file.   HPI: Ginelle Bays is a 72 y.o. female    HPI: Bryssa Tones is a 72 y.o. female is here today to follow up for abnormal LFT's which I felt was most likely secondary to NAFLD,GERD.   Summary of history : She was initially seen on 09/14/16 for abnormal LFT's .RUQ USG 07/2016 showed fatty infiltration of the liver .She underwent a laparoscopic cholecystectomy on 08/23/16 H/o of consuming a lot of fast food. She has gained weight over the years, after her age of 72 gained more weight . She has a family history of central obesity . Grandmother was diabetic. She does have high cholesterol.Autoimmune work up negative .  Not immune to hepatitis B  Interval history 03/01/17-05/2017   Labs 02/2017 - LFt-normal . B12 normal .Low vitamin D, repeat F actin and AMA-negative.    EGD 02/2017 - esophageal bx shows features of GERD and esophagitis. Mild schatzkis ring which was dilated to 18 mm   LFT's have resolved. No change of weight   Says swallowing is much better but still feels some kind of obstruction when she swallows. Occasional issues with her pills.   She admits not taking her PPI.   Current Outpatient Medications  Medication Sig Dispense Refill  . cetirizine (ZYRTEC) 10 MG tablet Take 10 mg by mouth daily.    . clonazePAM (KLONOPIN) 0.5 MG tablet TAKE 1 TAB BY MOUTH EVERY MORNING AND 1/2 TAB NIGHTLY AT BEDTIME 45 tablet 2  . montelukast (SINGULAIR) 10 MG tablet Take 1 tablet (10 mg total) by mouth at bedtime. 90 tablet 3  . propranolol ER (INDERAL LA) 120 MG 24 hr capsule Take 1 capsule (120 mg total) by mouth daily. 90 capsule 3  . trihexyphenidyl (ARTANE) 2 MG tablet Take 1 tablet (2 mg total) by mouth at bedtime. 90  tablet 1  . valACYclovir (VALTREX) 1000 MG tablet Take 2 tabs every 12 hours x 1 day as needed for cold sores (Patient not taking: Reported on 05/24/2017) 20 tablet 0   No current facility-administered medications for this visit.     Allergies as of 06/13/2017 - Review Complete 05/24/2017  Allergen Reaction Noted  . Tylenol [acetaminophen] Other (See Comments) 08/16/2016  . Codeine Nausea And Vomiting 02/06/2007  . Topamax [topiramate] Other (See Comments) 04/08/2015    ROS:  General: Negative for anorexia, weight loss, fever, chills, fatigue, weakness. ENT: Negative for hoarseness, difficulty swallowing , nasal congestion. CV: Negative for chest pain, angina, palpitations, dyspnea on exertion, peripheral edema.  Respiratory: Negative for dyspnea at rest, dyspnea on exertion, cough, sputum, wheezing.  GI: See history of present illness. GU:  Negative for dysuria, hematuria, urinary incontinence, urinary frequency, nocturnal urination.  Endo: Negative for unusual weight change.    Physical Examination:   There were no vitals taken for this visit.  General: Well-nourished, well-developed in no acute distress.  Eyes: No icterus. Conjunctivae pink. Mouth: Oropharyngeal mucosa moist and pink , no lesions erythema or exudate. Lungs: Clear to auscultation bilaterally. Non-labored. Heart: Regular rate and rhythm, no murmurs rubs or gallops.  Abdomen: Bowel sounds are normal, nontender, nondistended, no hepatosplenomegaly or masses, no abdominal bruits or hernia , no rebound or guarding.   Extremities: No lower  extremity edema. No clubbing or deformities. Neuro: Alert and oriented x 3.  Grossly intact. Skin: Warm and dry, no jaundice.   Psych: Alert and cooperative, normal mood and affect.   Imaging Studies: No results found.  Assessment and Plan:   Paulyne Mooty is a 72 y.o. y/o female here to follow up for abnormal LFT's due to NAFLD .LFT's have resolved.  H/o dysphagia,  S/p dilation of schatzkis ring, doing better but still has some issues with swallowing . Esophagitis on biopsies, not taking her PPI.   Plan  1. Dexilant 60 mg once daily - samples provided for 2 weeks. If no better with dexilant then will consider barium swallow +/- manometry or empiric Rx for functional dysphagia.    Dr Jonathon Bellows  MD,MRCP [facility]) Follow up in 3 months

## 2017-06-27 ENCOUNTER — Encounter: Payer: Self-pay | Admitting: Family Medicine

## 2017-06-27 ENCOUNTER — Other Ambulatory Visit: Payer: Self-pay | Admitting: Neurology

## 2017-06-27 ENCOUNTER — Other Ambulatory Visit: Payer: Self-pay | Admitting: Family Medicine

## 2017-06-27 NOTE — Telephone Encounter (Signed)
Copied from [place]. Topic: General - Other >> Jun 27, 2017 11:57 AM Neva Seat wrote: Propranolol ER 120mg   Pt is needing to be prescribed the cheep version - Regular kind (not Er) due to insurance cost.  Dr Diona Browner Advised pt this will work for pt. Pt is out after tomorrow.  [id number] [place] [address] [place] [address] Phone: [phone] Fax: [phone]

## 2017-06-27 NOTE — Telephone Encounter (Signed)
Propranolol ER 120 mg   Pt is needing to be prescribed the cheap version-Regular kind (not ER) due to insurance cost.     [id number] [place], [place]    Phone [phone].   Fax [phone]

## 2017-06-28 ENCOUNTER — Encounter: Payer: Self-pay | Admitting: Family Medicine

## 2017-06-28 MED ORDER — PROPRANOLOL HCL 60 MG PO TABS: 60.0000 mg | ORAL_TABLET | Freq: Two times a day (BID) | ORAL | 3 refills | Status: DC

## 2017-06-28 NOTE — Telephone Encounter (Signed)
Ok to send in Propranolol 60 mg one tablet twice a day in place of Propranolol ER 120 mg daily?

## 2017-06-28 NOTE — Addendum Note (Signed)
Addended by: Carter Kitten on: 06/28/2017 02:07 PM   Modules accepted: Orders

## 2017-06-28 NOTE — Telephone Encounter (Signed)
Okay to refill as requested for 1 year.

## 2017-08-12 DIAGNOSIS — C44719 Basal cell carcinoma of skin of left lower limb, including hip: Secondary | ICD-10-CM | POA: Diagnosis not present

## 2017-08-12 DIAGNOSIS — D485 Neoplasm of uncertain behavior of skin: Secondary | ICD-10-CM | POA: Diagnosis not present

## 2017-08-12 DIAGNOSIS — L820 Inflamed seborrheic keratosis: Secondary | ICD-10-CM | POA: Diagnosis not present

## 2017-08-22 ENCOUNTER — Encounter: Payer: Self-pay | Admitting: Neurology

## 2017-09-05 ENCOUNTER — Ambulatory Visit: Payer: Medicare HMO | Admitting: Gastroenterology

## 2017-09-05 ENCOUNTER — Encounter: Payer: Self-pay | Admitting: Gastroenterology

## 2017-09-05 VITALS — BP 122/71 | HR 60 | Ht 65.5 in | Wt 155.6 lb

## 2017-09-05 DIAGNOSIS — R1310 Dysphagia, unspecified: Secondary | ICD-10-CM

## 2017-09-05 NOTE — Progress Notes (Signed)
Jonathon Bellows MD, MRCP(U.K) [address]  [place],  [address]  Main: [phone]  Fax: [phone] Primary Care Physician: Jinny Sanders, MD  Primary Gastroenterologist:  Dr. Jonathon Bellows   Chief Complaint  Patient presents with  . Follow-up    HPI: Michelle Donovan is a 72 y.o. female   Michelle Donovan a 72 y.o.femaleis here today to follow up for dysphagia . H/o NAFLD related abnormal LFT's and GERD.   Summary of history : She was initially seen on 09/14/16 for abnormal LFT's .RUQ USG 07/2016 showed fatty infiltration of the liver .She underwent a laparoscopic cholecystectomy on 08/23/16 H/o of consuming a lot of fast food. She has gained weight over the years, after her age of 8 gained more weight . She has a family history of central obesity . Grandmother was diabetic. She does have high cholesterol.Autoimmune work up negative .  Not immune to hepatitis B Labs 02/2017 - LFt-normal . B12 normal .Low vitamin D, repeat F actin and AMA-negative.  EGD 02/2017 - esophageal bx shows features of GERD and esophagitis. 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 Still has the sensation of something in her throat . She takes her PPI daily. Not using a wedge pillow.     Current Outpatient Medications  Medication Sig Dispense Refill  . cetirizine (ZYRTEC) 10 MG tablet Take 10 mg by mouth daily.    . clonazePAM (KLONOPIN) 0.5 MG tablet TAKE 1 TAB BY MOUTH EVERY MORNING AND 1/2 TAB NIGHTLY AT BEDTIME 45 tablet 5  . montelukast (SINGULAIR) 10 MG tablet Take 1 tablet (10 mg total) by mouth at bedtime. 90 tablet 3  . propranolol (INDERAL) 60 MG tablet Take 1 tablet (60 mg total) by mouth 2 (two) times daily. 180 tablet 3  . trihexyphenidyl (ARTANE) 2 MG tablet Take 1 tablet (2 mg total) by mouth at bedtime. 90 tablet 1  . valACYclovir (VALTREX) 1000 MG tablet Take 2 tabs every 12  hours x 1 day as needed for cold sores (Patient not taking: Reported on 06/13/2017) 20 tablet 0   No current facility-administered medications for this visit.     Allergies as of 09/05/2017 - Review Complete 06/13/2017  Allergen Reaction Noted  . Tylenol [acetaminophen] Other (See Comments) 08/16/2016  . Codeine Nausea And Vomiting 02/06/2007  . Topamax [topiramate] Other (See Comments) 04/08/2015    ROS:  General: Negative for anorexia, weight loss, fever, chills, fatigue, weakness. ENT: Negative for hoarseness, difficulty swallowing , nasal congestion. CV: Negative for chest pain, angina, palpitations, dyspnea on exertion, peripheral edema.  Respiratory: Negative for dyspnea at rest, dyspnea on exertion, cough, sputum, wheezing.  GI: See history of present illness. GU:  Negative for dysuria, hematuria, urinary incontinence, urinary frequency, nocturnal urination.  Endo: Negative for unusual weight change.    Physical Examination:   BP 122/71 (BP Location: Left Arm, Patient Position: Sitting, Cuff Size: Normal)   Pulse 60   Ht 5' 5.5" (1.664 m)   Wt 155 lb 9.6 oz (70.6 kg)   BMI 25.50 kg/m   General: Well-nourished, well-developed in no acute distress.  Eyes: No icterus. Conjunctivae pink. Mouth: Oropharyngeal mucosa moist and pink , no lesions erythema or exudate. Lungs: Clear to auscultation bilaterally. Non-labored. Heart: Regular rate and rhythm, no murmurs rubs or gallops.  Abdomen: Bowel sounds are normal, nontender, nondistended, no hepatosplenomegaly or masses, no abdominal  bruits or hernia , no rebound or guarding.   Extremities: No lower extremity edema. No clubbing or deformities. Neuro: Alert and oriented x 3.  Grossly intact. Skin: Warm and dry, no jaundice.   Psych: Alert and cooperative, normal mood and affect.   Imaging Studies: No results found.  Assessment and Plan:   Michelle Donovan is a 72 y.o. y/o female here to follow up for dysphagia S/p  dilaiton . Still has symptoms suggestive of a Globus . Advised further testing with manometry and Ph but she is not keen. She will try to lose weight , take her PPI, use a wedge pillow and come back to my office if worse.     Dr Jonathon Bellows  MD,MRCP [facility]) Follow up in PRN

## 2017-09-05 NOTE — Progress Notes (Signed)
Michelle Donovan states that the Kappa is working. She's trying to remember to take the med 30 minutes prior to eating. When she takes her pill medication there is still a sensation of obstruction. Otherwise, she feels fine.

## 2017-10-04 ENCOUNTER — Encounter: Payer: Self-pay | Admitting: Family Medicine

## 2017-10-04 ENCOUNTER — Ambulatory Visit: Payer: Medicare HMO | Admitting: Family Medicine

## 2017-10-04 ENCOUNTER — Other Ambulatory Visit: Payer: Self-pay | Admitting: *Deleted

## 2017-10-04 DIAGNOSIS — H6983 Other specified disorders of Eustachian tube, bilateral: Secondary | ICD-10-CM | POA: Diagnosis not present

## 2017-10-04 MED ORDER — PREDNISONE 20 MG PO TABS: ORAL_TABLET | ORAL | 0 refills | Status: DC

## 2017-10-04 NOTE — Progress Notes (Signed)
   Subjective:    Patient ID: Michelle Donovan, female    DOB: [birth year], 72 y.o.   MRN: [id number]  Otalgia   There is pain in both ears. This is a new problem. The current episode started in the past 7 days. The problem has been waxing and waning. There has been no fever. The pain is mild. Associated symptoms include coughing and a sore throat. Pertinent negatives include no ear discharge, hearing loss or rash. Associated symptoms comments:  Nasal congestion, has chronic intermittent scratchy throat and post nasal drip. She has tried nothing for the symptoms. There is no history of a chronic ear infection, hearing loss or a tympanostomy tube.   Going to allergy specialist.. Taking zyrtec and Singulair.   Review of Systems  HENT: Positive for ear pain and sore throat. Negative for ear discharge and hearing loss.   Respiratory: Positive for cough.   Skin: Negative for rash.   Blood pressure 110/60, pulse (!) 55, temperature 97.8 F (36.6 C), temperature source Oral, height 5' 4.75" (1.645 m), weight 155 lb (70.3 kg).     Objective:   Physical Exam  Constitutional: Vital signs are normal. She appears well-developed and well-nourished. She is cooperative.  Non-toxic appearance. She does not appear ill. No distress.  HENT:  Head: Normocephalic.  Right Ear: Hearing, external ear and ear canal normal. Tympanic membrane is not erythematous, not retracted and not bulging. A middle ear effusion is present.  Left Ear: Hearing, external ear and ear canal normal. Tympanic membrane is not erythematous, not retracted and not bulging. A middle ear effusion is present.  Nose: Mucosal edema and rhinorrhea present. Right sinus exhibits no maxillary sinus tenderness and no frontal sinus tenderness. Left sinus exhibits no maxillary sinus tenderness and no frontal sinus tenderness.  Mouth/Throat: Uvula is midline, oropharynx is clear and moist and mucous membranes are normal.  Eyes: Pupils are equal,  round, and reactive to light. Conjunctivae, EOM and lids are normal. Lids are everted and swept, no foreign bodies found.  Neck: Trachea normal and normal range of motion. Neck supple. Carotid bruit is not present. No thyroid mass and no thyromegaly present.  Cardiovascular: Normal rate, regular rhythm, S1 normal, S2 normal, normal heart sounds, intact distal pulses and normal pulses. Exam reveals no gallop and no friction rub.  No murmur heard. Pulmonary/Chest: Effort normal and breath sounds normal. No tachypnea. No respiratory distress. She has no decreased breath sounds. She has no wheezes. She has no rhonchi. She has no rales.  Neurological: She is alert.  Skin: Skin is warm, dry and intact. No rash noted.  Psychiatric: Her speech is normal and behavior is normal. Judgment normal. Her mood appears not anxious. Cognition and memory are normal. She does not exhibit a depressed mood.          Assessment & Plan:

## 2017-10-04 NOTE — Patient Instructions (Signed)
Complete a course of prednisone., Continue allergy medications. Can also start Flonase 2 sprays per nostril daily once prednisone taper is done.

## 2017-10-04 NOTE — Assessment & Plan Note (Signed)
Complete a course of prednisone., Continue allergy medications. Can also start Flonase 2 sprays per nostril daily once prednisone taper is done.

## 2017-10-26 DIAGNOSIS — R69 Illness, unspecified: Secondary | ICD-10-CM | POA: Diagnosis not present

## 2017-10-26 NOTE — Progress Notes (Deleted)
Subjective:    Michelle Donovan was seen in consultation in the movement disorder clinic at the request of Jinny Sanders, MD.  The evaluation is for tremor.  Pt has previously seen Dr. Leta Baptist and notes from 2012 were reviewed.  Pt reports that this was just a one time consultation in 2012.   The patient is a 72 y.o. right handed female with a history of tremor.   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  She states that it is in both hands but R is worse than the L (but she uses the right more). There is a family hx of tremor in her mother and brother.  She may use xanax very if going out (less than one time per month) to suppress tremor.    Affected by caffeine:  yes(drinks 8 oz coffee per da) Affected by alcohol:  unknown Affected by stress:  yes Affected by fatigue:  no Spills soup if on spoon:  yes (uses weighted utensils at home for this) Spills glass of liquid if full:  yes Affects ADL's (tying shoes, brushing teeth, etc):  no except trouble with eyeliner  11/13/13 update:  The patient returns today for follow up.  She was restarted on topamax last visit.  She was doing markedly better when it was first started but she has recently had some thyroid issues (multinodular goiter), pink eye, bilateral frozen shoulders and URI and tremor got worse during this.  She has been worked up for elevated liver enzymes but then repeat enzymes were normal and hepatitis panel was normal.  She had some paresthesias with the topamax on initiation.  She has lost some weight; no longer able to drink her coke as it tastes poorly with the topamax.    02/14/14 update:  Pt returns today for f/u.  Noted hair loss and called here and I d/c topamax 2 days ago.   Originally thought that hair loss due to highlights had put in hair over the summer but then realized that hair loss continued.   Pt frustrated as knew that topamax worked for tremor.   Has noted chin tremor.  Restarted back to work.    05/20/14 update:  Pt has a hx of ET.  Tried to d/c the topamax because of hair loss and start neurontin but tremor was not well controlled on neurontin alone and she really wanted wanted to go back on the topamax, knowing the risks.  She states that it isn't really falling out like it was but it isn't coming back like she hoped either.  She states that tremor isn't well controlled either.  She states that she wants to stay on the gabapentin because it is helping the frozen shoulder.  She is feeling depressed because of the tremor.  Asks if her thyroid nodules could be causing the tremor.  Her mother had a goiter and also had tremor.   02/18/15 update:  The patient presents today for follow-up.  I have not seen her since January, 2016.  She ended up wanting to increase the dose of Topamax, which she did to a total of 200 mg per day.  She sent me an email recently stating that tremor was less well controlled and she was having more hair loss.  She wanted to consider other options.  She admits that stress has increased greatly; her daughter and grandchild have moved back into her home.  She  reports that her daughter makes things very stressful for her.   She is noting tremor with eating, drinking, putting on makeup, writing.   She was on gabapentin for her shoulder pain but she has d/c it now.  She is on propranolol LA 120 mg daily for blood pressure.  We have talked about Artane in the past, but she has not wanted to try that.  She has tried only 1 pill of primidone in the past and it sounds like she had first dose effect and has not wanted to retry that.    05/02/15 update:  The patient is following up today regarding her essential tremor.  She is on Inderal LA, 120 mg daily.  Last visit, we cautiously start a low-dose clonazepam 0.5 mg, 1/2 tablet in the morning and half tablet at night. She states that the medication may be helping but not enough.  Fortunately,  it has not made her sleepy.   Her Topamax was discontinued because of side effects of hair loss. Still very stressed over living situation with daughter/grandson living in home.  Doesn't know what she will be doing for Christmas if anything and seems sad about that.  Her daughter is gone right now and her side of the family is going to [place] for christmas.  08/01/15 update:  The patient is following up today regarding her essential tremor.  She is on Inderal LA, 120 mg daily.  Last visit, we cautiously increase her clonazepam 0.5 mg, so that she is taking 1 tablet in the morning and half a tablet at night.  Unfortunately, despite my urging, she has refused to try primidone, because she had a first dose effect in the past when she took one tablet.  She states today that tremor has not been well controlled; she has occasional good days but they are few and far between.  11/04/15 update:  The patient is following up today regarding her essential tremor.  She is on Inderal LA which was increased last visit to 160 mg daily.  She went down to 120 mg daily because she was sleepy and she didn't think that it was helping much and asked me in an email about focused u/s.  I told her that insurance doesn't pay for this right now even though it was FDA approved.  She remains on clonazepam 0.5 mg, so that she is taking 1 tablet in the morning and half a tablet at night.  Unfortunately, despite my urging, she has refused to try primidone, because she had a first dose effect in the past when she took one tablet.  She states today that she continues to have tremor.  She is on prednisone currently because of "inflammation" in the top of the L foot.  She doesn't think that prednisone has changed tremor.   07/08/16 update:  Patient follows up today.  I have not seen her in about 8 months.  I did review her records since last visit.  I cautiously tried her on Artane last visit.  She emailed me in July to state that she thought that  the medication made her tremor worse.  It turns out that when she started the Artane, she stopped her clonazepam, which was not the intention, and is likely the reason that tremor increased.  I asked her to start the clonazepam again, 0.5 mg, one tablet in the morning and half a tablet at night.  I offered her an opinion at an academic center but she declined that.  She states that she cut back on the artane to 1/2 tablet (of the 2mg ) at night because of some dizziness and it seems to be helping some.  She is still on the Inderal LA, 120 g daily.  She denies hallucinations.  She denies falls.  She denies lightheadedness or near syncope.  11/18/16 update:  Pt seen in f/u.   Is on artane 1mg  qhs, inderal LA 120 mg and klonopin 0.5 mg, 1 in the AM and 1/2 at night. Tremor has been about the same.  Has better and worse days.   [facility] changed klonopin manufacturers and the new one isn't as good and more trouble with cutting this new one.   The records that were made available to me were reviewed.  LFTs have been elevated and evaluated.  Fatty liver and gallstones notes on u/s.  Had cholecystectomy on 08/23/16.  Saw GI in May and felt that etiology was Non alcoholic fatty liver from dietary consumption of fast foods daily.  She was referred to dietician and saw her on 11/12/16.  05/24/17 update: Patient is seen today in follow-up for tremor.  She remains on trihexyphenidyl, 1 mg at night.  She is also on Inderal LA, 120 mg daily and clonazepam 0.5 mg, 1 tablet in the morning and half a tablet at night.  Tremor is well controlled "and I can write."  She is adament that she doesn't want to change meds even knowing her pulse.  I have reviewed her records since last visit.  She has been seen by gastroenterology for dysphasia since our last visit.  She had an EGD on March 09, 2017.  Mild Schatzki's ring was noted and was dilated. She isn't sure it helped.  She does state that she doesn't get choked as much but pills will get  stuck.   Liver function testing was much improved 3 months ago compared to 8 months ago.  She attributes this to giving up dark sodas.  She is still eating fast food but not as much as previously  10/28/17 update: Patient is seen today for follow-up for tremor.  She is on Artane, 2 mg nightly as well as Inderal LA, 120 mg daily and clonazepam, 0.5 mg, 1 tablet in the morning and half tablet at night.  I have reviewed records since last visit.  Patient saw primary care physician on Oct 04, 2017.  This was just for ear pain and she was given prednisone and told to try Flonase.  She saw her gastroenterologist in January, 2019.  Liver enzymes had normalized.  Current/Previously tried tremor medications: topamax;  Metoprolol - felt drained ; primidone (50 mg - 1/2 at night but got first dose effect and stopped it); meclizine (? Why); on propranolol now - ? Helping now; gabapentin; artane  Current medications that may exacerbate tremor:  n/a  Outside reports reviewed: historical medical records and referral letter/letters.  Allergies  Allergen Reactions  . Tylenol [Acetaminophen] Other (See Comments)    Increased liver enzymes  . Codeine Nausea And Vomiting  . Topamax [Topiramate] Other (See Comments)    Hair loss    Current Outpatient Medications on File Prior to Visit  Medication Sig Dispense Refill  . cetirizine (ZYRTEC) 10 MG tablet Take 10 mg by mouth daily.    . chlorhexidine (PERIDEX) 0.12 % solution RINSE WITH 1/2OZ FOR 30SECONDS AFTER THOROUGH BRISHING 3 TIMES DAILY  99  . clonazePAM (KLONOPIN) 0.5 MG tablet TAKE 1 TAB BY MOUTH EVERY MORNING AND  1/2 TAB NIGHTLY AT BEDTIME 45 tablet 5  . montelukast (SINGULAIR) 10 MG tablet Take 1 tablet (10 mg total) by mouth at bedtime. 90 tablet 3  . predniSONE (DELTASONE) 20 MG tablet 3 tabs by mouth daily x 3 days, then 2 tabs by mouth daily x 2 days then 1 tab by mouth daily x 2 days 15 tablet 0  . propranolol (INDERAL) 60 MG tablet Take 1 tablet (60  mg total) by mouth 2 (two) times daily. 180 tablet 3  . trihexyphenidyl (ARTANE) 2 MG tablet Take 1 tablet (2 mg total) by mouth at bedtime. 90 tablet 1  . valACYclovir (VALTREX) 1000 MG tablet Take 2 tabs every 12 hours x 1 day as needed for cold sores 20 tablet 0   No current facility-administered medications on file prior to visit.     Past Medical History:  Diagnosis Date  . Allergic rhinitis, cause unspecified   . Asthma    not bad per pt  . Dermatophytosis of scalp and beard   . Diabetes mellitus without complication ([place])    history of no longer a problem  . Diverticulosis   . Essential and other specified forms of tremor   . HTN (hypertension)   . Irritable bowel syndrome   . Lumbago    bulging disk per MRI   . Mixed hyperlipidemia   . Other acute reactions to stress   . Other diseases of lung, not elsewhere classified    solitary pulm. nodule(left)  . PONV (postoperative nausea and vomiting)   . Thyroid nodule   . Unspecified asthma(493.90)   . Unspecified vitamin D deficiency     Past Surgical History:  Procedure Laterality Date  . ABDOMINAL HYSTERECTOMY    . CHOLECYSTECTOMY N/A 08/23/2016   Procedure: LAPAROSCOPIC CHOLECYSTECTOMY;  Surgeon: Clovis Riley, MD;  Location: [facility];  Service: General;  Laterality: N/A;  . COLONOSCOPY    . ESOPHAGOGASTRODUODENOSCOPY (EGD) WITH PROPOFOL N/A 03/09/2017   Procedure: ESOPHAGOGASTRODUODENOSCOPY (EGD) WITH PROPOFOL;  Surgeon: Jonathon Bellows, MD;  Location: [facility] ENDOSCOPY;  Service: Gastroenterology;  Laterality: N/A;  . LAPAROSCOPIC TOTAL HYSTERECTOMY  2002  . LAPAROSCOPIC UNILATERAL SALPINGO OOPHERECTOMY    . TONSILLECTOMY  1967/68  . TUBAL LIGATION  1986    Social History   Socioeconomic History  . Marital status: Divorced    Spouse name: Not on file  . Number of children: 1  . Years of education: Not on file  . Highest education level: Not on file  Occupational History  . Occupation: retired    Fish farm manager: OTHER     Comment: [place]: [place]  . Occupation: part time    Employer: [employer]  . Financial resource strain: Not on file  . Food insecurity:    Worry: Not on file    Inability: Not on file  . Transportation needs:    Medical: Not on file    Non-medical: Not on file  Tobacco Use  . Smoking status: Former Smoker    Types: Cigarettes    Last attempt to quit: 05/17/1980    Years since quitting: 37.4  . Smokeless tobacco: Never Used  Substance and Sexual Activity  . Alcohol use: No  . Drug use: No  . Sexual activity: Never  Lifestyle  . Physical activity:    Days per week: Not on file    Minutes per session: Not on file  . Stress: Not on file  Relationships  .  Social connections:    Talks on phone: Not on file    Gets together: Not on file    Attends religious service: Not on file    Active member of club or organization: Not on file    Attends meetings of clubs or organizations: Not on file    Relationship status: Not on file  . Intimate partner violence:    Fear of current or ex partner: Not on file    Emotionally abused: Not on file    Physically abused: Not on file    Forced sexual activity: Not on file  Other Topics Concern  . 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 Mother  Deceased at age 2       CHF, Essential Tremor  . Father  Deceased       old age, heart disease  . Brother  Alive       diabetes  . Brother  Alive       COPD, heart disease  . Brother  Alive       Benign Essential Tremor  . Sister  Alive       healthy  . Daughter  Alive       anxiety  . Sister  (Not Specified)  . Sister  (Not Specified)  . Sister  (Not Specified)  . Sister  (Not Specified)  . Brother  (Not Specified)  . Brother  (Not Specified)  . Brother  (Not  Specified)  . Brother  (Not Specified)  . Unknown  (Not Specified)  . MGF  (Not Specified)    Review of Systems A complete 10 system ROS was obtained and was negative apart from what is mentioned.   Objective:   VITALS:   There were no vitals filed for this visit. Wt Readings from Last 3 Encounters:  10/04/17 155 lb (70.3 kg)  09/05/17 155 lb 9.6 oz (70.6 kg)  06/13/17 154 lb (69.9 kg)    Gen:  Appears stated age and in NAD.  Flat affect today  HEENT:  Normocephalic, atraumatic. The mucous membranes are moist. The superficial temporal arteries are without ropiness or tenderness. Cardiovascular: Regular rate and rhythm. Lungs: Clear to auscultation bilaterally. Neck: There are no carotid bruits noted bilaterally.  NEUROLOGICAL:  Orientation:  The patient is alert and oriented x 3.   Cranial nerves: There is good facial symmetry.  Speech is fluent and clear. Soft palate rises symmetrically and there is no tongue deviation. Hearing is intact to conversational tone. Tone: Tone is good throughout. Sensation: Sensation is intact to light touch throughout. Coordination:  The patient has no dysdiadichokinesia or dysmetria. Motor: Strength is 5/5 in the bilateral upper and lower extremities.  Shoulder shrug is equal bilaterally.  There is no pronator drift.  There are no fasciculations noted. Gait and Station: The patient is able to ambulate without difficulty.   MOVEMENT EXAM: Tremor:  There is mild tremor of the outstretched hands, right more than L.  This is slightly  worse with intention.  She is actually improved today compared to previous visits.  Labs:  Lab Results  Component Value Date   TSH 0.862 02/08/2014     Chemistry  Component Value Date/Time   NA 139 02/21/2017 0842   K 4.1 02/21/2017 0842   CL 105 02/21/2017 0842   CO2 27 02/21/2017 0842   BUN 7 02/21/2017 0842   CREATININE 0.85 02/21/2017 0842   CREATININE 0.77 10/05/2013 1526      Component Value  Date/Time   CALCIUM 9.1 02/21/2017 0842   ALKPHOS 120 03/16/2017 1318   AST 32 03/16/2017 1318   ALT 37 03/16/2017 1318   BILITOT 0.4 03/16/2017 1318        Lab Results  Component Value Date   WBC 7.5 08/19/2016   HGB 13.2 08/19/2016   HCT 41.7 08/19/2016   MCV 91.6 08/19/2016   PLT 218 08/19/2016   Lab Results  Component Value Date   TSH 0.862 02/08/2014   Lab Results  Component Value Date   TMAUQJFH54 5,625 (H) 02/21/2017   Lab Results  Component Value Date   HGBA1C 6.2 07/08/2016      Assessment/Plan:   1.  Essential Tremor.  --She finally feels that she has good control on her essential tremor and really does not want to change medications.  I talked to her about the fact that she has had intermittent bradycardia, which is likely due to her Inderal LA.  She insists that she has had no signs or symptoms of this and really does not want to change it.  She states that it took a long time for her tremor to be well controlled and would like to not make changes.  She will certainly let me know if she has any lightheadedness or near syncopal feelings.  For now, she opted to remain on Inderal LA, 120 mg daily.  She is also on Artane, 2 mg once per day as well as clonazepam 0.5 mg, 1 tablet in the morning and half a tablet at night.    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  I did tell her that this is unlikely to improve with medication therapy.  -I encouraged the patient to get the flu shot and discussed the reasoning behind this.  She is adamant that she does not want it.  2.  Mild B12 deficiency.  -back on b12 supplement 3.  Depression  -no SI/HI 4.  Non alcoholic fatty liver disease  -due to consumption of fast foods/poor diet .  Liver function studies are markedly improved with improvement of diet and discontinuation of sodas. 5.   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  She denies any depression.  She  just states that she has crying for no reason.  I told her we could try Nuedexta, but ultimately she decided that she did not want any further medication. 6.  Follow up is anticipated in the next few months, sooner should new neurologic issues arise.  Much greater than 50% of this visit was spent in counseling and coordinating care.  Total face to face time:  25 min

## 2017-10-28 ENCOUNTER — Ambulatory Visit: Payer: Medicare HMO | Admitting: Neurology

## 2017-10-31 ENCOUNTER — Telehealth: Payer: Self-pay

## 2017-10-31 NOTE — Telephone Encounter (Signed)
PLEASE NOTE: All timestamps contained within this report are represented as [place] Standard Time. CONFIDENTIALTY NOTICE: This fax transmission is intended only for the addressee. It contains information that is legally privileged, confidential or otherwise protected from use or disclosure. If you are not the intended recipient, you are strictly prohibited from reviewing, disclosing, copying using or disseminating any of this information or taking any action in reliance on or regarding this information. If you have received this fax in error, please notify us immediately by telephone so that we can arrange for its return to [place]. Phone: [phone], Toll-Free: [phone], Fax: [phone] Page: 1 of 2 Call Id: [id number] [place] Patient Name: Michelle Donovan Gender: Female DOB: 1945-06-27 Age: 72 Y 10 M 12 D Return Phone Number: [phone] (Primary) Address: City/State/Zip: [place] [address] Client [facility] Night - Client Client Site [place] Physician Eliezer Lofts - MD Contact Type Call Who Is Calling Patient / Member / Family / Caregiver Call Type Triage / Clinical Relationship To Patient Self Return Phone Number [phone] (Primary) Chief Complaint NOSEBLEED - >30 minutes Reason for Call Symptomatic / Request for [place] states she has been having really bad nose bleeds since yesterday afternoon and today. She states she just passed a large sized blood clot. She states it has been gushing and dripping. She is also nauseous. Translation No Nurse Assessment Nurse: White, Therapist, sports, Doug Date/Time (Eastern Time): 10/29/2017 9:55:00 PM Confirm and document reason for call. If symptomatic, describe symptoms. ---Caller states that she had left epistaxis yesterday with some clots, which she stopped. Today,  appox 15 min ago she experienced left epistaxis again with a large clot and now the bleeding is controlled. She reports some nausea and denies vomiting. Denies cough, fever, congestion or use of anticoagulants. Does the patient have any new or worsening symptoms? ---Yes Will a triage be completed? ---Yes Related visit to physician within the last 2 weeks? ---No Does the PT have any chronic conditions? (i.e. diabetes, asthma, etc.) ---Yes List chronic conditions. ---Asthma, elevated chol, fatty liver. Is this a behavioral health or substance abuse call? ---No Guidelines Guideline Title Affirmed Question Affirmed Notes Nurse Date/Time (Eastern Time) Nosebleed [1] Nosebleed AND [2] bleeding present < 30 minutes AND [3] using correct technique per guideline White, RN, Doug 10/29/2017 9:56:10 PM PLEASE NOTE: All timestamps contained within this report are represented as [place] Standard Time. CONFIDENTIALTY NOTICE: This fax transmission is intended only for the addressee. It contains information that is legally privileged, confidential or otherwise protected from use or disclosure. If you are not the intended recipient, you are strictly prohibited from reviewing, disclosing, copying using or disseminating any of this information or taking any action in reliance on or regarding this information. If you have received this fax in error, please notify us immediately by telephone so that we can arrange for its return to [place]. Phone: [phone], Toll-Free: [phone], Fax: [phone] Page: 2 of 2 Call Id: [id number] [place]. Time Eilene Ghazi Time) Disposition Final User 10/29/2017 9:47:53 PM Send to Urgent Kathalene Frames, [place] 10/29/2017 10:10:29 PM Home Care Yes White, RN, Doug Caller Disagree/Comply Comply Caller Understands Yes PreDisposition [place] Advice Given Per Guideline HOME CARE: You should be able to treat this at home. REASSURANCE AND EDUCATION: * Nosebleeds are common.  * Sounds like you are using the correct technique to stop the bleeding. *  I'll review it with you just to be sure. * Then I want you to try it 1 more time. TREATING A NOSEBLEED - [place] THE NOSTRILS: * First blow the nose to clear out any large clots. * Sit down and lean forward. (Reason: blood makes people choke if they lean backwards). * Gently squeeze the soft parts of the lower nose (nostrils) together. Use your thumb and your index finger in a pinching manner. Do this for 15 minutes. Use a clock or watch to measure the time. (Goal: apply continuous pressure to the bleeding point.) * If the bleeding continues after 15 minutes of squeezing, move your point of pressure and repeat again for another 15 minutes. TREATING A NOSEBLEED - INSERTING A GAUZE WITH DECONGESTANT NOSE DROPS: * If applying pressure fails, insert a gauze wet with decongestant nose drops (or petroleum jelly). (Reason: the gauze helps to apply pressure and the nose drops shrink the blood vessels) * Then repeat the process of gently squeezing the lower nose for 10 minutes. * Example decongestant medicine: Afrin (oxymetazoline) nasal spray is available over-the-counter and is a nasal decongestant. * Read the package instructions thoroughly on all medicines that you use. PREVENT RECURRENT NOSEBLEEDS: * Dry air in your house or workplace can increase the chance of nosebleeds occurring. If the air is dry, use a humidifier in your bedroom to keep the nose from drying out. You can also apply petroleum jelly to the center wall (septum) inside the nose twice daily to reduce cracking and to promote healing. * Bleeding can start again if you rub your nose or blow the nose too hard. Avoid touching your nose and nose picking. Avoid blowing the nose. * Do not take aspirin or other anti-inflammatory medications (e.g., ibuprofen, Advil, Motrin, Aleve), unless you have been instructed to by your physician. EXPECTED COURSE: * Over 99% of nosebleeds  will stop following 15 minutes of direct pressure if you press on the right spot. * After swallowing blood from a nosebleed, you may feel nauseated because the blood can irritate your stomach. You may also pass a dark stool tomorrow. CALL BACK IF: * Nosebleeding lasts longer than 30 minutes with using direct pressure * Lightheadedness or weakness occurs * Nosebleeds become worse * You become worse. CARE ADVICE given per Nosebleed (Adult) guideline.

## 2017-10-31 NOTE — Telephone Encounter (Signed)
Copied from [place] [phone] [phone]. Topic: General - Other >> Oct 31, 2017  9:14 AM Keene Breath wrote: Reason for CRM: Patient returned a call from [place].  She is not sure whey she received the call but left her work # for nurse to call her back.  CB#[phone] ext. [phone].

## 2017-10-31 NOTE — Telephone Encounter (Signed)
I  Was unable to reach pt by phone cell phone had no v/m set up.

## 2017-10-31 NOTE — Telephone Encounter (Signed)
I spoke with pt; pt is doing OK today. Pt had nosebleed on Fri and Sat. Sunday pt had no problem at all. If pt has another nose bleed pt will call to set up appt. FYI to Dr Bedsole/Dr Copland.

## 2017-11-15 ENCOUNTER — Ambulatory Visit (INDEPENDENT_AMBULATORY_CARE_PROVIDER_SITE_OTHER): Payer: Medicare HMO | Admitting: Family Medicine

## 2017-11-15 ENCOUNTER — Encounter: Payer: Self-pay | Admitting: Family Medicine

## 2017-11-15 VITALS — BP 120/60 | HR 72 | Temp 98.1°F | Ht 64.75 in | Wt 159.5 lb

## 2017-11-15 DIAGNOSIS — R040 Epistaxis: Secondary | ICD-10-CM

## 2017-11-15 NOTE — Patient Instructions (Addendum)
Go to the lab on the way out.  We'll contact you with your lab report. Use aftrin if you have another bleed but not otherwise.   If you have another bleed, then we should set you up with ENT.   Take care.  Glad to see you.

## 2017-11-15 NOTE — Progress Notes (Signed)
Epistaxis.  No aspirin or blood thinners.  Sx started about 10/28/17.  Had spontaneous bleed, passed a clot, then resolved.  Then 10/29/17 she had nausea.  Then that PM had another nosebleed.  Then had a small bleed thereafter, a few days later.  Then no bleeding until 11/11/17- on that day had had an episode similar to the first two bleeds.  No trauma.  Always L nostril.  Took about 10 minutes of compression to get it to stop.  Didn't require packing.  No bleeding in the meantime. Still some nausea.  No vomiting. No fevers.  No other bleeding, no blood in urine, stool, etc.  She talked to RN at work about her situation.    She has some fatigue.  She doesn't wake up gasping for air.  She doesn't typically have nosebleeds, prior to all of this.   Meds, vitals, and allergies reviewed.   ROS: Per HPI unless specifically indicated in ROS section   nad ncat Tm wnl Nasal exam wnl except for very small benign appearing scab anterior midline septum on L nostril.  No other scabbing or source of bleeding seen in either nostril. OP wnl Neck supple, no LA

## 2017-11-16 ENCOUNTER — Telehealth: Payer: Self-pay | Admitting: Family Medicine

## 2017-11-16 ENCOUNTER — Encounter: Payer: Self-pay | Admitting: Family Medicine

## 2017-11-16 DIAGNOSIS — R040 Epistaxis: Secondary | ICD-10-CM

## 2017-11-16 LAB — CBC WITH DIFFERENTIAL/PLATELET
BASOS PCT: 1.2 % (ref 0.0–3.0)
Basophils Absolute: 0.1 10*3/uL (ref 0.0–0.1)
EOS ABS: 0.1 10*3/uL (ref 0.0–0.7)
EOS PCT: 1.8 % (ref 0.0–5.0)
HEMATOCRIT: 36.8 % (ref 36.0–46.0)
HEMOGLOBIN: 12.1 g/dL (ref 12.0–15.0)
LYMPHS PCT: 32.9 % (ref 12.0–46.0)
Lymphs Abs: 2.5 10*3/uL (ref 0.7–4.0)
MCHC: 32.8 g/dL (ref 30.0–36.0)
MCV: 87.1 fl (ref 78.0–100.0)
Monocytes Absolute: 0.3 10*3/uL (ref 0.1–1.0)
Monocytes Relative: 4 % (ref 3.0–12.0)
Neutro Abs: 4.6 10*3/uL (ref 1.4–7.7)
Neutrophils Relative %: 60.1 % (ref 43.0–77.0)
Platelets: 212 10*3/uL (ref 150.0–400.0)
RBC: 4.22 Mil/uL (ref 3.87–5.11)
RDW: 13.5 % (ref 11.5–15.5)
WBC: 7.7 10*3/uL (ref 4.0–10.5)

## 2017-11-16 NOTE — Telephone Encounter (Signed)
Patient notified as instructed by telephone and verbalized understanding. Advised patient that one of the referral coordinators will be in touch to get this set up for her.

## 2017-11-16 NOTE — Assessment & Plan Note (Signed)
Unlikely that she has a primary bleeding issue per se but still reasonable to check CBC.  See notes on labs.  Discussed options about ENT referral.  We can either refer now or see if she has another episode.  If she has another episode, then we would refer. She wanted to wait on ENT referral, d/w pt.  See avs.

## 2017-11-16 NOTE — Telephone Encounter (Signed)
Would use afrin as needed as discussed, if she has another bleed.  Refer to ENT.  I put in the order.  See labs.  Cbc is normal.  Thanks.

## 2017-11-16 NOTE — Telephone Encounter (Signed)
Pt last seen 11/15/17; also please see email from pt on 11/16/17.  I spoke with pt and when pt got up this morning and began her regular routine nose bleed started. Pt said only lasted about one minute but pt said there was a substantial amt of blood. Pt has not picked up afrin yet. Pt had no H/A or vision problems this morning. Pt request cb about ENT referral.

## 2017-11-16 NOTE — Telephone Encounter (Signed)
Copied from [place] [id number]. Topic: Quick Communication - See Telephone Encounter >> Nov 16, 2017 10:01 AM Rutherford Nail, NT wrote: CRM for notification. See Telephone encounter for: 11/16/17. Patient calling to inform Dr Damita Dunnings that she did have another nose bleed this morning. States that he told her that if she had another nose bleed that he would like her to see an ENT. CB#: [phone] (work) ext [phone]

## 2017-11-25 DIAGNOSIS — J310 Chronic rhinitis: Secondary | ICD-10-CM | POA: Diagnosis not present

## 2017-11-25 DIAGNOSIS — J342 Deviated nasal septum: Secondary | ICD-10-CM | POA: Diagnosis not present

## 2017-11-25 DIAGNOSIS — R040 Epistaxis: Secondary | ICD-10-CM | POA: Diagnosis not present

## 2017-12-24 ENCOUNTER — Other Ambulatory Visit: Payer: Self-pay | Admitting: Neurology

## 2017-12-26 ENCOUNTER — Other Ambulatory Visit: Payer: Self-pay | Admitting: Neurology

## 2017-12-26 MED ORDER — CLONAZEPAM 0.5 MG PO TABS: ORAL_TABLET | ORAL | 0 refills | Status: DC

## 2017-12-26 NOTE — Telephone Encounter (Signed)
Last seen in January. Appt tomorrow - she did not have an appt this morning (cancelled and did not reschedule) this morning when I originally denied clonazepam. Please advise on refill.

## 2017-12-26 NOTE — Telephone Encounter (Signed)
Please sign order

## 2017-12-26 NOTE — Telephone Encounter (Signed)
Patient is scheduled to see Dr. Carles Collet tomorrow 12/27/2017 and she has been out of her Clonazepam a couple days now. Can she have it called it today at [facility] [address]. Thanks

## 2017-12-26 NOTE — Progress Notes (Signed)
Subjective:    Michelle Donovan was seen in consultation in the movement disorder clinic at the request of Jinny Sanders, MD.  The evaluation is for tremor.  Pt has previously seen Dr. Leta Baptist and notes from 2012 were reviewed.  Pt reports that this was just a one time consultation in 2012.   The patient is a 72 y.o. right handed female with a history of tremor.   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  She states that it is in both hands but R is worse than the L (but she uses the right more). There is a family hx of tremor in her mother and brother.  She may use xanax very if going out (less than one time per month) to suppress tremor.    Affected by caffeine:  yes(drinks 8 oz coffee per da) Affected by alcohol:  unknown Affected by stress:  yes Affected by fatigue:  no Spills soup if on spoon:  yes (uses weighted utensils at home for this) Spills glass of liquid if full:  yes Affects ADL's (tying shoes, brushing teeth, etc):  no except trouble with eyeliner  11/13/13 update:  The patient returns today for follow up.  She was restarted on topamax last visit.  She was doing markedly better when it was first started but she has recently had some thyroid issues (multinodular goiter), pink eye, bilateral frozen shoulders and URI and tremor got worse during this.  She has been worked up for elevated liver enzymes but then repeat enzymes were normal and hepatitis panel was normal.  She had some paresthesias with the topamax on initiation.  She has lost some weight; no longer able to drink her coke as it tastes poorly with the topamax.    02/14/14 update:  Pt returns today for f/u.  Noted hair loss and called here and I d/c topamax 2 days ago.   Originally thought that hair loss due to highlights had put in hair over the summer but then realized that hair loss continued.   Pt frustrated as knew that topamax worked for tremor.   Has noted chin tremor.  Restarted back to work.    05/20/14 update:  Pt has a hx of ET.  Tried to d/c the topamax because of hair loss and start neurontin but tremor was not well controlled on neurontin alone and she really wanted wanted to go back on the topamax, knowing the risks.  She states that it isn't really falling out like it was but it isn't coming back like she hoped either.  She states that tremor isn't well controlled either.  She states that she wants to stay on the gabapentin because it is helping the frozen shoulder.  She is feeling depressed because of the tremor.  Asks if her thyroid nodules could be causing the tremor.  Her mother had a goiter and also had tremor.   02/18/15 update:  The patient presents today for follow-up.  I have not seen her since January, 2016.  She ended up wanting to increase the dose of Topamax, which she did to a total of 200 mg per day.  She sent me an email recently stating that tremor was less well controlled and she was having more hair loss.  She wanted to consider other options.  She admits that stress has increased greatly; her daughter and grandchild have moved back into her home.  She  reports that her daughter makes things very stressful for her.   She is noting tremor with eating, drinking, putting on makeup, writing.   She was on gabapentin for her shoulder pain but she has d/c it now.  She is on propranolol LA 120 mg daily for blood pressure.  We have talked about Artane in the past, but she has not wanted to try that.  She has tried only 1 pill of primidone in the past and it sounds like she had first dose effect and has not wanted to retry that.    05/02/15 update:  The patient is following up today regarding her essential tremor.  She is on Inderal LA, 120 mg daily.  Last visit, we cautiously start a low-dose clonazepam 0.5 mg, 1/2 tablet in the morning and half tablet at night. She states that the medication may be helping but not enough.  Fortunately,  it has not made her sleepy.   Her Topamax was discontinued because of side effects of hair loss. Still very stressed over living situation with daughter/grandson living in home.  Doesn't know what she will be doing for Christmas if anything and seems sad about that.  Her daughter is gone right now and her side of the family is going to [place] for christmas.  08/01/15 update:  The patient is following up today regarding her essential tremor.  She is on Inderal LA, 120 mg daily.  Last visit, we cautiously increase her clonazepam 0.5 mg, so that she is taking 1 tablet in the morning and half a tablet at night.  Unfortunately, despite my urging, she has refused to try primidone, because she had a first dose effect in the past when she took one tablet.  She states today that tremor has not been well controlled; she has occasional good days but they are few and far between.  11/04/15 update:  The patient is following up today regarding her essential tremor.  She is on Inderal LA which was increased last visit to 160 mg daily.  She went down to 120 mg daily because she was sleepy and she didn't think that it was helping much and asked me in an email about focused u/s.  I told her that insurance doesn't pay for this right now even though it was FDA approved.  She remains on clonazepam 0.5 mg, so that she is taking 1 tablet in the morning and half a tablet at night.  Unfortunately, despite my urging, she has refused to try primidone, because she had a first dose effect in the past when she took one tablet.  She states today that she continues to have tremor.  She is on prednisone currently because of "inflammation" in the top of the L foot.  She doesn't think that prednisone has changed tremor.   07/08/16 update:  Patient follows up today.  I have not seen her in about 8 months.  I did review her records since last visit.  I cautiously tried her on Artane last visit.  She emailed me in July to state that she thought that  the medication made her tremor worse.  It turns out that when she started the Artane, she stopped her clonazepam, which was not the intention, and is likely the reason that tremor increased.  I asked her to start the clonazepam again, 0.5 mg, one tablet in the morning and half a tablet at night.  I offered her an opinion at an academic center but she declined that.  She states that she cut back on the artane to 1/2 tablet (of the 2mg ) at night because of some dizziness and it seems to be helping some.  She is still on the Inderal LA, 120 g daily.  She denies hallucinations.  She denies falls.  She denies lightheadedness or near syncope.  11/18/16 update:  Pt seen in f/u.   Is on artane 1mg  qhs, inderal LA 120 mg and klonopin 0.5 mg, 1 in the AM and 1/2 at night. Tremor has been about the same.  Has better and worse days.   [facility] and the new one isn't as good and more trouble with cutting this new one.   The records that were made available to me were reviewed.  LFTs have been elevated and evaluated.  Fatty liver and gallstones notes on u/s.  Had cholecystectomy on 08/23/16.  Saw GI in May and felt that etiology was Non alcoholic fatty liver from dietary consumption of fast foods daily.  She was referred to dietician and saw her on 11/12/16.  05/24/17 update: Patient is seen today in follow-up for tremor.  She remains on trihexyphenidyl, 1 mg at night.  She is also on Inderal LA, 120 mg daily and clonazepam 0.5 mg, 1 tablet in the morning and half a tablet at night.  Tremor is well controlled "and I can write."  She is adament that she doesn't want to change meds even knowing her pulse.  I have reviewed her records since last visit.  She has been seen by gastroenterology for dysphasia since our last visit.  She had an EGD on March 09, 2017.  Mild Schatzki's ring was noted and was dilated. She isn't sure it helped.  She does state that she doesn't get choked as much but pills will get  stuck.   Liver function testing was much improved 3 months ago compared to 8 months ago.  She attributes this to giving up dark sodas.  She is still eating fast food but not as much as previously  12/27/17 update:  Pt seen in f/u for ET.  On artane 1 mg daily.  Was on inderal LA, 120 mg daily but reports that she changed to propranolol 60 mg bid due to cost. She is on  klonopin, 0.5 mg, 1 in the AM and 1/2 tablet at night.  She ran out of klonopin last week and had more tremor then.  Otherwise tremor has been well controlled.  She has had to have 2 teeth extracted and is awaiting implants.  Admits that diet isn't doing well but trying to limit dark sodas.   pt asks me about memory blood test.  C/o not remembering like used to and daughter thinks she should have test.  Is primarily word finding trouble  Current/Previously tried tremor medications: topamax;  Metoprolol - felt drained ; primidone (50 mg - 1/2 at night but got first dose effect and stopped it); meclizine (? Why); on propranolol now - ? Helping now; gabapentin; artane  Current medications that may exacerbate tremor:  n/a  Outside reports reviewed: historical medical records and referral letter/letters.  Allergies  Allergen Reactions  . Tylenol [Acetaminophen] Other (See Comments)    Increased liver enzymes  . Codeine Nausea And Vomiting  . Topamax [Topiramate] Other (See Comments)    Hair loss    Current Outpatient Medications on File Prior to Visit  Medication Sig Dispense Refill  . amoxicillin (AMOXIL) 500 MG capsule Take 1 capsule by  mouth 3 (three) times daily.    . cetirizine (ZYRTEC) 10 MG tablet Take 10 mg by mouth daily.    . clonazePAM (KLONOPIN) 0.5 MG tablet TAKE 1 TAB BY MOUTH EVERY MORNING AND 1/2 TAB NIGHTLY AT BEDTIME 45 tablet 0  . methylPREDNISolone (MEDROL DOSEPAK) 4 MG TBPK tablet     . montelukast (SINGULAIR) 10 MG tablet Take 1 tablet (10 mg total) by mouth at bedtime. 90 tablet 3  . propranolol (INDERAL) 60  MG tablet Take 1 tablet (60 mg total) by mouth 2 (two) times daily. 180 tablet 3  . trihexyphenidyl (ARTANE) 2 MG tablet Take 1 tablet (2 mg total) by mouth at bedtime. 90 tablet 1  . valACYclovir (VALTREX) 1000 MG tablet Take 2 tabs every 12 hours x 1 day as needed for cold sores 20 tablet 0   No current facility-administered medications on file prior to visit.     Past Medical History:  Diagnosis Date  . Allergic rhinitis, cause unspecified   . Asthma    not bad per pt  . Dermatophytosis of scalp and beard   . Diabetes mellitus without complication ([place])    history of no longer a problem  . Diverticulosis   . Essential and other specified forms of tremor   . HTN (hypertension)   . Irritable bowel syndrome   . Lumbago    bulging disk per MRI   . Mixed hyperlipidemia   . Other acute reactions to stress   . Other diseases of lung, not elsewhere classified    solitary pulm. nodule(left)  . PONV (postoperative nausea and vomiting)   . Thyroid nodule   . Unspecified asthma(493.90)   . Unspecified vitamin D deficiency     Past Surgical History:  Procedure Laterality Date  . ABDOMINAL HYSTERECTOMY    . CHOLECYSTECTOMY N/A 08/23/2016   Procedure: LAPAROSCOPIC CHOLECYSTECTOMY;  Surgeon: Clovis Riley, MD;  Location: [facility];  Service: General;  Laterality: N/A;  . COLONOSCOPY    . ESOPHAGOGASTRODUODENOSCOPY (EGD) WITH PROPOFOL N/A 03/09/2017   Procedure: ESOPHAGOGASTRODUODENOSCOPY (EGD) WITH PROPOFOL;  Surgeon: Jonathon Bellows, MD;  Location: [facility] ENDOSCOPY;  Service: Gastroenterology;  Laterality: N/A;  . LAPAROSCOPIC TOTAL HYSTERECTOMY  2002  . LAPAROSCOPIC UNILATERAL SALPINGO OOPHERECTOMY    . TONSILLECTOMY  1967/68  . TUBAL LIGATION  1986    Social History   Socioeconomic History  . Marital status: Divorced    Spouse name: Not on file  . Number of children: 1  . Years of education: Not on file  . Highest education level: Not on file  Occupational History  . Occupation:  retired    Fish farm manager: OTHER    Comment: [place]: [employer]  . Occupation: part time    Employer: [employer]  . Financial resource strain: Not on file  . Food insecurity:    Worry: Not on file    Inability: Not on file  . Transportation needs:    Medical: Not on file    Non-medical: Not on file  Tobacco Use  . Smoking status: Former Smoker    Types: Cigarettes    Last attempt to quit: 05/17/1980    Years since quitting: 37.6  . Smokeless tobacco: Never Used  Substance and Sexual Activity  . Alcohol use: No  . Drug use: No  . Sexual activity: Never  Lifestyle  . Physical activity:    Days per week: Not on file    Minutes  per session: Not on file  . Stress: Not on file  Relationships  . Social connections:    Talks on phone: Not on file    Gets together: Not on file    Attends religious service: Not on file    Active member of club or organization: Not on file    Attends meetings of clubs or organizations: Not on file    Relationship status: Not on file  . Intimate partner violence:    Fear of current or ex partner: Not on file    Emotionally abused: Not on file    Physically abused: Not on file    Forced sexual activity: Not on file  Other Topics Concern  . 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 Mother  Deceased at age 25       CHF, Essential Tremor  . Father  Deceased       old age, heart disease  . Brother  Alive       diabetes  . Brother  Alive       COPD, heart disease  . Brother  Alive       Benign Essential Tremor  . Sister  Alive       healthy  . Daughter  Alive       anxiety  . Sister  (Not Specified)  . Sister  (Not Specified)  . Sister  (Not Specified)  . Sister  (Not Specified)  . Brother  (Not Specified)  . Brother  (Not  Specified)  . Brother  (Not Specified)  . Brother  (Not Specified)  . Unknown  (Not Specified)  . MGF  (Not Specified)    Review of Systems A complete 10 system ROS was obtained and was negative apart from what is mentioned.   Objective:   VITALS:   Vitals:   12/27/17 1426  BP: 110/66  Pulse: 66  SpO2: 98%  Weight: 156 lb (70.8 kg)  Height: 5\' 5"  (1.651 m)   Wt Readings from Last 3 Encounters:  12/27/17 156 lb (70.8 kg)  11/15/17 159 lb 8 oz (72.3 kg)  10/04/17 155 lb (70.3 kg)    GEN:  The patient appears stated age and is in NAD. HEENT:  Normocephalic, atraumatic.  The mucous membranes are moist. The superficial temporal arteries are without ropiness or tenderness. CV:  RRR Lungs:  CTAB Neck/HEME:  There are no carotid bruits bilaterally.  Neurological examination:  Orientation: The patient is alert and oriented x3. Cranial nerves: There is good facial symmetry. The speech is fluent and clear. Soft palate rises symmetrically and there is no tongue deviation. Hearing is intact to conversational tone. Sensation: Sensation is intact to light touch throughout Motor: Strength is 5/5 in the bilateral upper and lower extremities.   Shoulder shrug is equal and symmetric.  There is no pronator drift.  Movement examination: Tone: There is normal tone in the upper and lower extremities. Abnormal movements: No rest tremor.  Mild postural tremor.  Slightly increased with intention on both sides.  Some trouble with Archimedes spirals on the left. Coordination:  There is no decremation with RAM's, with any form of RAMS, including alternating supination and pronation  of the forearm, hand opening and closing, finger taps, heel taps and toe taps. Gait and Station: The patient walks well down the hall without difficulty.  Labs:  Lab Results  Component Value Date   TSH 0.862 02/08/2014     Chemistry      Component Value Date/Time   NA 139 02/21/2017 0842   K 4.1 02/21/2017 0842     CL 105 02/21/2017 0842   CO2 27 02/21/2017 0842   BUN 7 02/21/2017 0842   CREATININE 0.85 02/21/2017 0842   CREATININE 0.77 10/05/2013 1526      Component Value Date/Time   CALCIUM 9.1 02/21/2017 0842   ALKPHOS 120 03/16/2017 1318   AST 32 03/16/2017 1318   ALT 37 03/16/2017 1318   BILITOT 0.4 03/16/2017 1318        Lab Results  Component Value Date   WBC 7.7 11/15/2017   HGB 12.1 11/15/2017   HCT 36.8 11/15/2017   MCV 87.1 11/15/2017   PLT 212.0 11/15/2017   Lab Results  Component Value Date   TSH 0.862 02/08/2014   Lab Results  Component Value Date   VITAMINB12 1,299 (H) 02/21/2017   Lab Results  Component Value Date   HGBA1C 6.2 07/08/2016      Assessment/Plan:   1.  Essential Tremor.  --She finally feels that she has good control on her essential tremor and really does not want to change medications.  I talked to her about the fact that she has had intermittent bradycardia, which is likely due to her Inderal LA.  She insists that she has had no signs or symptoms of this and really does not want to change it.  She states that it took a long time for her tremor to be well controlled and would like to not make changes.  She will certainly let me know if she has any lightheadedness or near syncopal feelings.  For now, she opted to remain on propranolol, 60 mg twice per day.  She is also on Artane, 2 mg once per day as well as clonazepam 0.5 mg, 1 tablet in the morning and half a tablet at night.    2.  Mild B12 deficiency.  -back on b12 supplement 3.  Depression  -no SI/HI 4.  Non alcoholic fatty liver disease  -due to consumption of fast foods/poor diet .  Liver function studies are markedly improved with improvement of diet and discontinuation of sodas. 5.   Memory change  -Patient asked me about apoE testing.  I do not think she needs this.  I do not think she has Alzheimer's disease clinically.  Talk to her about neurocognitive testing, but she wants to avoid.   If anything, I told her that it would be her medications causing some of the symptoms, especially trihexyphenidyl.  She absolutely does not want to change that medication.  I told her we could continue to monitor and address this in the future.  She was agreeable. 6.  Follow-up 6 months.

## 2017-12-26 NOTE — Telephone Encounter (Signed)
Patient made aware.

## 2017-12-26 NOTE — Telephone Encounter (Signed)
[place] for 30 day

## 2017-12-27 ENCOUNTER — Ambulatory Visit: Payer: Medicare HMO | Admitting: Neurology

## 2017-12-27 ENCOUNTER — Encounter: Payer: Self-pay | Admitting: Neurology

## 2017-12-27 VITALS — BP 110/66 | HR 66 | Ht 65.0 in | Wt 156.0 lb

## 2017-12-27 DIAGNOSIS — G250 Essential tremor: Secondary | ICD-10-CM | POA: Diagnosis not present

## 2017-12-27 DIAGNOSIS — R413 Other amnesia: Secondary | ICD-10-CM

## 2018-01-24 ENCOUNTER — Other Ambulatory Visit: Payer: Self-pay | Admitting: Neurology

## 2018-01-24 ENCOUNTER — Other Ambulatory Visit: Payer: Self-pay

## 2018-01-24 MED ORDER — CLONAZEPAM 0.5 MG PO TABS: ORAL_TABLET | ORAL | 5 refills | Status: DC

## 2018-01-27 DIAGNOSIS — Z0100 Encounter for examination of eyes and vision without abnormal findings: Secondary | ICD-10-CM | POA: Diagnosis not present

## 2018-02-08 ENCOUNTER — Encounter: Payer: Self-pay | Admitting: Podiatry

## 2018-02-08 ENCOUNTER — Ambulatory Visit (INDEPENDENT_AMBULATORY_CARE_PROVIDER_SITE_OTHER): Payer: Medicare HMO

## 2018-02-08 ENCOUNTER — Ambulatory Visit: Payer: Medicare HMO | Admitting: Podiatry

## 2018-02-08 DIAGNOSIS — M779 Enthesopathy, unspecified: Secondary | ICD-10-CM

## 2018-02-08 DIAGNOSIS — G5762 Lesion of plantar nerve, left lower limb: Secondary | ICD-10-CM | POA: Diagnosis not present

## 2018-02-08 DIAGNOSIS — M2011 Hallux valgus (acquired), right foot: Secondary | ICD-10-CM | POA: Diagnosis not present

## 2018-02-08 DIAGNOSIS — M778 Other enthesopathies, not elsewhere classified: Secondary | ICD-10-CM

## 2018-02-08 MED ORDER — METHYLPREDNISOLONE 4 MG PO TBPK: ORAL_TABLET | ORAL | 0 refills | Status: DC

## 2018-02-08 NOTE — Patient Instructions (Signed)
Pre-Operative Instructions  Congratulations, you have decided to take an important step towards improving your quality of life.  You can be assured that the doctors and staff at [facility] will be with you every step of the way.  Here are some important things you should know:  1. Plan to be at the surgery center/hospital at least 1 (one) hour prior to your scheduled time, unless otherwise directed by the surgical center/hospital staff.  You must have a responsible adult accompany you, remain during the surgery and drive you home.  Make sure you have directions to the surgical center/hospital to ensure you arrive on time. 2. If you are having surgery at [facility] or [facility] hospitals, you will need a copy of your medical history and physical form from your family physician within one month prior to the date of surgery. We will give you a form for your primary physician to complete.  3. We make every effort to accommodate the date you request for surgery.  However, there are times where surgery dates or times have to be moved.  We will contact you as soon as possible if a change in schedule is required.   4. No aspirin/ibuprofen for one week before surgery.  If you are on aspirin, any non-steroidal anti-inflammatory medications (Mobic, Aleve, Ibuprofen) should not be taken seven (7) days prior to your surgery.  You make take Tylenol for pain prior to surgery.  5. Medications - If you are taking daily heart and blood pressure medications, seizure, reflux, allergy, asthma, anxiety, pain or diabetes medications, make sure you notify the surgery center/hospital before the day of surgery so they can tell you which medications you should take or avoid the day of surgery. 6. No food or drink after midnight the night before surgery unless directed otherwise by surgical center/hospital staff. 7. No alcoholic beverages 24-hours prior to surgery.  No smoking 24-hours prior or 24-hours after  surgery. 8. Wear loose pants or shorts. They should be loose enough to fit over bandages, boots, and casts. 9. Don't wear slip-on shoes. Sneakers are preferred. 10. Bring your boot with you to the surgery center/hospital.  Also bring crutches or a walker if your physician has prescribed it for you.  If you do not have this equipment, it will be provided for you after surgery. 11. If you have not been contacted by the surgery center/hospital by the day before your surgery, call to confirm the date and time of your surgery. 12. Leave-time from work may vary depending on the type of surgery you have.  Appropriate arrangements should be made prior to surgery with your employer. 13. Prescriptions will be provided immediately following surgery by your doctor.  Fill these as soon as possible after surgery and take the medication as directed. Pain medications will not be refilled on weekends and must be approved by the doctor. 14. Remove nail polish on the operative foot and avoid getting pedicures prior to surgery. 15. Wash the night before surgery.  The night before surgery wash the foot and leg well with water and the antibacterial soap provided. Be sure to pay special attention to beneath the toenails and in between the toes.  Wash for at least three (3) minutes. Rinse thoroughly with water and dry well with a towel.  Perform this wash unless told not to do so by your physician.  Enclosed: 1 Ice pack (please put in freezer the night before surgery)   1 Hibiclens skin cleaner     Pre-op instructions  If you have any questions regarding the instructions, please do not hesitate to call our office.  [place]: [address], [place], [place] [address] -- [phone]  [place]: [address] [place], [place] [address] -- [phone]  [place]: [address]  [place], [place] [address] -- [phone]  [place]: [address], [address], [place], [place] [address] -- [phone]  Website:  [email or web address] 

## 2018-02-08 NOTE — Progress Notes (Signed)
She presents today with a chief complaint of soreness to the dorsal aspect of the left foot she states that it is tender swollen just like it was when I had a stress fracture.  She says it feels like it been doing this now for the past several months.  She also like to consider surgical intervention regarding her bunions.  Objective: Vital signs are stable alert and oriented x3.  She has tenderness on palpation to the interdigital spaces bilaterally.  She has severe bunion deformities right greater than left radiographs confirm this.  Pulses are strong and palpable.  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  Pain in limb secondary to hallux valgus.  Plan: Sterile Betadine skin prep I injected 20 mg Kenalog 5 mg Marcaine third interspace of the left foot.  Also consented her today for an [facility] bunion repair with screw right foot.  Answered all questions regarding this procedure the best my billing layman's terms she understands is amenable to it did discuss the possible postop complications which may include but not limited to postop pain bleeding swelling infection recurrence need for further surgery overcorrection under correction loss of digit loss of limb loss of life.  We dispensed information regarding her preop instructions as well as the surgery center and information regarding anesthesia.  Also dispensed a Cam walker I will follow-up with her in the near future for surgical intervention.

## 2018-03-01 ENCOUNTER — Other Ambulatory Visit (INDEPENDENT_AMBULATORY_CARE_PROVIDER_SITE_OTHER): Payer: Medicare HMO

## 2018-03-01 ENCOUNTER — Telehealth (INDEPENDENT_AMBULATORY_CARE_PROVIDER_SITE_OTHER): Payer: Medicare HMO | Admitting: Family Medicine

## 2018-03-01 ENCOUNTER — Ambulatory Visit: Payer: PPO

## 2018-03-01 DIAGNOSIS — Z0000 Encounter for general adult medical examination without abnormal findings: Secondary | ICD-10-CM | POA: Diagnosis not present

## 2018-03-01 DIAGNOSIS — E042 Nontoxic multinodular goiter: Secondary | ICD-10-CM

## 2018-03-01 DIAGNOSIS — E782 Mixed hyperlipidemia: Secondary | ICD-10-CM | POA: Diagnosis not present

## 2018-03-01 DIAGNOSIS — E538 Deficiency of other specified B group vitamins: Secondary | ICD-10-CM

## 2018-03-01 DIAGNOSIS — E559 Vitamin D deficiency, unspecified: Secondary | ICD-10-CM

## 2018-03-01 LAB — COMPREHENSIVE METABOLIC PANEL
ALBUMIN: 3.8 g/dL (ref 3.5–5.2)
ALT: 24 U/L (ref 0–35)
AST: 15 U/L (ref 0–37)
Alkaline Phosphatase: 110 U/L (ref 39–117)
BUN: 14 mg/dL (ref 6–23)
CHLORIDE: 105 meq/L (ref 96–112)
CO2: 29 mEq/L (ref 19–32)
Calcium: 9.4 mg/dL (ref 8.4–10.5)
Creatinine, Ser: 0.86 mg/dL (ref 0.40–1.20)
GFR: 69.29 mL/min (ref >60.00)
Glucose, Bld: 117 mg/dL — ABNORMAL HIGH (ref 70–99)
POTASSIUM: 4.6 meq/L (ref 3.5–5.1)
SODIUM: 140 meq/L (ref 135–145)
Total Bilirubin: 0.3 mg/dL (ref 0.2–1.2)
Total Protein: 6.8 g/dL (ref 6.0–8.3)

## 2018-03-01 LAB — LIPID PANEL
CHOLESTEROL: 256 mg/dL — AB (ref 0–200)
HDL: 36 mg/dL — ABNORMAL LOW (ref >39.00)
LDL CALC: 185 mg/dL — AB (ref 0–99)
NonHDL: 219.86
Total CHOL/HDL Ratio: 7
Triglycerides: 174 mg/dL — ABNORMAL HIGH (ref 0.0–149.0)
VLDL: 34.8 mg/dL (ref 0.0–40.0)

## 2018-03-01 LAB — T3, FREE: T3, Free: 2.7 pg/mL (ref 2.3–4.2)

## 2018-03-01 LAB — VITAMIN B12: VITAMIN B 12: 183 pg/mL — AB (ref 211–911)

## 2018-03-01 LAB — VITAMIN D 25 HYDROXY (VIT D DEFICIENCY, FRACTURES): VITD: 18 ng/mL — ABNORMAL LOW (ref 30.00–100.00)

## 2018-03-01 LAB — TSH: TSH: 1.02 u[IU]/mL (ref 0.35–4.50)

## 2018-03-01 LAB — T4, FREE: FREE T4: 0.9 ng/dL (ref 0.60–1.60)

## 2018-03-01 NOTE — Telephone Encounter (Signed)
-----   Message from Eustace Pen, LPN sent at 35/67/0141  2:37 PM EDT ----- Regarding: Labs 10/16 Lab orders needed. Thank you.  Insurance:  [employer]

## 2018-03-07 ENCOUNTER — Ambulatory Visit (INDEPENDENT_AMBULATORY_CARE_PROVIDER_SITE_OTHER): Payer: Medicare HMO | Admitting: Family Medicine

## 2018-03-07 ENCOUNTER — Encounter: Payer: Self-pay | Admitting: Family Medicine

## 2018-03-07 VITALS — BP 120/70 | HR 66 | Temp 97.7°F | Ht 65.25 in | Wt 156.2 lb

## 2018-03-07 DIAGNOSIS — E782 Mixed hyperlipidemia: Secondary | ICD-10-CM | POA: Diagnosis not present

## 2018-03-07 DIAGNOSIS — E538 Deficiency of other specified B group vitamins: Secondary | ICD-10-CM

## 2018-03-07 DIAGNOSIS — E559 Vitamin D deficiency, unspecified: Secondary | ICD-10-CM | POA: Diagnosis not present

## 2018-03-07 DIAGNOSIS — Z0000 Encounter for general adult medical examination without abnormal findings: Secondary | ICD-10-CM | POA: Diagnosis not present

## 2018-03-07 DIAGNOSIS — G250 Essential tremor: Secondary | ICD-10-CM

## 2018-03-07 DIAGNOSIS — I10 Essential (primary) hypertension: Secondary | ICD-10-CM

## 2018-03-07 DIAGNOSIS — R0789 Other chest pain: Secondary | ICD-10-CM

## 2018-03-07 DIAGNOSIS — R7303 Prediabetes: Secondary | ICD-10-CM | POA: Diagnosis not present

## 2018-03-07 LAB — POCT GLYCOSYLATED HEMOGLOBIN (HGB A1C): HEMOGLOBIN A1C: 5.9 % — AB (ref 4.0–5.6)

## 2018-03-07 MED ORDER — VITAMIN D3 1.25 MG (50000 UT) PO CAPS: 1.0000 | ORAL_CAPSULE | ORAL | 0 refills | Status: DC

## 2018-03-07 NOTE — Assessment & Plan Note (Addendum)
MOst likely MSK strain. Follow up for further eval if not improving.

## 2018-03-07 NOTE — Assessment & Plan Note (Signed)
Replete

## 2018-03-07 NOTE — Assessment & Plan Note (Signed)
Refuses statins given SE in past. Not interested in zetia.  Gradually improving with diet changes.

## 2018-03-07 NOTE — Progress Notes (Signed)
Subjective:    Patient ID: Michelle Donovan, female    DOB: 01-20-1946, 72 y.o.   MRN: [id number]  HPI  The patient presents for annual medicare wellness, complete physical and review of chronic health problems. He/She also has the following acute concerns today:  RUQ pain in last few weeks.. intermittent dull ache, radiates to right mid back, lasts minutes,  No change with eating, movement. No new diarrhea, does have intermittant constipation, no blood in stool.  S/p gallbladder removal.  nml liver function on recent labs.  I have personally reviewed the Medicare Annual Wellness questionnaire and have noted 1. The patient's medical and social history 2. Their use of alcohol, tobacco or illicit drugs 3. Their current medications and supplements 4. The patient's functional ability including ADL's, fall risks, home safety risks and hearing or visual             impairment. 5. Diet and physical activities 6. Evidence for depression or mood disorders 7.         Updated provider list Cognitive evaluation was performed and recorded on pt medicare questionnaire form. The patients weight, height, BMI and visual acuity have been recorded in the chart  I have made referrals, counseling and provided education to the patient based review of the above and I have provided the pt with a written personalized care plan for preventive services.   Documentation of this information was scanned into the electronic record under the media tab.   Benign essential tremor.. Much improved on clonazepam, propranolol , and artane  Followed by Neuro.  Elevated Cholesterol: LDL improved but far from goal.   SE in past with statins, tried zetia.. Pt does pt recall results. Lab Results  Component Value Date   CHOL 256 (H) 03/01/2018   HDL 36.00 (L) 03/01/2018   LDLCALC 185 (H) 03/01/2018   LDLDIRECT 208.0 02/21/2017   TRIG 174.0 (H) 03/01/2018   CHOLHDL 7 03/01/2018  Using medications without  problems: Muscle aches:  Diet compliance: moderate, decreasing fast foods. Exercise: minimal Other complaints:  Prediabetes  Consider re-check given glucose 117 on labs.  Recent shot of cortisone in foot in last few weeks. Also has had several rounds of prednisone in last few months. Lab Results  Component Value Date   HGBA1C 6.2 07/08/2016    Hypertension:    Good control on propranolol. BP Readings from Last 3 Encounters:  03/07/18 120/70  12/27/17 110/66  11/15/17 120/60  Using medication without problems or lightheadedness: none Chest pain with exertion:none Edema:none Short of breath:none Average home BPs: good Other issues:  Hx of fatty liver   Vit D: has gotten low off supplement.   Low B12 suppelment     Hearing Screening   Method: Audiometry   125Hz  250Hz  500Hz  1000Hz  2000Hz  3000Hz  4000Hz  6000Hz  8000Hz   Right ear:   20 20 20  20     Left ear:   20 20 20  20     Vision Screening Comments: Eye Exam with Dr. Valma Cava at [facility] in [facility] 01/2018   Fall Risk  03/07/2018 12/27/2017 05/24/2017 02/23/2017 11/18/2016  Falls in the past year? No No No No No   Depression screen [facility] 2/9 03/07/2018 02/23/2017 11/12/2016 01/23/2016 10/11/2014  Decreased Interest 0 1 0 0 0  Down, Depressed, Hopeless 0 1 0 0 0  PHQ - 2 Score 0 2 0 0 0  Altered sleeping - 0 - - -  Tired, decreased energy - 2 - - -  Change in appetite - 0 - - -  Feeling bad or failure about yourself  - 0 - - -  Trouble concentrating - 0 - - -  Moving slowly or fidgety/restless - 0 - - -  Suicidal thoughts - 0 - - -  PHQ-9 Score - 4 - - -  Difficult doing work/chores - Somewhat difficult - - -    Advance directives and end of life planning reviewed in detail with patient and documented in EMR. Patient given handout on advance care directives if needed. HCPOA and living will updated if needed.   Social History /Family History/Past Medical History reviewed in detail and updated in EMR if needed. Blood  pressure 120/70, pulse 66, temperature 97.7 F (36.5 C), temperature source Oral, height 5' 5.25" (1.657 m), weight 156 lb 4 oz (70.9 kg).  Review of Systems  Constitutional: Negative for fatigue and fever.  HENT: Negative for congestion.   Eyes: Negative for pain.  Respiratory: Negative for cough and shortness of breath.   Cardiovascular: Negative for chest pain, palpitations and leg swelling.  Gastrointestinal: Positive for abdominal pain and constipation.  Genitourinary: Negative for dysuria and vaginal bleeding.  Musculoskeletal: Negative for back pain.  Neurological: Negative for syncope, light-headedness and headaches.  Psychiatric/Behavioral: Negative for dysphoric mood.       Objective:   Physical Exam  Constitutional: Vital signs are normal. She appears well-developed and well-nourished. She is cooperative.  Non-toxic appearance. She does not appear ill. No distress.  HENT:  Head: Normocephalic.  Right Ear: Hearing, tympanic membrane, external ear and ear canal normal.  Left Ear: Hearing, tympanic membrane, external ear and ear canal normal.  Nose: Nose normal.  Eyes: Pupils are equal, round, and reactive to light. Conjunctivae, EOM and lids are normal. Lids are everted and swept, no foreign bodies found.  Neck: Trachea normal and normal range of motion. Neck supple. Carotid bruit is not present. No thyroid mass and no thyromegaly present.  Cardiovascular: Normal rate, regular rhythm, S1 normal, S2 normal, normal heart sounds and intact distal pulses. Exam reveals no gallop.  No murmur heard. Pulmonary/Chest: Effort normal and breath sounds normal. No respiratory distress. She has no wheezes. She has no rhonchi. She has no rales.  Abdominal: Soft. Normal appearance and bowel sounds are normal. She exhibits no distension, no fluid wave, no abdominal bruit and no mass. There is no hepatosplenomegaly. There is no tenderness. There is no rebound, no guarding and no CVA tenderness.  No hernia.  Lymphadenopathy:    She has no cervical adenopathy.    She has no axillary adenopathy.  Neurological: She is alert. She has normal strength. No cranial nerve deficit or sensory deficit.  Skin: Skin is warm, dry and intact. No rash noted.  Psychiatric: Her speech is normal and behavior is normal. Judgment normal. Her mood appears not anxious. Cognition and memory are normal. She does not exhibit a depressed mood.          Assessment & Plan:  The patient's preventative maintenance and recommended screening tests for an annual wellness exam were reviewed in full today. Brought up to date unless services declined.  Counselled on the importance of diet, exercise, and its role in overall health and mortality. The patient's FH and SH was reviewed, including their home life, tobacco status, and drug and alcohol status.   Refused flu. No pap/DVE given total Hysterectomy.  Former smoker, remotely. <25 pack year history Colon: last 2006, tics, repeat in 10 years, cologuard  2017.. repeat in 2020 Bone density: nml 2016.Marland Kitchen Repeat 2021 Mammogram: nml 2016.Marland Kitchen Check every other year. Due now.

## 2018-03-07 NOTE — Patient Instructions (Addendum)
Get back to exercise as able.  Restart  B12 supplement 1000 mcg daily to every other day  Complete 12 week course of Vit D3 , follow with daily 400 IU by mouth of vit D3.  Call to schedule mammogram on your own.   Call if  Right chest wall pain not improving.

## 2018-03-17 ENCOUNTER — Encounter: Payer: Self-pay | Admitting: Family Medicine

## 2018-03-17 DIAGNOSIS — Z1231 Encounter for screening mammogram for malignant neoplasm of breast: Secondary | ICD-10-CM | POA: Diagnosis not present

## 2018-04-18 ENCOUNTER — Other Ambulatory Visit: Payer: Self-pay | Admitting: Family Medicine

## 2018-05-13 ENCOUNTER — Encounter: Payer: Self-pay | Admitting: Family Medicine

## 2018-05-13 ENCOUNTER — Emergency Department (HOSPITAL_COMMUNITY)
Admission: EM | Admit: 2018-05-13 | Discharge: 2018-05-13 | Discharge status: Against Medical Advice | Payer: Medicare HMO | Attending: Emergency Medicine | Admitting: Emergency Medicine

## 2018-05-13 ENCOUNTER — Ambulatory Visit (INDEPENDENT_AMBULATORY_CARE_PROVIDER_SITE_OTHER): Payer: Medicare HMO | Admitting: Family Medicine

## 2018-05-13 ENCOUNTER — Encounter (HOSPITAL_COMMUNITY): Payer: Self-pay

## 2018-05-13 ENCOUNTER — Other Ambulatory Visit: Payer: Self-pay

## 2018-05-13 DIAGNOSIS — R1030 Lower abdominal pain, unspecified: Secondary | ICD-10-CM | POA: Diagnosis present

## 2018-05-13 DIAGNOSIS — Z5321 Procedure and treatment not carried out due to patient leaving prior to being seen by health care provider: Secondary | ICD-10-CM | POA: Insufficient documentation

## 2018-05-13 HISTORY — DX: Fatty (change of) liver, not elsewhere classified: K76.0

## 2018-05-13 LAB — CBC
HCT: 44 % (ref 36.0–46.0)
Hemoglobin: 13.4 g/dL (ref 12.0–15.0)
MCH: 28.4 pg (ref 26.0–34.0)
MCHC: 30.5 g/dL (ref 30.0–36.0)
MCV: 93.2 fL (ref 80.0–100.0)
Platelets: 242 10*3/uL (ref 150–400)
RBC: 4.72 MIL/uL (ref 3.87–5.11)
RDW: 12.7 % (ref 11.5–15.5)
WBC: 9.6 10*3/uL (ref 4.0–10.5)
nRBC: 0 % (ref 0.0–0.2)

## 2018-05-13 LAB — COMPREHENSIVE METABOLIC PANEL
ALBUMIN: 3.9 g/dL (ref 3.5–5.0)
ALT: 34 U/L (ref 0–44)
AST: 25 U/L (ref 15–41)
Alkaline Phosphatase: 112 U/L (ref 38–126)
Anion gap: 9 (ref 5–15)
BUN: 11 mg/dL (ref 8–23)
CHLORIDE: 105 mmol/L (ref 98–111)
CO2: 26 mmol/L (ref 22–32)
Calcium: 9.1 mg/dL (ref 8.9–10.3)
Creatinine, Ser: 0.87 mg/dL (ref 0.44–1.00)
GFR calc Af Amer: >60 mL/min (ref >60)
GFR calc non Af Amer: >60 mL/min (ref >60)
Glucose, Bld: 123 mg/dL — ABNORMAL HIGH (ref 70–99)
Potassium: 3.9 mmol/L (ref 3.5–5.1)
SODIUM: 140 mmol/L (ref 135–145)
Total Bilirubin: 0.5 mg/dL (ref 0.3–1.2)
Total Protein: 7.4 g/dL (ref 6.5–8.1)

## 2018-05-13 LAB — LIPASE, BLOOD: Lipase: 37 U/L (ref 11–51)

## 2018-05-13 NOTE — Progress Notes (Signed)
   Subjective:   Patient ID: Michelle Donovan, female    DOB: [birth year], 72 y.o.   MRN: [id number]  Jermiyah Ricotta is a pleasant 72 y.o. year old female who presents to clinic today with Pelvic Pain (complains of suprapubic pain, worst when walking. This started at about 7 pm yesterday. No urinary symptoms. No changes in bowel habits. )  on 05/13/2018  HPI:   Pt is extremely tender on exam throughout her entire lower abdomen.  Advised her to go straight to ER as she needs a full work up- including imaging which we cannot do here- + rebound, guarding and fever. ?perforation.  Pt advised to go to [facility] ED and she will call her daughter.  The patient indicates understanding of these issues and agrees with the plan. Appt canceled.  Review of Systems     Objective:    There were no vitals taken for this visit.   Physical Exam        Assessment & Plan:   No diagnosis found. No follow-ups on file.

## 2018-05-13 NOTE — ED Triage Notes (Signed)
Patient c/o bilateral lower abdominal pain since yesterday. Patient denies any N/V/D. Patient went to her PCP today and was told t come to the ED due to tenderness when palpated.

## 2018-05-15 ENCOUNTER — Encounter: Payer: Self-pay | Admitting: Emergency Medicine

## 2018-05-15 ENCOUNTER — Ambulatory Visit: Payer: Medicare HMO | Admitting: Family Medicine

## 2018-05-15 ENCOUNTER — Emergency Department: Payer: Medicare HMO

## 2018-05-15 ENCOUNTER — Other Ambulatory Visit: Payer: Self-pay

## 2018-05-15 ENCOUNTER — Emergency Department
Admission: EM | Admit: 2018-05-15 | Discharge: 2018-05-15 | Disposition: A | Discharge status: Home / Self Care | Payer: Medicare HMO | Attending: Emergency Medicine | Admitting: Emergency Medicine

## 2018-05-15 ENCOUNTER — Telehealth: Payer: Self-pay

## 2018-05-15 DIAGNOSIS — K5792 Diverticulitis of intestine, part unspecified, without perforation or abscess without bleeding: Secondary | ICD-10-CM

## 2018-05-15 DIAGNOSIS — I10 Essential (primary) hypertension: Secondary | ICD-10-CM | POA: Insufficient documentation

## 2018-05-15 DIAGNOSIS — R1030 Lower abdominal pain, unspecified: Secondary | ICD-10-CM | POA: Diagnosis present

## 2018-05-15 DIAGNOSIS — Z79899 Other long term (current) drug therapy: Secondary | ICD-10-CM | POA: Insufficient documentation

## 2018-05-15 DIAGNOSIS — Z87891 Personal history of nicotine dependence: Secondary | ICD-10-CM | POA: Insufficient documentation

## 2018-05-15 DIAGNOSIS — E119 Type 2 diabetes mellitus without complications: Secondary | ICD-10-CM | POA: Diagnosis not present

## 2018-05-15 DIAGNOSIS — K5730 Diverticulosis of large intestine without perforation or abscess without bleeding: Secondary | ICD-10-CM | POA: Diagnosis not present

## 2018-05-15 LAB — URINALYSIS, COMPLETE (UACMP) WITH MICROSCOPIC
Bacteria, UA: NONE SEEN
Bilirubin Urine: NEGATIVE
Glucose, UA: NEGATIVE mg/dL
Hgb urine dipstick: NEGATIVE
Ketones, ur: NEGATIVE mg/dL
Nitrite: NEGATIVE
PH: 5 (ref 5.0–8.0)
Protein, ur: NEGATIVE mg/dL
Specific Gravity, Urine: 1.018 (ref 1.005–1.030)

## 2018-05-15 MED ORDER — CIPROFLOXACIN HCL 500 MG PO TABS: 500.0000 mg | ORAL_TABLET | Freq: Two times a day (BID) | ORAL | 0 refills | Status: DC

## 2018-05-15 MED ORDER — METRONIDAZOLE 500 MG PO TABS: 500.0000 mg | ORAL_TABLET | Freq: Two times a day (BID) | ORAL | 0 refills | Status: DC

## 2018-05-15 NOTE — ED Notes (Signed)
Patient returned from CT

## 2018-05-15 NOTE — ED Triage Notes (Signed)
Pain worsens with movement.

## 2018-05-15 NOTE — ED Notes (Signed)
Denies vomiting, 3 loose stools yesterday, none today.

## 2018-05-15 NOTE — ED Triage Notes (Signed)
Pt here with c/o lower abd pain that began a few days ago, denies blood in urine or burning with urination, denies flank pain, appears in NAD, VSS.

## 2018-05-15 NOTE — ED Notes (Signed)
Patient to CT.

## 2018-05-15 NOTE — ED Notes (Signed)
Pt does have IBS. Was triaged at [facility] long on Sat, but left after 3 hours due to long wait time.

## 2018-05-15 NOTE — Telephone Encounter (Signed)
[facility] faxed note from 05/13/18; lower abd pain; per chart review pt went to [facility] ED but per ED note pt left without being seen after triage and waiting for 3 hrs; I spoke with pt and she is still having a dull on and off lower abd pain; pt is also itching all over; no rash.pt said does not have pain when sitting or laying only when she is up and moving around. Pain level now is 2. No UTI symptoms, no constipation and no fever. Pt had 2 loose BMs on 05/14/18. No loose stools today. Pt scheduled appt to see Dr Einar Pheasant 05/15/18 at 2:20. FYI to Dr Einar Pheasant.

## 2018-05-15 NOTE — ED Provider Notes (Signed)
[facility] Emergency Department Provider Note   ____________________________________________    I have reviewed the triage vital signs and the nursing notes.   HISTORY  Chief Complaint Abdominal Pain     HPI Michelle Donovan is a 72 y.o. female who presents with lower abdominal abdominal pain.  Patient reports symptoms started 3 days ago, reports the pain has improved somewhat, she describes lower abdominal pain which at times was quite sharp especially with movement and palpation right greater than left currently hurts primarily in the center.  Normal stools, nonbloody.  No vomiting.  No fevers or chills.  Possible history of diverticulitis.  Has not take anything for this   Past Medical History:  Diagnosis Date  . Allergic rhinitis, cause unspecified   . Asthma    not bad per pt  . Dermatophytosis of scalp and beard   . Diabetes mellitus without complication ([place])    history of no longer a problem  . Diverticulosis   . Essential and other specified forms of tremor   . Fatty liver   . HTN (hypertension)   . Irritable bowel syndrome   . Lumbago    bulging disk per MRI   . Mixed hyperlipidemia   . Other acute reactions to stress   . Other diseases of lung, not elsewhere classified    solitary pulm. nodule(left)  . PONV (postoperative nausea and vomiting)   . Thyroid nodule   . Unspecified asthma(493.90)   . Unspecified vitamin D deficiency     Patient Active Problem List   Diagnosis Date Noted  . Right-sided chest wall pain 03/07/2018  . Epistaxis 11/16/2017  . ETD (Eustachian tube dysfunction), bilateral 10/04/2017  . Dysphagia 02/01/2017  . Pain in or around eye, left 08/02/2016  . Insomnia 11/05/2014  . 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 Elevated LFTs 10/05/2013  . Multiple thyroid nodules 08/29/2013  . Bilateral arm pain 08/08/2013  . Bilateral neck pain 08/08/2013  . Essential tremor 07/19/2013  .  Constipation, chronic 04/17/2013  . Ulnar nerve compression 12/07/2012  . Varicosities of leg 07/07/2011  . B12 deficiency 10/26/2010  . Benign essential hypertension 07/17/2009  . ANXIETY, SITUATIONAL 03/06/2009  . Vitamin D deficiency 09/19/2008  . 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 HYPERLIPIDEMIA 02/06/2007  . Allergic rhinitis 02/06/2007  . Asthma, mild persistent 02/06/2007  . IRRITABLE BOWEL SYNDROME 02/06/2007  . LOW BACK PAIN, CHRONIC 02/06/2007    Past Surgical History:  Procedure Laterality Date  . ABDOMINAL HYSTERECTOMY    . CHOLECYSTECTOMY N/A 08/23/2016   Procedure: LAPAROSCOPIC CHOLECYSTECTOMY;  Surgeon: Clovis Riley, MD;  Location: [place];  Service: General;  Laterality: N/A;  . COLONOSCOPY    . ESOPHAGOGASTRODUODENOSCOPY (EGD) WITH PROPOFOL N/A 03/09/2017   Procedure: ESOPHAGOGASTRODUODENOSCOPY (EGD) WITH PROPOFOL;  Surgeon: Jonathon Bellows, MD;  Location[facility]COPY;  Service: Gastroenterology;  Laterality: N/A;  . LAPAROSCOPIC TOTAL HYSTERECTOMY  2002  . LAPAROSCOPIC UNILATERAL SALPINGO OOPHERECTOMY    . TONSILLECTOMY  1967/68  . TUBAL LIGATION  1986    Prior to Admission medications   Medication Sig Start Date End Date Taking? Authorizing Provider  cetirizine (ZYRTEC) 10 MG tablet Take 10 mg by mouth daily.    [provider]  Cholecalciferol (VITAMIN D3) 50000 units CAPS Take 1 capsule by mouth once a week. 03/07/18   Bedsole, Amy E, MD  ciprofloxacin (CIPRO) 500 MG tablet Take 1 tablet (500 mg total) by mouth 2 (two) times daily. 05/15/18  Lavonia Drafts, MD  clonazePAM (KLONOPIN) 0.5 MG tablet TAKE 1 TAB BY MOUTH EVERY MORNING AND 1/2 TAB NIGHTLY AT BEDTIME 01/24/18   Tat, Eustace Quail, DO  metroNIDAZOLE (FLAGYL) 500 MG tablet Take 1 tablet (500 mg total) by mouth 2 (two) times daily after a meal. 05/15/18   Lavonia Drafts, MD  montelukast (SINGULAIR) 10 MG tablet TAKE 1 TABLET BY MOUTH EVERYDAY AT BEDTIME 04/18/18   Bedsole, Amy E, MD    propranolol (INDERAL) 60 MG tablet Take 1 tablet (60 mg total) by mouth 2 (two) times daily. 06/28/17   Bedsole, Amy E, MD  trihexyphenidyl (ARTANE) 2 MG tablet Take 1 tablet (2 mg total) by mouth at bedtime. 05/24/17   Tat, Eustace Quail, DO  valACYclovir (VALTREX) 1000 MG tablet Take 2 tabs every 12 hours x 1 day as needed for cold sores 05/25/16   Jearld Fenton, NP     Allergies Tylenol [acetaminophen]; Codeine; and Topamax [topiramate]  Family History  Problem Relation Age of Onset  . Stroke Mother   . Tremor Mother   . Diabetes Mother   . Heart failure Mother   . Other Father        Trigeminal neuralgia  . Diabetes Sister   . Coronary artery disease Sister   . Irritable bowel syndrome Sister   . Tremor Sister   . Diabetes Brother        x 3  . Coronary artery disease Brother        x 3  . Irritable bowel syndrome Brother        x 3  . Tremor Brother        x 3  . Breast cancer Other        Aunts and cousin  . Colon cancer Maternal Grandfather     Social History Social History   Tobacco Use  . Smoking status: Former Smoker    Types: Cigarettes    Last attempt to quit: 05/17/1980    Years since quitting: 38.0  . Smokeless tobacco: Never Used  Substance Use Topics  . Alcohol use: No  . Drug use: No    Review of Systems  Constitutional: No fever/chills Eyes: No visual changes.  ENT: No sore throat. Cardiovascular: Denies chest pain. Respiratory: Denies shortness of breath. Gastrointestinal: As above Genitourinary: Negative for dysuria.  No difficulty urinating Musculoskeletal: Negative for back pain. Skin: Negative for rash. Neurological: Negative for headaches   ____________________________________________   PHYSICAL EXAM:  VITAL SIGNS: ED Triage Vitals  Enc Vitals Group     BP 05/15/18 1121 (!) 131/48     Pulse Rate 05/15/18 1121 (!) 56     Resp 05/15/18 1121 18     Temp 05/15/18 1121 98 F (36.7 C)     Temp Source 05/15/18 1121 Oral     SpO2  05/15/18 1121 99 %     Weight 05/15/18 1122 70.8 kg (156 lb)     Height 05/15/18 1122 1.651 m (5\' 5" )     Head Circumference --      Peak Flow --      Pain Score 05/15/18 1121 0     Pain Loc --      Pain Edu? --      Excl. in [place]? --     Constitutional: Alert and oriented.  Eyes: Conjunctivae are normal.    Cardiovascular: Normal rate, regular rhythm. Grossly normal heart sounds.  Good peripheral circulation. Respiratory: Normal respiratory effort.  No retractions.  Lungs CTAB. Gastrointestinal: Mild tenderness palpation right greater than left lower abdomen. No distention.  No CVA tenderness.  Overall reassuring exam Genitourinary: deferred Musculoskeletal:.  Warm and well perfused Neurologic:  Normal speech and language. No gross focal neurologic deficits are appreciated.  Skin:  Skin is warm, dry and intact. No rash noted. Psychiatric: Mood and affect are normal. Speech and behavior are normal.  ____________________________________________   LABS (all labs ordered are listed, but only abnormal results are displayed)  Labs Reviewed  URINALYSIS, COMPLETE (UACMP) WITH MICROSCOPIC - Abnormal; Notable for the following components:      Result Value   Color, Urine YELLOW (*)    APPearance CLEAR (*)    Leukocytes, UA SMALL (*)    All other components within normal limits  URINE CULTURE  CBC  COMPREHENSIVE METABOLIC PANEL  LIPASE, BLOOD   ____________________________________________  EKG   ____________________________________________  RADIOLOGY  CT demonstrates diverticulosis, no obvious diverticulitis ____________________________________________   PROCEDURES  Procedure(s) performed: No  Procedures   Critical Care performed: No ____________________________________________   INITIAL IMPRESSION / ASSESSMENT AND PLAN / ED COURSE  Pertinent labs & imaging results that were available during my care of the patient were reviewed by me and considered in my medical  decision making (see chart for details).  Differential includes diverticulitis, urinary tract infection appendicitis.  Suspect diverticulitis based on exam, likely improving.  Lab work performed 2 days ago was quite reassuring.  CT scan demonstrates diverticulosis but no active diverticulitis, urinalysis does demonstrate mild leukocytes.  I do suspect mild diverticulitis as the cause of her pain will treat with Cipro Flagyl which will also cover urinary tract infection, send urine culture.  Does not want any analgesics    ____________________________________________   FINAL CLINICAL IMPRESSION(S) / ED DIAGNOSES  Final diagnoses:  Diverticulitis        Note:  This document was prepared using Dragon voice recognition software and may include unintentional dictation errors.    Lavonia Drafts, MD 05/15/18 [phone]

## 2018-05-15 NOTE — Telephone Encounter (Signed)
Dr Einar Pheasant saw where pt was seen by Dr Deborra Medina on 05/13/18 and advised to go to ED. I spoke with pt again and advised since she was seen by provider on 05/13/18 and advised to go to ED pt should go to ED now for eval. Pt voiced understanding and will go to [facility] ED now. FYI to Dr Einar Pheasant and Dr Diona Browner.

## 2018-05-16 ENCOUNTER — Ambulatory Visit: Payer: Medicare HMO | Admitting: Family Medicine

## 2018-05-16 LAB — URINE CULTURE: Culture: <10000 — AB

## 2018-05-24 ENCOUNTER — Other Ambulatory Visit: Payer: Self-pay | Admitting: Family Medicine

## 2018-05-24 DIAGNOSIS — R69 Illness, unspecified: Secondary | ICD-10-CM | POA: Diagnosis not present

## 2018-06-18 ENCOUNTER — Other Ambulatory Visit: Payer: Self-pay | Admitting: Family Medicine

## 2018-06-20 ENCOUNTER — Other Ambulatory Visit: Payer: Self-pay | Admitting: Family Medicine

## 2018-06-21 NOTE — Progress Notes (Deleted)
Subjective:    Michelle Donovan was seen in consultation in the movement disorder clinic at the request of Jinny Sanders, MD.  The evaluation is for tremor.  Pt has previously seen Dr. Leta Baptist and notes from 2012 were reviewed.  Pt reports that this was just a one time consultation in 2012.   The patient is a 73 y.o. right handed female with a history of tremor.   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  She states that it is in both hands but R is worse than the L (but she uses the right more). There is a family hx of tremor in her mother and brother.  She may use xanax very if going out (less than one time per month) to suppress tremor.    Affected by caffeine:  yes(drinks 8 oz coffee per da) Affected by alcohol:  unknown Affected by stress:  yes Affected by fatigue:  no Spills soup if on spoon:  yes (uses weighted utensils at home for this) Spills glass of liquid if full:  yes Affects ADL's (tying shoes, brushing teeth, etc):  no except trouble with eyeliner  11/13/13 update:  The patient returns today for follow up.  She was restarted on topamax last visit.  She was doing markedly better when it was first started but she has recently had some thyroid issues (multinodular goiter), pink eye, bilateral frozen shoulders and URI and tremor got worse during this.  She has been worked up for elevated liver enzymes but then repeat enzymes were normal and hepatitis panel was normal.  She had some paresthesias with the topamax on initiation.  She has lost some weight; no longer able to drink her coke as it tastes poorly with the topamax.    02/14/14 update:  Pt returns today for f/u.  Noted hair loss and called here and I d/c topamax 2 days ago.   Originally thought that hair loss due to highlights had put in hair over the summer but then realized that hair loss continued.   Pt frustrated as knew that topamax worked for tremor.   Has noted chin tremor.  Restarted back to work.    05/20/14 update:  Pt has a hx of ET.  Tried to d/c the topamax because of hair loss and start neurontin but tremor was not well controlled on neurontin alone and she really wanted wanted to go back on the topamax, knowing the risks.  She states that it isn't really falling out like it was but it isn't coming back like she hoped either.  She states that tremor isn't well controlled either.  She states that she wants to stay on the gabapentin because it is helping the frozen shoulder.  She is feeling depressed because of the tremor.  Asks if her thyroid nodules could be causing the tremor.  Her mother had a goiter and also had tremor.   02/18/15 update:  The patient presents today for follow-up.  I have not seen her since January, 2016.  She ended up wanting to increase the dose of Topamax, which she did to a total of 200 mg per day.  She sent me an email recently stating that tremor was less well controlled and she was having more hair loss.  She wanted to consider other options.  She admits that stress has increased greatly; her daughter and grandchild have moved back into her home.  She  reports that her daughter makes things very stressful for her.   She is noting tremor with eating, drinking, putting on makeup, writing.   She was on gabapentin for her shoulder pain but she has d/c it now.  She is on propranolol LA 120 mg daily for blood pressure.  We have talked about Artane in the past, but she has not wanted to try that.  She has tried only 1 pill of primidone in the past and it sounds like she had first dose effect and has not wanted to retry that.    05/02/15 update:  The patient is following up today regarding her essential tremor.  She is on Inderal LA, 120 mg daily.  Last visit, we cautiously start a low-dose clonazepam 0.5 mg, 1/2 tablet in the morning and half tablet at night. She states that the medication may be helping but not enough.  Fortunately,  it has not made her sleepy.   Her Topamax was discontinued because of side effects of hair loss. Still very stressed over living situation with daughter/grandson living in home.  Doesn't know what she will be doing for Christmas if anything and seems sad about that.  Her daughter is gone right now and her side of the family is going to [place] for christmas.  08/01/15 update:  The patient is following up today regarding her essential tremor.  She is on Inderal LA, 120 mg daily.  Last visit, we cautiously increase her clonazepam 0.5 mg, so that she is taking 1 tablet in the morning and half a tablet at night.  Unfortunately, despite my urging, she has refused to try primidone, because she had a first dose effect in the past when she took one tablet.  She states today that tremor has not been well controlled; she has occasional good days but they are few and far between.  11/04/15 update:  The patient is following up today regarding her essential tremor.  She is on Inderal LA which was increased last visit to 160 mg daily.  She went down to 120 mg daily because she was sleepy and she didn't think that it was helping much and asked me in an email about focused u/s.  I told her that insurance doesn't pay for this right now even though it was FDA approved.  She remains on clonazepam 0.5 mg, so that she is taking 1 tablet in the morning and half a tablet at night.  Unfortunately, despite my urging, she has refused to try primidone, because she had a first dose effect in the past when she took one tablet.  She states today that she continues to have tremor.  She is on prednisone currently because of "inflammation" in the top of the L foot.  She doesn't think that prednisone has changed tremor.   07/08/16 update:  Patient follows up today.  I have not seen her in about 8 months.  I did review her records since last visit.  I cautiously tried her on Artane last visit.  She emailed me in July to state that she thought that  the medication made her tremor worse.  It turns out that when she started the Artane, she stopped her clonazepam, which was not the intention, and is likely the reason that tremor increased.  I asked her to start the clonazepam again, 0.5 mg, one tablet in the morning and half a tablet at night.  I offered her an opinion at an academic center but she declined that.  She states that she cut back on the artane to 1/2 tablet (of the 2mg ) at night because of some dizziness and it seems to be helping some.  She is still on the Inderal LA, 120 g daily.  She denies hallucinations.  She denies falls.  She denies lightheadedness or near syncope.  11/18/16 update:  Pt seen in f/u.   Is on artane 1mg  qhs, inderal LA 120 mg and klonopin 0.5 mg, 1 in the AM and 1/2 at night. Tremor has been about the same.  Has better and worse days.   [facility] changed klonopin manufacturers and the new one isn't as good and more trouble with cutting this new one.   The records that were made available to me were reviewed.  LFTs have been elevated and evaluated.  Fatty liver and gallstones notes on u/s.  Had cholecystectomy on 08/23/16.  Saw GI in May and felt that etiology was Non alcoholic fatty liver from dietary consumption of fast foods daily.  She was referred to dietician and saw her on 11/12/16.  05/24/17 update: Patient is seen today in follow-up for tremor.  She remains on trihexyphenidyl, 1 mg at night.  She is also on Inderal LA, 120 mg daily and clonazepam 0.5 mg, 1 tablet in the morning and half a tablet at night.  Tremor is well controlled "and I can write."  She is adament that she doesn't want to change meds even knowing her pulse.  I have reviewed her records since last visit.  She has been seen by gastroenterology for dysphasia since our last visit.  She had an EGD on March 09, 2017.  Mild Schatzki's ring was noted and was dilated. She isn't sure it helped.  She does state that she doesn't get choked as much but pills will get  stuck.   Liver function testing was much improved 3 months ago compared to 8 months ago.  She attributes this to giving up dark sodas.  She is still eating fast food but not as much as previously  12/27/17 update:  Pt seen in f/u for ET.  On artane 1 mg daily.  Was on inderal LA, 120 mg daily but reports that she changed to propranolol 60 mg bid due to cost. She is on  klonopin, 0.5 mg, 1 in the AM and 1/2 tablet at night.  She ran out of klonopin last week and had more tremor then.  Otherwise tremor has been well controlled.  She has had to have 2 teeth extracted and is awaiting implants.  Admits that diet isn't doing well but trying to limit dark sodas.   pt asks me about memory blood test.  C/o not remembering like used to and daughter thinks she should have test.  Is primarily word finding trouble  06/22/18 update: Patient is seen today in follow-up for essential tremor.  She remains on trihexyphenidyl, 1 mg daily, propranolol, 60 mg twice per day and clonazepam 0.5 mg, 1 tablet in the morning and half a tablet at night.  Records are reviewed since last visit.  She was seen in the emergency room on May 15, 2018 with possible mild diverticulitis.  She was given antibiotics.  She has better now.  Current/Previously tried tremor medications: topamax;  Metoprolol - felt drained ; primidone (50 mg - 1/2 at night but got first dose effect and stopped it); meclizine (? Why); on propranolol now - ? Helping now; gabapentin; artane  Current medications that may exacerbate tremor:  n/a  Outside reports reviewed: historical medical records and referral letter/letters.  Allergies  Allergen Reactions  . Tylenol [Acetaminophen] Other (See Comments)    Increased liver enzymes  . Codeine Nausea And Vomiting  . Topamax [Topiramate] Other (See Comments)    Hair loss    Current Outpatient Medications on File Prior to Visit  Medication Sig Dispense Refill  . cetirizine (ZYRTEC) 10 MG tablet Take 10 mg by  mouth daily.    . Cholecalciferol (VITAMIN D3) 50000 units CAPS Take 1 capsule by mouth once a week. 12 capsule 0  . ciprofloxacin (CIPRO) 500 MG tablet Take 1 tablet (500 mg total) by mouth 2 (two) times daily. 14 tablet 0  . clonazePAM (KLONOPIN) 0.5 MG tablet TAKE 1 TAB BY MOUTH EVERY MORNING AND 1/2 TAB NIGHTLY AT BEDTIME 45 tablet 5  . metroNIDAZOLE (FLAGYL) 500 MG tablet Take 1 tablet (500 mg total) by mouth 2 (two) times daily after a meal. 14 tablet 0  . montelukast (SINGULAIR) 10 MG tablet TAKE 1 TABLET BY MOUTH EVERYDAY AT BEDTIME 90 tablet 3  . propranolol (INDERAL) 60 MG tablet TAKE 1 TABLET (60 MG TOTAL) BY MOUTH 2 (TWO) TIMES DAILY. 180 tablet 2  . trihexyphenidyl (ARTANE) 2 MG tablet Take 1 tablet (2 mg total) by mouth at bedtime. 90 tablet 1  . valACYclovir (VALTREX) 1000 MG tablet Take 2 tabs every 12 hours x 1 day as needed for cold sores 20 tablet 0   No current facility-administered medications on file prior to visit.     Past Medical History:  Diagnosis Date  . Allergic rhinitis, cause unspecified   . Asthma    not bad per pt  . Dermatophytosis of scalp and beard   . Diabetes mellitus without complication ([place])    history of no longer a problem  . Diverticulosis   . Essential and other specified forms of tremor   . Fatty liver   . HTN (hypertension)   . Irritable bowel syndrome   . Lumbago    bulging disk per MRI   . Mixed hyperlipidemia   . Other acute reactions to stress   . Other diseases of lung, not elsewhere classified    solitary pulm. nodule(left)  . PONV (postoperative nausea and vomiting)   . Thyroid nodule   . Unspecified asthma(493.90)   . Unspecified vitamin D deficiency     Past Surgical History:  Procedure Laterality Date  . ABDOMINAL HYSTERECTOMY    . CHOLECYSTECTOMY N/A 08/23/2016   Procedure: LAPAROSCOPIC CHOLECYSTECTOMY;  Surgeon: Clovis Riley, MD;  Location: [facility];  Service: General;  Laterality: N/A;  . COLONOSCOPY    .  ESOPHAGOGASTRODUODENOSCOPY (EGD) WITH PROPOFOL N/A 03/09/2017   Procedure: ESOPHAGOGASTRODUODENOSCOPY (EGD) WITH PROPOFOL;  Surgeon: Jonathon Bellows, MD;  Location: [facility] ENDOSCOPY;  Service: Gastroenterology;  Laterality: N/A;  . LAPAROSCOPIC TOTAL HYSTERECTOMY  2002  . LAPAROSCOPIC UNILATERAL SALPINGO OOPHERECTOMY    . TONSILLECTOMY  1967/68  . TUBAL LIGATION  1986    Social History   Socioeconomic History  . Marital status: Divorced    Spouse name: Not on file  . Number of children: 1  . Years of education: Not on file  . Highest education level: Not on file  Occupational History  . Occupation: retired    Fish farm manager: OTHER    Comment: [employer]: [employer]  . Occupation: part time    Employer: [employer]  . Financial resource strain:  Not on file  . Food insecurity:    Worry: Not on file    Inability: Not on file  . Transportation needs:    Medical: Not on file    Non-medical: Not on file  Tobacco Use  . Smoking status: Former Smoker    Types: Cigarettes    Last attempt to quit: 05/17/1980    Years since quitting: 38.1  . Smokeless tobacco: Never Used  Substance and Sexual Activity  . Alcohol use: No  . Drug use: No  . Sexual activity: Never  Lifestyle  . Physical activity:    Days per week: Not on file    Minutes per session: Not on file  . Stress: Not on file  Relationships  . Social connections:    Talks on phone: Not on file    Gets together: Not on file    Attends religious service: Not on file    Active member of club or organization: Not on file    Attends meetings of clubs or organizations: Not on file    Relationship status: Not on file  . Intimate partner violence:    Fear of current or ex partner: Not on file    Emotionally abused: Not on file    Physically abused: Not on file    Forced sexual activity: Not on file  Other Topics Concern  . 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 Mother  Deceased at age 20       CHF, Essential Tremor  . Father  Deceased       old age, heart disease  . Brother  Alive       diabetes  . Brother  Alive       COPD, heart disease  . Brother  Alive       Benign Essential Tremor  . Sister  Alive       healthy  . Daughter  Alive       anxiety  . Sister  (Not Specified)  . Sister  (Not Specified)  . Sister  (Not Specified)  . Sister  (Not Specified)  . Brother  (Not Specified)  . Brother  (Not Specified)  . Brother  (Not Specified)  . Brother  (Not Specified)  . Other  (Not Specified)  . MGF  (Not Specified)    Review of Systems A complete 10 system ROS was obtained and was negative apart from what is mentioned.   Objective:   VITALS:   There were no vitals filed for this visit. Wt Readings from Last 3 Encounters:  05/15/18 156 lb (70.8 kg)  05/13/18 156 lb (70.8 kg)  03/07/18 156 lb 4 oz (70.9 kg)    GEN:  The patient appears stated age and is in NAD. HEENT:  Normocephalic, atraumatic.  The mucous membranes are moist. The superficial temporal arteries are without ropiness or tenderness. CV:  RRR Lungs:  CTAB Neck/HEME:  There are no carotid bruits bilaterally.  Neurological examination:  Orientation: The patient is alert and oriented x3. Cranial nerves: There is good facial symmetry. The speech is fluent and clear. Soft palate rises symmetrically and there is no tongue deviation. Hearing is intact to  conversational tone. Sensation: Sensation is intact to light touch throughout Motor: Strength is 5/5 in the bilateral upper and lower extremities.   Shoulder shrug is equal and symmetric.  There is no pronator drift.  Movement examination: Tone: There is normal tone in the upper and lower extremities. Abnormal movements: No rest tremor.  Mild postural tremor.  Slightly  increased with intention on both sides.  Some trouble with Archimedes spirals on the left. Coordination:  There is no decremation with RAM's, with any form of RAMS, including alternating supination and pronation of the forearm, hand opening and closing, finger taps, heel taps and toe taps. Gait and Station: The patient walks well down the hall without difficulty.  Labs:  Lab Results  Component Value Date   TSH 1.02 03/01/2018     Chemistry      Component Value Date/Time   NA 140 05/13/2018 1306   K 3.9 05/13/2018 1306   CL 105 05/13/2018 1306   CO2 26 05/13/2018 1306   BUN 11 05/13/2018 1306   CREATININE 0.87 05/13/2018 1306   CREATININE 0.77 10/05/2013 1526      Component Value Date/Time   CALCIUM 9.1 05/13/2018 1306   ALKPHOS 112 05/13/2018 1306   AST 25 05/13/2018 1306   ALT 34 05/13/2018 1306   BILITOT 0.5 05/13/2018 1306        Lab Results  Component Value Date   WBC 9.6 05/13/2018   HGB 13.4 05/13/2018   HCT 44.0 05/13/2018   MCV 93.2 05/13/2018   PLT 242 05/13/2018   Lab Results  Component Value Date   TSH 1.02 03/01/2018   Lab Results  Component Value Date   VITAMINB12 183 (L) 03/01/2018   Lab Results  Component Value Date   HGBA1C 5.9 (A) 03/07/2018      Assessment/Plan:   1.  Essential Tremor.  --She finally feels that she has good control on her essential tremor and really does not want to change medications.  I talked to her about the fact that she has had intermittent bradycardia, which is likely due to her Inderal LA.  She insists that she has had no signs or symptoms of this and really does not want to change it.  She states that it took a long time for her tremor to be well controlled and would like to not make changes.  She will certainly let me know if she has any lightheadedness or near syncopal feelings.  For now, she opted to remain on propranolol, 60 mg twice per day.  She is also on Artane, 2 mg once per day as well as clonazepam 0.5 mg,  1 tablet in the morning and half a tablet at night.    2.  Mild B12 deficiency.  -back on b12 supplement 3.  Depression  -no SI/HI 4.  Non alcoholic fatty liver disease  -due to consumption of fast foods/poor diet .  Liver function studies are markedly improved with improvement of diet and discontinuation of sodas. 5.   Memory change  -Patient asked me about apoE testing.  I do not think she needs this.  I do not think she has Alzheimer's disease clinically.  Talk to her about neurocognitive testing, but she wants to avoid.  If anything, I told her that it would be her medications causing some of the symptoms, especially trihexyphenidyl.  She absolutely does not want to change that medication.  I told her we could continue to monitor and address this in the future.  She was agreeable. 6.  ***

## 2018-06-22 ENCOUNTER — Ambulatory Visit: Payer: Medicare HMO | Admitting: Neurology

## 2018-06-24 ENCOUNTER — Other Ambulatory Visit: Payer: Self-pay | Admitting: Family Medicine

## 2018-06-26 ENCOUNTER — Other Ambulatory Visit: Payer: Self-pay | Admitting: Family Medicine

## 2018-06-28 ENCOUNTER — Telehealth: Payer: Self-pay | Admitting: Family Medicine

## 2018-06-28 NOTE — Telephone Encounter (Signed)
[facility] [address] [facility] for #180 with 2 refills.

## 2018-06-28 NOTE — Telephone Encounter (Signed)
Need refill for    Propranolol 60mg      Sent to [facility]

## 2018-06-28 NOTE — Telephone Encounter (Signed)
Spoke [facility] at [facility].  They state they never received the refill that was sent on 06/19/2018.  Verbal order given.  They will get this ready and notify patient.

## 2018-07-03 ENCOUNTER — Ambulatory Visit (INDEPENDENT_AMBULATORY_CARE_PROVIDER_SITE_OTHER): Payer: Medicare HMO | Admitting: Internal Medicine

## 2018-07-03 ENCOUNTER — Encounter: Payer: Self-pay | Admitting: Internal Medicine

## 2018-07-03 ENCOUNTER — Telehealth: Payer: Self-pay

## 2018-07-03 VITALS — BP 118/64 | HR 62 | Temp 97.8°F | Ht 65.25 in | Wt 159.5 lb

## 2018-07-03 DIAGNOSIS — M25472 Effusion, left ankle: Secondary | ICD-10-CM | POA: Diagnosis not present

## 2018-07-03 NOTE — Progress Notes (Signed)
Subjective:    Patient ID: Michelle Donovan, female    DOB: 05-26-1945, 73 y.o.   MRN: [id number]  HPI Here due to left leg and foot swelling  Has had 2 stress fractures in left foot---many years ago Has noticed it again within the past year---thought it was fractured again Saw Dr Hyatt---x-ray negative. Got cortisone shot  First had swelling (at least intermittently) at least 10 years ago Now continuous swelling over the past 6 months or so Does have more swelling in the summer Swelling over the lateral malleolus never goes away Some in foot as well Occasionally up calf slightlly  Spider bite years ago above medial malleolus---chronic brown area Now painful  Has tried compression sock--doesn't help  Current Outpatient Medications on File Prior to Visit  Medication Sig Dispense Refill  . cetirizine (ZYRTEC) 10 MG tablet Take 10 mg by mouth daily.    . clonazePAM (KLONOPIN) 0.5 MG tablet TAKE 1 TAB BY MOUTH EVERY MORNING AND 1/2 TAB NIGHTLY AT BEDTIME 45 tablet 5  . montelukast (SINGULAIR) 10 MG tablet TAKE 1 TABLET BY MOUTH EVERYDAY AT BEDTIME 90 tablet 3  . propranolol (INDERAL) 60 MG tablet TAKE 1 TABLET (60 MG TOTAL) BY MOUTH 2 (TWO) TIMES DAILY. 180 tablet 2  . trihexyphenidyl (ARTANE) 2 MG tablet Take 1 tablet (2 mg total) by mouth at bedtime. 90 tablet 1  . valACYclovir (VALTREX) 1000 MG tablet Take 2 tabs every 12 hours x 1 day as needed for cold sores 20 tablet 0   No current facility-administered medications on file prior to visit.     Allergies  Allergen Reactions  . Tylenol [Acetaminophen] Other (See Comments)    Increased liver enzymes  . Codeine Nausea And Vomiting  . Topamax [Topiramate] Other (See Comments)    Hair loss    Past Medical History:  Diagnosis Date  . Allergic rhinitis, cause unspecified   . Asthma    not bad per pt  . Dermatophytosis of scalp and beard   . Diabetes mellitus without complication ([place])    history of no longer a problem   . Diverticulosis   . Essential and other specified forms of tremor   . Fatty liver   . HTN (hypertension)   . Irritable bowel syndrome   . Lumbago    bulging disk per MRI   . Mixed hyperlipidemia   . Other acute reactions to stress   . Other diseases of lung, not elsewhere classified    solitary pulm. nodule(left)  . PONV (postoperative nausea and vomiting)   . Thyroid nodule   . Unspecified asthma(493.90)   . Unspecified vitamin D deficiency     Past Surgical History:  Procedure Laterality Date  . ABDOMINAL HYSTERECTOMY    . CHOLECYSTECTOMY N/A 08/23/2016   Procedure: LAPAROSCOPIC CHOLECYSTECTOMY;  Surgeon: Clovis Riley, MD;  Location: [facility];  Service: General;  Laterality: N/A;  . COLONOSCOPY    . ESOPHAGOGASTRODUODENOSCOPY (EGD) WITH PROPOFOL N/A 03/09/2017   Procedure: ESOPHAGOGASTRODUODENOSCOPY (EGD) WITH PROPOFOL;  Surgeon: Jonathon Bellows, MD;  Location: [facility];  Service: Gastroenterology;  Laterality: N/A;  . LAPAROSCOPIC TOTAL HYSTERECTOMY  2002  . LAPAROSCOPIC UNILATERAL SALPINGO OOPHERECTOMY    . TONSILLECTOMY  1967/68  . TUBAL LIGATION  1986    Family History  Problem Relation Age of Onset  . Stroke Mother   . Tremor Mother   . Diabetes Mother   . Heart failure Mother   . Other Father  Trigeminal neuralgia  . Diabetes Sister   . Coronary artery disease Sister   . Irritable bowel syndrome Sister   . Tremor Sister   . Diabetes Brother        x 3  . Coronary artery disease Brother        x 3  . Irritable bowel syndrome Brother        x 3  . Tremor Brother        x 3  . Breast cancer Other        Aunts and cousin  . Colon cancer Maternal Grandfather     Social History   Socioeconomic History  . Marital status: Divorced    Spouse name: Not on file  . Number of children: 1  . Years of education: Not on file  . Highest education level: Not on file  Occupational History  . Occupation: retired    Fish farm manager: OTHER    Comment: [place]: [place]  . Occupation: part time    Employer: [employer]  . Financial resource strain: Not on file  . Food insecurity:    Worry: Not on file    Inability: Not on file  . Transportation needs:    Medical: Not on file    Non-medical: Not on file  Tobacco Use  . Smoking status: Former Smoker    Types: Cigarettes    Last attempt to quit: 05/17/1980    Years since quitting: 38.1  . Smokeless tobacco: Never Used  Substance and Sexual Activity  . Alcohol use: No  . Drug use: No  . Sexual activity: Never  Lifestyle  . Physical activity:    Days per week: Not on file    Minutes per session: Not on file  . Stress: Not on file  Relationships  . Social connections:    Talks on phone: Not on file    Gets together: Not on file    Attends religious service: Not on file    Active member of club or organization: Not on file    Attends meetings of clubs or organizations: Not on file    Relationship status: Not on file  . Intimate partner violence:    Fear of current or ex partner: Not on file    Emotionally abused: Not on file    Physically abused: Not on file    Forced sexual activity: Not on file  Other Topics Concern  . 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

## 2018-07-03 NOTE — Telephone Encounter (Signed)
Pt said for few months pt has had swelling in lt leg,ankle and foot with a red spot on foot. Pt has hx of stress fx in foot and saw Dr Milinda Pointer few months ago and was told no stress fx. Sometimes the swelling goes down overnight and sometimes the swelling stays the same overnight.Pt  has started with pain in lower lt leg where previously had been a spider bite. Offered pt appt with Dr Diona Browner on 07/04/18 but pt could not make that appt and requested to be seen today. Pt scheduled appt with Dr Silvio Pate 07/03/18 at 4:30. Pt is not having CP or SOB. FYI to Dr Silvio Pate.

## 2018-07-03 NOTE — Telephone Encounter (Signed)
Okay--I will check her at the visit

## 2018-07-03 NOTE — Assessment & Plan Note (Signed)
Very focal on lateral side ?related to past ligament injury or fractures (but they were in foot) Nothing to suggest systemic disease (normal renal and hepatic function and this doesn't suggest CHF Reassured that this doesn't seem to be a serious problem--and we are unlikely to find a clear answer. She will consider all this---if she wants to take another step, would set up with vascular surgeon (just in case)

## 2018-07-04 ENCOUNTER — Ambulatory Visit: Payer: Medicare HMO | Admitting: Neurology

## 2018-07-06 ENCOUNTER — Ambulatory Visit: Payer: Medicare HMO | Admitting: Neurology

## 2018-07-13 ENCOUNTER — Other Ambulatory Visit: Payer: Self-pay | Admitting: Family Medicine

## 2018-07-13 DIAGNOSIS — R2242 Localized swelling, mass and lump, left lower limb: Secondary | ICD-10-CM

## 2018-07-19 ENCOUNTER — Encounter: Payer: Self-pay | Admitting: Family Medicine

## 2018-07-19 DIAGNOSIS — Z85828 Personal history of other malignant neoplasm of skin: Secondary | ICD-10-CM | POA: Diagnosis not present

## 2018-07-19 DIAGNOSIS — L821 Other seborrheic keratosis: Secondary | ICD-10-CM | POA: Diagnosis not present

## 2018-07-19 DIAGNOSIS — D485 Neoplasm of uncertain behavior of skin: Secondary | ICD-10-CM | POA: Diagnosis not present

## 2018-07-19 DIAGNOSIS — C44612 Basal cell carcinoma of skin of right upper limb, including shoulder: Secondary | ICD-10-CM | POA: Diagnosis not present

## 2018-07-19 DIAGNOSIS — D225 Melanocytic nevi of trunk: Secondary | ICD-10-CM | POA: Diagnosis not present

## 2018-07-19 DIAGNOSIS — L570 Actinic keratosis: Secondary | ICD-10-CM | POA: Diagnosis not present

## 2018-07-19 DIAGNOSIS — L814 Other melanin hyperpigmentation: Secondary | ICD-10-CM | POA: Diagnosis not present

## 2018-07-19 DIAGNOSIS — C44519 Basal cell carcinoma of skin of other part of trunk: Secondary | ICD-10-CM | POA: Diagnosis not present

## 2018-07-19 DIAGNOSIS — C44719 Basal cell carcinoma of skin of left lower limb, including hip: Secondary | ICD-10-CM | POA: Diagnosis not present

## 2018-07-19 DIAGNOSIS — C44712 Basal cell carcinoma of skin of right lower limb, including hip: Secondary | ICD-10-CM | POA: Diagnosis not present

## 2018-07-19 DIAGNOSIS — D1801 Hemangioma of skin and subcutaneous tissue: Secondary | ICD-10-CM | POA: Diagnosis not present

## 2018-07-21 ENCOUNTER — Ambulatory Visit (INDEPENDENT_AMBULATORY_CARE_PROVIDER_SITE_OTHER): Payer: Medicare HMO | Admitting: Nurse Practitioner

## 2018-07-21 ENCOUNTER — Other Ambulatory Visit: Payer: Self-pay | Admitting: Neurology

## 2018-07-21 ENCOUNTER — Encounter (INDEPENDENT_AMBULATORY_CARE_PROVIDER_SITE_OTHER): Payer: Self-pay | Admitting: Vascular Surgery

## 2018-07-21 ENCOUNTER — Other Ambulatory Visit: Payer: Self-pay

## 2018-07-21 VITALS — BP 128/71 | HR 64 | Resp 16 | Ht 65.0 in | Wt 158.8 lb

## 2018-07-21 DIAGNOSIS — M79606 Pain in leg, unspecified: Secondary | ICD-10-CM | POA: Insufficient documentation

## 2018-07-21 DIAGNOSIS — I10 Essential (primary) hypertension: Secondary | ICD-10-CM

## 2018-07-21 DIAGNOSIS — Z79899 Other long term (current) drug therapy: Secondary | ICD-10-CM

## 2018-07-21 DIAGNOSIS — M25472 Effusion, left ankle: Secondary | ICD-10-CM | POA: Diagnosis not present

## 2018-07-21 DIAGNOSIS — Z87891 Personal history of nicotine dependence: Secondary | ICD-10-CM

## 2018-07-21 DIAGNOSIS — M79605 Pain in left leg: Secondary | ICD-10-CM

## 2018-07-21 NOTE — Telephone Encounter (Signed)
She has appt scheduled in May. Last note states to continue these medications. Can you please sign.

## 2018-07-21 NOTE — Progress Notes (Signed)
SUBJECTIVE:  Patient ID: Michelle Donovan, female    DOB: 1946/01/24, 73 y.o.   MRN: [id number] Chief Complaint  Patient presents with  . New Patient (Initial Visit)    ref [place] swelling    HPI  Michelle Donovan is a 73 y.o. female that is referred by Dr. Josefa Half with concern for swelling in her left lower extremity.Patient is seen for evaluation of leg pain and leg swelling. The patient first noticed the swelling remotely. The swelling is associated with pain and discoloration. The pain and swelling worsens with prolonged dependency and improves with elevation.  She does describe the pain as being related to activity.  She states that her toes as well as Her little bit more with ambulation.  The patient notes that in the morning the legs are significantly improved but they steadily worsened throughout the course of the day. The patient also notes a steady worsening of the discoloration in the ankle and shin area.   The patient also has a spot right above her ankle where she was previously bitten by a Julieanne Hadsall recluse spider.  She states that there is intensifying pain in that area that is associated with swelling as well.  She stated that the swelling typically did not happen until after that spider bite.    The patient denies symptoms consistent with rest pain.  The patient denies and extensive history of DJD and LS spine disease.  The patient has no had any past angiography, interventions or vascular surgery.  Elevation makes the leg symptoms better, dependency makes them much worse. There is no history of ulcerations. The patient denies any recent changes in medications.  Patient tried wearing graduated compression stockings for approximately 1 day however felt that they did not help.  The patient denies a history of DVT or PE. There is no prior history of phlebitis. There is no history of primary lymphedema.  No history of malignancies. No history of trauma or groin or pelvic  surgery. There is no history of radiation treatment to the groin or pelvis  The patient denies amaurosis fugax or recent TIA symptoms. There are no recent neurological changes noted. The patient denies recent episodes of angina or shortness of breath  Past Medical History:  Diagnosis Date  . Allergic rhinitis, cause unspecified   . Asthma    not bad per pt  . Dermatophytosis of scalp and beard   . Diabetes mellitus without complication ([place])    history of no longer a problem  . Diverticulosis   . Essential and other specified forms of tremor   . Fatty liver   . HTN (hypertension)   . Irritable bowel syndrome   . Lumbago    bulging disk per MRI   . Mixed hyperlipidemia   . Other acute reactions to stress   . Other diseases of lung, not elsewhere classified    solitary pulm. nodule(left)  . PONV (postoperative nausea and vomiting)   . Thyroid nodule   . Unspecified asthma(493.90)   . Unspecified vitamin D deficiency     Past Surgical History:  Procedure Laterality Date  . ABDOMINAL HYSTERECTOMY    . CHOLECYSTECTOMY N/A 08/23/2016   Procedure: LAPAROSCOPIC CHOLECYSTECTOMY;  Surgeon: Clovis Riley, MD;  Location: [facility];  Service: General;  Laterality: N/A;  . COLONOSCOPY    . ESOPHAGOGASTRODUODENOSCOPY (EGD) WITH PROPOFOL N/A 03/09/2017   Procedure: ESOPHAGOGASTRODUODENOSCOPY (EGD) WITH PROPOFOL;  Surgeon: Jonathon Bellows, MD;  Location: [facility] ENDOSCOPY;  Service: Gastroenterology;  Laterality: N/A;  . LAPAROSCOPIC TOTAL HYSTERECTOMY  2002  . LAPAROSCOPIC UNILATERAL SALPINGO OOPHERECTOMY    . TONSILLECTOMY  1967/68  . TUBAL LIGATION  1986    Social History   Socioeconomic History  . Marital status: Divorced    Spouse name: Not on file  . Number of children: 1  . Years of education: Not on file  . Highest education level: Not on file  Occupational History  . 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 Occupation: part time      Employer: [employer]  . Financial resource strain: Not on file  . Food insecurity:    Worry: Not on file    Inability: Not on file  . Transportation needs:    Medical: Not on file    Non-medical: Not on file  Tobacco Use  . Smoking status: Former Smoker    Types: Cigarettes    Last attempt to quit: 05/17/1980    Years since quitting: 38.2  . Smokeless tobacco: Never Used  Substance and Sexual Activity  . Alcohol use: No  . Drug use: No  . Sexual activity: Never  Lifestyle  . Physical activity:    Days per week: Not on file    Minutes per session: Not on file  . Stress: Not on file  Relationships  . Social connections:    Talks on phone: Not on file    Gets together: Not on file    Attends religious service: Not on file    Active member of club or organization: Not on file    Attends meetings of clubs or organizations: Not on file    Relationship status: Not on file  . Intimate partner violence:    Fear of current or ex partner: Not on file    Emotionally abused: Not on file    Physically abused: Not on file    Forced sexual activity: Not on file  Other Topics Concern  . 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 Stroke Mother   . Tremor Mother   . Diabetes Mother   . Heart failure Mother   . Other Father        Trigeminal neuralgia  . Diabetes Sister   . Coronary artery disease Sister   . Irritable bowel syndrome Sister   . Tremor Sister   . Diabetes Brother        x 3  . Coronary artery disease Brother        x 3  . Irritable bowel syndrome Brother        x 3  . Tremor Brother        x 3  . Breast cancer Other        Aunts and cousin  . Colon cancer Maternal Grandfather     Allergies  Allergen Reactions  . Tylenol [Acetaminophen] Other  (See Comments)    Increased liver enzymes  . Codeine Nausea And Vomiting  . Topamax [Topiramate] Other (See Comments)    Hair loss     Review of Systems   Review of  Systems: Negative Unless Checked Constitutional: [] Weight loss  [] Fever  [] Chills Cardiac: [] Chest pain   []  Atrial Fibrillation  [] Palpitations   [] Shortness of breath when laying flat   [] Shortness of breath with exertion. [] Shortness of breath at rest Vascular:  [] Pain in legs with walking   [] Pain in legs with standing [] Pain in legs when laying flat   [x] Claudication    [] Pain in feet when laying flat    [] History of DVT   [] Phlebitis   [x] Swelling in legs   [x] Varicose veins   [] Non-healing ulcers Pulmonary:   [] Uses home oxygen   [] Productive cough   [] Hemoptysis   [] Wheeze  [] COPD   [x] Asthma Neurologic:  [] Dizziness   [] Seizures  [] Blackouts [] History of stroke   [] History of TIA  [] Aphasia   [] Temporary Blindness   [] Weakness or numbness in arm   [] Weakness or numbness in leg Musculoskeletal:   [] Joint swelling   [] Joint pain   [] Low back pain  []  History of Knee Replacement [] Arthritis [] back Surgeries  []  Spinal Stenosis    Hematologic:  [] Easy bruising  [] Easy bleeding   [] Hypercoagulable state   [] Anemic Gastrointestinal:  [] Diarrhea   [] Vomiting  [] Gastroesophageal reflux/heartburn   [] Difficulty swallowing. [] Abdominal pain Genitourinary:  [] Chronic kidney disease   [] Difficult urination  [] Anuric   [] Blood in urine [] Frequent urination  [] Burning with urination   [] Hematuria Skin:  [] Rashes   [] Ulcers [] Wounds Psychological:  [] History of anxiety   []  History of major depression  []  Memory Difficulties      OBJECTIVE:   Physical Exam  BP 128/71 (BP Location: Right Arm)   Pulse 64   Resp 16   Ht 5\' 5"  (1.651 m)   Wt 158 lb 12.8 oz (72 kg)   BMI 26.43 kg/m   Gen: WD/WN, NAD Head: [place] ARB/AT, No temporalis wasting.  Ear/Nose/Throat: Hearing grossly intact, nares w/o erythema or drainage Eyes: PER, EOMI,  sclera nonicteric.  Neck: Supple, no masses.  No JVD.  Pulmonary:  Good air movement, no use of accessory muscles.  Cardiac: RRR Vascular:  2+ edema mainly around left ankle to mid shin. Vessel Right Left  Radial Palpable Palpable  Dorsalis Pedis  trace palpable  trace palpable  Posterior Tibial  trace palpable  trace palpable   Gastrointestinal: soft, non-distended. No guarding/no peritoneal signs.  Musculoskeletal: M/S 5/5 throughout.  No deformity or atrophy.  Neurologic: Pain and light touch intact in extremities.  Symmetrical.  Speech is fluent. Motor exam as listed above. Psychiatric: Judgment intact, Mood & affect appropriate for pt's clinical situation. Dermatologic: No Venous rashes. No Ulcers Noted.  No changes consistent with cellulitis. Lymph : No Cervical lymphadenopathy, no lichenification or skin changes of chronic lymphedema.       ASSESSMENT AND PLAN:  1. Edema of left ankle I have had a long discussion with the patient regarding swelling and why it  causes symptoms.  Patient will begin wearing graduated compression stockings class 1 (20-30 mmHg) on a daily basis a prescription was given. The patient will  beginning wearing the stockings first thing in the morning and removing them in the evening. The patient is instructed specifically not to sleep in the stockings.   In addition, behavioral modification will be initiated.  This will include frequent elevation, use of over the counter pain medications and exercise such as walking.  I have reviewed systemic causes for chronic edema such as liver, kidney and cardiac etiologies.  The patient denies problems with these organ systems.  Consideration for a lymph pump will also be made based upon the effectiveness of conservative therapy.  This would help to improve the edema control and prevent sequela such as ulcers and infections   Patient should undergo duplex ultrasound of the venous system to ensure that DVT or reflux  is not present.  The patient will follow-up with me after the ultrasound.   - VAS [place] LOWER EXTREMITY VENOUS REFLUX; Future  2. Pain of left lower extremity  Recommend:  The patient has atypical pain symptoms for pure atherosclerotic disease. 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  Noninvasive studies including ABI's and venous ultrasound of the legs will be obtained and the patient will follow up with me to review these studies.  The patient should continue walking and begin a more formal exercise program. The patient should continue his antiplatelet therapy and aggressive treatment of the lipid abnormalities.   - VAS [place] ABI WITH/WO TBI; Future  3. Benign essential hypertension Continue antihypertensive medications as already ordered, these medications have been reviewed and there are no changes at this time.    Current Outpatient Medications on File Prior to Visit  Medication Sig Dispense Refill  . cetirizine (ZYRTEC) 10 MG tablet Take 10 mg by mouth daily.    . clonazePAM (KLONOPIN) 0.5 MG tablet TAKE 1 TAB BY MOUTH EVERY MORNING AND 1/2 TAB NIGHTLY AT BEDTIME 45 tablet 5  . montelukast (SINGULAIR) 10 MG tablet TAKE 1 TABLET BY MOUTH EVERYDAY AT BEDTIME 90 tablet 3  . propranolol (INDERAL) 60 MG tablet TAKE 1 TABLET (60 MG TOTAL) BY MOUTH 2 (TWO) TIMES DAILY. 180 tablet 2  . trihexyphenidyl (ARTANE) 2 MG tablet Take 1 tablet (2 mg total) by mouth at bedtime. 90 tablet 1  . valACYclovir (VALTREX) 1000 MG tablet Take 2 tabs every 12 hours x 1 day as needed for cold sores 20 tablet 0   No current facility-administered medications on file prior to visit.     There are no Patient Instructions on file for this visit. No follow-ups on file.   Kris Hartmann, NP  This note was completed with Sales executive.  Any errors are purely unintentional.

## 2018-07-26 ENCOUNTER — Encounter: Payer: Self-pay | Admitting: Neurology

## 2018-07-26 NOTE — Progress Notes (Signed)
Received authorization for Trihexyphenidyl valid 05/15/2018-05/17/2019. [id number]#[id number].

## 2018-08-03 ENCOUNTER — Other Ambulatory Visit: Payer: Self-pay

## 2018-08-03 ENCOUNTER — Ambulatory Visit (INDEPENDENT_AMBULATORY_CARE_PROVIDER_SITE_OTHER): Payer: Medicare HMO | Admitting: Nurse Practitioner

## 2018-08-03 ENCOUNTER — Encounter (INDEPENDENT_AMBULATORY_CARE_PROVIDER_SITE_OTHER): Payer: Self-pay | Admitting: Nurse Practitioner

## 2018-08-03 ENCOUNTER — Telehealth: Payer: Self-pay | Admitting: Gastroenterology

## 2018-08-03 ENCOUNTER — Ambulatory Visit (INDEPENDENT_AMBULATORY_CARE_PROVIDER_SITE_OTHER): Payer: Medicare HMO

## 2018-08-03 VITALS — BP 127/74 | HR 76 | Resp 10 | Ht 65.0 in | Wt 159.0 lb

## 2018-08-03 DIAGNOSIS — M25472 Effusion, left ankle: Secondary | ICD-10-CM | POA: Diagnosis not present

## 2018-08-03 DIAGNOSIS — Z79899 Other long term (current) drug therapy: Secondary | ICD-10-CM | POA: Diagnosis not present

## 2018-08-03 DIAGNOSIS — M79605 Pain in left leg: Secondary | ICD-10-CM

## 2018-08-03 DIAGNOSIS — Z7901 Long term (current) use of anticoagulants: Secondary | ICD-10-CM | POA: Diagnosis not present

## 2018-08-03 DIAGNOSIS — J4530 Mild persistent asthma, uncomplicated: Secondary | ICD-10-CM

## 2018-08-03 DIAGNOSIS — Z87891 Personal history of nicotine dependence: Secondary | ICD-10-CM

## 2018-08-03 NOTE — Telephone Encounter (Signed)
Pt called back regarding apt she will call us back and is aware about e visit her symptoms are good for now

## 2018-08-03 NOTE — Telephone Encounter (Signed)
Left vm for pt to call office and  R/s apt for 08/10/18 due to corona virus or offer E- visit if still having symptoms

## 2018-08-10 ENCOUNTER — Encounter (INDEPENDENT_AMBULATORY_CARE_PROVIDER_SITE_OTHER): Payer: Self-pay | Admitting: Nurse Practitioner

## 2018-08-10 ENCOUNTER — Ambulatory Visit: Payer: Medicare HMO | Admitting: Gastroenterology

## 2018-08-10 NOTE — Progress Notes (Signed)
SUBJECTIVE:  Patient ID: Michelle Donovan, female    DOB: Jun 26, 1945, 73 y.o.   MRN: [id number] Chief Complaint  Patient presents with   Follow-up    HPI  Michelle Donovan is a 73 y.o. female The patient is seen for evaluation of painful lower extremities. Patient notes the pain is variable and not always associated with activity.  The pain is located near her ankle where she had a previous bite from [place] recluse.  The pain also radiates around her ankle to her heel.  The pain is somewhat consistent day to day occurring on most days. The patient notes the pain also occurs with standing and routinely seems worse as the day wears on. The pain has been progressive over the past several years. The patient states these symptoms are causing  a profound negative impact on quality of life and daily activities.  The patient denies rest pain or dangling of an extremity off the side of the bed during the night for relief. No open wounds or sores at this time. No history of DVT or phlebitis. No prior interventions or surgeries.  There is a  history of back problems and DJD of the lumbar and sacral spine.   Today the patient underwent a lower extremity venous reflux study which found reflux in her common femoral vein and femoral vein at the thigh.  No evidence of DVT or superficial venous thrombosis.  ABIs were also done.  Her right ABI is 1.12 with her left being 1.10.  Her tibial arteries have triphasic waveforms bilaterally.  There are no previous test to compare to. Past Medical History:  Diagnosis Date   Allergic rhinitis, cause unspecified    Asthma    not bad per pt   Dermatophytosis of scalp and beard    Diabetes mellitus without complication ([place])    history of no longer a problem   Diverticulosis    Essential and other specified forms of tremor    Fatty liver    HTN (hypertension)    Irritable bowel syndrome    Lumbago    bulging disk per MRI    Mixed  hyperlipidemia    Other acute reactions to stress    Other diseases of lung, not elsewhere classified    solitary pulm. nodule(left)   PONV (postoperative nausea and vomiting)    Thyroid nodule    Unspecified asthma(493.90)    Unspecified vitamin D deficiency     Past Surgical History:  Procedure Laterality Date   ABDOMINAL HYSTERECTOMY     CHOLECYSTECTOMY N/A 08/23/2016   Procedure: LAPAROSCOPIC CHOLECYSTECTOMY;  Surgeon: Clovis Riley, MD;  Location: [facility];  Service: General;  Laterality: N/A;   COLONOSCOPY     ESOPHAGOGASTRODUODENOSCOPY (EGD) WITH PROPOFOL N/A 03/09/2017   Procedure: ESOPHAGOGASTRODUODENOSCOPY (EGD) WITH PROPOFOL;  Surgeon: Jonathon Bellows, MD;  Location: [facility] END[facility]COPY;  Service: Gastroenterology;  Laterality: N/A;   LAPAROSCOPIC TOTAL HYSTERECTOMY  2002   LAPAROSCOPIC UNILATERAL SALPINGO OOPHERECTOMY     TONSILLECTOMY  1967/68   TUBAL LIGATION  1986    Social History   Socioeconomic History   Marital status: Divorced    Spouse name: Not on file   Number of children: 1   Years of education: Not on file   Highest education level: Not on file  Occupational History   Occupation: retired    Fish farm manager: OTHER    Comment: [place]: RETIRED   Occupation: part time    Fish farm manager:  [place] [employer]  Social Needs   Financial resource strain: Not on file   Food insecurity:    Worry: Not on file    Inability: Not on file   Transportation needs:    Medical: Not on file    Non-medical: Not on file  Tobacco Use   Smoking status: Former Smoker    Types: Cigarettes    Last attempt to quit: 05/17/1980    Years since quitting: 38.2   Smokeless tobacco: Never Used  Substance and Sexual Activity   Alcohol use: No   Drug use: No   Sexual activity: Never  Lifestyle   Physical activity:    Days per week: Not on file    Minutes per session: Not on file   Stress: Not on file  Relationships   Social connections:     Talks on phone: Not on file    Gets together: Not on file    Attends religious service: Not on file    Active member of club or organization: Not on file    Attends meetings of clubs or organizations: Not on file    Relationship status: Not on file   Intimate partner violence:    Fear of current or ex partner: Not on file    Emotionally abused: Not on file    Physically abused: Not on file    Forced sexual activity: Not on file  Other Topics Concern   Not on file  Social History Narrative   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[place]      Divorced      1 daughter-healthy      No regular exercise      Some veggies; rare fruit          Family History  Problem Relation Age of Onset   Stroke Mother    Tremor Mother    Diabetes Mother    Heart failure Mother    Other Father        Trigeminal neuralgia   Diabetes Sister    Coronary artery disease Sister    Irritable bowel syndrome Sister    Tremor Sister    Diabetes Brother        x 3   Coronary artery disease Brother        x 3   Irritable bowel syndrome Brother        x 3   Tremor Brother        x 3   Breast cancer Other        Aunts and cousin   Colon cancer Maternal Grandfather     Allergies  Allergen Reactions   Tylenol [Acetaminophen] Other (See Comments)    Increased liver enzymes   Codeine Nausea And Vomiting   Topamax [Topiramate] Other (See Comments)    Hair loss     Review of Systems   Review of Systems: Negative Unless Checked Constitutional: [] Weight loss  [] Fever  [] Chills Cardiac: [] Chest pain   []  Atrial Fibrillation  [] Palpitations   [] Shortness of breath when laying flat   [] Shortness of breath with exertion. [] Shortness of breath at rest Vascular:  [x] Pain in legs with walking   [x] Pain in legs with standing [] Pain in legs when laying flat   [] Claudication    [x] Pain in feet when laying flat    [] History of DVT   [] Phlebitis   [x] Swelling in legs   [x] Varicose veins   [] Non-healing  ulcers Pulmonary:   [] Uses home oxygen   [] Productive cough   []   Hemoptysis   [] Wheeze  [] COPD   [] Asthma Neurologic:  [] Dizziness   [] Seizures  [] Blackouts [] History of stroke   [] History of TIA  [] Aphasia   [] Temporary Blindness   [] Weakness or numbness in arm   [] Weakness or numbness in leg Musculoskeletal:   [] Joint swelling   [x] Joint pain   [] Low back pain  []  History of Knee Replacement [] Arthritis [] back Surgeries  []  Spinal Stenosis    Hematologic:  [] Easy bruising  [] Easy bleeding   [] Hypercoagulable state   [] Anemic Gastrointestinal:  [] Diarrhea   [] Vomiting  [] Gastroesophageal reflux/heartburn   [] Difficulty swallowing. [] Abdominal pain Genitourinary:  [] Chronic kidney disease   [] Difficult urination  [] Anuric   [] Blood in urine [] Frequent urination  [] Burning with urination   [] Hematuria Skin:  [] Rashes   [] Ulcers [] Wounds Psychological:  [] History of anxiety   []  History of major depression  []  Memory Difficulties      OBJECTIVE:   Physical Exam  BP 127/74 (BP Location: Left Arm, Patient Position: Sitting, Cuff Size: Small)    Pulse 76    Resp 10    Ht 5\' 5"  (1.651 m)    Wt 159 lb (72.1 kg)    BMI 26.46 kg/m   Gen: WD/WN, NAD Head: [place]/AT, No temporalis wasting.  Ear/Nose/Throat: Hearing grossly intact, nares w/o erythema or drainage Eyes: PER, EOMI, sclera nonicteric.  Neck: Supple, no masses.  No JVD.  Pulmonary:  Good air movement, no use of accessory muscles.  Cardiac: RRR Vascular:  +1 edema at her ankle Vessel Right Left  Radial Palpable Palpable  Dorsalis Pedis Palpable Palpable  Posterior Tibial Palpable Palpable   Gastrointestinal: soft, non-distended. No guarding/no peritoneal signs.  Musculoskeletal: M/S 5/5 throughout.  No deformity or atrophy.  Neurologic: Pain and light touch intact in extremities.  Symmetrical.  Speech is fluent. Motor exam as listed above. Psychiatric: Judgment intact, Mood & affect appropriate for pt's clinical situation. Dermatologic:  No Venous rashes. No Ulcers Noted.  No changes consistent with cellulitis. Lymph : No Cervical lymphadenopathy, no lichenification or skin changes of chronic lymphedema.       ASSESSMENT AND PLAN:  1. Pain of left lower extremity Recommend:  I do not find evidence of Vascular pathology that would explain the patient's symptoms  The patient has atypical pain symptoms for vascular disease  Noninvasive studies including venous ultrasound of the legs do not identify vascular problems  The patient should continue walking and begin a more formal exercise program. The patient should continue his antiplatelet therapy and aggressive treatment of the lipid abnormalities. The patient should begin wearing graduated compression socks 15-20 mmHg strength to control her mild edema.  Patient will follow-up with me on a PRN basis  Further work-up of her lower extremity pain is deferred to the primary service   Continued pain near site of spider bite could be neurological in nature due to nerve damage. Patient has an upcoming appointment with neurology.    2. Edema of left ankle No surgery or intervention at this point in time.  I have reviewed my discussion with the patient regarding venous insufficiency and why it causes symptoms. I have discussed with the patient the chronic skin changes that accompany venous insufficiency and the long term sequela such as ulceration. Patient will contnue wearing graduated compression stockings on a daily basis, as this has provided excellent control of his edema. The patient will put the stockings on first thing in the morning and removing them in the evening. The patient is  reminded not to sleep in the stockings.  In addition, behavioral modification including elevation during the day will be initiated. Exercise is strongly encouraged.  Today the patient underwent a lower extremity venous reflux study which found reflux in her common femoral vein and femoral  vein at the thigh.  No evidence of DVT or superficial venous thrombosis.  Given the patient's good control and lack of any problems regarding the venous insufficiency and lymphedema a lymph pump in not need at this time.  The patient will follow up with me in 6 months to assess swelling.  The patient voices agreement with this plan.   3. Mild persistent asthma without complication Continue pulmonary medications and aerosols as already ordered, these medications have been reviewed and there are no changes at this time.     Current Outpatient Medications on File Prior to Visit  Medication Sig Dispense Refill   cetirizine (ZYRTEC) 10 MG tablet Take 10 mg by mouth daily.     clonazePAM (KLONOPIN) 0.5 MG tablet TAKE 1 TAB BY MOUTH EVERY MORNING AND HALF A TAB EVERY NIGHT 45 tablet 0   montelukast (SINGULAIR) 10 MG tablet TAKE 1 TABLET BY MOUTH EVERYDAY AT BEDTIME 90 tablet 3   propranolol (INDERAL) 60 MG tablet TAKE 1 TABLET (60 MG TOTAL) BY MOUTH 2 (TWO) TIMES DAILY. 180 tablet 2   trihexyphenidyl (ARTANE) 2 MG tablet TAKE 1 TABLET (2 MG TOTAL) BY MOUTH AT BEDTIME. 90 tablet 0   valACYclovir (VALTREX) 1000 MG tablet Take 2 tabs every 12 hours x 1 day as needed for cold sores 20 tablet 0   vitamin B-12 (CYANOCOBALAMIN) 1000 MCG tablet Take 1,000 mcg by mouth daily.     No current facility-administered medications on file prior to visit.     There are no Patient Instructions on file for this visit. No follow-ups on file.   Kris Hartmann, NP  This note was completed with Sales executive.  Any errors are purely unintentional.

## 2018-08-15 ENCOUNTER — Ambulatory Visit: Payer: Medicare HMO | Admitting: Gastroenterology

## 2018-08-21 ENCOUNTER — Telehealth: Payer: Self-pay | Admitting: Neurology

## 2018-08-21 ENCOUNTER — Other Ambulatory Visit: Payer: Self-pay | Admitting: Neurology

## 2018-08-21 NOTE — Telephone Encounter (Signed)
Patient is scheduled to see Dr. Carles Collet on 09/22/2018 for a follow up. She was needing a refill on her Clonazepam medication but it was denied. She said she doesn't want to run out and that she really needs it. She uses [facility] on [address]. Please Call. Thanks

## 2018-08-21 NOTE — Telephone Encounter (Signed)
Needs appt for refill.  Haven't seen since august and is controlled substance.  Can make evisit if she would like.

## 2018-08-22 ENCOUNTER — Other Ambulatory Visit: Payer: Self-pay | Admitting: Neurology

## 2018-08-23 NOTE — Progress Notes (Signed)
Virtual Visit via Video Note The purpose of this virtual visit is to provide medical care while limiting exposure to the novel coronavirus.    Consent was obtained for video visit:  Yes.   Answered questions that patient had about telehealth interaction:  Yes.   I discussed the limitations, risks, security and privacy concerns of performing an evaluation and management service by telemedicine. I also discussed with the patient that there may be a patient responsible charge related to this service. The patient expressed understanding and agreed to proceed.  Pt location: Home Physician Location: office Name of referring provider:  Jinny Sanders, MD I connected with Michelle Donovan at patients initiation/request on 08/24/2018 at  2:00 PM EDT by video enabled telemedicine application and verified that I am speaking with the correct person using two identifiers. Pt MRN:  [id number] Pt DOB:  1945-07-20 Video Participants:  Michelle Donovan;     History of Present Illness:  Patient is seen today in follow-up for tremor.  She is on propranolol, 60 mg twice per day, trihexyphenidyl, 2 mg once per day (was twice) and clonazepam, 0.5 mg, 1 tablet in the morning and half tablet at night.  She denies falls.  She denies hallucinations.  She does state that she has been having pain and swelling in the left foot and ankle.  She had an ultrasound and saw a vascular NP and was advised to see a neurologist.  I did review those records.  She states that she had an area around the ankle that was biopsied many years ago as a possible cancer and was told that it was not a cancer, but there was evidence of a spider bite in that location, perhaps a brown recluse had bit her.  Ever since then, she has had some pain in that area.  She states that the pain has gotten somewhat worse.  It is located on the inside of her left ankle.  She will sometimes have pain in the small/pinky toes and the adjacent 2 toes as well.   She describes that pain is throbbing.  The longer she is on her feet the worse it is.  She does state that the swelling has gotten somewhat better because she has been at home because of the pandemic and has been elevating her feet.  She does state that there is no pain in her back and this pain is not shooting from her back.   Observations/Objective:   Vitals:   08/24/18 1325  Weight: 159 lb (72.1 kg)  Height: 5\' 5"  (1.651 m)   GEN:  The patient appears stated age and is in NAD.  Neurological examination:  Orientation: The patient is alert and oriented x3. Cranial nerves: There is good facial symmetry. There is no facial hypomimia.  The speech is fluent and clear. Soft palate rises symmetrically and there is no tongue deviation. Hearing is intact to conversational tone. Motor: Strength is at least antigravity x 4.   Shoulder shrug is equal and symmetric.  There is no pronator drift.  Movement examination: Tone: unable Abnormal movements: No rest tremor.  No postural tremor.  She does have some intention tremor when the hands are held very close to the face (she has run out of her clonazepam). Coordination:  There is no decremation with RAM's, with hand opening and closing her finger taps bilaterally. Gait and Station: The patient has no difficulty arising out of a deep-seated chair without the use of the hands.  The patient's stride length is good.      Assessment and Plan:   1.  Essential Tremor.             --She finally feels that she has good control on her essential tremor and really does not want to change medications.  I talked to her about the fact that she has had intermittent bradycardia, which is likely due to her Inderal LA.  She insists that she has had no signs or symptoms of this and really does not want to change it.  She states that it took a long time for her tremor to be well controlled and would like to not make changes.  She will certainly let me know if she has any  lightheadedness or near syncopal feelings.  For now, she opted to remain on propranolol, 60 mg twice per day.  She has decreased her trihexyphenidyl from 2 mg twice per day to 2 mg once per day.  She does not need a refill of that.  She will continue on clonazepam 0.5 mg, 1 tablet in the morning and half a tablet at night.    I did refill that today.  -Discussed in detail Covid-19 and risk factors for this, including age .  Discussed importance of social distancing.  Discussed importance of staying home at all times, as is feasible.  Discussed taking advantage of grocery store hours for the elderly.  Pt expressed understanding. 2.  Mild B12 deficiency.             -back on b12 supplement 3.  Depression             -no SI/HI 4.  Non alcoholic fatty liver disease             -due to consumption of fast foods/poor diet .  Liver function studies are markedly improved with improvement of diet and discontinuation of sodas. 5.     Ankle pain  -I really do not think that this is neurologic in nature.  The nurse practitioner that saw her from vascular surgery told her she needed a neurologist.  I am not convinced, but I really could not do an adequate examination either.  I did offer her EMG testing, but she deferred on that.  She stated that she will think about it.  She will let me know if she changes her mind and we can schedule that once the pandemic is over and we offered that testing again.  Follow Up Instructions:    -I discussed the assessment and treatment plan with the patient. The patient was provided an opportunity to ask questions and all were answered. The patient agreed with the plan and demonstrated an understanding of the instructions.   The patient was advised to call back or seek an in-person evaluation if the symptoms worsen or if the condition fails to improve as anticipated.    Total Time spent in visit with the patient was:  20 min, of which more than 50% of the time was spent in  counseling and/or coordinating care on safety.   Pt understands and agrees with the plan of care outlined.     Alonza Bogus, DO

## 2018-08-23 NOTE — Telephone Encounter (Signed)
Needs evisit.  Is controlled substance.  I actually denied this yesterday for same reason.  Must be seen at least every 6 months.

## 2018-08-24 ENCOUNTER — Telehealth (INDEPENDENT_AMBULATORY_CARE_PROVIDER_SITE_OTHER): Payer: Medicare HMO | Admitting: Neurology

## 2018-08-24 ENCOUNTER — Encounter: Payer: Self-pay | Admitting: Neurology

## 2018-08-24 ENCOUNTER — Other Ambulatory Visit: Payer: Self-pay

## 2018-08-24 DIAGNOSIS — M25572 Pain in left ankle and joints of left foot: Secondary | ICD-10-CM

## 2018-08-24 DIAGNOSIS — G250 Essential tremor: Secondary | ICD-10-CM

## 2018-08-24 MED ORDER — CLONAZEPAM 0.5 MG PO TABS: ORAL_TABLET | ORAL | 1 refills | Status: DC

## 2018-09-14 ENCOUNTER — Encounter: Payer: Self-pay | Admitting: Family Medicine

## 2018-09-14 ENCOUNTER — Ambulatory Visit (INDEPENDENT_AMBULATORY_CARE_PROVIDER_SITE_OTHER): Payer: Medicare HMO | Admitting: Family Medicine

## 2018-09-14 DIAGNOSIS — H938X1 Other specified disorders of right ear: Secondary | ICD-10-CM | POA: Insufficient documentation

## 2018-09-14 DIAGNOSIS — M542 Cervicalgia: Secondary | ICD-10-CM | POA: Diagnosis not present

## 2018-09-14 NOTE — Telephone Encounter (Signed)
See MyChart note. Please call this pt and offer a virtual visit... if I cannot handle over the phone we may be able to transition to an in person OV.

## 2018-09-14 NOTE — Assessment & Plan Note (Signed)
Most likely due to fluid behind eardrum for allergies.  No clear suggestion of ear infection.  Pain in neck likely referred from ear or due to mild cervical lymphadenopathy.  Will treat with nasal steroid mucolyticand nasal saline.  If not improving early next week have low threshold for wither exam or empiric treatment for ear infection with antibiotics.

## 2018-09-14 NOTE — Patient Instructions (Signed)
Start flonase 2 sprays per nostril daily. Can use ibuprofen as needed.  Could add mucinex and or nasal saline irrigation prn.  Call early next week if ear [pain, fever or symptoms not imrpovign for consideration of antibitocis for developing ear infection.

## 2018-09-14 NOTE — Progress Notes (Signed)
VIRTUAL VISIT Due to national recommendations of social distancing due to [place] 19, a virtual visit is felt to be most appropriate for this patient at this time.   I connected with the patient on 09/14/18 at  2:00 PM EDT by virtual telehealth platform and verified that I am speaking with the correct person using two identifiers.   I discussed the limitations, risks, security and privacy concerns of performing an evaluation and management service by  virtual telehealth platform and the availability of in person appointments. I also discussed with the patient that there may be a patient responsible charge related to this service. The patient expressed understanding and agreed to proceed.  Patient location: Home Provider Location: [facility] Participants: Eliezer Lofts and [name]   Chief Complaint  Patient presents with  . Neck Pain    Shoots up to back of right ear lobe    History of Present Illness: 73 year old female with history of DM, HTN, essential tremor, adhesive capsulitis of shoulder and lumbar bulging disc presents with new onset pain in neck, and right ear fukllness  1 week ago she noted fullness in right ear... few days later note sharp pain  In anterior right side of neck and radiate up to behind ear lobe.  Not constant pain.Marland Kitchen intermittant last 30 min.  Mild swelling. No ear pain. No allergy symptoms, no cough, no ST.  No fever. No erash. No pain in posterior neck or on left. No radiation of pain to arm. No change in pain with head movement or arm movement.  no fall. No arm numbness or weakness.  She has tried ibuprofen 400 mg twice daily.. improves temporarily.  On Singulair and zyrtec for allergies... as needed.   COVID 19 screen No recent travel or known exposure to COVID19 The patient denies respiratory symptoms of COVID 19 at this time.  The importance of social distancing was discussed today.   Review of Systems  Constitutional:  Negative for chills and fever.  HENT: Negative for congestion, ear discharge, ear pain, hearing loss, sinus pain, sore throat and tinnitus.   Eyes: Negative for pain and redness.  Respiratory: Negative for cough and shortness of breath.   Cardiovascular: Negative for chest pain, palpitations and leg swelling.  Gastrointestinal: Negative for abdominal pain, blood in stool, constipation, diarrhea, nausea and vomiting.  Genitourinary: Negative for dysuria.  Musculoskeletal: Negative for falls and myalgias.  Skin: Negative for rash.  Neurological: Negative for dizziness.  Psychiatric/Behavioral: Negative for depression. The patient is not nervous/anxious.       Past Medical History:  Diagnosis Date  . Allergic rhinitis, cause unspecified   . Asthma    not bad per pt  . Dermatophytosis of scalp and beard   . Diabetes mellitus without complication ([place])    history of no longer a problem  . Diverticulosis   . Essential and other specified forms of tremor   . Fatty liver   . HTN (hypertension)   . Irritable bowel syndrome   . Lumbago    bulging disk per MRI   . Mixed hyperlipidemia   . Other acute reactions to stress   . Other diseases of lung, not elsewhere classified    solitary pulm. nodule(left)  . PONV (postoperative nausea and vomiting)   . Thyroid nodule   . Unspecified asthma(493.90)   . Unspecified vitamin D deficiency     reports that she quit smoking about 38 years ago. Her smoking use included cigarettes.  She has never used smokeless tobacco. She reports that she does not drink alcohol or use drugs.   Current Outpatient Medications:  .  cetirizine (ZYRTEC) 10 MG tablet, Take 10 mg by mouth daily., Disp: , Rfl:  .  clonazePAM (KLONOPIN) 0.5 MG tablet, TAKE 1 TAB BY MOUTH EVERY MORNING AND HALF A TAB EVERY NIGHT, Disp: 135 tablet, Rfl: 1 .  montelukast (SINGULAIR) 10 MG tablet, TAKE 1 TABLET BY MOUTH EVERYDAY AT BEDTIME, Disp: 90 tablet, Rfl: 3 .  propranolol (INDERAL) 60  MG tablet, TAKE 1 TABLET (60 MG TOTAL) BY MOUTH 2 (TWO) TIMES DAILY., Disp: 180 tablet, Rfl: 2 .  trihexyphenidyl (ARTANE) 2 MG tablet, Take 1 mg by mouth at bedtime., Disp: , Rfl:  .  valACYclovir (VALTREX) 1000 MG tablet, Take 2 tabs every 12 hours x 1 day as needed for cold sores, Disp: 20 tablet, Rfl: 0 .  vitamin B-12 (CYANOCOBALAMIN) 1000 MCG tablet, Take 1,000 mcg by mouth daily., Disp: , Rfl:    Observations/Objective: Height 5' 5.25" (1.657 m).  Physical Exam  Physical Exam Constitutional:      General: She is not in acute distress. Pulmonary:     Effort: Pulmonary effort is normal. No respiratory distress.  Neurological:     Mental Status: She is alert and oriented to person, place, and time.  Psychiatric:        Mood and Affect: Mood normal.        Behavior: Behavior normal.   Assessment and Plan. Ear fullness, right Most likely due to fluid behind eardrum for allergies.  No clear suggestion of ear infection.  Pain in neck likely referred from ear or due to mild cervical lymphadenopathy.  Will treat with nasal steroid mucolyticand nasal saline.  If not improving early next week have low threshold for wither exam or empiric treatment for ear infection with antibiotics.   Acute neck pain Liekly referred from ear... no clear cervical assocaition. No red flags.     I discussed the assessment and treatment plan with the patient. The patient was provided an opportunity to ask questions and all were answered. The patient agreed with the plan and demonstrated an understanding of the instructions.   The patient was advised to call back or seek an in-person evaluation if the symptoms worsen or if the condition fails to improve as anticipated.     Eliezer Lofts, MD

## 2018-09-14 NOTE — Assessment & Plan Note (Signed)
Liekly referred from ear... no clear cervical assocaition. No red flags.

## 2018-09-20 ENCOUNTER — Telehealth: Payer: Self-pay | Admitting: *Deleted

## 2018-09-20 NOTE — Telephone Encounter (Signed)
I am calling to see if you would like to schedule your surgery with Dr. Milinda Pointer.  "I can't right now due to work.  I wish I could but I have some problems with my budget right now."  I understand, just give [place] a call when you are ready.  "That's not going to be a problem is it?"  It is not a problem at all.  We understand your circumstances.  "So the surgical centers have opened back up?"  Yes, they have re-opened.  "That's good to know."

## 2018-09-22 ENCOUNTER — Ambulatory Visit: Payer: Medicare HMO | Admitting: Neurology

## 2018-09-22 ENCOUNTER — Encounter

## 2018-10-18 ENCOUNTER — Encounter: Payer: Self-pay | Admitting: Podiatry

## 2018-10-18 ENCOUNTER — Ambulatory Visit (INDEPENDENT_AMBULATORY_CARE_PROVIDER_SITE_OTHER): Payer: Medicare HMO

## 2018-10-18 ENCOUNTER — Other Ambulatory Visit: Payer: Self-pay | Admitting: Podiatry

## 2018-10-18 ENCOUNTER — Other Ambulatory Visit: Payer: Self-pay

## 2018-10-18 ENCOUNTER — Ambulatory Visit: Payer: Medicare HMO | Admitting: Podiatry

## 2018-10-18 VITALS — Temp 98.3°F

## 2018-10-18 DIAGNOSIS — I872 Venous insufficiency (chronic) (peripheral): Secondary | ICD-10-CM | POA: Diagnosis not present

## 2018-10-18 DIAGNOSIS — M779 Enthesopathy, unspecified: Secondary | ICD-10-CM

## 2018-10-18 DIAGNOSIS — M778 Other enthesopathies, not elsewhere classified: Secondary | ICD-10-CM

## 2018-10-18 DIAGNOSIS — G5762 Lesion of plantar nerve, left lower limb: Secondary | ICD-10-CM

## 2018-10-18 NOTE — Progress Notes (Signed)
She presents today with constant foot and ankle swelling in her left foot.  States is been constant for quite some time now she went to the blood vessel doctor and was told that she had venous insufficiency but she wants to know why her foot hurts.  Objective: Vital signs are stable she is alert and oriented x3 pulses are palpable left.  Pitting edema is noted to the left foot and leg.  Radiographs taken today demonstrate swelling and soft tissue edema throughout the leg and the foot.  Reviewed vascular evaluation stating venous insufficiency left femoral vein  Assessment: Venous insufficiency.  Plan: Discussed etiology pathology and surgical therapies at this point we will go to make an appointment to follow-up with Dr. Lucky Cowboy since she never spoke to vascular surgeon since the procedure.

## 2018-11-29 ENCOUNTER — Other Ambulatory Visit: Payer: Self-pay | Admitting: Neurology

## 2018-11-29 DIAGNOSIS — R69 Illness, unspecified: Secondary | ICD-10-CM | POA: Diagnosis not present

## 2018-11-29 NOTE — Telephone Encounter (Signed)
Requested Prescriptions   Pending Prescriptions Disp Refills  . trihexyphenidyl (ARTANE) 2 MG tablet [Pharmacy Med Name: TRIHEXYPHENIDYL 2 MG TABLET] 30 tablet 2    Sig: TAKE 1 TABLET (2 MG TOTAL) BY MOUTH AT BEDTIME.   Rx last filled: 07/21/18 #90 0 REFILLS  Pt last seen:08/24/18 Assessment and Plan:   1. Essential Tremor. --She finally feels that she has good control on her essential tremor and really does not want to change medications. I talked to her about the fact that she has had intermittent bradycardia, which is likely due to her Inderal LA. She insists that she has had no signs or symptoms of this and really does not want to change it. She states that it took a long time for her tremor to be well controlled and would like to not make changes. She will certainly let me know if she has any lightheadedness or near syncopal feelings. For now, she opted to remain onpropranolol, 60 mg twice per day. She has decreased her trihexyphenidyl from 2 mg twice per day to 2 mg once per day.  She does not need a refill of that.  She will continue on clonazepam 0.5 mg, 1 tablet in the morning and half a tablet at night.   I did refill that today.             -Discussed in detail Covid-19 and risk factors for this, including age .  Discussed importance of social distancing.  Discussed importance of staying home at all times, as is feasible.  Discussed taking advantage of grocery store hours for the elderly.  Pt expressed understanding.    Follow up appt scheduled:02/26/19

## 2018-12-22 ENCOUNTER — Encounter: Payer: Self-pay | Admitting: Family Medicine

## 2018-12-22 ENCOUNTER — Other Ambulatory Visit: Payer: Self-pay

## 2018-12-22 ENCOUNTER — Ambulatory Visit (INDEPENDENT_AMBULATORY_CARE_PROVIDER_SITE_OTHER): Payer: Medicare HMO | Admitting: Family Medicine

## 2018-12-22 VITALS — Ht 65.25 in

## 2018-12-22 DIAGNOSIS — R5383 Other fatigue: Secondary | ICD-10-CM | POA: Diagnosis not present

## 2018-12-22 NOTE — Progress Notes (Signed)
VIRTUAL VISIT Due to national recommendations of social distancing due to [place] 19, a virtual visit is felt to be most appropriate for this patient at this time.   I connected with the patient on 12/22/18 at 11:00 AM EDT by virtual telehealth platform and verified that I am speaking with the correct person using two identifiers.   I discussed the limitations, risks, security and privacy concerns of performing an evaluation and management service by  virtual telehealth platform and the availability of in person appointments. I also discussed with the patient that there may be a patient responsible charge related to this service. The patient expressed understanding and agreed to proceed.  Patient location: Home Provider Location: [facility] Participants: Michelle Donovan and [name]   Chief Complaint  Patient presents with  . Sleepiness during the day    History of Present Illness:  73 year old female presents for new onset sleepiness in daytime.  She reports she has noted several months of excessive daytime sleepiness. When she wakes up in AM she feels okay, but gets fatigue very easily.  She has been falling asleep during the day very easily.  Sleeping 7 hours a night. Normal for her. No frequent waking,  Not sure if she snore.. no apnea spells.  no depression.  No CP, no SOB. No AM headache.  No new meds.   She is on artane, clonazepam and propranolol which can cause fatigue, but haven't been cahnged  She has been dealing with left leg pain and swelling.. skin a rea bx as spider bite possible brown recluse.  Saw vascualr.. mild reflux of venous studies, nml ABIs.  saw Dr. Milinda Pointer who referred her back to vascular MD for further eval.  She has noted less swelling but not less pain after elevation, compression hose hurt leg more. COVID 19 screen No recent travel or known exposure to COVID19 The patient denies respiratory symptoms of COVID 19 at this time.  The  importance of social distancing was discussed today.   Review of Systems  Constitutional: Negative for chills and fever.  HENT: Negative for congestion and ear pain.   Eyes: Negative for pain and redness.  Respiratory: Negative for cough and shortness of breath.   Cardiovascular: Negative for chest pain, palpitations and leg swelling.  Gastrointestinal: Negative for abdominal pain, blood in stool, constipation, diarrhea, nausea and vomiting.  Genitourinary: Negative for dysuria.  Musculoskeletal: Negative for falls and myalgias.  Skin: Negative for rash.  Neurological: Negative for dizziness.  Psychiatric/Behavioral: Negative for depression. The patient is not nervous/anxious.       Past Medical History:  Diagnosis Date  . Allergic rhinitis, cause unspecified   . Asthma    not bad per pt  . Dermatophytosis of scalp and beard   . Diabetes mellitus without complication ([place])    history of no longer a problem  . Diverticulosis   . Essential and other specified forms of tremor   . Fatty liver   . HTN (hypertension)   . Irritable bowel syndrome   . Lumbago    bulging disk per MRI   . Mixed hyperlipidemia   . Other acute reactions to stress   . Other diseases of lung, not elsewhere classified    solitary pulm. nodule(left)  . PONV (postoperative nausea and vomiting)   . Thyroid nodule   . Unspecified asthma(493.90)   . Unspecified vitamin D deficiency     reports that she quit smoking about 38 years ago.  Her smoking use included cigarettes. She has never used smokeless tobacco. She reports that she does not drink alcohol or use drugs.   Current Outpatient Medications:  .  cetirizine (ZYRTEC) 10 MG tablet, Take 10 mg by mouth daily., Disp: , Rfl:  .  clonazePAM (KLONOPIN) 0.5 MG tablet, TAKE 1 TAB BY MOUTH EVERY MORNING AND HALF A TAB EVERY NIGHT, Disp: 135 tablet, Rfl: 1 .  montelukast (SINGULAIR) 10 MG tablet, TAKE 1 TABLET BY MOUTH EVERYDAY AT BEDTIME, Disp: 90 tablet, Rfl:  3 .  propranolol (INDERAL) 60 MG tablet, TAKE 1 TABLET (60 MG TOTAL) BY MOUTH 2 (TWO) TIMES DAILY., Disp: 180 tablet, Rfl: 2 .  trihexyphenidyl (ARTANE) 2 MG tablet, TAKE 1 TABLET (2 MG TOTAL) BY MOUTH AT BEDTIME., Disp: 90 tablet, Rfl: 1 .  valACYclovir (VALTREX) 1000 MG tablet, Take 2 tabs every 12 hours x 1 day as needed for cold sores, Disp: 20 tablet, Rfl: 0 .  vitamin B-12 (CYANOCOBALAMIN) 1000 MCG tablet, Take 1,000 mcg by mouth daily., Disp: , Rfl:    Observations/Objective: Height 5' 5.25" (1.657 m).  Physical Exam  Physical Exam Constitutional:      General: The patient is not in acute distress. Pulmonary:     Effort: Pulmonary effort is normal. No respiratory distress.  Neurological:     Mental Status: The patient is alert and oriented to person, place, and time.  Psychiatric:        Mood and Affect: Mood normal.        Behavior: Behavior normal.   Assessment and Plan    I discussed the assessment and treatment plan with the patient. The patient was provided an opportunity to ask questions and all were answered. The patient agreed with the plan and demonstrated an understanding of the instructions.   The patient was advised to call back or seek an in-person evaluation if the symptoms worsen or if the condition fails to improve as anticipated.     Michelle Lofts, MD

## 2018-12-25 ENCOUNTER — Other Ambulatory Visit (INDEPENDENT_AMBULATORY_CARE_PROVIDER_SITE_OTHER): Payer: Medicare HMO

## 2018-12-25 DIAGNOSIS — R5383 Other fatigue: Secondary | ICD-10-CM | POA: Diagnosis not present

## 2018-12-25 LAB — CBC WITH DIFFERENTIAL/PLATELET
Basophils Absolute: 0 10*3/uL (ref 0.0–0.1)
Basophils Relative: 0.6 % (ref 0.0–3.0)
Eosinophils Absolute: 0.1 10*3/uL (ref 0.0–0.7)
Eosinophils Relative: 1.8 % (ref 0.0–5.0)
HCT: 39 % (ref 36.0–46.0)
Hemoglobin: 12.6 g/dL (ref 12.0–15.0)
Lymphocytes Relative: 32.7 % (ref 12.0–46.0)
Lymphs Abs: 2.2 10*3/uL (ref 0.7–4.0)
MCHC: 32.2 g/dL (ref 30.0–36.0)
MCV: 88.6 fl (ref 78.0–100.0)
Monocytes Absolute: 0.4 10*3/uL (ref 0.1–1.0)
Monocytes Relative: 6.4 % (ref 3.0–12.0)
Neutro Abs: 3.8 10*3/uL (ref 1.4–7.7)
Neutrophils Relative %: 58.5 % (ref 43.0–77.0)
Platelets: 195 10*3/uL (ref 150.0–400.0)
RBC: 4.4 Mil/uL (ref 3.87–5.11)
RDW: 13.3 % (ref 11.5–15.5)
WBC: 6.6 10*3/uL (ref 4.0–10.5)

## 2018-12-25 LAB — COMPREHENSIVE METABOLIC PANEL
ALT: 42 U/L — ABNORMAL HIGH (ref 0–35)
AST: 32 U/L (ref 0–37)
Albumin: 3.8 g/dL (ref 3.5–5.2)
Alkaline Phosphatase: 109 U/L (ref 39–117)
BUN: 13 mg/dL (ref 6–23)
CO2: 26 mEq/L (ref 19–32)
Calcium: 9.2 mg/dL (ref 8.4–10.5)
Chloride: 106 mEq/L (ref 96–112)
Creatinine, Ser: 0.73 mg/dL (ref 0.40–1.20)
GFR: 78.59 mL/min (ref >60.00)
Glucose, Bld: 104 mg/dL — ABNORMAL HIGH (ref 70–99)
Potassium: 4.5 mEq/L (ref 3.5–5.1)
Sodium: 140 mEq/L (ref 135–145)
Total Bilirubin: 0.3 mg/dL (ref 0.2–1.2)
Total Protein: 6.5 g/dL (ref 6.0–8.3)

## 2018-12-25 LAB — T4, FREE: Free T4: 0.99 ng/dL (ref 0.60–1.60)

## 2018-12-25 LAB — HEMOGLOBIN A1C: Hgb A1c MFr Bld: 6.1 % (ref 4.6–6.5)

## 2018-12-25 LAB — VITAMIN B12: Vitamin B-12: 444 pg/mL (ref 211–911)

## 2018-12-25 LAB — TSH: TSH: 0.95 u[IU]/mL (ref 0.35–4.50)

## 2018-12-25 LAB — VITAMIN D 25 HYDROXY (VIT D DEFICIENCY, FRACTURES): VITD: 33.59 ng/mL (ref 30.00–100.00)

## 2018-12-25 LAB — T3, FREE: T3, Free: 2.6 pg/mL (ref 2.3–4.2)

## 2019-01-11 ENCOUNTER — Other Ambulatory Visit: Payer: Self-pay

## 2019-01-11 ENCOUNTER — Ambulatory Visit: Payer: Medicare HMO | Admitting: Gastroenterology

## 2019-01-11 VITALS — BP 124/67 | HR 79 | Temp 98.1°F | Ht 65.0 in | Wt 157.4 lb

## 2019-01-11 DIAGNOSIS — K5900 Constipation, unspecified: Secondary | ICD-10-CM

## 2019-01-11 NOTE — Patient Instructions (Signed)

## 2019-01-11 NOTE — Progress Notes (Signed)
Jonathon Bellows MD, MRCP(U.K) [place]  [place], [place] [address]  Main: [phone]  Fax: [phone]   Primary Care Physician: Jinny Sanders, MD  Primary Gastroenterologist:  Dr. Jonathon Bellows   Constipation and hemorroids   HPI: Dreya Krouse is a 73 y.o. female   Lyrika Adornetto a 73 y.o.femaleis here today to see me for constipation and issues with hemorrhoids.  She has previously been seen for dysphagia and abnormal liver functions tests secondary toNAFLD.  She also has GERD.   Summary of history : She was initially seen on 09/14/16 for abnormal LFT's .RUQ USG 07/2016 showed fatty infiltration of the liver .Autoimmune work up negative .Not immune to hepatitis B Labs 02/2017 - LFt-normal . B12 normal .Low vitamin D, repeat F actin and AMA-negative.  EGD 02/2017 - esophageal bx shows features of GERD and esophagitis. 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  Ongoing for a few months.  She says that she has a bowel movement every few days and very often is very watery.  Denies any blood in the stool.  Last colonoscopy was 14 years back.  Not on any blood thinners.  Diet is poor in fiber.  Not taking any laxatives.    Current Outpatient Medications  Medication Sig Dispense Refill  . cetirizine (ZYRTEC) 10 MG tablet Take 10 mg by mouth daily.    . clonazePAM (KLONOPIN) 0.5 MG tablet TAKE 1 TAB BY MOUTH EVERY MORNING AND HALF A TAB EVERY NIGHT 135 tablet 1  . montelukast (SINGULAIR) 10 MG tablet TAKE 1 TABLET BY MOUTH EVERYDAY AT BEDTIME 90 tablet 3  . propranolol (INDERAL) 60 MG tablet TAKE 1 TABLET (60 MG TOTAL) BY MOUTH 2 (TWO) TIMES DAILY. 180 tablet 2  . trihexyphenidyl (ARTANE) 2 MG tablet TAKE 1 TABLET (2 MG TOTAL) BY MOUTH AT BEDTIME. 90 tablet 1  . valACYclovir (VALTREX) 1000 MG tablet Take 2 tabs  every 12 hours x 1 day as needed for cold sores 20 tablet 0  . vitamin B-12 (CYANOCOBALAMIN) 1000 MCG tablet Take 1,000 mcg by mouth daily.     No current facility-administered medications for this visit.     Allergies as of 01/11/2019 - Review Complete 12/22/2018  Allergen Reaction Noted  . Tylenol [acetaminophen] Other (See Comments) 08/16/2016  . Codeine Nausea And Vomiting 02/06/2007  . Topamax [topiramate] Other (See Comments) 04/08/2015    ROS:  General: Negative for anorexia, weight loss, fever, chills, fatigue, weakness. ENT: Negative for hoarseness, difficulty swallowing , nasal congestion. CV: Negative for chest pain, angina, palpitations, dyspnea on exertion, peripheral edema.  Respiratory: Negative for dyspnea at rest, dyspnea on exertion, cough, sputum, wheezing.  GI: See history of present illness. GU:  Negative for dysuria, hematuria, urinary incontinence, urinary frequency, nocturnal urination.  Endo: Negative for unusual weight change.    Physical Examination:   There were no vitals taken for this visit.  General: Well-nourished, well-developed in no acute distress.  Eyes: No icterus. Conjunctivae pink. Mouth: Oropharyngeal mucosa moist and pink , no lesions erythema or exudate. Lungs: Clear to auscultation bilaterally. Non-labored. Heart: Regular rate and rhythm, no murmurs rubs or gallops.  Abdomen: Bowel sounds are normal, nontender, nondistended, no hepatosplenomegaly or masses, no abdominal bruits or hernia , no rebound or guarding.   Extremities: No lower extremity edema. No clubbing  or deformities. Neuro: Alert and oriented x 3.  Grossly intact. Skin: Warm and dry, no jaundice.   Psych: Alert and cooperative, normal mood and affect.   Imaging Studies: No results found.  Assessment and Plan:   Kambrya Toal is a 73 y.o. y/o female  here to see me for constipation and issues with hemorrhoids.  We discussed options including medications but she  would like to try conservative management for constipation.  We discussed in depth to try and increase her dietary fiber with a target of 25 to 30 g.  I have provided her a list of common foods in the fiber content and gave her a list of foods which she could incorporate into her diet to reach the target goal.  I explained to her if she reaches a target goal often patients do not have further issues with constipation.  I also explained to her that hemorrhoids could bleed from constipation and the first line of treatment would be to increase dietary fiber.  Since she has not had a colonoscopy for over 14 years I suggested it would be a good idea to get one at this point of time due to recent change in bowel movements.   I have discussed alternative options, risks & benefits,  which include, but are not limited to, bleeding, infection, perforation,respiratory complication & drug reaction.  The patient agrees with this plan & written consent will be obtained.     Dr Jonathon Bellows  MD,MRCP [facility]) Follow up in as needed

## 2019-01-20 NOTE — Assessment & Plan Note (Signed)
May be secondary to medication SE.  Work on conditioning. Eval with labs.

## 2019-01-23 ENCOUNTER — Other Ambulatory Visit: Payer: Self-pay

## 2019-01-23 ENCOUNTER — Other Ambulatory Visit
Admission: RE | Admit: 2019-01-23 | Discharge: 2019-01-23 | Disposition: A | Discharge status: Home / Self Care | Payer: Medicare HMO | Source: Ambulatory Visit | Attending: Gastroenterology | Admitting: Gastroenterology

## 2019-01-23 DIAGNOSIS — Z01812 Encounter for preprocedural laboratory examination: Secondary | ICD-10-CM | POA: Diagnosis not present

## 2019-01-23 DIAGNOSIS — Z20828 Contact with and (suspected) exposure to other viral communicable diseases: Secondary | ICD-10-CM | POA: Insufficient documentation

## 2019-01-23 LAB — SARS CORONAVIRUS 2 (TAT 6-24 HRS): SARS Coronavirus 2: NEGATIVE

## 2019-01-25 ENCOUNTER — Encounter: Payer: Self-pay | Admitting: *Deleted

## 2019-01-26 ENCOUNTER — Other Ambulatory Visit: Payer: Self-pay

## 2019-01-26 ENCOUNTER — Encounter
Admission: RE | Disposition: A | Discharge status: Home / Self Care | Payer: Self-pay | Source: Home / Self Care | Attending: Gastroenterology

## 2019-01-26 ENCOUNTER — Ambulatory Visit: Payer: Medicare HMO | Admitting: Certified Registered Nurse Anesthetist

## 2019-01-26 ENCOUNTER — Ambulatory Visit
Admission: RE | Admit: 2019-01-26 | Discharge: 2019-01-26 | Disposition: A | Discharge status: Home / Self Care | Payer: Medicare HMO | Attending: Gastroenterology | Admitting: Gastroenterology

## 2019-01-26 DIAGNOSIS — Z823 Family history of stroke: Secondary | ICD-10-CM | POA: Insufficient documentation

## 2019-01-26 DIAGNOSIS — Z79899 Other long term (current) drug therapy: Secondary | ICD-10-CM | POA: Insufficient documentation

## 2019-01-26 DIAGNOSIS — Z90710 Acquired absence of both cervix and uterus: Secondary | ICD-10-CM | POA: Diagnosis not present

## 2019-01-26 DIAGNOSIS — K635 Polyp of colon: Secondary | ICD-10-CM

## 2019-01-26 DIAGNOSIS — Z885 Allergy status to narcotic agent status: Secondary | ICD-10-CM | POA: Diagnosis not present

## 2019-01-26 DIAGNOSIS — K760 Fatty (change of) liver, not elsewhere classified: Secondary | ICD-10-CM | POA: Diagnosis not present

## 2019-01-26 DIAGNOSIS — Z820 Family history of epilepsy and other diseases of the nervous system: Secondary | ICD-10-CM | POA: Insufficient documentation

## 2019-01-26 DIAGNOSIS — D123 Benign neoplasm of transverse colon: Secondary | ICD-10-CM | POA: Diagnosis not present

## 2019-01-26 DIAGNOSIS — Z8379 Family history of other diseases of the digestive system: Secondary | ICD-10-CM | POA: Diagnosis not present

## 2019-01-26 DIAGNOSIS — K589 Irritable bowel syndrome without diarrhea: Secondary | ICD-10-CM | POA: Diagnosis not present

## 2019-01-26 DIAGNOSIS — K5730 Diverticulosis of large intestine without perforation or abscess without bleeding: Secondary | ICD-10-CM | POA: Diagnosis not present

## 2019-01-26 DIAGNOSIS — Z9049 Acquired absence of other specified parts of digestive tract: Secondary | ICD-10-CM | POA: Diagnosis not present

## 2019-01-26 DIAGNOSIS — J45909 Unspecified asthma, uncomplicated: Secondary | ICD-10-CM | POA: Insufficient documentation

## 2019-01-26 DIAGNOSIS — Z8249 Family history of ischemic heart disease and other diseases of the circulatory system: Secondary | ICD-10-CM | POA: Insufficient documentation

## 2019-01-26 DIAGNOSIS — K5900 Constipation, unspecified: Secondary | ICD-10-CM | POA: Diagnosis not present

## 2019-01-26 DIAGNOSIS — D122 Benign neoplasm of ascending colon: Secondary | ICD-10-CM | POA: Insufficient documentation

## 2019-01-26 DIAGNOSIS — R194 Change in bowel habit: Secondary | ICD-10-CM | POA: Diagnosis not present

## 2019-01-26 DIAGNOSIS — Z886 Allergy status to analgesic agent status: Secondary | ICD-10-CM | POA: Insufficient documentation

## 2019-01-26 DIAGNOSIS — Z803 Family history of malignant neoplasm of breast: Secondary | ICD-10-CM | POA: Insufficient documentation

## 2019-01-26 DIAGNOSIS — Z888 Allergy status to other drugs, medicaments and biological substances status: Secondary | ICD-10-CM | POA: Insufficient documentation

## 2019-01-26 DIAGNOSIS — I10 Essential (primary) hypertension: Secondary | ICD-10-CM | POA: Diagnosis not present

## 2019-01-26 DIAGNOSIS — R251 Tremor, unspecified: Secondary | ICD-10-CM | POA: Insufficient documentation

## 2019-01-26 DIAGNOSIS — Z800 Family history of malignant neoplasm of digestive organs: Secondary | ICD-10-CM | POA: Insufficient documentation

## 2019-01-26 DIAGNOSIS — M545 Low back pain: Secondary | ICD-10-CM | POA: Insufficient documentation

## 2019-01-26 DIAGNOSIS — Z833 Family history of diabetes mellitus: Secondary | ICD-10-CM | POA: Insufficient documentation

## 2019-01-26 DIAGNOSIS — K219 Gastro-esophageal reflux disease without esophagitis: Secondary | ICD-10-CM | POA: Insufficient documentation

## 2019-01-26 DIAGNOSIS — E782 Mixed hyperlipidemia: Secondary | ICD-10-CM | POA: Diagnosis not present

## 2019-01-26 DIAGNOSIS — F419 Anxiety disorder, unspecified: Secondary | ICD-10-CM | POA: Insufficient documentation

## 2019-01-26 DIAGNOSIS — Z87891 Personal history of nicotine dependence: Secondary | ICD-10-CM | POA: Insufficient documentation

## 2019-01-26 HISTORY — PX: COLONOSCOPY WITH PROPOFOL: SHX5780

## 2019-01-26 HISTORY — DX: Gastro-esophageal reflux disease without esophagitis: K21.9

## 2019-01-26 SURGERY — COLONOSCOPY WITH PROPOFOL
Anesthesia: General

## 2019-01-26 MED ORDER — SODIUM CHLORIDE 0.9 % IV SOLN
INTRAVENOUS | Status: DC
  Administered 2019-01-26: 10:00:00 via INTRAVENOUS

## 2019-01-26 MED ORDER — PROPOFOL 500 MG/50ML IV EMUL
INTRAVENOUS | Status: DC | PRN
  Administered 2019-01-26: 150 ug/kg/min via INTRAVENOUS

## 2019-01-26 MED ORDER — PROPOFOL 10 MG/ML IV BOLUS
INTRAVENOUS | Status: AC
  Filled 2019-01-26: qty 20

## 2019-01-26 MED ORDER — LIDOCAINE HCL (PF) 2 % IJ SOLN
INTRAMUSCULAR | Status: AC
  Filled 2019-01-26: qty 10

## 2019-01-26 MED ORDER — LIDOCAINE HCL (CARDIAC) PF 100 MG/5ML IV SOSY
PREFILLED_SYRINGE | INTRAVENOUS | Status: DC | PRN
  Administered 2019-01-26: 50 mg via INTRATRACHEAL

## 2019-01-26 MED ORDER — PROPOFOL 10 MG/ML IV BOLUS
INTRAVENOUS | Status: DC | PRN
  Administered 2019-01-26: 50 mg via INTRAVENOUS

## 2019-01-26 NOTE — Op Note (Signed)
[facility] Patient Name: Michelle Donovan Procedure Date: 01/26/2019 10:06 AM MRN: [id number] Account #: [id number] Date of Birth: 1946/01/24 Admit Type: Outpatient Age: 73 Room: [facility] ENDO ROOM 4 Gender: Female Note Status: Finalized Procedure:            Colonoscopy Indications:          Change in bowel habits Providers:            Jonathon Bellows MD, MD Medicines:            Monitored Anesthesia Care Complications:        No immediate complications. Procedure:            Pre-Anesthesia Assessment:                       - Prior to the procedure, a History and Physical was                        performed, and patient medications, allergies and                        sensitivities were reviewed. The patient's tolerance of                        previous anesthesia was reviewed.                       - The risks and benefits of the procedure and the                        sedation options and risks were discussed with the                        patient. All questions were answered and informed                        consent was obtained.                       - ASA Grade Assessment: II - A patient with mild                        systemic disease.                       After obtaining informed consent, the colonoscope was                        passed under direct vision. Throughout the procedure,                        the patient's blood pressure, pulse, and oxygen                        saturations were monitored continuously. The                        Colonoscope was introduced through the anus and                        advanced to the the cecum, identified by the  appendiceal orifice. The colonoscopy was performed with                        ease. The patient tolerated the procedure well. The                        quality of the bowel preparation was excellent. Findings:      The perianal and digital rectal examinations were  normal.      Two sessile polyps were found in the transverse colon and ascending       colon. The polyps were 5 to 7 mm in size. These polyps were removed with       a cold snare. Resection and retrieval were complete.      The exam was otherwise without abnormality on direct and retroflexion       views.      Multiple small-mouthed diverticula were found in the sigmoid colon. Impression:           - Two 5 to 7 mm polyps in the transverse colon and in                        the ascending colon, removed with a cold snare.                        Resected and retrieved.                       - The examination was otherwise normal on direct and                        retroflexion views.                       - Diverticulosis in the sigmoid colon. Recommendation:       - Discharge patient to home (with escort).                       - Resume previous diet.                       - Continue present medications.                       - Await pathology results.                       - Repeat colonoscopy for surveillance based on                        pathology results. Procedure Code(s):    --- Professional ---                       [phone], Colonoscopy, flexible; with removal of tumor(s),                        polyp(s), or other lesion(s) by snare technique Diagnosis Code(s):    --- Professional ---                       K63.5, Polyp of colon  K57.30, Diverticulosis of large intestine without                        perforation or abscess without bleeding                       R19.4, Change in bowel habit CPT copyright 2019 American [employer] Association. All rights reserved. The codes documented in this report are preliminary and upon coder review may  be revised to meet current compliance requirements. Jonathon Bellows, MD Jonathon Bellows MD, MD 01/26/2019 10:38:15 AM This report has been signed electronically. Number of Addenda: 0 Note Initiated On: 01/26/2019 10:06 AM Scope  Withdrawal Time: 0 hours 9 minutes 59 seconds  Total Procedure Duration: 0 hours 14 minutes 40 seconds  Estimated Blood Loss: Estimated blood loss: none.      [facility]

## 2019-01-26 NOTE — Anesthesia Preprocedure Evaluation (Signed)
Anesthesia Evaluation  Patient identified by MRN, date of birth, ID band Patient awake    Reviewed: Allergy & Precautions, H&P , NPO status , Patient's Chart, lab work & pertinent test results, reviewed documented beta blocker date and time   History of Anesthesia Complications (+) PONV and history of anesthetic complications  Airway Mallampati: II   Neck ROM: full    Dental  (+) Poor Dentition   Pulmonary neg pulmonary ROS, asthma , former smoker,    Pulmonary exam normal        Cardiovascular Exercise Tolerance: Good hypertension, On Medications negative cardio ROS Normal cardiovascular exam Rhythm:regular Rate:Normal     Neuro/Psych PSYCHIATRIC DISORDERS Anxiety  Neuromuscular disease negative neurological ROS  negative psych ROS   GI/Hepatic negative GI ROS, Neg liver ROS, GERD  ,  Endo/Other  negative endocrine ROS  Renal/GU negative Renal ROS  negative genitourinary   Musculoskeletal   Abdominal   Peds  Hematology negative hematology ROS (+)   Anesthesia Other Findings Past Medical History: No date: Allergic rhinitis, cause unspecified No date: Asthma     Comment:  not bad per pt No date: Dermatophytosis of scalp and beard No date: Diverticulosis No date: Essential and other specified forms of tremor No date: Fatty liver 2017: Fatty liver disease, nonalcoholic No date: GERD (gastroesophageal reflux disease) No date: HTN (hypertension)     Comment:  history of No date: Irritable bowel syndrome No date: Lumbago     Comment:  bulging disk per MRI  No date: Mixed hyperlipidemia No date: Other acute reactions to stress No date: Other diseases of lung, not elsewhere classified     Comment:  solitary pulm. nodule(left) No date: PONV (postoperative nausea and vomiting) No date: Thyroid nodule No date: Unspecified asthma(493.90) No date: Unspecified vitamin D deficiency Past Surgical History: No date:  ABDOMINAL HYSTERECTOMY 08/23/2016: CHOLECYSTECTOMY; N/A     Comment:  Procedure: LAPAROSCOPIC CHOLECYSTECTOMY;  Surgeon:               Clovis Riley, MD;  Location: [facility];  Service:               General;  Laterality: N/A; No date: COLONOSCOPY 03/09/2017: ESOPHAGOGASTRODUODENOSCOPY (EGD) WITH PROPOFOL; N/A     Comment:  Procedure: ESOPHAGOGASTRODUODENOSCOPY (EGD) WITH               PROPOFOL;  Surgeon: Jonathon Bellows, MD;  [facility]               ENDOSCOPY;  Service: Gastroenterology;  Laterality: N/A; 2002: LAPAROSCOPIC TOTAL HYSTERECTOMY No date: LAPAROSCOPIC UNILATERAL SALPINGO OOPHERECTOMY 1967/68: TONSILLECTOMY 1986: TUBAL LIGATION BMI    Body Mass Index: 26.13 kg/m     Reproductive/Obstetrics negative OB ROS                             Anesthesia Physical Anesthesia Plan  ASA: III  Anesthesia Plan: General   Post-op Pain Management:    Induction:   PONV Risk Score and Plan:   Airway Management Planned:   Additional Equipment:   Intra-op Plan:   Post-operative Plan:   Informed Consent: I have reviewed the patients History and Physical, chart, labs and discussed the procedure including the risks, benefits and alternatives for the proposed anesthesia with the patient or authorized representative who has indicated his/her understanding and acceptance.     Dental Advisory Given  Plan Discussed with: CRNA  Anesthesia Plan Comments:  Anesthesia Quick Evaluation  

## 2019-01-26 NOTE — H&P (Signed)
Jonathon Bellows, MD [address] [place], [place], [place], [address] [address], [place], [place], [place], [address] Phone: [phone]  Fax: [phone]  Primary Care Physician:  Jinny Sanders, MD   Pre-Procedure History & Physical: HPI:  Michelle Donovan is a 73 y.o. female is here for an colonoscopy.   Past Medical History:  Diagnosis Date  . Allergic rhinitis, cause unspecified   . Asthma    not bad per pt  . Dermatophytosis of scalp and beard   . Diverticulosis   . Essential and other specified forms of tremor   . Fatty liver   . Fatty liver disease, nonalcoholic 0000000  . GERD (gastroesophageal reflux disease)   . HTN (hypertension)    history of  . Irritable bowel syndrome   . Lumbago    bulging disk per MRI   . Mixed hyperlipidemia   . Other acute reactions to stress   . Other diseases of lung, not elsewhere classified    solitary pulm. nodule(left)  . PONV (postoperative nausea and vomiting)   . Thyroid nodule   . Unspecified asthma(493.90)   . Unspecified vitamin D deficiency     Past Surgical History:  Procedure Laterality Date  . ABDOMINAL HYSTERECTOMY    . CHOLECYSTECTOMY N/A 08/23/2016   Procedure: LAPAROSCOPIC CHOLECYSTECTOMY;  Surgeon: Clovis Riley, MD;  Location: [facility];  Service: General;  Laterality: N/A;  . COLONOSCOPY    . ESOPHAGOGASTRODUODENOSCOPY (EGD) WITH PROPOFOL N/A 03/09/2017   Procedure: ESOPHAGOGASTRODUODENOSCOPY (EGD) WITH PROPOFOL;  Surgeon: Jonathon Bellows, MD;  [facility]: [facility] ENDOSCOPY;  Service: Gastroenterology;  Laterality: N/A;  . LAPAROSCOPIC TOTAL HYSTERECTOMY  2002  . LAPAROSCOPIC UNILATERAL SALPINGO OOPHERECTOMY    . TONSILLECTOMY  1967/68  . TUBAL LIGATION  1986    Prior to Admission medications   Medication Sig Start Date End Date Taking? Authorizing Provider  cetirizine (ZYRTEC) 10 MG tablet Take 10 mg by mouth daily.   Yes [provider]  clonazePAM (KLONOPIN) 0.5 MG tablet TAKE 1 TAB BY MOUTH  EVERY MORNING AND HALF A TAB EVERY NIGHT 08/24/18  Yes Tat, Rebecca S, DO  montelukast (SINGULAIR) 10 MG tablet TAKE 1 TABLET BY MOUTH EVERYDAY AT BEDTIME 04/18/18  Yes Bedsole, Amy E, MD  propranolol (INDERAL) 60 MG tablet TAKE 1 TABLET (60 MG TOTAL) BY MOUTH 2 (TWO) TIMES DAILY. 06/19/18  Yes Bedsole, Amy E, MD  trihexyphenidyl (ARTANE) 2 MG tablet TAKE 1 TABLET (2 MG TOTAL) BY MOUTH AT BEDTIME. 11/29/18  Yes Tat, Eustace Quail, DO  valACYclovir (VALTREX) 1000 MG tablet Take 2 tabs every 12 hours x 1 day as needed for cold sores 05/25/16  Yes Baity, Coralie Keens, NP  vitamin B-12 (CYANOCOBALAMIN) 1000 MCG tablet Take 1,000 mcg by mouth daily.    [provider]  VITAMIN D PO Take by mouth.    [provider]    Allergies as of 01/12/2019 - Review Complete 01/11/2019  Allergen Reaction Noted  . Tylenol [acetaminophen] Other (See Comments) 08/16/2016  . Codeine Nausea And Vomiting 02/06/2007  . Topamax [topiramate] Other (See Comments) 04/08/2015    Family History  Problem Relation Age of Onset  . Stroke Mother   . Tremor Mother   . Diabetes Mother   . Heart failure Mother   . Other Father        Trigeminal neuralgia  . Diabetes Sister   . Coronary artery disease Sister   . Irritable bowel syndrome Sister   .  Tremor Sister   . Diabetes Brother        x 3  . Coronary artery disease Brother        x 3  . Irritable bowel syndrome Brother        x 3  . Tremor Brother        x 3  . Breast cancer Other        Aunts and cousin  . Colon cancer Maternal Grandfather     Social History   Socioeconomic History  . Marital status: Divorced    Spouse name: Not on file  . Number of children: 1  . Years of education: Not on file  . Highest education level: Not on file  Occupational History  . Occupation: retired    Fish farm manager: OTHER    Comment: [place]: [place]  . Occupation: part time    Employer: [employer]  . Financial resource  strain: Not on file  . Food insecurity    Worry: Not on file    Inability: Not on file  . Transportation needs    Medical: Not on file    Non-medical: Not on file  Tobacco Use  . Smoking status: Former Smoker    Types: Cigarettes    Quit date: 05/17/1980    Years since quitting: 38.7  . Smokeless tobacco: Never Used  Substance and Sexual Activity  . Alcohol use: No  . Drug use: No  . Sexual activity: Never  Lifestyle  . Physical activity    Days per week: Not on file    Minutes per session: Not on file  . Stress: Not on file  Relationships  . Social Herbalist on phone: Not on file    Gets together: Not on file    Attends religious service: Not on file    Active member of club or organization: Not on file    Attends meetings of clubs or organizations: Not on file    Relationship status: Not on file  . Intimate partner violence    Fear of current or ex partner: Not on file    Emotionally abused: Not on file    Physically abused: Not on file    Forced sexual activity: Not on file  Other Topics Concern  . 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 General:   Alert,  pleasant and cooperative in NAD Head:  Normocephalic and atraumatic. Neck:  Supple; no masses or thyromegaly. Lungs:  Clear throughout to auscultation, normal respiratory effort.    Heart:  +S1, +S2, Regular rate and rhythm, No edema. Abdomen:  Soft, nontender and nondistended. Normal bowel sounds, without guarding, and without rebound.   Neurologic:  Alert and  oriented x4;  grossly normal neurologically.  Impression/Plan: Michelle Donovan is here for an colonoscopy to be performed for evaluation of constipation. Risks, benefits, limitations, and  alternatives regarding  colonoscopy have been reviewed with the patient.  Questions have been answered.  All parties agreeable.   Jonathon Bellows, MD  01/26/2019, 10:10 AM

## 2019-01-26 NOTE — Transfer of Care (Signed)
Immediate Anesthesia Transfer of Care Note  Patient: Michelle Donovan  Procedure(s) Performed: COLONOSCOPY WITH PROPOFOL (N/A )  Patient Location: PACU  Anesthesia Type:General  Level of Consciousness: awake, alert  and oriented  Airway & Oxygen Therapy: Patient Spontanous Breathing and Patient connected to nasal cannula oxygen  Post-op Assessment: Report given to RN, Post -op Vital signs reviewed and stable and Patient moving all extremities X 4  Post vital signs: Reviewed and stable  Last Vitals:  Vitals Value Taken Time  BP 113/53 01/26/19 1040  Temp    Pulse 78 01/26/19 1041  Resp 19 01/26/19 1041  SpO2 96 % 01/26/19 1041  Vitals shown include unvalidated device data.  Last Pain:  Vitals:   01/26/19 1004  PainSc: 0-No pain         Complications: No apparent anesthesia complications

## 2019-01-26 NOTE — Anesthesia Post-op Follow-up Note (Signed)
Anesthesia QCDR form completed.        

## 2019-01-29 LAB — SURGICAL PATHOLOGY

## 2019-01-30 NOTE — Anesthesia Postprocedure Evaluation (Signed)
Anesthesia Post Note  Patient: Michelle Donovan  Procedure(s) Performed: COLONOSCOPY WITH PROPOFOL (N/A )  Patient location during evaluation: PACU Anesthesia Type: General Level of consciousness: awake and alert Pain management: pain level controlled Vital Signs Assessment: post-procedure vital signs reviewed and stable Respiratory status: spontaneous breathing, nonlabored ventilation, respiratory function stable and patient connected to nasal cannula oxygen Cardiovascular status: blood pressure returned to baseline and stable Postop Assessment: no apparent nausea or vomiting Anesthetic complications: no     Last Vitals:  Vitals:   01/26/19 1050 01/26/19 1100  BP: 124/65 (!) 125/56  Pulse: 72 70  Resp: (!) 21 (!) 9  Temp:    SpO2: 97% 99%    Last Pain:  Vitals:   01/27/19 1057  PainSc: 0-No pain                 Molli Barrows

## 2019-02-04 ENCOUNTER — Encounter: Payer: Self-pay | Admitting: Gastroenterology

## 2019-02-06 ENCOUNTER — Ambulatory Visit (INDEPENDENT_AMBULATORY_CARE_PROVIDER_SITE_OTHER): Payer: Medicare HMO | Admitting: Vascular Surgery

## 2019-02-15 ENCOUNTER — Other Ambulatory Visit: Payer: Self-pay

## 2019-02-15 DIAGNOSIS — Z20822 Contact with and (suspected) exposure to covid-19: Secondary | ICD-10-CM

## 2019-02-16 LAB — NOVEL CORONAVIRUS, NAA: SARS-CoV-2, NAA: NOT DETECTED

## 2019-02-19 MED ORDER — CLONAZEPAM 0.5 MG PO TABS: ORAL_TABLET | ORAL | 0 refills | Status: DC

## 2019-02-19 NOTE — Telephone Encounter (Signed)
Requested Prescriptions   Pending Prescriptions Disp Refills  . clonazePAM (KLONOPIN) 0.5 MG tablet 135 tablet 1    Sig: TAKE 1 TAB BY MOUTH EVERY MORNING AND HALF A TAB EVERY NIGHT   Rx last filled: 08/21/18 #135 1 refills  Pt last seen: 08/24/18   Follow up appt scheduled: 02/26/19

## 2019-02-22 NOTE — Progress Notes (Signed)
Subjective:    Michelle Donovan was seen in consultation in the movement disorder clinic at the request of Jinny Sanders, MD.  The evaluation is for tremor.  Pt has previously seen Dr. Leta Baptist and notes from 2012 were reviewed.  Pt reports that this was just a one time consultation in 2012.   The patient is a 73 y.o. right handed female with a history of tremor.   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  She states that it is in both hands but R is worse than the L (but she uses the right more). There is a family hx of tremor in her mother and brother.  She may use xanax very if going out (less than one time per month) to suppress tremor.    Affected by caffeine:  yes(drinks 8 oz coffee per da) Affected by alcohol:  unknown Affected by stress:  yes Affected by fatigue:  no Spills soup if on spoon:  yes (uses weighted utensils at home for this) Spills glass of liquid if full:  yes Affects ADL's (tying shoes, brushing teeth, etc):  no except trouble with eyeliner  11/13/13 update:  The patient returns today for follow up.  She was restarted on topamax last visit.  She was doing markedly better when it was first started but she has recently had some thyroid issues (multinodular goiter), pink eye, bilateral frozen shoulders and URI and tremor got worse during this.  She has been worked up for elevated liver enzymes but then repeat enzymes were normal and hepatitis panel was normal.  She had some paresthesias with the topamax on initiation.  She has lost some weight; no longer able to drink her coke as it tastes poorly with the topamax.    02/14/14 update:  Pt returns today for f/u.  Noted hair loss and called here and I d/c topamax 2 days ago.   Originally thought that hair loss due to highlights had put in hair over the summer but then realized that hair loss continued.   Pt frustrated as knew that topamax worked for tremor.   Has noted chin tremor.  Restarted back to work.    05/20/14 update:  Pt has a hx of ET.  Tried to d/c the topamax because of hair loss and start neurontin but tremor was not well controlled on neurontin alone and she really wanted wanted to go back on the topamax, knowing the risks.  She states that it isn't really falling out like it was but it isn't coming back like she hoped either.  She states that tremor isn't well controlled either.  She states that she wants to stay on the gabapentin because it is helping the frozen shoulder.  She is feeling depressed because of the tremor.  Asks if her thyroid nodules could be causing the tremor.  Her mother had a goiter and also had tremor.   02/18/15 update:  The patient presents today for follow-up.  I have not seen her since January, 2016.  She ended up wanting to increase the dose of Topamax, which she did to a total of 200 mg per day.  She sent me an email recently stating that tremor was less well controlled and she was having more hair loss.  She wanted to consider other options.  She admits that stress has increased greatly; her daughter and grandchild have moved back into her home.  She  reports that her daughter makes things very stressful for her.   She is noting tremor with eating, drinking, putting on makeup, writing.   She was on gabapentin for her shoulder pain but she has d/c it now.  She is on propranolol LA 120 mg daily for blood pressure.  We have talked about Artane in the past, but she has not wanted to try that.  She has tried only 1 pill of primidone in the past and it sounds like she had first dose effect and has not wanted to retry that.    05/02/15 update:  The patient is following up today regarding her essential tremor.  She is on Inderal LA, 120 mg daily.  Last visit, we cautiously start a low-dose clonazepam 0.5 mg, 1/2 tablet in the morning and half tablet at night. She states that the medication may be helping but not enough.  Fortunately,  it has not made her sleepy.   Her Topamax was discontinued because of side effects of hair loss. Still very stressed over living situation with daughter/grandson living in home.  Doesn't know what she will be doing for Christmas if anything and seems sad about that.  Her daughter is gone right now and her side of the family is going to [place] for christmas.  08/01/15 update:  The patient is following up today regarding her essential tremor.  She is on Inderal LA, 120 mg daily.  Last visit, we cautiously increase her clonazepam 0.5 mg, so that she is taking 1 tablet in the morning and half a tablet at night.  Unfortunately, despite my urging, she has refused to try primidone, because she had a first dose effect in the past when she took one tablet.  She states today that tremor has not been well controlled; she has occasional good days but they are few and far between.  11/04/15 update:  The patient is following up today regarding her essential tremor.  She is on Inderal LA which was increased last visit to 160 mg daily.  She went down to 120 mg daily because she was sleepy and she didn't think that it was helping much and asked me in an email about focused u/s.  I told her that insurance doesn't pay for this right now even though it was FDA approved.  She remains on clonazepam 0.5 mg, so that she is taking 1 tablet in the morning and half a tablet at night.  Unfortunately, despite my urging, she has refused to try primidone, because she had a first dose effect in the past when she took one tablet.  She states today that she continues to have tremor.  She is on prednisone currently because of "inflammation" in the top of the L foot.  She doesn't think that prednisone has changed tremor.   07/08/16 update:  Patient follows up today.  I have not seen her in about 8 months.  I did review her records since last visit.  I cautiously tried her on Artane last visit.  She emailed me in July to state that she thought that  the medication made her tremor worse.  It turns out that when she started the Artane, she stopped her clonazepam, which was not the intention, and is likely the reason that tremor increased.  I asked her to start the clonazepam again, 0.5 mg, one tablet in the morning and half a tablet at night.  I offered her an opinion at an academic center but she declined that.  She states that she cut back on the artane to 1/2 tablet (of the 2mg ) at night because of some dizziness and it seems to be helping some.  She is still on the Inderal LA, 120 g daily.  She denies hallucinations.  She denies falls.  She denies lightheadedness or near syncope.  11/18/16 update:  Pt seen in f/u.   Is on artane 1mg  qhs, inderal LA 120 mg and klonopin 0.5 mg, 1 in the AM and 1/2 at night. Tremor has been about the same.  Has better and worse days.   [facility] and the new one isn't as good and more trouble with cutting this new one.   The records that were made available to me were reviewed.  LFTs have been elevated and evaluated.  Fatty liver and gallstones notes on u/s.  Had cholecystectomy on 08/23/16.  Saw GI in May and felt that etiology was Non alcoholic fatty liver from dietary consumption of fast foods daily.  She was referred to dietician and saw her on 11/12/16.  05/24/17 update: Patient is seen today in follow-up for tremor.  She remains on trihexyphenidyl, 1 mg at night.  She is also on Inderal LA, 120 mg daily and clonazepam 0.5 mg, 1 tablet in the morning and half a tablet at night.  Tremor is well controlled "and I can write."  She is adament that she doesn't want to change meds even knowing her pulse.  I have reviewed her records since last visit.  She has been seen by gastroenterology for dysphasia since our last visit.  She had an EGD on March 09, 2017.  Mild Schatzki's ring was noted and was dilated. She isn't sure it helped.  She does state that she doesn't get choked as much but pills will get  stuck.   Liver function testing was much improved 3 months ago compared to 8 months ago.  She attributes this to giving up dark sodas.  She is still eating fast food but not as much as previously  12/27/17 update:  Pt seen in f/u for ET.  On artane 1 mg daily.  Was on inderal LA, 120 mg daily but reports that she changed to propranolol 60 mg bid due to cost. She is on  klonopin, 0.5 mg, 1 in the AM and 1/2 tablet at night.  She ran out of klonopin last week and had more tremor then.  Otherwise tremor has been well controlled.  She has had to have 2 teeth extracted and is awaiting implants.  Admits that diet isn't doing well but trying to limit dark sodas.   pt asks me about memory blood test.  C/o not remembering like used to and daughter thinks she should have test.  Is primarily word finding trouble  02/26/19 update: Patient seen today in follow-up for essential tremor.   She is also on trihexyphenidyl, 2 mg once per day and clonazepam 0.5 mg, 1 tablet in the morning and half tablet at bedtime.   She was out of her klonopin for the last week (didn't realize I already filled it a week ago).  Medical records have been reviewed since last visit.  She has seen primary care fairly recently to complain of daytime hypersomnolence.  It was felt that perhaps it was due to medication, although medication had not been changed in quite some time.  Lab work was completed and was fairly unremarkable.  Current/Previously tried tremor medications: topamax;  Metoprolol - felt drained ;  primidone (50 mg - 1/2 at night but got first dose effect and stopped it); meclizine (? Why); on propranolol now - ? Helping now; gabapentin; artane  Current medications that may exacerbate tremor:  n/a  Outside reports reviewed: historical medical records and referral letter/letters.  Allergies  Allergen Reactions   Tylenol [Acetaminophen] Other (See Comments)    Increased liver enzymes   Codeine Nausea And Vomiting   Topamax  [Topiramate] Other (See Comments)    Hair loss    Current Outpatient Medications on File Prior to Visit  Medication Sig Dispense Refill   cetirizine (ZYRTEC) 10 MG tablet Take 10 mg by mouth daily.     clonazePAM (KLONOPIN) 0.5 MG tablet TAKE 1 TAB BY MOUTH EVERY MORNING AND HALF A TAB EVERY NIGHT 135 tablet 0   montelukast (SINGULAIR) 10 MG tablet TAKE 1 TABLET BY MOUTH EVERYDAY AT BEDTIME 90 tablet 3   propranolol (INDERAL) 60 MG tablet TAKE 1 TABLET (60 MG TOTAL) BY MOUTH 2 (TWO) TIMES DAILY. 180 tablet 2   trihexyphenidyl (ARTANE) 2 MG tablet TAKE 1 TABLET (2 MG TOTAL) BY MOUTH AT BEDTIME. 90 tablet 1   valACYclovir (VALTREX) 1000 MG tablet Take 2 tabs every 12 hours x 1 day as needed for cold sores 20 tablet 0   vitamin B-12 (CYANOCOBALAMIN) 1000 MCG tablet Take 1,000 mcg by mouth daily.     VITAMIN D PO Take by mouth.     No current facility-administered medications on file prior to visit.     Past Medical History:  Diagnosis Date   Allergic rhinitis, cause unspecified    Asthma    not bad per pt   Dermatophytosis of scalp and beard    Diverticulosis    Essential and other specified forms of tremor    Fatty liver    Fatty liver disease, nonalcoholic 0000000   GERD (gastroesophageal reflux disease)    HTN (hypertension)    history of   Irritable bowel syndrome    Lumbago    bulging disk per MRI    Mixed hyperlipidemia    Other acute reactions to stress    Other diseases of lung, not elsewhere classified    solitary pulm. nodule(left)   PONV (postoperative nausea and vomiting)    Thyroid nodule    Unspecified asthma(493.90)    Unspecified vitamin D deficiency     Past Surgical History:  Procedure Laterality Date   ABDOMINAL HYSTERECTOMY     CHOLECYSTECTOMY N/A 08/23/2016   Procedure: LAPAROSCOPIC CHOLECYSTECTOMY;  Surgeon: Clovis Riley, MD;  Location: [facility];  Service: General;  Laterality: N/A;   COLONOSCOPY     COLONOSCOPY WITH  PROPOFOL N/A 01/26/2019   Procedure: COLONOSCOPY WITH PROPOFOL;  Surgeon: Jonathon Bellows, MD;  Location[facility] ENDOSCOPY;  Service: Gastroenterology;  Laterality: N/A;   ESOPHAGOGASTRODUODENOSCOPY (EGD) WITH PROPOFOL N/A 03/09/2017   Procedure: ESOPHAGOGASTRODUODENOSCOPY (EGD) WITH PROPOFOL;  Surgeon: Jonathon Bellows, MD;  Location: [facility] ENDOSCOPY;  Service: Gastroenterology;  Laterality: N/A;   LAPAROSCOPIC TOTAL HYSTERECTOMY  2002   LAPAROSCOPIC UNILATERAL SALPINGO OOPHERECTOMY     TONSILLECTOMY  1967/68   TUBAL LIGATION  1986    Social History   Socioeconomic History   Marital status: Divorced    Spouse name: Not on file   Number of children: 1   Years of education: Not on file   Highest education level: High school graduate  Occupational History   Occupation: retired    Fish farm manager: [place]: [employer]  Employer: RETIRED   Occupation: part time    Employer: [employer] resource strain: Not on file   Food insecurity    Worry: Not on file    Inability: Not on file   Transportation needs    Medical: Not on file    Non-medical: Not on file  Tobacco Use   Smoking status: Former Smoker    Types: Cigarettes    Quit date: 05/17/1980    Years since quitting: 38.8   Smokeless tobacco: Never Used  Substance and Sexual Activity   Alcohol use: No   Drug use: No   Sexual activity: Never  Lifestyle   Physical activity    Days per week: Not on file    Minutes per session: Not on file   Stress: Not on file  Relationships   Social connections    Talks on phone: Not on file    Gets together: Not on file    Attends religious service: Not on file    Active member of club or organization: Not on file    Attends meetings of clubs or organizations: Not on file    Relationship status: Not on file   Intimate partner violence    Fear of current or ex partner: Not on file    Emotionally abused: Not on file    Physically  abused: Not on file    Forced sexual activity: Not on file  Other Topics Concern   Not on file  Social History Narrative   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[place]      Divorced      1 daughter-healthy      No regular exercise      Some veggies; rare fruit          Family Status  Relation Name Status   Mother  Deceased at age 62       CHF, Essential Tremor   Father  Deceased       old age, heart disease   Brother  Alive       diabetes   Brother  Alive       COPD, heart disease   Brother  Alive       Benign Essential Tremor   Sister  Alive       healthy   Daughter  Alive       anxiety   Sister  (Not Specified)   Sister  (Not Specified)   Sister  (Not Specified)   Sister  (Not Specified)   Brother  (Not Specified)   Brother  (Not Specified)   Brother  (Not Specified)   Brother  (Not Specified)   Other  (Not Specified)   MGF  (Not Specified)    Review of Systems A complete 10 system ROS was obtained and was negative apart from what is mentioned.   Objective:   VITALS:   Vitals:   02/26/19 1342  BP: (!) 143/69  Pulse: (!) 50  SpO2: 98%  Weight: 155 lb 3.2 oz (70.4 kg)  Height: 5\' 5"  (1.651 m)   Wt Readings from Last 3 Encounters:  02/26/19 155 lb 3.2 oz (70.4 kg)  01/26/19 157 lb (71.2 kg)  01/11/19 157 lb 6.4 oz (71.4 kg)    GEN:  The patient appears stated age and is in NAD. HEENT:  Normocephalic, atraumatic.  The mucous membranes are moist. The superficial temporal arteries are without ropiness or tenderness. CV:  Loletha Grayer.  regular  Lungs:  CTAB Neck/HEME:  There are no carotid bruits bilaterally.  Neurological examination:  Orientation: The patient is alert and oriented x3. Cranial nerves: There is good facial symmetry. The speech is fluent and clear. Soft palate rises symmetrically and there is no tongue deviation. Hearing is intact to conversational tone. Sensation: Sensation is intact to light touch throughout Motor: Strength is 5/5  in the bilateral upper and lower extremities.   Shoulder shrug is equal and symmetric.  There is no pronator drift.  Movement examination: Tone: There is normal tone in the upper and lower extremities. Abnormal movements: No rest tremor.  Mild postural tremor.  Slightly increased with intention on both sides.  Some trouble with Archimedes spirals on the left. Coordination:  There is no decremation with RAM's, with any form of RAMS, including alternating supination and pronation of the forearm, hand opening and closing, finger taps, heel taps and toe taps. Gait and Station: The patient walks well down the hall without difficulty.  Labs:  Lab Results  Component Value Date   TSH 0.95 12/25/2018     Chemistry      Component Value Date/Time   NA 140 12/25/2018 1002   K 4.5 12/25/2018 1002   CL 106 12/25/2018 1002   CO2 26 12/25/2018 1002   BUN 13 12/25/2018 1002   CREATININE 0.73 12/25/2018 1002   CREATININE 0.77 10/05/2013 1526      Component Value Date/Time   CALCIUM 9.2 12/25/2018 1002   ALKPHOS 109 12/25/2018 1002   AST 32 12/25/2018 1002   ALT 42 (H) 12/25/2018 1002   BILITOT 0.3 12/25/2018 1002        Lab Results  Component Value Date   WBC 6.6 12/25/2018   HGB 12.6 12/25/2018   HCT 39.0 12/25/2018   MCV 88.6 12/25/2018   PLT 195.0 12/25/2018   Lab Results  Component Value Date   TSH 0.95 12/25/2018   Lab Results  Component Value Date   VITAMINB12 444 12/25/2018   Lab Results  Component Value Date   HGBA1C 6.1 12/25/2018      Assessment/Plan:   1.  Essential Tremor.  --She finally feels that she has good control on her essential tremor and really does not want to change medications.  I talked to her about the fact that she has had intermittent bradycardia, which is likely due to her Inderal LA.  She insists that she has had no signs or symptoms of this and really does not want to change it.  She states that it took a long time for her tremor to be well  controlled and would like to not make changes.  She will certainly let me know if she has any lightheadedness or near syncopal feelings.  For now, she opted to remain on propranolol, 60 mg twice per day.  She is also on Artane, 2 mg once per day as well as clonazepam 0.5 mg, 1 tablet in the morning and half a tablet at night.  Klonopin was refilled on 02/19/19.  PDMP is reviewed.  2.  Mild B12 deficiency.  -back on b12 supplement 3.  Depression  -no SI/HI 4.  Non alcoholic fatty liver disease  -due to consumption of fast foods/poor diet .  Liver function studies are markedly improved with improvement of diet and discontinuation of sodas. 5.   Memory change  -no evidence of neurodegenerative process 6.  Follow up is anticipated in the next 6 months.

## 2019-02-26 ENCOUNTER — Encounter: Payer: Self-pay | Admitting: Neurology

## 2019-02-26 ENCOUNTER — Ambulatory Visit: Payer: Medicare HMO | Admitting: Neurology

## 2019-02-26 ENCOUNTER — Other Ambulatory Visit: Payer: Self-pay

## 2019-02-26 VITALS — BP 143/69 | HR 50 | Ht 65.0 in | Wt 155.2 lb

## 2019-02-26 DIAGNOSIS — G250 Essential tremor: Secondary | ICD-10-CM

## 2019-03-15 ENCOUNTER — Telehealth: Payer: Self-pay | Admitting: *Deleted

## 2019-03-15 MED ORDER — PROPRANOLOL HCL 60 MG PO TABS: 60.0000 mg | ORAL_TABLET | Freq: Two times a day (BID) | ORAL | 0 refills | Status: DC

## 2019-03-15 MED ORDER — MONTELUKAST SODIUM 10 MG PO TABS: ORAL_TABLET | ORAL | 0 refills | Status: DC

## 2019-03-15 NOTE — Telephone Encounter (Signed)
Please schedule MWV with Nurse and CPE with Dr. Bedsole. °

## 2019-03-16 ENCOUNTER — Other Ambulatory Visit: Payer: Self-pay

## 2019-03-21 NOTE — Telephone Encounter (Signed)
Spoke to pt she stated she moved to [place] and is looking for PCP there.  She stated if she doesn't find one she will call back to schedule

## 2019-04-23 MED ORDER — MONTELUKAST SODIUM 10 MG PO TABS: ORAL_TABLET | ORAL | 0 refills | Status: DC

## 2019-04-24 NOTE — Telephone Encounter (Signed)
Hello, I hope you are doing well! This longtime patient of mine has moved to [place]. Are you accepting new patients or is there someone you would recommend to her? Thanks,  Latravious Levitt

## 2019-04-25 NOTE — Telephone Encounter (Signed)
Hi Amy,  I am not technically taking new patients but I would be happy to take over her care.  I can have one of our front office staff reach out to her if you would like.  Do you have a preference for when you would like for her to be seen?  Cat

## 2019-04-27 ENCOUNTER — Telehealth: Payer: Self-pay | Admitting: Family Medicine

## 2019-04-27 NOTE — Telephone Encounter (Signed)
Appointment  Ernst Breach routed conversation to Hali Marry, MD; [facility] Pool 43 minutes ago (9:06 AM)  Ernst Breach 43 minutes ago (9:06 AM)  JZ   Left patient a voicemail with information below. Let patient know to call us back to schedule a new patient appointment with Dr.Metheney.       Documentation   Paulina Fusi, Yolinda, Kender 46 minutes ago (9:04 AM)

## 2019-04-27 NOTE — Telephone Encounter (Signed)
Left patient a voicemail with information below. Let patient know to call us back to schedule a new patient appointment with Dr.Metheney.

## 2019-05-03 ENCOUNTER — Telehealth: Payer: Self-pay

## 2019-05-03 DIAGNOSIS — R270 Ataxia, unspecified: Secondary | ICD-10-CM

## 2019-05-03 DIAGNOSIS — S199XXA Unspecified injury of neck, initial encounter: Secondary | ICD-10-CM

## 2019-05-03 DIAGNOSIS — R413 Other amnesia: Secondary | ICD-10-CM

## 2019-05-03 DIAGNOSIS — W19XXXA Unspecified fall, initial encounter: Secondary | ICD-10-CM

## 2019-05-03 DIAGNOSIS — S0990XA Unspecified injury of head, initial encounter: Secondary | ICD-10-CM

## 2019-05-03 NOTE — Telephone Encounter (Signed)
-----   Message from Thorp, DO sent at 05/03/2019  7:38 AM EST ----- 1.  Order CT brain without contrast dx:  fall, head trauma, headache 2.  Order neuropsych testing with Dr. Melvyn Novas  dx: memory loss

## 2019-05-21 ENCOUNTER — Ambulatory Visit
Admission: RE | Admit: 2019-05-21 | Discharge: 2019-05-21 | Disposition: A | Discharge status: Home / Self Care | Payer: Medicare HMO | Source: Ambulatory Visit | Attending: Neurology | Admitting: Neurology

## 2019-05-21 ENCOUNTER — Other Ambulatory Visit: Payer: Self-pay

## 2019-05-21 DIAGNOSIS — R270 Ataxia, unspecified: Secondary | ICD-10-CM

## 2019-05-21 DIAGNOSIS — S199XXA Unspecified injury of neck, initial encounter: Secondary | ICD-10-CM

## 2019-05-21 DIAGNOSIS — S0990XA Unspecified injury of head, initial encounter: Secondary | ICD-10-CM | POA: Diagnosis not present

## 2019-05-21 DIAGNOSIS — W19XXXA Unspecified fall, initial encounter: Secondary | ICD-10-CM

## 2019-05-26 ENCOUNTER — Other Ambulatory Visit: Payer: Self-pay

## 2019-05-28 ENCOUNTER — Telehealth: Payer: Self-pay | Admitting: Neurology

## 2019-05-28 ENCOUNTER — Other Ambulatory Visit: Payer: Self-pay

## 2019-05-28 ENCOUNTER — Other Ambulatory Visit: Payer: Self-pay | Admitting: Neurology

## 2019-05-28 MED ORDER — CLONAZEPAM 0.5 MG PO TABS: ORAL_TABLET | ORAL | 1 refills | Status: DC

## 2019-05-28 MED ORDER — CLONAZEPAM 0.5 MG PO TABS: ORAL_TABLET | ORAL | 0 refills | Status: DC

## 2019-05-28 NOTE — Telephone Encounter (Signed)
Patient called and said her prescription for clonazepam .5 MG was sent to the wrong pharmacy. [facility] [address] [facility] [place]   [facility] [place] on [address]. [phone]

## 2019-05-28 NOTE — Telephone Encounter (Signed)
Will you first call the [place] store and cancel it and then let me know.  Once that one is cancelled I will send in the other one to [place].

## 2019-05-29 MED ORDER — CLONAZEPAM 0.5 MG PO TABS: ORAL_TABLET | ORAL | 1 refills | Status: DC

## 2019-05-29 NOTE — Telephone Encounter (Signed)
I will call [place] at 8 when they open and ill let you know

## 2019-05-29 NOTE — Addendum Note (Signed)
Addended by: Ludwig Clarks on: 05/29/2019 08:38 AM   Modules accepted: Orders

## 2019-05-29 NOTE — Telephone Encounter (Signed)
[facility] [place] [facility] 0.5[facility]  was cancelled

## 2019-06-01 ENCOUNTER — Ambulatory Visit (INDEPENDENT_AMBULATORY_CARE_PROVIDER_SITE_OTHER): Payer: Medicare HMO | Admitting: Family Medicine

## 2019-06-01 ENCOUNTER — Encounter: Payer: Self-pay | Admitting: Family Medicine

## 2019-06-01 ENCOUNTER — Other Ambulatory Visit: Payer: Self-pay

## 2019-06-01 VITALS — BP 128/74 | HR 54 | Ht 65.0 in | Wt 154.0 lb

## 2019-06-01 DIAGNOSIS — I10 Essential (primary) hypertension: Secondary | ICD-10-CM | POA: Diagnosis not present

## 2019-06-01 DIAGNOSIS — G250 Essential tremor: Secondary | ICD-10-CM | POA: Diagnosis not present

## 2019-06-01 DIAGNOSIS — Z85828 Personal history of other malignant neoplasm of skin: Secondary | ICD-10-CM | POA: Diagnosis not present

## 2019-06-01 DIAGNOSIS — E042 Nontoxic multinodular goiter: Secondary | ICD-10-CM

## 2019-06-01 DIAGNOSIS — J4530 Mild persistent asthma, uncomplicated: Secondary | ICD-10-CM

## 2019-06-01 DIAGNOSIS — E782 Mixed hyperlipidemia: Secondary | ICD-10-CM

## 2019-06-01 NOTE — Assessment & Plan Note (Signed)
Currently on propranolol and clonazepam and follows with Dr. Carles Collet.  We had a long discussion today about the long-term use of clonazepam and its risks.  Strongly encouraged her to consider working on weaning down on her medication.  Also explained to her that when she cuts back or misses a dose that it is not an unusual response to have an increase in tremor even in someone who does not have a tremor.  It is in fact a withdrawal side effect.

## 2019-06-01 NOTE — Assessment & Plan Note (Signed)
Continue nightly Singulair.  She says she has not needed a rescue inhaler in quite some time.

## 2019-06-01 NOTE — Progress Notes (Signed)
Established Patient Office Visit  Subjective:  Patient ID: Michelle Donovan, female    DOB: 1946-03-12  Age: 74 y.o. MRN: [id number]  CC:  Chief Complaint  Patient presents with  . Establish Care    HPI Michelle Donovan presents to establish care.  She has been a [facility] patient for quite some time and was previously being followed by Dr. Gracelyn Nurse.  She recently moved to [place] and so is transferring care to our location.  She has a history of essential tremor which is currently being followed by Dr. Wells Guiles Tat in neurology.  She also not that long ago suffered a recurrent head injury and is also been followed by Dr. Theda Sers for that.  In the past she has tried Topamax which actually was really helpful for her essential tremor but caused hair loss it was discontinued.  Currently she is on propranolol 60 mg twice a day and that does seem to be working well.  She also has a history of nonalcoholic fatty liver disease and has had ultrasound and is followed periodically with her liver enzymes.  She also reports history of thyroid nodules that are mostly just followed as well as a lung nodule which is been evaluated and found to be negative for any type of malignancy.  She also has a history of cold sores for which she uses Valtrex as needed.  She also reports a history of having had a couple basal cell skin cancers but no melanoma.  She also has high blood pressure and hyperlipidemia as well as a history of colon polyps and asthma.    She also reports that she uses clonazepam.  Mostly to help with her tremor and says it is effective in fact if she misses a dose her tremor really intensifies.  She does not need any refills at this time.  She currently lives alone and admits she does not eat right but plans on doing better.  She just says that since moving here she really has not gotten into quite a routine yet.  Her new home has gas heat and she feels like sometimes  that triggers the cough.  Past Medical History:  Diagnosis Date  . Allergic rhinitis, cause unspecified   . Asthma    not bad per pt  . Dermatophytosis of scalp and beard   . Diverticulosis   . Essential and other specified forms of tremor   . Fatty liver   . Fatty liver disease, nonalcoholic 0000000  . GERD (gastroesophageal reflux disease)   . HTN (hypertension)    history of  . Irritable bowel syndrome   . Lumbago    bulging disk per MRI   . Mixed hyperlipidemia   . Other acute reactions to stress   . Other diseases of lung, not elsewhere classified    solitary pulm. nodule(left)  . PONV (postoperative nausea and vomiting)   . Thyroid nodule   . Unspecified asthma(493.90)   . Unspecified vitamin D deficiency     Past Surgical History:  Procedure Laterality Date  . ABDOMINAL HYSTERECTOMY    . CHOLECYSTECTOMY N/A 08/23/2016   Procedure: LAPAROSCOPIC CHOLECYSTECTOMY;  Surgeon: Clovis Riley, MD;  Location: [facility];  Service: General;  Laterality: N/A;  . COLONOSCOPY    . COLONOSCOPY WITH PROPOFOL N/A 01/26/2019   Procedure: COLONOSCOPY WITH PROPOFOL;  Surgeon: Jonathon Bellows, MD;  Location: [facility] ENDOSCOPY;  Service: Gastroenterology;  Laterality: N/A;  . ESOPHAGOGASTRODUODENOSCOPY (EGD) WITH PROPOFOL N/A 03/09/2017  Procedure: ESOPHAGOGASTRODUODENOSCOPY (EGD) WITH PROPOFOL;  Surgeon: Jonathon Bellows, MD;  Location: [facility] ENDOSCOPY;  Service: Gastroenterology;  Laterality: N/A;  . LAPAROSCOPIC TOTAL HYSTERECTOMY  2002  . LAPAROSCOPIC UNILATERAL SALPINGO OOPHERECTOMY    . TONSILLECTOMY  1967/68  . TUBAL LIGATION  1986    Family History  Problem Relation Age of Onset  . Stroke Mother   . Tremor Mother   . Diabetes Mother   . Heart failure Mother   . Other Father        Trigeminal neuralgia  . Diabetes Sister   . Coronary artery disease Sister   . Irritable bowel syndrome Sister   . Tremor Sister   . Diabetes Brother        x 3  . Coronary artery disease Brother        x 3   . Irritable bowel syndrome Brother        x 3  . Tremor Brother        x 3  . Breast cancer Other        Aunts and cousin  . Colon cancer Maternal Grandfather     Social History   Socioeconomic History  . Marital status: Divorced    Spouse name: Not on file  . Number of children: 1  . Years of education: Not on file  . Highest education level: High school graduate  Occupational History  . Occupation: retired    Fish farm manager: OTHER    Comment: [employer]: [place]  . Occupation: part time    Employer: [employer]  Tobacco Use  . Smoking status: Former Smoker    Types: Cigarettes    Quit date: 05/17/1980    Years since quitting: 39.0  . Smokeless tobacco: Never Used  Substance and Sexual Activity  . Alcohol use: No  . Drug use: No  . Sexual activity: Never  Other Topics Concern  . 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 Difficulty of Paying Living Expenses: Not on file  Food Insecurity:   . Worried About Charity fundraiser in the Last Year: Not on file  . Ran Out of Food in the Last Year: Not on file  Transportation Needs:   . Lack of Transportation (Medical): Not on file  . Lack of Transportation (Non-Medical): Not on file  Physical Activity: Inactive  . Days of Exercise per Week: 0 days  . Minutes of Exercise per Session: 0 min  Stress:   . Feeling of Stress : Not on file  Social Connections:   . Frequency of Communication with Friends and Family: Not on file  . Frequency of Social Gatherings with Friends and Family: Not on file  . Attends Religious Services: Not on file  . Active Member of Clubs or Organizations: Not on file  . Attends Archivist Meetings: Not on file  . Marital Status: Not on file  Intimate Partner Violence:    . Fear of Current or Ex-Partner: Not on file  . Emotionally Abused: Not on file  . Physically Abused: Not on file  . Sexually Abused: Not on file    Outpatient Medications Prior  to Visit  Medication Sig Dispense Refill  . cetirizine (ZYRTEC) 10 MG tablet Take 10 mg by mouth daily.    . clonazePAM (KLONOPIN) 0.5 MG tablet TAKE 1 TAB BY MOUTH EVERY MORNING AND HALF A TAB EVERY NIGHT 135 tablet 1  . montelukast (SINGULAIR) 10 MG tablet TAKE 1 TABLET BY MOUTH EVERYDAY AT BEDTIME 90 tablet 0  . propranolol (INDERAL) 60 MG tablet Take 1 tablet (60 mg total) by mouth 2 (two) times daily. 180 tablet 0  . trihexyphenidyl (ARTANE) 2 MG tablet TAKE 1 TABLET (2 MG TOTAL) BY MOUTH AT BEDTIME. 90 tablet 1  . valACYclovir (VALTREX) 1000 MG tablet Take 2 tabs every 12 hours x 1 day as needed for cold sores 20 tablet 0  . VITAMIN D PO Take by mouth.    . vitamin B-12 (CYANOCOBALAMIN) 1000 MCG tablet Take 1,000 mcg by mouth daily.     No facility-administered medications prior to visit.    Allergies  Allergen Reactions  . Tylenol [Acetaminophen] Other (See Comments)    Increased liver enzymes  . Codeine Nausea And Vomiting  . Topamax [Topiramate] Other (See Comments)    Hair loss    ROS Review of Systems  Constitutional: Positive for fatigue. Negative for diaphoresis, fever and unexpected weight change.  HENT: Negative for hearing loss, postnasal drip, sneezing and tinnitus.   Eyes: Negative for visual disturbance.  Respiratory: Positive for cough. Negative for wheezing.   Cardiovascular: Positive for leg swelling. Negative for chest pain and palpitations.  Gastrointestinal: Positive for constipation.  Genitourinary: Negative for vaginal bleeding and vaginal discharge.  Musculoskeletal: Positive for back pain and neck pain. Negative for arthralgias.  Skin:       itching  Neurological: Positive for light-headedness and headaches.  Hematological: Negative for adenopathy. Does not bruise/bleed  easily.      Objective:    Physical Exam  Constitutional: She is oriented to person, place, and time. She appears well-developed and well-nourished.  HENT:  Head: Normocephalic and atraumatic.  Cardiovascular: Normal rate, regular rhythm and normal heart sounds.  Pulmonary/Chest: Effort normal and breath sounds normal.  Neurological: She is alert and oriented to person, place, and time.  Skin: Skin is warm and dry.  Psychiatric: She has a normal mood and affect. Her behavior is normal.    BP 128/74   Pulse (!) 54   Ht 5\' 5"  (1.651 m)   Wt 154 lb (69.9 kg)   SpO2 98%   BMI 25.63 kg/m  Wt Readings from Last 3 Encounters:  06/01/19 154 lb (69.9 kg)  02/26/19 155 lb 3.2 oz (70.4 kg)  01/26/19 157 lb (71.2 kg)     Health Maintenance Due  Topic Date Due  . PNA vac Low Risk Adult (1 of 2 - PCV13) 12/18/2012  . INFLUENZA VACCINE  12/16/2018  . Fecal DNA (Cologuard)  02/18/2019    There are no preventive care reminders to display for this patient.  Lab Results  Component Value Date   TSH 0.95 12/25/2018   Lab Results  Component Value Date   WBC 6.6 12/25/2018   HGB 12.6 12/25/2018   HCT 39.0 12/25/2018   MCV 88.6 12/25/2018   PLT 195.0 12/25/2018   Lab Results  Component Value Date   NA 140 12/25/2018   K 4.5 12/25/2018   CO2 26 12/25/2018   GLUCOSE 104 (H) 12/25/2018   BUN 13 12/25/2018   CREATININE 0.73 12/25/2018   BILITOT 0.3 12/25/2018   ALKPHOS 109 12/25/2018  AST 32 12/25/2018   ALT 42 (H) 12/25/2018   PROT 6.5 12/25/2018   ALBUMIN 3.8 12/25/2018   CALCIUM 9.2 12/25/2018   ANIONGAP 9 05/13/2018   GFR 78.59 12/25/2018   Lab Results  Component Value Date   CHOL 256 (H) 03/01/2018   Lab Results  Component Value Date   HDL 36.00 (L) 03/01/2018   Lab Results  Component Value Date   LDLCALC 185 (H) 03/01/2018   Lab Results  Component Value Date   TRIG 174.0 (H) 03/01/2018   Lab Results  Component Value Date   CHOLHDL 7 03/01/2018    Lab Results  Component Value Date   HGBA1C 6.1 12/25/2018      Assessment & Plan:   Problem List Items Addressed This Visit      Cardiovascular and Mediastinum   Benign essential hypertension - Primary    Blood pressure looks fantastic today.  The plan will be to follow that regularly every 6 months along with labs.  She is due for fasting lipids.      Relevant Orders   BASIC METABOLIC PANEL WITH GFR   Lipid Panel w/reflex Direct LDL     Respiratory   Asthma, mild persistent    Continue nightly Singulair.  She says she has not needed a rescue inhaler in quite some time.        Endocrine   Multiple thyroid nodules    Recommend monitor TSH yearly.        Nervous and Auditory   Essential tremor    Currently on propranolol and clonazepam and follows with Dr. Carles Collet.  We had a long discussion today about the long-term use of clonazepam and its risks.  Strongly encouraged her to consider working on weaning down on her medication.  Also explained to her that when she cuts back or misses a dose that it is not an unusual response to have an increase in tremor even in someone who does not have a tremor.  It is in fact a withdrawal side effect.        Musculoskeletal and Integument   History of basal cell cancer     Other   HYPERLIPIDEMIA    Due to recheck lipids.  Lab slip provided.      Relevant Orders   BASIC METABOLIC PANEL WITH GFR   Lipid Panel w/reflex Direct LDL      No orders of the defined types were placed in this encounter.   Follow-up: Return in about 4 months (around 09/29/2019).    Beatrice Lecher, MD

## 2019-06-01 NOTE — Assessment & Plan Note (Signed)
Recommend monitor TSH yearly.

## 2019-06-01 NOTE — Assessment & Plan Note (Signed)
Blood pressure looks fantastic today.  The plan will be to follow that regularly every 6 months along with labs.  She is due for fasting lipids.

## 2019-06-01 NOTE — Assessment & Plan Note (Signed)
Due to recheck lipids.  Lab slip provided.

## 2019-06-16 ENCOUNTER — Other Ambulatory Visit: Payer: Self-pay | Admitting: Family Medicine

## 2019-06-28 DIAGNOSIS — E782 Mixed hyperlipidemia: Secondary | ICD-10-CM | POA: Diagnosis not present

## 2019-06-28 DIAGNOSIS — I10 Essential (primary) hypertension: Secondary | ICD-10-CM | POA: Diagnosis not present

## 2019-06-28 LAB — BASIC METABOLIC PANEL WITH GFR
BUN: 10 mg/dL (ref 7–25)
CO2: 28 mmol/L (ref 20–32)
Calcium: 9.3 mg/dL (ref 8.6–10.4)
Chloride: 105 mmol/L (ref 98–110)
Creat: 0.78 mg/dL (ref 0.60–0.93)
GFR, Est African American: 89 mL/min/{1.73_m2} (ref >=60)
GFR, Est Non African American: 76 mL/min/{1.73_m2} (ref >=60)
Glucose, Bld: 110 mg/dL — ABNORMAL HIGH (ref 65–99)
Potassium: 4.7 mmol/L (ref 3.5–5.3)
Sodium: 139 mmol/L (ref 135–146)

## 2019-06-28 LAB — LIPID PANEL W/REFLEX DIRECT LDL
Cholesterol: 258 mg/dL — ABNORMAL HIGH (ref <200)
HDL: 37 mg/dL — ABNORMAL LOW (ref >=50)
LDL Cholesterol (Calc): 184 mg/dL (calc) — ABNORMAL HIGH
Non-HDL Cholesterol (Calc): 221 mg/dL (calc) — ABNORMAL HIGH (ref <130)
Total CHOL/HDL Ratio: 7 (calc) — ABNORMAL HIGH (ref <5.0)
Triglycerides: 190 mg/dL — ABNORMAL HIGH (ref <150)

## 2019-07-11 ENCOUNTER — Encounter: Payer: Medicare HMO | Admitting: Psychology

## 2019-07-23 ENCOUNTER — Encounter: Payer: Medicare HMO | Admitting: Psychology

## 2019-07-24 ENCOUNTER — Encounter: Payer: Self-pay | Admitting: Neurology

## 2019-07-24 NOTE — Progress Notes (Addendum)
Miangel Voland KeyBallard Russell - [place] Case [id number] - Rx #[id number] Need help? Call us at [phone] Outcome Approvedtoday Your request has been approved Drug Trihexyphenidyl HCl 2MG  tablets Form [employer] Medicare Electronic [place] Form Original Claim Info [id number] [place] MD [id number] REQUIRES PRIOR AUTHORIZATION  Approval good from 05/18/19 to 05/16/20.

## 2019-08-07 ENCOUNTER — Other Ambulatory Visit: Payer: Self-pay

## 2019-08-07 ENCOUNTER — Ambulatory Visit (INDEPENDENT_AMBULATORY_CARE_PROVIDER_SITE_OTHER): Payer: Medicare HMO

## 2019-08-07 ENCOUNTER — Encounter: Payer: Self-pay | Admitting: Podiatry

## 2019-08-07 ENCOUNTER — Ambulatory Visit: Payer: Medicare HMO | Admitting: Podiatry

## 2019-08-07 ENCOUNTER — Telehealth: Payer: Self-pay | Admitting: Podiatry

## 2019-08-07 DIAGNOSIS — M7752 Other enthesopathy of left foot: Secondary | ICD-10-CM

## 2019-08-07 DIAGNOSIS — M7750 Other enthesopathy of unspecified foot: Secondary | ICD-10-CM

## 2019-08-07 DIAGNOSIS — M778 Other enthesopathies, not elsewhere classified: Secondary | ICD-10-CM | POA: Diagnosis not present

## 2019-08-07 NOTE — Patient Instructions (Signed)
Pre-Operative Instructions  Congratulations, you have decided to take an important step towards improving your quality of life.  You can be assured that the doctors and staff at [facility] will be with you every step of the way.  Here are some important things you should know:  1. Plan to be at the surgery center/hospital at least 1 (one) hour prior to your scheduled time, unless otherwise directed by the surgical center/hospital staff.  You must have a responsible adult accompany you, remain during the surgery and drive you home.  Make sure you have directions to the surgical center/hospital to ensure you arrive on time. 2. If you are having surgery at [facility] or [facility] hospitals, you will need a copy of your medical history and physical form from your family physician within one month prior to the date of surgery. We will give you a form for your primary physician to complete.  3. We make every effort to accommodate the date you request for surgery.  However, there are times where surgery dates or times have to be moved.  We will contact you as soon as possible if a change in schedule is required.   4. No aspirin/ibuprofen for one week before surgery.  If you are on aspirin, any non-steroidal anti-inflammatory medications (Mobic, Aleve, Ibuprofen) should not be taken seven (7) days prior to your surgery.  You make take Tylenol for pain prior to surgery.  5. Medications - If you are taking daily heart and blood pressure medications, seizure, reflux, allergy, asthma, anxiety, pain or diabetes medications, make sure you notify the surgery center/hospital before the day of surgery so they can tell you which medications you should take or avoid the day of surgery. 6. No food or drink after midnight the night before surgery unless directed otherwise by surgical center/hospital staff. 7. No alcoholic beverages 24-hours prior to surgery.  No smoking 24-hours prior or 24-hours after  surgery. 8. Wear loose pants or shorts. They should be loose enough to fit over bandages, boots, and casts. 9. Don't wear slip-on shoes. Sneakers are preferred. 10. Bring your boot with you to the surgery center/hospital.  Also bring crutches or a walker if your physician has prescribed it for you.  If you do not have this equipment, it will be provided for you after surgery. 11. If you have not been contacted by the surgery center/hospital by the day before your surgery, call to confirm the date and time of your surgery. 12. Leave-time from work may vary depending on the type of surgery you have.  Appropriate arrangements should be made prior to surgery with your employer. 13. Prescriptions will be provided immediately following surgery by your doctor.  Fill these as soon as possible after surgery and take the medication as directed. Pain medications will not be refilled on weekends and must be approved by the doctor. 14. Remove nail polish on the operative foot and avoid getting pedicures prior to surgery. 15. Wash the night before surgery.  The night before surgery wash the foot and leg well with water and the antibacterial soap provided. Be sure to pay special attention to beneath the toenails and in between the toes.  Wash for at least three (3) minutes. Rinse thoroughly with water and dry well with a towel.  Perform this wash unless told not to do so by your physician.  Enclosed: 1 Ice pack (please put in freezer the night before surgery)   1 Hibiclens skin cleaner     Pre-op instructions  If you have any questions regarding the instructions, please do not hesitate to call our office.  [place]: [address], [place], [place] [address] -- [phone]  [place]: [address] [place], [place] [address] -- [phone]  [place]: [address], [place], [place] [address] -- [phone]   Website: [email or web address] 

## 2019-08-07 NOTE — Telephone Encounter (Signed)
I saw Dr. Milinda Pointer today and I need to schedule a Silver Bunionectomy for both feet. Told pt I had not received her paperwork from [facility] yet and once I got it, I would call her either by the end of the day today or tomorrow to get the surgeries scheduled.

## 2019-08-07 NOTE — Progress Notes (Signed)
She presents today for a chief complaint of pain to the lateral aspect of her left foot.  States that she woke up last Friday in pain she denies any trauma denies any swelling at that time but appears to be swelling now it seems to be worsening.  Painful bunions bilateral.  Objective: Vital signs are stable alert and oriented x3.  Pulses are palpable.  She is complaining of pain around the first metatarsophalangeal joint which is a hypertrophic medial condyle to the head of the first metatarsal bilaterally and she is complaining of pain overlying the sinus tarsi of the left foot.  Radiographs taken today do not demonstrate any type of major osseous abnormalities other than hypertrophy of the medial condyle to the head of the first metatarsals bilaterally.  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  Plan: Discussed etiology pathology and surgical therapies at this point she would like to have the hypertrophic medial condyle condyle resected and a silver osteotomy so we will go ahead and consent her for this.  I consented her and we did discuss the possible postop complications which may include but not limited to postop pain bleeding swelling flexion recurrence need further surgery overcorrection under correction also digit loss of limb loss of life.  Understands this is amenable to it we will follow-up with me in the near future for surgical intervention dispensed information regarding the surgery center [facility] information regarding anesthesia group and instructions for the morning of surgery.  She really did not want to treat the sinus tarsi at this point with an injection though I did offer that she had rather wait until later.  I may ask her if she wants one right before surgery while she is asleep for surgery.

## 2019-08-08 ENCOUNTER — Telehealth: Payer: Self-pay | Admitting: Podiatry

## 2019-08-08 NOTE — Telephone Encounter (Signed)
DOS: 08/24/2019  SURGICAL PROCEDURES: Silver Bunionectomy Bilateral([id number]).  [employer] Medicare Effective 05/17/2017 -  Deductible: $0 Out of Pocket: $5,000 with $100 met and $4,900 remains. CoInsurance: 100%  Facility Copay: $200.  Per [employer] Automated Prior Auth is not required. Ref# [id number]. Per Ria F no prior authorization is required. Call ref# [id number].   

## 2019-08-08 NOTE — Telephone Encounter (Signed)
Called pt to get her scheduled for sx. I got pt scheduled for Friday 04/09 as pt was unable to do this Friday and Dr. Milinda Pointer is out next Friday. I told Ms. Marchetta to go ahead and Warehouse manager via [employer] if she was able to and if not, someone from the sx center would call and get the information they need over the phone. I told her I would get her signed consent forms faxed over to the surgery center. In regards to the time of her sx, I told Ms. Franze someone from the sx center would call a day or two prior to let her know what time to arrive. Told pt to call me with any questions between now and surgery.

## 2019-08-10 DIAGNOSIS — L821 Other seborrheic keratosis: Secondary | ICD-10-CM | POA: Diagnosis not present

## 2019-08-10 DIAGNOSIS — C44519 Basal cell carcinoma of skin of other part of trunk: Secondary | ICD-10-CM | POA: Diagnosis not present

## 2019-08-10 DIAGNOSIS — D1801 Hemangioma of skin and subcutaneous tissue: Secondary | ICD-10-CM | POA: Diagnosis not present

## 2019-08-10 DIAGNOSIS — D485 Neoplasm of uncertain behavior of skin: Secondary | ICD-10-CM | POA: Diagnosis not present

## 2019-08-10 DIAGNOSIS — Z85828 Personal history of other malignant neoplasm of skin: Secondary | ICD-10-CM | POA: Diagnosis not present

## 2019-08-10 DIAGNOSIS — D2261 Melanocytic nevi of right upper limb, including shoulder: Secondary | ICD-10-CM | POA: Diagnosis not present

## 2019-08-10 DIAGNOSIS — L570 Actinic keratosis: Secondary | ICD-10-CM | POA: Diagnosis not present

## 2019-08-10 DIAGNOSIS — L438 Other lichen planus: Secondary | ICD-10-CM | POA: Diagnosis not present

## 2019-08-10 DIAGNOSIS — L814 Other melanin hyperpigmentation: Secondary | ICD-10-CM | POA: Diagnosis not present

## 2019-08-10 DIAGNOSIS — D2272 Melanocytic nevi of left lower limb, including hip: Secondary | ICD-10-CM | POA: Diagnosis not present

## 2019-08-14 ENCOUNTER — Ambulatory Visit (INDEPENDENT_AMBULATORY_CARE_PROVIDER_SITE_OTHER): Payer: Medicare HMO | Admitting: Nurse Practitioner

## 2019-08-14 ENCOUNTER — Other Ambulatory Visit: Payer: Self-pay

## 2019-08-14 ENCOUNTER — Encounter: Payer: Self-pay | Admitting: Nurse Practitioner

## 2019-08-14 VITALS — BP 116/69 | HR 57 | Temp 97.6°F | Ht 65.0 in | Wt 160.3 lb

## 2019-08-14 DIAGNOSIS — H6983 Other specified disorders of Eustachian tube, bilateral: Secondary | ICD-10-CM

## 2019-08-14 NOTE — Patient Instructions (Addendum)
Keep taking the the Zyrtec and Montelukast daily. Start using the Flonase daily. You may also try ibuprofen, which may help decrease inflammation that may be present.  You may also want to try medication for acid reflux for a few days to see if this will decrease the chance of inflammation from gastric reflux.   Eustachian Tube Dysfunction  Eustachian tube dysfunction refers to a condition in which a blockage develops in the narrow passage that connects the middle ear to the back of the nose (eustachian tube). The eustachian tube regulates air pressure in the middle ear by letting air move between the ear and nose. It also helps to drain fluid from the middle ear space. Eustachian tube dysfunction can affect one or both ears. When the eustachian tube does not function properly, air pressure, fluid, or both can build up in the middle ear. What are the causes? This condition occurs when the eustachian tube becomes blocked or cannot open normally. Common causes of this condition include:  Ear infections.  Colds and other infections that affect the nose, mouth, and throat (upper respiratory tract).  Allergies.  Irritation from cigarette smoke.  Irritation from stomach acid coming up into the esophagus (gastroesophageal reflux). The esophagus is the tube that carries food from the mouth to the stomach.  Sudden changes in air pressure, such as from descending in an airplane or scuba diving.  Abnormal growths in the nose or throat, such as: ? Growths that line the nose (nasal polyps). ? Abnormal growth of cells (tumors). ? Enlarged tissue at the back of the throat (adenoids). What increases the risk? You are more likely to develop this condition if:  You smoke.  You are overweight.  You are a child who has: ? Certain birth defects of the mouth, such as cleft palate. ? Large tonsils or adenoids. What are the signs or symptoms? Common symptoms of this condition include:  A feeling of  fullness in the ear.  Ear pain.  Clicking or popping noises in the ear.  Ringing in the ear.  Hearing loss.  Loss of balance.  Dizziness. Symptoms may get worse when the air pressure around you changes, such as when you travel to an area of high elevation, fly on an airplane, or go scuba diving. How is this diagnosed? This condition may be diagnosed based on:  Your symptoms.  A physical exam of your ears, nose, and throat.  Tests, such as those that measure: ? The movement of your eardrum (tympanogram). ? Your hearing (audiometry). How is this treated? Treatment depends on the cause and severity of your condition.  In mild cases, you may relieve your symptoms by moving air into your ears. This is called "popping the ears."  In more severe cases, or if you have symptoms of fluid in your ears, treatment may include: ? Medicines to relieve congestion (decongestants). ? Medicines that treat allergies (antihistamines). ? Nasal sprays or ear drops that contain medicines that reduce swelling (steroids). ? A procedure to drain the fluid in your eardrum (myringotomy). In this procedure, a small tube is placed in the eardrum to:  Drain the fluid.  Restore the air in the middle ear space. ? A procedure to insert a balloon device through the nose to inflate the opening of the eustachian tube (balloon dilation). Follow these instructions at home: Lifestyle  Do not do any of the following until your health care provider approves: ? Travel to high altitudes. ? Fly in airplanes. ? Work  in a pressurized cabin or room. ? Scuba dive.  Do not use any products that contain nicotine or tobacco, such as cigarettes and e-cigarettes. If you need help quitting, ask your health care provider.  Keep your ears dry. Wear fitted earplugs during showering and bathing. Dry your ears completely after. General instructions  Take over-the-counter and prescription medicines only as told by your  health care provider.  Use techniques to help pop your ears as recommended by your health care provider. These may include: ? Chewing gum. ? Yawning. ? Frequent, forceful swallowing. ? Closing your mouth, holding your nose closed, and gently blowing as if you are trying to blow air out of your nose.  Keep all follow-up visits as told by your health care provider. This is important. Contact a health care provider if:  Your symptoms do not go away after treatment.  Your symptoms come back after treatment.  You are unable to pop your ears.  You have: ? A fever. ? Pain in your ear. ? Pain in your head or neck. ? Fluid draining from your ear.  Your hearing suddenly changes.  You become very dizzy.  You lose your balance. Summary  Eustachian tube dysfunction refers to a condition in which a blockage develops in the eustachian tube.  It can be caused by ear infections, allergies, inhaled irritants, or abnormal growths in the nose or throat.  Symptoms include ear pain, hearing loss, or ringing in the ears.  Mild cases are treated with maneuvers to unblock the ears, such as yawning or ear popping.  Severe cases are treated with medicines. Surgery may also be done (rare). This information is not intended to replace advice given to you by your health care provider. Make sure you discuss any questions you have with your health care provider. Document Revised: 08/23/2017 Document Reviewed: 08/23/2017 Elsevier Patient Education  [place].

## 2019-08-14 NOTE — Progress Notes (Addendum)
Acute Office Visit  Subjective:    Patient ID: Michelle Donovan, female    DOB: 1946-04-18, 74 y.o.   MRN: [id number]  Chief Complaint  Patient presents with  . Ear Fullness    Onset:2 wks, bilateral ear fullness, can feel the fluid building up, crackle & pop when swallowing, bothersome when in certain situations, feel like they are full of fluid, denies pain, runny nose, has not been using anything OTC for it    HPI Patient is in today for bilateral fullness, pressure, and popping in her ears for approximately the past 2 weeks.  She also reports an increase in the pressure occasionally when going into stores or sometimes at home and this resolves once she leaves the store.  She reports she does have a constant runny nose for the past several years in addition to her chronic cough however no new cold symptoms that she knows of.  She also reports a constant "hiss" ongoing in her ears however this has been ongoing for several years and not related to the current situation.  She denies any recent colds, fever, chills, sinus pain or pressure or any known allergies.  She does report that she has a history of slight asthma for which she takes Zyrtec and montelukast daily.  She also takes Flonase intermittently.  None of this is seem to help any of her symptoms.  She does have a history of GERD for which she is supposed to take Prilosec.  She reports she does not take this on a regular basis.  She does report that she is having surgery on her feet on April 9 and does not want to start any medication that may interfere with that and wants to make sure that there is no infection prior to this procedure.  Past Medical History:  Diagnosis Date  . Allergic rhinitis, cause unspecified   . Asthma    not bad per pt  . Dermatophytosis of scalp and beard   . Diverticulosis   . Essential and other specified forms of tremor   . Fatty liver   . Fatty liver disease, nonalcoholic 0000000  . GERD  (gastroesophageal reflux disease)   . HTN (hypertension)    history of  . Irritable bowel syndrome   . Lumbago    bulging disk per MRI   . Mixed hyperlipidemia   . Other acute reactions to stress   . Other diseases of lung, not elsewhere classified    solitary pulm. nodule(left)  . PONV (postoperative nausea and vomiting)   . Thyroid nodule   . Unspecified asthma(493.90)   . Unspecified vitamin D deficiency     Past Surgical History:  Procedure Laterality Date  . ABDOMINAL HYSTERECTOMY    . CHOLECYSTECTOMY N/A 08/23/2016   Procedure: LAPAROSCOPIC CHOLECYSTECTOMY;  Surgeon: Clovis Riley, MD;  Location: [facility];  Service: General;  Laterality: N/A;  . COLONOSCOPY    . COLONOSCOPY WITH PROPOFOL N/A 01/26/2019   Procedure: COLONOSCOPY WITH PROPOFOL;  Surgeon: Jonathon Bellows, MD;  Location: [facility] ENDOSCOPY;  Service: Gastroenterology;  Laterality: N/A;  . ESOPHAGOGASTRODUODENOSCOPY (EGD) WITH PROPOFOL N/A 03/09/2017   Procedure: ESOPHAGOGASTRODUODENOSCOPY (EGD) WITH PROPOFOL;  Surgeon: Jonathon Bellows, MD;  Location: [facility] ENDOSCOPY;  Service: Gastroenterology;  Laterality: N/A;  . LAPAROSCOPIC TOTAL HYSTERECTOMY  2002  . LAPAROSCOPIC UNILATERAL SALPINGO OOPHERECTOMY    . TONSILLECTOMY  1967/68  . TUBAL LIGATION  1986    Family History  Problem Relation Age of Onset  . Stroke Mother   .  Tremor Mother   . Diabetes Mother   . Heart failure Mother   . Other Father        Trigeminal neuralgia  . Diabetes Sister   . Coronary artery disease Sister   . Irritable bowel syndrome Sister   . Tremor Sister   . Diabetes Brother        x 3  . Coronary artery disease Brother        x 3  . Irritable bowel syndrome Brother        x 3  . Tremor Brother        x 3  . Breast cancer Other        Aunts and cousin  . Colon cancer Maternal Grandfather     Social History   Socioeconomic History  . Marital status: Divorced    Spouse name: Not on file  . Number of children: 1  . Years of  education: Not on file  . Highest education level: High school graduate  Occupational History  . Occupation: retired    Fish farm manager: OTHER    Comment: [place]: [employer]  . Occupation: part time    Employer: [employer]  Tobacco Use  . Smoking status: Former Smoker    Types: Cigarettes    Quit date: 05/17/1980    Years since quitting: 39.2  . Smokeless tobacco: Never Used  Substance and Sexual Activity  . Alcohol use: No  . Drug use: No  . Sexual activity: Never  Other Topics Concern  . 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 Difficulty of Paying Living Expenses:   Food Insecurity:   . Worried About Charity fundraiser in the Last Year:   . Arboriculturist in the Last Year:   Transportation Needs:   . Film/video editor (Medical):   Marland Kitchen Lack of Transportation (Non-Medical):   Physical Activity: Inactive  . Days of Exercise per Week: 0 days  . Minutes of Exercise per Session: 0 min  Stress:   . Feeling of Stress :   Social Connections:   . Frequency of Communication with Friends and Family:   . Frequency of Social Gatherings with Friends and Family:   . Attends Religious Services:   . Active Member of Clubs or Organizations:   . Attends Archivist Meetings:   Marland Kitchen Marital Status:   Intimate Partner Violence:   . Fear of Current or Ex-Partner:   . Emotionally Abused:   Marland Kitchen Physically Abused:   . Sexually Abused:     Outpatient Medications Prior to Visit  Medication Sig Dispense Refill  . cetirizine (ZYRTEC) 10 MG tablet Take 10 mg by mouth daily.    . clonazePAM (KLONOPIN) 0.5 MG tablet TAKE 1 TAB BY MOUTH EVERY MORNING AND HALF A TAB EVERY NIGHT 135 tablet 1  . montelukast (SINGULAIR) 10 MG tablet TAKE 1 TABLET BY MOUTH EVERYDAY AT  BEDTIME 90 tablet 0  . propranolol (INDERAL) 60 MG tablet TAKE 1 TABLET (60 MG TOTAL) BY MOUTH 2 (TWO) TIMES DAILY. 180 tablet 0  . trihexyphenidyl (ARTANE) 2 MG tablet TAKE 1 TABLET (2  MG TOTAL) BY MOUTH AT BEDTIME. (Patient taking differently: Take 1 mg by mouth at bedtime. ) 90 tablet 1  . valACYclovir (VALTREX) 1000 MG tablet Take 2 tabs every 12 hours x 1 day as needed for cold sores 20 tablet 0  . VITAMIN D PO Take by mouth.     No facility-administered medications prior to visit.    Allergies  Allergen Reactions  . Tylenol [Acetaminophen] Other (See Comments)    Increased liver enzymes  . Codeine Nausea And Vomiting  . Topamax [Topiramate] Other (See Comments)    Hair loss    Review of Systems  Constitutional: Negative for appetite change, chills, fatigue and fever.  HENT: Positive for hearing loss, postnasal drip, rhinorrhea and tinnitus. Negative for congestion, ear discharge, ear pain, facial swelling, nosebleeds, sinus pressure, sinus pain, sneezing, sore throat, trouble swallowing and voice change.   Eyes: Negative for pain, discharge, redness and itching.  Respiratory: Positive for cough. Negative for chest tightness and shortness of breath.   Cardiovascular: Negative for chest pain, palpitations and leg swelling.  Allergic/Immunologic: Negative for environmental allergies and food allergies.  Neurological: Positive for tremors. Negative for dizziness, syncope, weakness, light-headedness and headaches.  Psychiatric/Behavioral: Negative for sleep disturbance.       Objective:    Physical Exam Vitals and nursing note reviewed.  Constitutional:      Appearance: Normal appearance.  HENT:     Head: Normocephalic.     Right Ear: External ear normal. No decreased hearing noted. No drainage, swelling or tenderness. A middle ear effusion is present. There is no impacted cerumen. No foreign body. No mastoid tenderness. Tympanic membrane is not injected, scarred, perforated,  erythematous or retracted.     Left Ear: External ear normal. No decreased hearing noted. No drainage, swelling or tenderness. A middle ear effusion is present. There is no impacted cerumen. No foreign body. No mastoid tenderness. Tympanic membrane is not injected, scarred, perforated, erythematous or retracted.     Nose: Rhinorrhea present.     Right Turbinates: Not enlarged, swollen or pale.     Left Turbinates: Not enlarged, swollen or pale.     Right Sinus: No maxillary sinus tenderness or frontal sinus tenderness.     Left Sinus: No maxillary sinus tenderness or frontal sinus tenderness.     Mouth/Throat:     Mouth: Mucous membranes are moist.     Pharynx: Oropharynx is clear. No oropharyngeal exudate or posterior oropharyngeal erythema.  Eyes:     Extraocular Movements: Extraocular movements intact.     Conjunctiva/sclera: Conjunctivae normal.     Pupils: Pupils are equal, round, and reactive to light.  Cardiovascular:     Rate and Rhythm: Normal rate and regular rhythm.     Pulses: Normal pulses.     Heart sounds: Normal heart sounds.  Pulmonary:     Effort: Pulmonary effort is normal.     Breath sounds: Normal breath sounds.  Abdominal:     General: Abdomen is flat. Bowel sounds are normal.     Palpations: Abdomen is soft.  Musculoskeletal:        General: Normal range of motion.     Cervical back: Normal range of motion.     Right lower leg: No edema.     Left lower leg: No edema.  Lymphadenopathy:     Head:     Right side of head: No submental, tonsillar, preauricular or posterior auricular adenopathy.     Left side of head: Posterior  auricular adenopathy present. No submental, tonsillar or preauricular adenopathy.  Skin:    General: Skin is warm and dry.     Capillary Refill: Capillary refill takes less than 2 seconds.  Neurological:     General: No focal deficit present.     Mental Status: She is alert and oriented to person, place, and time.  Psychiatric:         Mood and Affect: Mood normal.        Behavior: Behavior normal.        Thought Content: Thought content normal.        Judgment: Judgment normal.     BP 116/69   Pulse (!) 57   Temp 97.6 F (36.4 C) (Oral)   Ht 5\' 5"  (1.651 m)   Wt 160 lb 4.8 oz (72.7 kg)   SpO2 96%   BMI 26.68 kg/m  Wt Readings from Last 3 Encounters:  08/14/19 160 lb 4.8 oz (72.7 kg)  06/01/19 154 lb (69.9 kg)  02/26/19 155 lb 3.2 oz (70.4 kg)    Health Maintenance Due  Topic Date Due  . PNA vac Low Risk Adult (1 of 2 - PCV13) Never done  . Fecal DNA (Cologuard)  02/18/2019    There are no preventive care reminders to display for this patient.   Lab Results  Component Value Date   TSH 0.95 12/25/2018   Lab Results  Component Value Date   WBC 6.6 12/25/2018   HGB 12.6 12/25/2018   HCT 39.0 12/25/2018   MCV 88.6 12/25/2018   PLT 195.0 12/25/2018   Lab Results  Component Value Date   NA 139 06/28/2019   K 4.7 06/28/2019   CO2 28 06/28/2019   GLUCOSE 110 (H) 06/28/2019   BUN 10 06/28/2019   CREATININE 0.78 06/28/2019   BILITOT 0.3 12/25/2018   ALKPHOS 109 12/25/2018   AST 32 12/25/2018   ALT 42 (H) 12/25/2018   PROT 6.5 12/25/2018   ALBUMIN 3.8 12/25/2018   CALCIUM 9.3 06/28/2019   ANIONGAP 9 05/13/2018   GFR 78.59 12/25/2018   Lab Results  Component Value Date   CHOL 258 (H) 06/28/2019   Lab Results  Component Value Date   HDL 37 (L) 06/28/2019   Lab Results  Component Value Date   LDLCALC 184 (H) 06/28/2019   Lab Results  Component Value Date   TRIG 190 (H) 06/28/2019   Lab Results  Component Value Date   CHOLHDL 7.0 (H) 06/28/2019   Lab Results  Component Value Date   HGBA1C 6.1 12/25/2018       Assessment & Plan:   1. ETD (Eustachian tube dysfunction), bilateral Symptoms and presentation consistent with bilateral eustachian tube dysfunction of unknown etiology. This time there is no signs or symptoms of infection therefore feel that conservative management  is appropriate. Formation provided on eustachian tube dysfunction as well as over-the-counter management to help with symptoms.  Patient encouraged to continue Zyrtec montelukast and Flonase daily.  Recommendation for the patient to also take something daily for GERD symptoms to see if this could be the cause of eustachian inflammation.  She may additionally take ibuprofen for for pressure and to possibly help with inflammation.  I did explain to the patient that the symptoms may not resolve for up to 12 weeks however if symptoms last longer than that we can send her to ENT for evaluation.  Discussed with the patient that if she begins to develop pain fever sinus pain or pressure  sore throat or any other signs of infection to notify me immediately.  Even that she is having surgery within the next 2 weeks I have a low threshold for starting oral antibiotics to ensure that this does not interfere with the surgical procedure. Patient to follow-up if symptoms worsen or fail to improve.  Return if symptoms worsen or fail to improve.  Orma Render, NP

## 2019-08-16 HISTORY — PX: BUNIONECTOMY: SHX129

## 2019-08-21 ENCOUNTER — Encounter: Payer: Self-pay | Admitting: Family Medicine

## 2019-08-22 ENCOUNTER — Telehealth (INDEPENDENT_AMBULATORY_CARE_PROVIDER_SITE_OTHER): Payer: Medicare HMO | Admitting: Physician Assistant

## 2019-08-22 ENCOUNTER — Encounter: Payer: Self-pay | Admitting: Physician Assistant

## 2019-08-22 VITALS — Temp 96.9°F | Ht 65.0 in | Wt 160.0 lb

## 2019-08-22 DIAGNOSIS — R439 Unspecified disturbances of smell and taste: Secondary | ICD-10-CM | POA: Diagnosis not present

## 2019-08-22 DIAGNOSIS — J029 Acute pharyngitis, unspecified: Secondary | ICD-10-CM

## 2019-08-22 DIAGNOSIS — R0981 Nasal congestion: Secondary | ICD-10-CM | POA: Diagnosis not present

## 2019-08-22 DIAGNOSIS — R432 Parageusia: Secondary | ICD-10-CM | POA: Diagnosis not present

## 2019-08-22 DIAGNOSIS — Z20822 Contact with and (suspected) exposure to covid-19: Secondary | ICD-10-CM | POA: Diagnosis not present

## 2019-08-22 DIAGNOSIS — M21619 Bunion of unspecified foot: Secondary | ICD-10-CM | POA: Insufficient documentation

## 2019-08-22 NOTE — Progress Notes (Signed)
Patient ID: Michelle Donovan, female   DOB: [birth year], 74 y.o.   MRN: [id number] .Marland KitchenVirtual Visit via Video Note  I connected with Michelle Donovan on 08/22/19 at  9:50 AM EDT by a video enabled telemedicine application and verified that I am speaking with the correct person using two identifiers.  Location: Patient: home Provider: clinic   I discussed the limitations of evaluation and management by telemedicine and the availability of in person appointments. The patient expressed understanding and agreed to proceed.  History of Present Illness: Pt is a 74 yo female with asthma and HTN who calls into the clinic with ST, loss of taste, nasal congestion for 3 days. She has surgery for bunionectomy bilateral on Friday. Denies any other sick contacts. She can still smell. No fever, chills, body aches, SOB, GI symptoms. Not taking anything to make better.   .. Active Ambulatory Problems    Diagnosis Date Noted  . Vitamin D deficiency 09/19/2008  . HYPERLIPIDEMIA 02/06/2007  . Situational anxiety 03/06/2009  . Benign essential hypertension 07/17/2009  . Allergic rhinitis 02/06/2007  . Asthma, mild persistent 02/06/2007  . 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 IRRITABLE BOWEL SYNDROME 02/06/2007  . LOW BACK PAIN, CHRONIC 02/06/2007  . B12 deficiency 10/26/2010  . Varicosities of leg 07/07/2011  . Ulnar nerve compression 12/07/2012  . Constipation, chronic 04/17/2013  . Essential tremor 07/19/2013  . Bilateral arm pain 08/08/2013  . Multiple thyroid nodules 08/29/2013  . Elevated LFTs 10/05/2013  . Fatigue 11/05/2014  . Insomnia 11/05/2014  . 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 Dysphagia 02/01/2017  . ETD (Eustachian tube dysfunction), bilateral 10/04/2017  . Epistaxis 11/16/2017  . Right-sided chest wall pain 03/07/2018  . Edema of left ankle 07/03/2018  . Leg pain 07/21/2018  . History of basal cell cancer 06/01/2019   Resolved Ambulatory Problems   Diagnosis Date Noted  . Myalgia 10/26/2010  . Weight loss 10/26/2010  . Elbow pain 11/17/2010  . Leg swelling 07/07/2011  . ETD (eustachian tube dysfunction) 08/04/2011  . Skin lesion of left leg 09/08/2011  . 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 Rash and nonspecific skin eruption 03/14/2012  . Chronic cough 12/07/2012  . Other malaise and fatigue 12/07/2012  . Urinary urgency 12/07/2012  . Abdominal pain, epigastric 04/17/2013  . Acute neck pain 08/08/2013  . Fatty liver 10/05/2013  . Conjunctivitis 10/05/2013  . Nasal congestion 11/23/2013  . Alopecia 02/08/2014  . Eustachian tube dysfunction 05/07/2014  . Viral URI 05/07/2014  . Acute right hip pain 07/09/2014  . Bilateral arm weakness 11/05/2014  . Influenza 08/13/2015  . Acute diverticulitis 09/22/2015  . Gallstones 07/20/2016  . Pain in or around eye, left 08/02/2016  . Ear fullness, right 09/14/2018   Past Medical History:  Diagnosis Date  . Allergic rhinitis, cause unspecified   . Asthma   . Dermatophytosis of scalp and beard   . Diverticulosis   . Essential and other specified forms of tremor   . Fatty liver disease, nonalcoholic 0000000  . GERD (gastroesophageal reflux disease)   . HTN (hypertension)   . Other acute reactions to stress   . PONV (postoperative nausea and vomiting)   . Thyroid nodule   . Unspecified asthma(493.90)   . Unspecified vitamin D deficiency    Reviewed med, allergy, problem list.     Observations/Objective: No acute distress. Dry cough no labored breathing.  Normal mood and appearance.   .. Today's Vitals   08/22/19 [id number]  Temp: Marland Kitchen)  96.9 F (36.1 C)  TempSrc: Temporal  Weight: 160 lb (72.6 kg)  Height: 5\' 5"  (1.651 m)   Body mass index is 26.63 kg/m.    Assessment and Plan: Marland KitchenMarland KitchenDiagnoses and all orders for this visit:  Loss of taste  Sore throat  Nasal congestion   Concern for covid infection and for planning purposes of foot surgery needs covid testing. Self isolate until  results. Surgery scheduled Friday morning. She really needs rapid. Call [facility] to get tested today. Discussed symptomatic care for viral illness/allergies.continue flonase and ok for tylenol cold sinus severe. No red flags today. Follow up as needed.    Follow Up Instructions:    I discussed the assessment and treatment plan with the patient. The patient was provided an opportunity to ask questions and all were answered. The patient agreed with the plan and demonstrated an understanding of the instructions.   The patient was advised to call back or seek an in-person evaluation if the symptoms worsen or if the condition fails to improve as anticipated.  I provided 12 minutes of non-face-to-face time during this encounter.   Iran Planas, PA-C

## 2019-08-22 NOTE — Progress Notes (Signed)
Started Monday AM Congestion Scratchy feeling in throat/nasal passages Lost taste with some things Can still smell No fever No sick contacts No OTC meds  Scheduled for foot surgery on Friday this week They haven't mentioned Covid testing prior to surgery

## 2019-08-23 ENCOUNTER — Other Ambulatory Visit: Payer: Self-pay | Admitting: Podiatry

## 2019-08-23 MED ORDER — ONDANSETRON HCL 4 MG PO TABS: 4.0000 mg | ORAL_TABLET | Freq: Three times a day (TID) | ORAL | 0 refills | Status: DC | PRN

## 2019-08-23 MED ORDER — OXYCODONE-ACETAMINOPHEN 10-325 MG PO TABS: 1.0000 | ORAL_TABLET | Freq: Three times a day (TID) | ORAL | 0 refills | Status: AC | PRN

## 2019-08-23 MED ORDER — CEPHALEXIN 500 MG PO CAPS: 500.0000 mg | ORAL_CAPSULE | Freq: Two times a day (BID) | ORAL | 0 refills | Status: DC

## 2019-08-23 NOTE — Progress Notes (Signed)
Virtual Visit Via Video   The purpose of this virtual visit is to provide medical care while limiting exposure to the novel coronavirus.    Consent was obtained for video visit:  Yes.   Answered questions that patient had about telehealth interaction:  Yes.   I discussed the limitations, risks, security and privacy concerns of performing an evaluation and management service by telemedicine. I also discussed with the patient that there may be a patient responsible charge related to this service. The patient expressed understanding and agreed to proceed.  Pt location: Home Physician Location: office Name of referring provider:  Jinny Sanders, MD I connected with [name] at patients initiation/request on 08/27/2019 at  2:00 PM EDT by video enabled telemedicine application and verified that I am speaking with the correct person using two identifiers. Pt MRN:  [id number] Pt DOB:  [birth year] Video Participants:  [name];    Assessment/Plan:   1.  Essential tremor  -Continue propranolol, 60 mg twice per day.  Monitoring pulse/bradycardia.  She is asymptomatic.  -Continue trihexyphenidyl, 1 mg (1/2 tablet) in the AM for the next 6 weeks and then we will discontinue it because she wants to retry the topiramate.  Higher dosages had SE  -Patient really would like to retry the topiramate.  We went back through her history.  She tried in 2015/2016 with success, but had hair loss with it.  It looks like we retried it later and went up to 200 mg, but had the hair loss and she did not think it was as effective.  Regardless, she really would like to retry it again.  We talked about the supplements that she could use for hair loss/paresthesias.  I took it off her allergy list, because she did not have an allergy, she just had side effects.  I will send her an email with the titration schedule.  She will start at 25 mg for a week and then slowly, over the next 4 weeks work up to 100 mg  daily.  R/B/SE were discussed.  The opportunity to ask questions was given and they were answered to the best of my ability.  The patient expressed understanding and willingness to follow the outlined treatment protocols.  -Continue clonazepam 0.5 mg, 1 tablet in the morning and half tablet at night.  2.  B12 deficiency  -was On supplementation but off of it now.  Will recheck in the future (she isn't in the office today).  3.  Hx NASH  -Was determined due to poor diet.  4.  Sinus disease  -mentioned that her L sphenoid sinus had complete opacification on last CT.  She is asymptomatic.  She can follow-up with primary care.  Subjective   Patient seen today in follow-up for essential tremor.  She tried to wean off of the klonopin but got worse in terms of tremor. Thinks tremor worse lately.  Patient emailed me since last visit.  She was concerned about memory change.  Neurocognitive testing was scheduled, but the patient ended up canceling that.  She reports that she held on that because she decided that it was just a part of normal aging (after talking with friends).  She did have a fall in her bathtub, for which we ended up doing a CT of the brain in January.  I personally reviewed that.  Intracranially, it was normal.  They did report chronic inflammation/near complete opacification of the left sphenoid sinus. She does state that  she was standing on the edge of the bathtub trying to hang a shower curtain when she fell.  She didn't get seriously hurt.   She did have bunyion surgery since our last visit and that was just done and that is why she didn't come in today.  Current movement d/o meds:  propranolol, 60 mg twice per day.    trihexyphenidyl, 1 mg (1/2 tablet) in the AM clonazepam 0.5 mg, 1 tablet in the morning and half tablet at night.  Current/Previously tried tremor medications: topamax;  Metoprolol - felt drained ; primidone (50 mg - 1/2 at night but got first dose effect and stopped  it); meclizine (? Why); on propranolol now - ? Helping now; gabapentin; artane   Current Outpatient Medications on File Prior to Visit  Medication Sig Dispense Refill  . cephALEXin (KEFLEX) 500 MG capsule Take 1 capsule (500 mg total) by mouth 2 (two) times daily. 20 capsule 0  . cetirizine (ZYRTEC) 10 MG tablet Take 10 mg by mouth daily.    . clonazePAM (KLONOPIN) 0.5 MG tablet TAKE 1 TAB BY MOUTH EVERY MORNING AND HALF A TAB EVERY NIGHT 135 tablet 1  . montelukast (SINGULAIR) 10 MG tablet TAKE 1 TABLET BY MOUTH EVERYDAY AT BEDTIME 90 tablet 0  . ondansetron (ZOFRAN) 4 MG tablet Take 1 tablet (4 mg total) by mouth every 8 (eight) hours as needed. 20 tablet 0  . oxyCODONE-acetaminophen (PERCOCET) 10-325 MG tablet Take 1 tablet by mouth every 8 (eight) hours as needed for up to 7 days for pain. 21 tablet 0  . propranolol (INDERAL) 60 MG tablet TAKE 1 TABLET (60 MG TOTAL) BY MOUTH 2 (TWO) TIMES DAILY. 180 tablet 0  . trihexyphenidyl (ARTANE) 2 MG tablet TAKE 1 TABLET (2 MG TOTAL) BY MOUTH AT BEDTIME. (Patient taking differently: Take 1 mg by mouth at bedtime. ) 90 tablet 1  . valACYclovir (VALTREX) 1000 MG tablet Take 2 tabs every 12 hours x 1 day as needed for cold sores 20 tablet 0  . VITAMIN D PO Take by mouth.     No current facility-administered medications on file prior to visit.     Objective   Vitals:   08/27/19 1200  Weight: 160 lb (72.6 kg)  Height: 5\' 5"  (1.651 m)   GEN:  The patient appears stated age and is in NAD.  Neurological examination:  Orientation: The patient is alert and oriented x3. Cranial nerves: There is good facial symmetry. There is nofacial hypomimia.  The speech is fluent and clear. Soft palate rises symmetrically and there is no tongue deviation. Hearing is intact to conversational tone. Motor: Strength is at least antigravity x 4.   Shoulder shrug is equal and symmetric.  There is no pronator drift.  Movement examination: Tone: unable Abnormal  movements: mild tremor of outstretched hands Coordination:  There is no decremation with RAM's Gait and Station: not tested d/t recent feet sx bilaterally    Follow up Instructions      -I discussed the assessment and treatment plan with the patient. The patient was provided an opportunity to ask questions and all were answered. The patient agreed with the plan and demonstrated an understanding of the instructions.   The patient was advised to call back or seek an in-person evaluation if the symptoms worsen or if the condition fails to improve as anticipated.    Total time spent on today's visit was 30 minutes, including both face-to-face time and nonface-to-face time.  Time included  that spent on review of records (prior notes available to me/labs/imaging if pertinent), discussing treatment and goals, answering patient's questions and coordinating care.   Alonza Bogus, DO

## 2019-08-24 ENCOUNTER — Encounter: Payer: Self-pay | Admitting: Podiatry

## 2019-08-24 DIAGNOSIS — M2011 Hallux valgus (acquired), right foot: Secondary | ICD-10-CM | POA: Diagnosis not present

## 2019-08-24 DIAGNOSIS — I10 Essential (primary) hypertension: Secondary | ICD-10-CM | POA: Diagnosis not present

## 2019-08-24 DIAGNOSIS — M2012 Hallux valgus (acquired), left foot: Secondary | ICD-10-CM | POA: Diagnosis not present

## 2019-08-27 ENCOUNTER — Encounter: Payer: Self-pay | Admitting: Neurology

## 2019-08-27 ENCOUNTER — Other Ambulatory Visit: Payer: Self-pay

## 2019-08-27 ENCOUNTER — Telehealth (INDEPENDENT_AMBULATORY_CARE_PROVIDER_SITE_OTHER): Payer: Medicare HMO | Admitting: Neurology

## 2019-08-27 VITALS — Ht 65.0 in | Wt 160.0 lb

## 2019-08-27 DIAGNOSIS — G250 Essential tremor: Secondary | ICD-10-CM

## 2019-08-27 DIAGNOSIS — E538 Deficiency of other specified B group vitamins: Secondary | ICD-10-CM

## 2019-08-27 MED ORDER — TOPIRAMATE 100 MG PO TABS: 100.0000 mg | ORAL_TABLET | Freq: Two times a day (BID) | ORAL | 1 refills | Status: DC

## 2019-08-27 MED ORDER — TOPIRAMATE 25 MG PO TABS: ORAL_TABLET | ORAL | 0 refills | Status: DC

## 2019-08-30 ENCOUNTER — Encounter: Payer: Self-pay | Admitting: Podiatry

## 2019-08-30 ENCOUNTER — Ambulatory Visit (INDEPENDENT_AMBULATORY_CARE_PROVIDER_SITE_OTHER): Payer: Medicare HMO

## 2019-08-30 ENCOUNTER — Other Ambulatory Visit: Payer: Self-pay

## 2019-08-30 ENCOUNTER — Ambulatory Visit (INDEPENDENT_AMBULATORY_CARE_PROVIDER_SITE_OTHER): Payer: Medicare HMO | Admitting: Podiatry

## 2019-08-30 DIAGNOSIS — M2012 Hallux valgus (acquired), left foot: Secondary | ICD-10-CM

## 2019-08-30 DIAGNOSIS — Z9889 Other specified postprocedural states: Secondary | ICD-10-CM

## 2019-08-30 DIAGNOSIS — M2011 Hallux valgus (acquired), right foot: Secondary | ICD-10-CM

## 2019-08-30 NOTE — Progress Notes (Signed)
She presents today for her first postop visit she is status post silver bunionectomies bilaterally.  States that she only had to take 1 pain pill.  She denies fever chills nausea vomiting muscle aches pains calf pain back pain chest pain shortness of breath.  Objective: Vital signs are stable alert and oriented x3.  Pulses are palpable.  Neurologic sensorium is intact deep tendon reflexes are intact muscle strength is normal symmetrical.  Minimal edema to forefoot bilaterally incision sites are healing very nicely radiographs demonstrate rectus first metatarsophalangeal joints with silver bunionectomies.  Assessment: Well-healing surgical foot bilateral.  Plan: Redressed today dressed a compressive dressing follow-up with her in 1 week for suture removal.

## 2019-08-31 ENCOUNTER — Other Ambulatory Visit: Payer: Self-pay | Admitting: Family Medicine

## 2019-09-06 ENCOUNTER — Other Ambulatory Visit: Payer: Self-pay

## 2019-09-06 ENCOUNTER — Ambulatory Visit (INDEPENDENT_AMBULATORY_CARE_PROVIDER_SITE_OTHER): Payer: Medicare HMO | Admitting: Podiatry

## 2019-09-06 DIAGNOSIS — Z9889 Other specified postprocedural states: Secondary | ICD-10-CM

## 2019-09-06 DIAGNOSIS — M2011 Hallux valgus (acquired), right foot: Secondary | ICD-10-CM

## 2019-09-06 DIAGNOSIS — M2012 Hallux valgus (acquired), left foot: Secondary | ICD-10-CM

## 2019-09-06 NOTE — Progress Notes (Signed)
She presents today postop visit #2 date of surgery 08/24/2019 silver bunionectomy bilateral.  Patient states that she has had improvement has had some burning sensation on the right foot at the incision site only.  Denies fever chills nausea vomiting muscle aches pains calf pain back pain chest pain shortness of breath.  Objective: Vital signs are stable alert oriented x3 dressed with dressing intact was removed demonstrates minimal edema no erythema cellulitis drainage or odor incision sites are gone and coapted very well.  She has great range of motion of the first metatarsophalangeal joints.  I remove the ends of the stitches today margins remain well coapted no signs of infection.  Assessment: Well-healing surgical foot bilateral.  Plan: Placed her in a compression anklet and I will follow-up with her in 2 weeks and allow her to get back to her regular shoes and then about the x-ray the next and she comes in.

## 2019-09-14 ENCOUNTER — Encounter: Payer: Self-pay | Admitting: Family Medicine

## 2019-09-20 ENCOUNTER — Ambulatory Visit (INDEPENDENT_AMBULATORY_CARE_PROVIDER_SITE_OTHER): Payer: Medicare HMO | Admitting: Podiatry

## 2019-09-20 ENCOUNTER — Encounter: Payer: Self-pay | Admitting: Podiatry

## 2019-09-20 ENCOUNTER — Other Ambulatory Visit: Payer: Self-pay | Admitting: Neurology

## 2019-09-20 ENCOUNTER — Other Ambulatory Visit: Payer: Self-pay

## 2019-09-20 DIAGNOSIS — M2012 Hallux valgus (acquired), left foot: Secondary | ICD-10-CM

## 2019-09-20 DIAGNOSIS — M2011 Hallux valgus (acquired), right foot: Secondary | ICD-10-CM

## 2019-09-20 DIAGNOSIS — Z9889 Other specified postprocedural states: Secondary | ICD-10-CM

## 2019-09-20 NOTE — Progress Notes (Signed)
She presents today postop visit #3 date of surgery August 24, 2019 status post silver bunionectomies bilaterally. States that she is doing quite well other than some stiffness and tenderness.  Objective: Vital signs are stable she is alert oriented x3 she has soreness on range of motion of the first metatarsophalangeal joint right more so than that of the left. Mild edema no erythema cellulitis drainage or odor incision sites are gone on to heal uneventfully no open lesions or wounds are noted.  Assessment: Silver bunionectomies healing well.  Plan: I encouraged massage therapy and range of motion therapy I recommended physical therapy but she declined I will follow-up with her in 1 month

## 2019-10-01 ENCOUNTER — Encounter: Payer: Self-pay | Admitting: Family Medicine

## 2019-10-01 ENCOUNTER — Ambulatory Visit (INDEPENDENT_AMBULATORY_CARE_PROVIDER_SITE_OTHER): Payer: Medicare HMO | Admitting: Family Medicine

## 2019-10-01 ENCOUNTER — Other Ambulatory Visit: Payer: Self-pay

## 2019-10-01 VITALS — BP 122/58 | HR 65 | Ht 65.0 in | Wt 153.0 lb

## 2019-10-01 DIAGNOSIS — I10 Essential (primary) hypertension: Secondary | ICD-10-CM

## 2019-10-01 DIAGNOSIS — J4530 Mild persistent asthma, uncomplicated: Secondary | ICD-10-CM | POA: Diagnosis not present

## 2019-10-01 DIAGNOSIS — Z1211 Encounter for screening for malignant neoplasm of colon: Secondary | ICD-10-CM | POA: Diagnosis not present

## 2019-10-01 DIAGNOSIS — R1011 Right upper quadrant pain: Secondary | ICD-10-CM | POA: Diagnosis not present

## 2019-10-01 NOTE — Progress Notes (Signed)
Established Patient Office Visit  Subjective:  Patient ID: Michelle Donovan, female    DOB: February 09, 1946  Age: 74 y.o. MRN: [id number]  CC:  Chief Complaint  Patient presents with  . Hypertension  . Abdominal Pain    RUQ x 3 mos    HPI Michelle Donovan presents for   Hypertension- Pt denies chest pain, SOB, dizziness, or heart palpitations.  Taking meds as directed w/o problems.  Denies medication side effects.   F/U Asthma - she is doing well. No recent flares. Has constant runny nose.  Takes her Singulair regularly including an oral antihistamine and uses her Flonase.  She would like me to check her ears today she feels a lot of pressure most likely have fluid in them.  She recently restart topiramate with Neurology for her tremor.  Up to 75mg  on a taper upward.    Has had a pain under her right breast.  Goes around to her spine just below her bra strap. .Says can last for hours but can go away with sitting and resting. Worse with activity. She is usually cold but occ will feel like a hot flash.  Says she has been gassy.  Says if eats a full meal she will get heartburn.  GB removed.  Gets easily tired.  Hx of fatty liver.   She reports that the pain occurs almost every day at some point.  And worsening with eating or not eating.  She has not tried any Tylenol or ibuprofen etc. to see if this would help.  Past Medical History:  Diagnosis Date  . Allergic rhinitis, cause unspecified   . Asthma    not bad per pt  . Dermatophytosis of scalp and beard   . Diverticulosis   . Essential and other specified forms of tremor   . Fatty liver   . Fatty liver disease, nonalcoholic 0000000  . GERD (gastroesophageal reflux disease)   . HTN (hypertension)    history of  . Irritable bowel syndrome   . Lumbago    bulging disk per MRI   . Mixed hyperlipidemia   . Other acute reactions to stress   . Other diseases of lung, not elsewhere classified    solitary pulm. nodule(left)  . PONV  (postoperative nausea and vomiting)   . Thyroid nodule   . Unspecified asthma(493.90)   . Unspecified vitamin D deficiency     Past Surgical History:  Procedure Laterality Date  . ABDOMINAL HYSTERECTOMY    . CHOLECYSTECTOMY N/A 08/23/2016   Procedure: LAPAROSCOPIC CHOLECYSTECTOMY;  Surgeon: Clovis Riley, MD;  Location: [facility];  Service: General;  Laterality: N/A;  . COLONOSCOPY    . COLONOSCOPY WITH PROPOFOL N/A 01/26/2019   Procedure: COLONOSCOPY WITH PROPOFOL;  Surgeon: Jonathon Bellows, MD;  Location: [facility] ENDOSCOPY;  Service: Gastroenterology;  Laterality: N/A;  . ESOPHAGOGASTRODUODENOSCOPY (EGD) WITH PROPOFOL N/A 03/09/2017   Procedure: ESOPHAGOGASTRODUODENOSCOPY (EGD) WITH PROPOFOL;  Surgeon: Jonathon Bellows, MD;  Location: [facility] ENDOSCOPY;  Service: Gastroenterology;  Laterality: N/A;  . LAPAROSCOPIC TOTAL HYSTERECTOMY  2002  . LAPAROSCOPIC UNILATERAL SALPINGO OOPHERECTOMY    . TONSILLECTOMY  1967/68  . TUBAL LIGATION  1986    Family History  Problem Relation Age of Onset  . Stroke Mother   . Tremor Mother   . Diabetes Mother   . Heart failure Mother   . Other Father        Trigeminal neuralgia  . Diabetes Sister   . Coronary artery disease Sister   .  Irritable bowel syndrome Sister   . Tremor Sister   . Diabetes Brother        x 3  . Coronary artery disease Brother        x 3  . Irritable bowel syndrome Brother        x 3  . Tremor Brother        x 3  . Breast cancer Other        Aunts and cousin  . Colon cancer Maternal Grandfather     Social History   Socioeconomic History  . Marital status: Divorced    Spouse name: Not on file  . Number of children: 1  . Years of education: Not on file  . Highest education level: High school graduate  Occupational History  . Occupation: retired    Fish farm manager: OTHER    Comment: [place]: [place]  . Occupation: part time    Employer: [employer]  Tobacco Use  . Smoking status: Former Smoker     Types: Cigarettes    Quit date: 05/17/1980    Years since quitting: 39.4  . Smokeless tobacco: Never Used  Substance and Sexual Activity  . Alcohol use: No  . Drug use: No  . Sexual activity: Never  Other Topics Concern  . 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 Difficulty of Paying Living Expenses:   Food Insecurity:   . Worried About Charity fundraiser in the Last Year:   . Arboriculturist in the Last Year:   Transportation Needs:   . Film/video editor (Medical):   Marland Kitchen Lack of Transportation (Non-Medical):   Physical Activity: Inactive  . Days of Exercise per Week: 0 days  . Minutes of Exercise per Session: 0 min  Stress:   . Feeling of Stress :   Social Connections:   . Frequency of Communication with Friends and Family:   . Frequency of Social Gatherings with Friends and Family:   . Attends Religious Services:   . Active Member of Clubs or Organizations:   . Attends Archivist Meetings:   Marland Kitchen Marital Status:   Intimate Partner Violence:   . Fear of Current or Ex-Partner:   . Emotionally Abused:   Marland Kitchen Physically Abused:   . Sexually Abused:     Outpatient Medications Prior to Visit  Medication Sig Dispense Refill  . cetirizine (ZYRTEC) 10 MG tablet Take 10 mg by mouth daily.    . clonazePAM (KLONOPIN) 0.5 MG tablet TAKE 1 TAB BY MOUTH EVERY MORNING AND HALF A TAB EVERY NIGHT 135 tablet 1  . montelukast (SINGULAIR) 10 MG tablet TAKE 1 TABLET BY MOUTH EVERYDAY AT BEDTIME 90 tablet 0  . propranolol (INDERAL) 60 MG tablet TAKE 1 TABLET (60 MG TOTAL) BY MOUTH 2 (TWO) TIMES DAILY. 180 tablet 0  . topiramate (TOPAMAX) 100 MG tablet Take 1 tablet (100 mg total) by mouth 2 (two) times daily. 90 tablet 1  . topiramate (TOPAMAX) 25 MG tablet 1 tablet qd x 1 week, then  2 tabs qd x 1 week, then 3 tabs daily 42 tablet 0  . valACYclovir (  VALTREX) 1000 MG tablet Take 2 tabs every 12 hours x 1 day as needed for cold sores 20 tablet 0  . VITAMIN D PO Take by mouth.    . ondansetron (ZOFRAN) 4 MG tablet Take 1 tablet (4 mg total) by mouth every 8 (eight) hours as needed. 20 tablet 0   No facility-administered medications prior to visit.    Allergies  Allergen Reactions  . Tylenol [Acetaminophen] Other (See Comments)    Increased liver enzymes  . Codeine Nausea And Vomiting    ROS Review of Systems    Objective:    Physical Exam  Constitutional: She is oriented to person, place, and time. She appears well-developed and well-nourished.  HENT:  Head: Normocephalic and atraumatic.  Both TMs are retracted.    Eyes: Conjunctivae are normal.  Cardiovascular: Normal rate, regular rhythm and normal heart sounds.  Pulmonary/Chest: Effort normal and breath sounds normal.  Abdominal: Soft. Bowel sounds are normal. She exhibits no distension and no mass. There is no abdominal tenderness. There is no rebound and no guarding.  Musculoskeletal:     Comments: Area of back pain over her spine is just below her bra strap. No CVA tenderness.   Neurological: She is alert and oriented to person, place, and time.  Skin: Skin is warm and dry.  Psychiatric: She has a normal mood and affect. Her behavior is normal.    BP (!) 122/58   Pulse 65   Ht 5\' 5"  (1.651 m)   Wt 153 lb (69.4 kg)   SpO2 97%   BMI 25.46 kg/m  Wt Readings from Last 3 Encounters:  10/01/19 153 lb (69.4 kg)  08/27/19 160 lb (72.6 kg)  08/22/19 160 lb (72.6 kg)     There are no preventive care reminders to display for this patient.  There are no preventive care reminders to display for this patient.  Lab Results  Component Value Date   TSH 0.95 12/25/2018   Lab Results  Component Value Date   WBC 6.6 12/25/2018   HGB 12.6 12/25/2018   HCT 39.0 12/25/2018   MCV 88.6 12/25/2018   PLT  195.0 12/25/2018   Lab Results  Component Value Date   NA 139 06/28/2019   K 4.7 06/28/2019   CO2 28 06/28/2019   GLUCOSE 110 (H) 06/28/2019   BUN 10 06/28/2019   CREATININE 0.78 06/28/2019   BILITOT 0.3 12/25/2018   ALKPHOS 109 12/25/2018   AST 32 12/25/2018   ALT 42 (H) 12/25/2018   PROT 6.5 12/25/2018   ALBUMIN 3.8 12/25/2018   CALCIUM 9.3 06/28/2019   ANIONGAP 9 05/13/2018   GFR 78.59 12/25/2018   Lab Results  Component Value Date   CHOL 258 (H) 06/28/2019   Lab Results  Component Value Date   HDL 37 (L) 06/28/2019   Lab Results  Component Value Date   LDLCALC 184 (H) 06/28/2019   Lab Results  Component Value Date   TRIG 190 (H) 06/28/2019   Lab Results  Component Value Date   CHOLHDL 7.0 (H) 06/28/2019   Lab Results  Component Value Date   HGBA1C 6.1 12/25/2018      Assessment & Plan:   Problem List Items Addressed This Visit      Cardiovascular and Mediastinum   Benign essential hypertension - Primary   Relevant Orders   COMPLETE METABOLIC PANEL WITH GFR   CBC with Differential/Platelet     Respiratory   Asthma, mild persistent    Other Visit  Diagnoses    RUQ pain       Relevant Orders   CT Abdomen Pelvis W Contrast   COMPLETE METABOLIC PANEL WITH GFR   CBC with Differential/Platelet   Screening for malignant neoplasm of colon       Relevant Orders   Cologuard   Right upper quadrant pain       Relevant Orders   CT Abdomen Pelvis W Contrast     Ear pressure/pain-both TMs were retracted.  Continue with oral antihistamine and nasal steroid spray.  Could consider a trial of prednisone.  He declined today but she is welcome to call back if she feels like its not improving with the nasal steroid spray.  Right upper quadrant pain-unclear etiology.  Recommend CT for further work-up and evaluation.  Want to check her liver out just to make sure there is no sign of mass etc.  Also evaluate her kidneys and she is also having pain over that flank  area.  Also consider could be coming from her spine she could be having radicular symptoms. Consider constipation.     Declined pneumonia vaccine today.  Due for repeat Cologuard we will order that today as well.  No orders of the defined types were placed in this encounter.   Follow-up: Return in about 6 months (around 04/02/2020) for Hypertension.    Beatrice Lecher, MD

## 2019-10-02 LAB — CBC WITH DIFFERENTIAL/PLATELET
Absolute Monocytes: 510 cells/uL (ref 200–950)
Basophils Absolute: 26 cells/uL (ref 0–200)
Basophils Relative: 0.3 %
Eosinophils Absolute: 167 cells/uL (ref 15–500)
Eosinophils Relative: 1.9 %
HCT: 39.9 % (ref 35.0–45.0)
Hemoglobin: 13.3 g/dL (ref 11.7–15.5)
Lymphs Abs: 2446 cells/uL (ref 850–3900)
MCH: 28.7 pg (ref 27.0–33.0)
MCHC: 33.3 g/dL (ref 32.0–36.0)
MCV: 86 fL (ref 80.0–100.0)
MPV: 10.1 fL (ref 7.5–12.5)
Monocytes Relative: 5.8 %
Neutro Abs: 5650 cells/uL (ref 1500–7800)
Neutrophils Relative %: 64.2 %
Platelets: 239 10*3/uL (ref 140–400)
RBC: 4.64 10*6/uL (ref 3.80–5.10)
RDW: 13 % (ref 11.0–15.0)
Total Lymphocyte: 27.8 %
WBC: 8.8 10*3/uL (ref 3.8–10.8)

## 2019-10-02 LAB — COMPLETE METABOLIC PANEL WITH GFR
AG Ratio: 1.4 (calc) (ref 1.0–2.5)
ALT: 27 U/L (ref 6–29)
AST: 23 U/L (ref 10–35)
Albumin: 3.9 g/dL (ref 3.6–5.1)
Alkaline phosphatase (APISO): 125 U/L (ref 37–153)
BUN: 15 mg/dL (ref 7–25)
CO2: 25 mmol/L (ref 20–32)
Calcium: 9.4 mg/dL (ref 8.6–10.4)
Chloride: 105 mmol/L (ref 98–110)
Creat: 0.91 mg/dL (ref 0.60–0.93)
GFR, Est African American: 74 mL/min/{1.73_m2} (ref >=60)
GFR, Est Non African American: 63 mL/min/{1.73_m2} (ref >=60)
Globulin: 2.7 g/dL (calc) (ref 1.9–3.7)
Glucose, Bld: 88 mg/dL (ref 65–99)
Potassium: 4.8 mmol/L (ref 3.5–5.3)
Sodium: 138 mmol/L (ref 135–146)
Total Bilirubin: 0.4 mg/dL (ref 0.2–1.2)
Total Protein: 6.6 g/dL (ref 6.1–8.1)

## 2019-10-03 ENCOUNTER — Telehealth: Payer: Self-pay

## 2019-10-03 DIAGNOSIS — R1011 Right upper quadrant pain: Secondary | ICD-10-CM

## 2019-10-03 NOTE — Progress Notes (Signed)
All labs are normal. 

## 2019-10-03 NOTE — Telephone Encounter (Signed)
Okay, please update patient on change.

## 2019-10-03 NOTE — Telephone Encounter (Signed)
Insurance requiring Ultrasound be done prior to CT of Abdomen Pelvis.   Can we order this STAT so we can get it faxed to insurance?

## 2019-10-04 ENCOUNTER — Ambulatory Visit (INDEPENDENT_AMBULATORY_CARE_PROVIDER_SITE_OTHER): Payer: Medicare HMO

## 2019-10-04 ENCOUNTER — Other Ambulatory Visit: Payer: Self-pay

## 2019-10-04 ENCOUNTER — Encounter: Payer: Medicare HMO | Admitting: Podiatry

## 2019-10-04 DIAGNOSIS — R1011 Right upper quadrant pain: Secondary | ICD-10-CM | POA: Diagnosis not present

## 2019-10-04 DIAGNOSIS — K760 Fatty (change of) liver, not elsewhere classified: Secondary | ICD-10-CM | POA: Diagnosis not present

## 2019-10-04 NOTE — Telephone Encounter (Signed)
Patient updated. Will get ultrasound today.

## 2019-10-22 ENCOUNTER — Other Ambulatory Visit: Payer: Self-pay

## 2019-10-22 ENCOUNTER — Other Ambulatory Visit: Payer: Medicare HMO

## 2019-10-22 ENCOUNTER — Ambulatory Visit (INDEPENDENT_AMBULATORY_CARE_PROVIDER_SITE_OTHER): Payer: Medicare HMO

## 2019-10-22 DIAGNOSIS — K5730 Diverticulosis of large intestine without perforation or abscess without bleeding: Secondary | ICD-10-CM | POA: Diagnosis not present

## 2019-10-22 DIAGNOSIS — R1011 Right upper quadrant pain: Secondary | ICD-10-CM

## 2019-10-22 DIAGNOSIS — K760 Fatty (change of) liver, not elsewhere classified: Secondary | ICD-10-CM | POA: Diagnosis not present

## 2019-10-22 MED ORDER — IOHEXOL 300 MG/ML  SOLN
100.0000 mL | Freq: Once | INTRAMUSCULAR | Status: AC | PRN
  Administered 2019-10-22: 100 mL via INTRAVENOUS

## 2019-10-23 ENCOUNTER — Encounter: Payer: Self-pay | Admitting: Family Medicine

## 2019-10-23 ENCOUNTER — Other Ambulatory Visit: Payer: Self-pay | Admitting: *Deleted

## 2019-10-23 MED ORDER — MONTELUKAST SODIUM 10 MG PO TABS: ORAL_TABLET | ORAL | 3 refills | Status: DC

## 2019-10-25 ENCOUNTER — Other Ambulatory Visit: Payer: Self-pay

## 2019-10-25 ENCOUNTER — Ambulatory Visit (INDEPENDENT_AMBULATORY_CARE_PROVIDER_SITE_OTHER): Payer: Medicare HMO | Admitting: Podiatry

## 2019-10-25 DIAGNOSIS — Z9889 Other specified postprocedural states: Secondary | ICD-10-CM

## 2019-10-25 DIAGNOSIS — M2011 Hallux valgus (acquired), right foot: Secondary | ICD-10-CM

## 2019-10-25 DIAGNOSIS — M2012 Hallux valgus (acquired), left foot: Secondary | ICD-10-CM

## 2019-10-25 NOTE — Progress Notes (Signed)
She presents today for follow-up of her bunionectomy first metatarsophalangeal joint bilaterally.  She states that really she is doing well she still little painful and tender on the right foot with some swelling but otherwise seems to be doing okay.  States that she is not able to get into her regular shoe yet.  Objective: Vital signs are stable she is alert and oriented x3.  There is no erythema edema cellulitis drainage or odor she has great range of motion first metatarsophalangeal joints mild tenderness on palpation medial aspect of the metatarsophalangeal joint.  Assessment: Well-healing surgical foot status post silver bunionectomy bilateral.  Plan: Encouraged range of motion activities gave her examples of physical therapy to do and I will follow-up with her and 4 to 6 weeks.

## 2019-11-09 ENCOUNTER — Encounter: Payer: Self-pay | Admitting: Family Medicine

## 2019-11-09 ENCOUNTER — Telehealth: Payer: Self-pay

## 2019-11-09 NOTE — Telephone Encounter (Signed)
Inst pt that she is taking Topamax correctly and to cont taking at HS. Inst her that I will update Dr Tat on clonazepam dose change. Encouraged her to contact PCP re hair growth medication questions. Pt verbalized understanding of all info.

## 2019-11-12 DIAGNOSIS — Z20822 Contact with and (suspected) exposure to covid-19: Secondary | ICD-10-CM | POA: Diagnosis not present

## 2019-11-12 DIAGNOSIS — Z03818 Encounter for observation for suspected exposure to other biological agents ruled out: Secondary | ICD-10-CM | POA: Diagnosis not present

## 2019-11-14 ENCOUNTER — Encounter: Payer: Self-pay | Admitting: Family Medicine

## 2019-11-22 ENCOUNTER — Other Ambulatory Visit: Payer: Self-pay

## 2019-11-22 ENCOUNTER — Encounter: Payer: Self-pay | Admitting: Nurse Practitioner

## 2019-11-22 ENCOUNTER — Ambulatory Visit (INDEPENDENT_AMBULATORY_CARE_PROVIDER_SITE_OTHER): Payer: Medicare HMO | Admitting: Nurse Practitioner

## 2019-11-22 VITALS — BP 135/74 | HR 58 | Temp 98.1°F | Ht 65.0 in | Wt 151.7 lb

## 2019-11-22 DIAGNOSIS — L74510 Primary focal hyperhidrosis, axilla: Secondary | ICD-10-CM | POA: Diagnosis not present

## 2019-11-22 DIAGNOSIS — L659 Nonscarring hair loss, unspecified: Secondary | ICD-10-CM

## 2019-11-22 DIAGNOSIS — F418 Other specified anxiety disorders: Secondary | ICD-10-CM

## 2019-11-22 DIAGNOSIS — R69 Illness, unspecified: Secondary | ICD-10-CM | POA: Diagnosis not present

## 2019-11-22 MED ORDER — ALUMINUM CHLORIDE 20 % EX SOLN: CUTANEOUS | 3 refills | Status: DC

## 2019-11-22 MED ORDER — TOPIRAMATE 100 MG PO TABS: 100.0000 mg | ORAL_TABLET | Freq: Two times a day (BID) | ORAL | 0 refills | Status: DC

## 2019-11-22 NOTE — Telephone Encounter (Signed)
Rx(s) sent to pharmacy electronically.  

## 2019-11-22 NOTE — Patient Instructions (Addendum)
You can try to take a supplement of Omega 3 and Omega 6 fatty acids, antioxidants, and pumpkin seed oil 400 mg per day to see if this helps with hair loss.   Hyperhidrosis Hyperhidrosis is a condition in which the body sweats a lot more than normal (excessively). Sweating is a necessary function for a human body. It is normal to sweat when you are hot, physically active, or anxious. However, hyperhidrosis is sweating to an excessive degree. Although the condition is not a serious one, it can make you feel embarrassed. There are two kinds of hyperhidrosis:  Primary hyperhidrosis. The sweating usually localizes in one part of your body, such as your underarms, or in a few areas, such as your feet, face, underarms, and hands. This is the more common kind of hyperhidrosis.  Secondary hyperhidrosis. This type usually affects your entire body. What are the causes? The cause of this condition depends on the kind of hyperhidrosis that you have.  Primary hyperhidrosis may be caused by sweat glands that are more active than normal. This type is the most common type and seen in people for most of their lives.   Secondary hyperhidrosis may be caused by an underlying condition or by taking certain medicines, such as antidepressants or diabetes medicines. Possible conditions that may cause secondary hyperhidrosis include: ? Diabetes. ? Gout. ? Anxiety. ? Obesity. ? Menopause. ? Overactive thyroid (hyperthyroidism). ? Tumors. ? Frostbite. ? Certain types of cancers. ? Alcoholism. ? Injury to your nervous system. ? Stroke. ? Parkinson's disease. What increases the risk? You are more likely to develop primary hyperhidrosis if you have a family history of the condition. What are the signs or symptoms? Symptoms of this condition include:  Feeling like you are sweating constantly, even while you are not being active.  Having skin that peels or gets paler or softer in the areas where you sweat the  most.  Being able to see sweat on your skin. Other symptoms depend on the kind of hyperhidrosis that you have.  Symptoms of primary hyperhidrosis may include: ? Sweating in the same location on both sides of your body. ? Sweating only during the day and not while you are sleeping. ? Sweating in specific areas, such as your underarms, palms, feet, and face.  Symptoms of secondary hyperhidrosis may include: ? Sweating all over your body. ? Sweating even while you sleep. How is this diagnosed? This condition may be diagnosed by:  Medical history.  Physical exam. You may also have other tests, including:  Tests to measure the amount of sweat you produce and to show the areas where you sweat the most. These tests may involve: ? Using color-changing chemicals to show patterns of sweating on the skin. ? Weighing paper that has been applied to the skin. This will show the amount of sweat that your body produces. ? Measuring the amount of water that evaporates from the skin. ? Using infrared technology to show patterns of sweating on the skin.  Tests to check for other conditions that may be causing excess sweating. This may include blood, urine, or imaging tests. How is this treated? Treatment for this condition depends on the kind of hyperhidrosis that you have and the areas of your body that are affected. Your health care provider will also treat any underlying conditions. Treatment may include:  Medicines, such as: ? Antiperspirants. These are medicines that stop sweat. ? Injectable medicines. These may include small injections of botulinum toxin. ? Oral medicines.  These are taken by mouth to treat underlying conditions and other symptoms.  A procedure to: ? Temporarily turn off the sweat glands in your hands and feet (iontophoresis). ? Remove your sweat glands. ? Cut or destroy the nerves so that they do not send a signal to the sweat glands (sympathectomy). Follow these  instructions at home: Lifestyle   Limit or avoid foods or beverages that may increase your risk of sweating, such as: ? Spicy food. ? Caffeine. ? Alcohol. ? Foods that contain monosodium glutamate (MSG).  If your feet sweat: ? Wear sandals when possible. ? Do not wear cotton socks. Wear socks that remove or wick moisture from your feet. ? Wear leather shoes. ? Avoid wearing the same pair of shoes for two days in a row.  Try placing sweat pads under your clothes to prevent underarm sweat from showing.  Keep a journal of your sweat symptoms and when they occur. This may help you identify things that trigger your sweating. General instructions  Take over-the-counter and prescription medicines only as told by your health care provider.  Use antiperspirants as told by your health care provider.  Consider joining a hyperhidrosis support group.  Keep all follow-up visits as told by your health care provider. This is important. Contact a health care provider if:  You have new symptoms.  Your symptoms get worse. Summary  Hyperhidrosis is a condition in which the body sweats a lot more than normal (excessively).  With primary hyperhidrosis, the sweating usually localizes in one part of your body, such as your underarms, or in a few areas, such as your feet, face, underarms, and hands. It is caused by overactive sweat glands in the affected area.  With secondary hyperhidrosis, the sweating affects your entire body. This is caused by an underlying condition.  Treatment for this condition depends on the kind of hyperhidrosis that you have and the parts of your body that are affected. This information is not intended to replace advice given to you by your health care provider. Make sure you discuss any questions you have with your health care provider. Document Revised: 03/06/2019 Document Reviewed: 05/06/2017 Elsevier Patient Education  [employer].   Review this information  about the Ezetimibe for cholesterol and if you think you would like to try it, let me know.   Ezetimibe Tablets What is this medicine? EZETIMIBE (ez ET i mibe) blocks the absorption of cholesterol from the stomach. It can help lower blood cholesterol for patients who are at risk of getting heart disease or a stroke. It is only for patients whose cholesterol level is not controlled by diet. This medicine may be used for other purposes; ask your health care provider or pharmacist if you have questions. COMMON BRAND NAME(S): Zetia What should I tell my health care provider before I take this medicine? They need to know if you have any of these conditions: liver disease an unusual or allergic reaction to ezetimibe, medicines, foods, dyes, or preservatives pregnant or trying to get pregnant breast-feeding How should I use this medicine? Take this medicine by mouth with a glass of water. Follow the directions on the prescription label. This medicine can be taken with or without food. Take your doses at regular intervals. Do not take your medicine more often than directed. Talk to your pediatrician regarding the use of this medicine in children. Special care may be needed. Overdosage: If you think you have taken too much of this medicine contact a poison  control center or emergency room at once. NOTE: This medicine is only for you. Do not share this medicine with others. What if I miss a dose? If you miss a dose, take it as soon as you can. If it is almost time for your next dose, take only that dose. Do not take double or extra doses. What may interact with this medicine? Do not take this medicine with any of the following medications: fenofibrate gemfibrozil This medicine may also interact with the following medications: antacids cyclosporine herbal medicines like red yeast rice other medicines to lower cholesterol or triglycerides This list may not describe all possible interactions. Give  your health care provider a list of all the medicines, herbs, non-prescription drugs, or dietary supplements you use. Also tell them if you smoke, drink alcohol, or use illegal drugs. Some items may interact with your medicine. What should I watch for while using this medicine? Visit your doctor or health care professional for regular checks on your progress. You will need to have your cholesterol levels checked. If you are also taking some other cholesterol medicines, you will also need to have tests to make sure your liver is working properly. Tell your doctor or health care professional if you get any unexplained muscle pain, tenderness, or weakness, especially if you also have a fever and tiredness. You need to follow a low-cholesterol, low-fat diet while you are taking this medicine. This will decrease your risk of getting heart and blood vessel disease. Exercising and avoiding alcohol and smoking can also help. Ask your doctor or dietician for advice. What side effects may I notice from receiving this medicine? Side effects that you should report to your doctor or health care professional as soon as possible: allergic reactions like skin rash, itching or hives, swelling of the face, lips, or tongue dark yellow or brown urine unusually weak or tired yellowing of the skin or eyes Side effects that usually do not require medical attention (report to your doctor or health care professional if they continue or are bothersome): diarrhea dizziness headache stomach upset or pain This list may not describe all possible side effects. Call your doctor for medical advice about side effects. You may report side effects to FDA at 1-800-FDA-1088. Where should I keep my medicine? Keep out of the reach of children. Store at room temperature between 15 and 30 degrees C (59 and 86 degrees F). Protect from moisture. Keep container tightly closed. Throw away any unused medicine after the expiration date. NOTE:  This sheet is a summary. It may not cover all possible information. If you have questions about this medicine, talk to your doctor, pharmacist, or health care provider.  2020 Elsevier/Gold Standard (2011-11-08 15:39:09)

## 2019-11-22 NOTE — Progress Notes (Signed)
Acute Office Visit  Subjective:    Patient ID: Michelle Donovan, female    DOB: December 18, 1945, 74 y.o.   MRN: [id number]  Chief Complaint  Patient presents with  . Alopecia    thinks it may be caused by the topirimate  . Excessive Sweating  . Thick Blood    states the last few times she has had her blood drawn that the blood looked really thick    HPI Patient is in today to discuss alopecia, excessive axillary perspiration, and concerns about thick blood.   ALOPECIA Michelle Donovan reports that she has recently restarted on topiramate for essential tremor. She states that she has noticed that she has started to loose hair since she has titrated up to the 100mg  per day dose. She reports that this happened once before when she was taking this medication and it caused her to stop taking it. She does not wish to stop the medication, as she feels it is very effective for her tremor, but would like to know if there can be anything done to lessen or stop the hair loss.   She denies weakness, fatigue, tachycardia, or feeling of fullness in her throat.   PERSPIRATION Michelle Donovan reports that she has always had excessive axillary perspiration to the point that she has stained clothing and regular deodorant is ineffective for any length of time. She reports that she is embarrassed by the odor and wetness and has not been able to find anything to stop this from happening. She did try an over the counter product to help reduce perspiration, but states it was harsh on her axilla and caused her lymph nodes to swell in this area, and therefore she stopped using it.   She denies a new onset of this problem or recent worsening of symptoms. She denies excessive perspiration in any other area of the body.   Michelle Donovan would like to discuss a concern with her blood. At her most recent lab draw she reports that her blood appeared to be "jelly like" in the vial when it was drawn and the lab technician had difficulty finding  a vein. She also reports that the lab technician mentioned that she needed to be better hydrated prior to lab draws. She endorses that she does not drink water, but does drink decaffeinated tea. She reports that she has been told she is not well hydrated at previous lab draws, but her blood has not ever looked so thick to her. She does endorse high triglycerides and high cholesterol, but she is concerned about her liver function (due to history of fatty liver) and does not wish to take a statin.  She denies shortness of breath, chest pain, edema in lower extremities, or pain in her calves.   Past Medical History:  Diagnosis Date  . Allergic rhinitis, cause unspecified   . Asthma    not bad per pt  . Dermatophytosis of scalp and beard   . Diverticulosis   . Essential and other specified forms of tremor   . Fatty liver   . Fatty liver disease, nonalcoholic 6468  . GERD (gastroesophageal reflux disease)   . HTN (hypertension)    history of  . Irritable bowel syndrome   . Lumbago    bulging disk per MRI   . Mixed hyperlipidemia   . Other acute reactions to stress   . Other diseases of lung, not elsewhere classified    solitary pulm. nodule(left)  . PONV (postoperative nausea and vomiting)   .  Thyroid nodule   . Unspecified asthma(493.90)   . Unspecified vitamin D deficiency     Past Surgical History:  Procedure Laterality Date  . ABDOMINAL HYSTERECTOMY    . CHOLECYSTECTOMY N/A 08/23/2016   Procedure: LAPAROSCOPIC CHOLECYSTECTOMY;  Surgeon: Clovis Riley, MD;  Location: [place];  Service: General;  Laterality: N/A;  . COLONOSCOPY    . COLONOSCOPY WITH PROPOFOL N/A 01/26/2019   Procedure: COLONOSCOPY WITH PROPOFOL;  Surgeon: Jonathon Bellows, MD;  Location[facility] ENDOSCOPY;  Service: Gastroenterology;  Laterality: N/A;  . ESOPHAGOGASTRODUODENOSCOPY (EGD) WITH PROPOFOL N/A 03/09/2017   Procedure: ESOPHAGOGASTRODUODENOSCOPY (EGD) WITH PROPOFOL;  Surgeon: Jonathon Bellows, MD;  Location: [facility]  ENDOSCOPY;  Service: Gastroenterology;  Laterality: N/A;  . LAPAROSCOPIC TOTAL HYSTERECTOMY  2002  . LAPAROSCOPIC UNILATERAL SALPINGO OOPHERECTOMY    . TONSILLECTOMY  1967/68  . TUBAL LIGATION  1986    Family History  Problem Relation Age of Onset  . Stroke Mother   . Tremor Mother   . Diabetes Mother   . Heart failure Mother   . Other Father        Trigeminal neuralgia  . Diabetes Sister   . Coronary artery disease Sister   . Irritable bowel syndrome Sister   . Tremor Sister   . Diabetes Brother        x 3  . Coronary artery disease Brother        x 3  . Irritable bowel syndrome Brother        x 3  . Tremor Brother        x 3  . Breast cancer Other        Aunts and cousin  . Colon cancer Maternal Grandfather     Social History   Socioeconomic History  . Marital status: Divorced    Spouse name: Not on file  . Number of children: 1  . Years of education: Not on file  . Highest education level: High school graduate  Occupational History  . Occupation: retired    Fish farm manager: OTHER    Comment: [employer]: [employer]  . Occupation: part time    Employer: [employer]  Tobacco Use  . Smoking status: Former Smoker    Types: Cigarettes    Quit date: 05/17/1980    Years since quitting: 39.5  . Smokeless tobacco: Never Used  Vaping Use  . Vaping Use: Never used  Substance and Sexual Activity  . Alcohol use: No  . Drug use: No  . Sexual activity: Never  Other Topics Concern  . 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 Difficulty of Paying Living Expenses:   Food Insecurity:   . Worried About Charity fundraiser in the Last Year:   . Arboriculturist in the Last Year:   Transportation Needs:   . Film/video editor (Medical):   Marland Kitchen Lack of  Transportation (Non-Medical):   Physical Activity: Inactive  . Days of Exercise per Week: 0 days  . Minutes of Exercise per Session: 0 min  Stress:   . Feeling of Stress :   Social Connections:   . Frequency of Communication with  Friends and Family:   . Frequency of Social Gatherings with Friends and Family:   . Attends Religious Services:   . Active Member of Clubs or Organizations:   . Attends Archivist Meetings:   Marland Kitchen Marital Status:   Intimate Partner Violence:   . Fear of Current or Ex-Partner:   . Emotionally Abused:   Marland Kitchen Physically Abused:   . Sexually Abused:     Outpatient Medications Prior to Visit  Medication Sig Dispense Refill  . Calcium Carb-Cholecalciferol (CALCIUM 1000 + D PO) Take 1 tablet by mouth daily.    . cetirizine (ZYRTEC) 10 MG tablet Take 10 mg by mouth daily.    . clonazePAM (KLONOPIN) 0.5 MG tablet TAKE 1 TAB BY MOUTH EVERY MORNING AND HALF A TAB EVERY NIGHT 135 tablet 1  . montelukast (SINGULAIR) 10 MG tablet TAKE 1 TABLET BY MOUTH EVERYDAY AT BEDTIME 90 tablet 3  . propranolol (INDERAL) 60 MG tablet TAKE 1 TABLET (60 MG TOTAL) BY MOUTH 2 (TWO) TIMES DAILY. 180 tablet 0  . topiramate (TOPAMAX) 100 MG tablet Take 1 tablet (100 mg total) by mouth 2 (two) times daily. 90 tablet 1  . valACYclovir (VALTREX) 1000 MG tablet Take 2 tabs every 12 hours x 1 day as needed for cold sores 20 tablet 0  . topiramate (TOPAMAX) 25 MG tablet 1 tablet qd x 1 week, then 2 tabs qd x 1 week, then 3 tabs daily 42 tablet 0  . VITAMIN D PO Take by mouth.     No facility-administered medications prior to visit.    Allergies  Allergen Reactions  . Tylenol [Acetaminophen] Other (See Comments)    Increased liver enzymes  . Codeine Nausea And Vomiting       Objective:    Physical Exam Vitals and nursing note reviewed.  Constitutional:      General: She is not in acute distress.    Appearance: Normal appearance. She is normal weight. She is not ill-appearing,  toxic-appearing or diaphoretic.  HENT:     Head: Normocephalic.  Eyes:     Extraocular Movements: Extraocular movements intact.     Conjunctiva/sclera: Conjunctivae normal.     Pupils: Pupils are equal, round, and reactive to light.  Cardiovascular:     Rate and Rhythm: Normal rate.     Pulses: Normal pulses.  Pulmonary:     Effort: Pulmonary effort is normal.  Abdominal:     General: Abdomen is flat.     Palpations: Abdomen is soft.  Musculoskeletal:        General: Normal range of motion.     Cervical back: Normal range of motion.  Skin:    General: Skin is warm and dry.     Capillary Refill: Capillary refill takes less than 2 seconds.  Neurological:     General: No focal deficit present.     Mental Status: She is alert and oriented to person, place, and time.  Psychiatric:        Mood and Affect: Mood normal.        Behavior: Behavior normal.        Thought Content: Thought content normal.        Judgment: Judgment normal.     BP 135/74   Pulse (!) 58   Temp 98.1 F (36.7 C) (Oral)   Ht 5\' 5"  (1.651 m)   Wt 151 lb 11.2 oz (68.8 kg)   SpO2 98%   BMI 25.24 kg/m  Wt Readings from  Last 3 Encounters:  11/22/19 151 lb 11.2 oz (68.8 kg)  10/01/19 153 lb (69.4 kg)  08/27/19 160 lb (72.6 kg)    There are no preventive care reminders to display for this patient.  There are no preventive care reminders to display for this patient.   Lab Results  Component Value Date   TSH 0.95 12/25/2018   Lab Results  Component Value Date   WBC 8.8 10/01/2019   HGB 13.3 10/01/2019   HCT 39.9 10/01/2019   MCV 86.0 10/01/2019   PLT 239 10/01/2019   Lab Results  Component Value Date   NA 138 10/01/2019   K 4.8 10/01/2019   CO2 25 10/01/2019   GLUCOSE 88 10/01/2019   BUN 15 10/01/2019   CREATININE 0.91 10/01/2019   BILITOT 0.4 10/01/2019   ALKPHOS 109 12/25/2018   AST 23 10/01/2019   ALT 27 10/01/2019   PROT 6.6 10/01/2019   ALBUMIN 3.8 12/25/2018   CALCIUM 9.4  10/01/2019   ANIONGAP 9 05/13/2018   GFR 78.59 12/25/2018   Lab Results  Component Value Date   CHOL 258 (H) 06/28/2019   Lab Results  Component Value Date   HDL 37 (L) 06/28/2019   Lab Results  Component Value Date   LDLCALC 184 (H) 06/28/2019   Lab Results  Component Value Date   TRIG 190 (H) 06/28/2019   Lab Results  Component Value Date   CHOLHDL 7.0 (H) 06/28/2019   Lab Results  Component Value Date   HGBA1C 6.1 12/25/2018       Assessment & Plan:  1. Primary focal hyperhidrosis, axilla Symptoms and presentation consistent with primary focal hyperhidrosis of the axilla bilaterally. This appears to be an ongoing problem throughout her lifespan. She has failed over the counter treatment options due to irritation of the axilla. We discussed that it it highly unlikely that this issue is of any health concern given the length of time she has had the condition. We also discussed the option of prescription management of excessive perspiration. She would like to try prescription treatment. She was provided with a Good Rx coupon code for the medication. We discussed if axillary irritation presents, she may try to utilize the product less frequently to see if this helps with the symptoms of hyperhidrosis while preventing localized irritation.   PLAN: - aluminum chloride (DRYSOL) 20 % external solution; Apply daily at bedtime for 14 days then 2-3 times a week to underarms. Wash off in the morning.  Dispense: 35 mL; Refill: 3 - If irritation develops, you may trial using the product every few days rather than daily to see if this helps reduce symptoms- please keep in mind this may make the medication less effective. - Follow-up if treatment is ineffective, cannot be tolerated, symptoms worsen, or symptoms fail to improve with treatment.   2. Anxiety about health Sadiyah is very concerned about the look of her blood with her most recent lab draw. An evaluation of the lab results do not  reveal any difficulties with the consistency of the blood, running the labs, or indication of increased platelet counts. Her labs did reveal significant elevation of triglycerides and LDL and a significant decline in HDL. Discussed the effect that improper hydration may have on the appearance and ease of blood draws. We also discussed that significantly elevated triglycerides can make the blood have an unusual appearance. I recommended a lipid lowering medication, however she has concerns due to history of fatty liver disease and is  not interested in trying a statin at this time. We discussed the option of ezetimibe as a lipid lowering agent and information was provided for her to review. Recommended that she review the information and seriously consider starting medication to help reduce her risk of cardiovascular events. Her most recent ASCVD score was 15.7%.   PLAN: -Review information about ezetimibe and let us know if you would like to start this medication for your cholesterol. - Stay well hydrated, water is best, but if you are unable to tolerate this you can drink other non-caffinated, unsweetened drinks to improve your hydration status.    3. Alopecia of scalp Thinning hair due to use of topiramate. Discussed that the only way to ensure no hair loss is to stop the medication, but there are over the counter options that may be helpful in preventing further hair loss while she is on the medicine. We also discussed use of Omega 3 and Omega 6 fatty acid supplement along with antioxidants, which have been shown to help reduce hair loss.   PLAN: - Consider supplementation of Omega 3 and Omega 6 fatty acids with antioxidants to see if this helps with hair loss. - You may trial over the counter products promoting reduction in hair loss.  - Unfortunately, there is no guarantee the hair loss will not continue with the use of the topiramate.    **Consider TSH with T3 and T4 testing if her symptoms  continue or worsen. Most recent TSH (11 months ago) was normal, however, this could explain some of her symptoms.   Follow-up as needed.   Orma Render, NP

## 2019-12-06 ENCOUNTER — Encounter: Payer: Medicare HMO | Admitting: Podiatry

## 2019-12-14 ENCOUNTER — Encounter: Payer: Self-pay | Admitting: Family Medicine

## 2019-12-14 MED ORDER — CLONAZEPAM 0.5 MG PO TABS: ORAL_TABLET | ORAL | 0 refills | Status: DC

## 2019-12-16 ENCOUNTER — Other Ambulatory Visit: Payer: Self-pay | Admitting: Family Medicine

## 2019-12-21 ENCOUNTER — Ambulatory Visit (INDEPENDENT_AMBULATORY_CARE_PROVIDER_SITE_OTHER): Payer: Medicare HMO

## 2019-12-21 ENCOUNTER — Encounter: Payer: Self-pay | Admitting: Family Medicine

## 2019-12-21 ENCOUNTER — Ambulatory Visit (INDEPENDENT_AMBULATORY_CARE_PROVIDER_SITE_OTHER): Payer: Medicare HMO | Admitting: Family Medicine

## 2019-12-21 VITALS — BP 110/41 | HR 59 | Ht 65.0 in | Wt 151.0 lb

## 2019-12-21 DIAGNOSIS — R05 Cough: Secondary | ICD-10-CM

## 2019-12-21 DIAGNOSIS — Z1231 Encounter for screening mammogram for malignant neoplasm of breast: Secondary | ICD-10-CM | POA: Diagnosis not present

## 2019-12-21 DIAGNOSIS — M542 Cervicalgia: Secondary | ICD-10-CM

## 2019-12-21 DIAGNOSIS — J329 Chronic sinusitis, unspecified: Secondary | ICD-10-CM

## 2019-12-21 DIAGNOSIS — R61 Generalized hyperhidrosis: Secondary | ICD-10-CM | POA: Insufficient documentation

## 2019-12-21 DIAGNOSIS — R053 Chronic cough: Secondary | ICD-10-CM

## 2019-12-21 DIAGNOSIS — R7301 Impaired fasting glucose: Secondary | ICD-10-CM

## 2019-12-21 DIAGNOSIS — E782 Mixed hyperlipidemia: Secondary | ICD-10-CM | POA: Diagnosis not present

## 2019-12-21 MED ORDER — AMOXICILLIN-POT CLAVULANATE 875-125 MG PO TABS: 1.0000 | ORAL_TABLET | Freq: Two times a day (BID) | ORAL | 0 refills | Status: DC

## 2019-12-21 MED ORDER — PREDNISONE 20 MG PO TABS: 40.0000 mg | ORAL_TABLET | Freq: Every day | ORAL | 0 refills | Status: DC

## 2019-12-21 NOTE — Progress Notes (Signed)
Acute Office Visit  Subjective:    Patient ID: Michelle Donovan, female    DOB: March 15, 1946, 74 y.o.   MRN: [id number]  Chief Complaint  Patient presents with   Night Sweats    HPI Patient is in today for night sweats.  She is now 74 but says it feels like she is going through menopause again.  She says the night sweats started maybe 3 months ago.  She does not remember any specific change or triggers.  She says that initially it was less than once a week but more recently it has been happening once or twice a week.  She says that she sweats so much in the middle the night that it literally wakes her up and she actually has to change her clothing.  She denies any changes in her skin or weight.  She said she recently did restart Topamax but had taken it before and did not have any side effects or problems with it.  She says the Topamax does cause a little bit of hair loss but it really helps with her tremor.  She denies any recent chest pain or palpitations.  She denies any lower extremity swelling or changes in blood pressure.  Normally her blood pressure looks great.  She says lately she has been having a little bit more upper neck pain she says sometimes will move it just a certain way and she will get a sharp discomfort.  She also notes that she has had a dry cough for right around the same amount of time.  She says its not very bothersome but she has noticed it.  No shortness of breath again.  No prior history of thyroid disorder.  She is not currently on an SSRI.  Says the only thing that helps that she is noted is that she is continue to have a lot of ear pressure and fullness and popping as well as a lot of sinus congestion and nasal drainage.  She had actually mentioned this at her visit back in July about 4 weeks ago but at the time she really did not want to do any active treatment she was hoping it would improve on its own.  She does take Singulair regularly.  She just as her symptoms  have not really resolved.  Past Medical History:  Diagnosis Date   Allergic rhinitis, cause unspecified    Asthma    not bad per pt   Dermatophytosis of scalp and beard    Diverticulosis    Essential and other specified forms of tremor    Fatty liver    Fatty liver disease, nonalcoholic 9211   GERD (gastroesophageal reflux disease)    HTN (hypertension)    history of   Irritable bowel syndrome    Lumbago    bulging disk per MRI    Mixed hyperlipidemia    Other acute reactions to stress    Other diseases of lung, not elsewhere classified    solitary pulm. nodule(left)   PONV (postoperative nausea and vomiting)    Thyroid nodule    Unspecified asthma(493.90)    Unspecified vitamin D deficiency     Past Surgical History:  Procedure Laterality Date   ABDOMINAL HYSTERECTOMY     CHOLECYSTECTOMY N/A 08/23/2016   Procedure: LAPAROSCOPIC CHOLECYSTECTOMY;  Surgeon: Clovis Riley, MD;  Location: [facility];  Service: General;  Laterality: N/A;   COLONOSCOPY     COLONOSCOPY WITH PROPOFOL N/A 01/26/2019   Procedure: COLONOSCOPY WITH PROPOFOL;  Surgeon: Jonathon Bellows, MD;  Location: [facility] ENDOSCOPY;  Service: Gastroenterology;  Laterality: N/A;   ESOPHAGOGASTRODUODENOSCOPY (EGD) WITH PROPOFOL N/A 03/09/2017   Procedure: ESOPHAGOGASTRODUODENOSCOPY (EGD) WITH PROPOFOL;  Surgeon: Jonathon Bellows, MD;  Location: [facility] ENDOSCOPY;  Service: Gastroenterology;  Laterality: N/A;   LAPAROSCOPIC TOTAL HYSTERECTOMY  2002   LAPAROSCOPIC UNILATERAL SALPINGO OOPHERECTOMY     TONSILLECTOMY  1967/68   TUBAL LIGATION  1986    Family History  Problem Relation Age of Onset   Stroke Mother    Tremor Mother    Diabetes Mother    Heart failure Mother    Other Father        Trigeminal neuralgia   Diabetes Sister    Coronary artery disease Sister    Irritable bowel syndrome Sister    Tremor Sister    Diabetes Brother        x 3   Coronary artery disease Brother        x 3    Irritable bowel syndrome Brother        x 3   Tremor Brother        x 3   Breast cancer Other        Aunts and cousin   Colon cancer Maternal Grandfather     Social History   Socioeconomic History   Marital status: Divorced    Spouse name: Not on file   Number of children: 1   Years of education: Not on file   Highest education level: High school graduate  Occupational History   Occupation: retired    Fish farm manager: OTHER    Comment: [place]: RETIRED   Occupation: part time    Employer: [employer]  Tobacco Use   Smoking status: Former Smoker    Types: Cigarettes    Quit date: 05/17/1980    Years since quitting: 39.6   Smokeless tobacco: Never Used  Vaping Use   Vaping Use: Never used  Substance and Sexual Activity   Alcohol use: No   Drug use: No   Sexual activity: Never  Other Topics Concern   Not on file  Social History Narrative   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[place]      Divorced      1 daughter-healthy      No regular exercise      Some veggies; rare fruit         Social Determinants of Radio broadcast assistant Strain:    Difficulty of Paying Living Expenses:   Food Insecurity:    Worried About Charity fundraiser in the Last Year:    Arboriculturist in the Last Year:   Transportation Needs:    Film/video editor (Medical):    Lack of Transportation (Non-Medical):   Physical Activity: Inactive   Days of Exercise per Week: 0 days   Minutes of Exercise per Session: 0 min  Stress:    Feeling of Stress :   Social Connections:    Frequency of Communication with Friends and Family:    Frequency of Social Gatherings with Friends and Family:    Attends Religious Services:    Active Member of Clubs or Organizations:    Attends Archivist Meetings:    Marital Status:   Intimate Partner Violence:    Fear of Current or Ex-Partner:    Emotionally Abused:    Physically Abused:     Sexually Abused:     Outpatient Medications  Prior to Visit  Medication Sig Dispense Refill   aluminum chloride (DRYSOL) 20 % external solution Apply daily at bedtime for 14 days then 2-3 times a week to underarms. Wash off in the morning. 35 mL 3   Calcium Carb-Cholecalciferol (CALCIUM 1000 + D PO) Take 1 tablet by mouth daily.     cetirizine (ZYRTEC) 10 MG tablet Take 10 mg by mouth daily.     clonazePAM (KLONOPIN) 0.5 MG tablet Take 1/2 tablet twice a day 90 tablet 0   montelukast (SINGULAIR) 10 MG tablet TAKE 1 TABLET BY MOUTH EVERYDAY AT BEDTIME 90 tablet 3   propranolol (INDERAL) 60 MG tablet TAKE 1 TABLET (60 MG TOTAL) BY MOUTH 2 (TWO) TIMES DAILY. 180 tablet 0   topiramate (TOPAMAX) 100 MG tablet Take 1 tablet (100 mg total) by mouth 2 (two) times daily. 90 tablet 0   valACYclovir (VALTREX) 1000 MG tablet Take 2 tabs every 12 hours x 1 day as needed for cold sores 20 tablet 0   No facility-administered medications prior to visit.    Allergies  Allergen Reactions   Tylenol [Acetaminophen] Other (See Comments)    Increased liver enzymes   Codeine Nausea And Vomiting    Review of Systems     Objective:    Physical Exam Constitutional:      Appearance: She is well-developed.  HENT:     Head: Normocephalic and atraumatic.     Right Ear: Tympanic membrane, ear canal and external ear normal.     Left Ear: Tympanic membrane, ear canal and external ear normal.     Ears:     Comments: Right TM is bulging. No fluid    Nose: Nose normal.     Mouth/Throat:     Mouth: Mucous membranes are moist.  Eyes:     Conjunctiva/sclera: Conjunctivae normal.     Pupils: Pupils are equal, round, and reactive to light.  Neck:     Thyroid: No thyromegaly.  Cardiovascular:     Rate and Rhythm: Normal rate and regular rhythm.     Heart sounds: Normal heart sounds.  Pulmonary:     Effort: Pulmonary effort is normal.     Breath sounds: Normal breath sounds. No wheezing.   Musculoskeletal:     Cervical back: Neck supple.  Lymphadenopathy:     Cervical: No cervical adenopathy.  Skin:    General: Skin is warm and dry.  Neurological:     Mental Status: She is alert and oriented to person, place, and time.     BP (!) 110/41    Pulse (!) 59    Ht 5\' 5"  (1.651 m)    Wt 151 lb (68.5 kg)    SpO2 99%    BMI 25.13 kg/m  Wt Readings from Last 3 Encounters:  12/21/19 151 lb (68.5 kg)  11/22/19 151 lb 11.2 oz (68.8 kg)  10/01/19 153 lb (69.4 kg)    Health Maintenance Due  Topic Date Due   INFLUENZA VACCINE  12/16/2019    There are no preventive care reminders to display for this patient.   Lab Results  Component Value Date   TSH 0.95 12/25/2018   Lab Results  Component Value Date   WBC 8.8 10/01/2019   HGB 13.3 10/01/2019   HCT 39.9 10/01/2019   MCV 86.0 10/01/2019   PLT 239 10/01/2019   Lab Results  Component Value Date   NA 138 10/01/2019   K 4.8 10/01/2019   CO2 25 10/01/2019  GLUCOSE 88 10/01/2019   BUN 15 10/01/2019   CREATININE 0.91 10/01/2019   BILITOT 0.4 10/01/2019   ALKPHOS 109 12/25/2018   AST 23 10/01/2019   ALT 27 10/01/2019   PROT 6.6 10/01/2019   ALBUMIN 3.8 12/25/2018   CALCIUM 9.4 10/01/2019   ANIONGAP 9 05/13/2018   GFR 78.59 12/25/2018   Lab Results  Component Value Date   CHOL 258 (H) 06/28/2019   Lab Results  Component Value Date   HDL 37 (L) 06/28/2019   Lab Results  Component Value Date   LDLCALC 184 (H) 06/28/2019   Lab Results  Component Value Date   TRIG 190 (H) 06/28/2019   Lab Results  Component Value Date   CHOLHDL 7.0 (H) 06/28/2019   Lab Results  Component Value Date   HGBA1C 6.1 12/25/2018       Assessment & Plan:   Problem List Items Addressed This Visit      Endocrine   IFG (impaired fasting glucose)    Her last x1 cm is a year ago was 6.1.  So we will plan to recheck that today to make sure that that is not contributing to any of her symptoms.  Lab Results  Component  Value Date   HGBA1C 6.1 12/25/2018         Relevant Orders   Hemoglobin A1c     Other   Night sweats - Primary    Unclear etiology at this point.  I think it is unlikely to be a medication side effect.  She is not on an SSRI etc.  Blood pressure actually looks really well controlled again the sweats are only occurring once or twice a week and only at night.  Unlikely to be hormonally triggered.  We did discuss making sure that she is up-to-date on her cancer screenings.  Her colonoscopy is up-to-date but she is due for mammograms reminded her to schedule.  We will do some blood work just to rule out inflammation as well as thyroid disorder anemia or infection.  She is also had what sounds most like a chronic sinusitis for well over a month at this point so we discussed at least treating that with prednisone and antibiotics and seeing if she feels better and if the night sweats resolved.      Relevant Orders   COMPLETE METABOLIC PANEL WITH GFR   Lipid panel   TSH   CBC with Differential/Platelet   Sedimentation rate   C-reactive protein    Other Visit Diagnoses    Neck pain       Chronic cough       Relevant Orders   DG Chest 2 View   Chronic sinusitis, unspecified location       Relevant Medications   predniSONE (DELTASONE) 20 MG tablet   amoxicillin-clavulanate (AUGMENTIN) 875-125 MG tablet   Screening mammogram, encounter for       Relevant Orders   MM 3D SCREEN BREAST BILATERAL      Chronic cough-she also mention she had a cough for several months as well mostly dry and intermittent.  Mild she feels like it is coming more from her chest versus her throat.  Consider causes including postnasal drip but since it has been greater than a month without a specific cause, we will go ahead and get chest x-ray today for further work-up.  Chronic sinusitis-treat with 14 days of Augmentin and 5 days prednisone burst.  Eustachian tube dysfunction-likely related to the  sinusitis.  Meds ordered this encounter  Medications   predniSONE (DELTASONE) 20 MG tablet    Sig: Take 2 tablets (40 mg total) by mouth daily with breakfast.    Dispense:  10 tablet    Refill:  0   amoxicillin-clavulanate (AUGMENTIN) 875-125 MG tablet    Sig: Take 1 tablet by mouth 2 (two) times daily.    Dispense:  28 tablet    Refill:  0     Beatrice Lecher, MD

## 2019-12-21 NOTE — Assessment & Plan Note (Signed)
Unclear etiology at this point.  I think it is unlikely to be a medication side effect.  She is not on an SSRI etc.  Blood pressure actually looks really well controlled again the sweats are only occurring once or twice a week and only at night.  Unlikely to be hormonally triggered.  We did discuss making sure that she is up-to-date on her cancer screenings.  Her colonoscopy is up-to-date but she is due for mammograms reminded her to schedule.  We will do some blood work just to rule out inflammation as well as thyroid disorder anemia or infection.  She is also had what sounds most like a chronic sinusitis for well over a month at this point so we discussed at least treating that with prednisone and antibiotics and seeing if she feels better and if the night sweats resolved.

## 2019-12-21 NOTE — Assessment & Plan Note (Signed)
Her last x1 cm is a year ago was 6.1.  So we will plan to recheck that today to make sure that that is not contributing to any of her symptoms.  Lab Results  Component Value Date   HGBA1C 6.1 12/25/2018

## 2019-12-22 LAB — CBC WITH DIFFERENTIAL/PLATELET
Absolute Monocytes: 382 cells/uL (ref 200–950)
Basophils Absolute: 39 cells/uL (ref 0–200)
Basophils Relative: 0.5 %
Eosinophils Absolute: 242 cells/uL (ref 15–500)
Eosinophils Relative: 3.1 %
HCT: 36.6 % (ref 35.0–45.0)
Hemoglobin: 11.9 g/dL (ref 11.7–15.5)
Lymphs Abs: 2137 cells/uL (ref 850–3900)
MCH: 28 pg (ref 27.0–33.0)
MCHC: 32.5 g/dL (ref 32.0–36.0)
MCV: 86.1 fL (ref 80.0–100.0)
MPV: 10.1 fL (ref 7.5–12.5)
Monocytes Relative: 4.9 %
Neutro Abs: 5000 cells/uL (ref 1500–7800)
Neutrophils Relative %: 64.1 %
Platelets: 217 10*3/uL (ref 140–400)
RBC: 4.25 10*6/uL (ref 3.80–5.10)
RDW: 13.1 % (ref 11.0–15.0)
Total Lymphocyte: 27.4 %
WBC: 7.8 10*3/uL (ref 3.8–10.8)

## 2019-12-22 LAB — COMPLETE METABOLIC PANEL WITH GFR
AG Ratio: 1.4 (calc) (ref 1.0–2.5)
ALT: 17 U/L (ref 6–29)
AST: 18 U/L (ref 10–35)
Albumin: 3.8 g/dL (ref 3.6–5.1)
Alkaline phosphatase (APISO): 113 U/L (ref 37–153)
BUN: 10 mg/dL (ref 7–25)
CO2: 23 mmol/L (ref 20–32)
Calcium: 9 mg/dL (ref 8.6–10.4)
Chloride: 108 mmol/L (ref 98–110)
Creat: 0.87 mg/dL (ref 0.60–0.93)
GFR, Est African American: 77 mL/min/{1.73_m2} (ref >=60)
GFR, Est Non African American: 67 mL/min/{1.73_m2} (ref >=60)
Globulin: 2.8 g/dL (calc) (ref 1.9–3.7)
Glucose, Bld: 98 mg/dL (ref 65–139)
Potassium: 4.3 mmol/L (ref 3.5–5.3)
Sodium: 139 mmol/L (ref 135–146)
Total Bilirubin: 0.3 mg/dL (ref 0.2–1.2)
Total Protein: 6.6 g/dL (ref 6.1–8.1)

## 2019-12-22 LAB — HEMOGLOBIN A1C
Hgb A1c MFr Bld: 5.8 % of total Hgb — ABNORMAL HIGH (ref <5.7)
Mean Plasma Glucose: 120 (calc)
eAG (mmol/L): 6.6 (calc)

## 2019-12-22 LAB — LIPID PANEL
Cholesterol: 223 mg/dL — ABNORMAL HIGH (ref <200)
HDL: 36 mg/dL — ABNORMAL LOW (ref >=50)
LDL Cholesterol (Calc): 158 mg/dL (calc) — ABNORMAL HIGH
Non-HDL Cholesterol (Calc): 187 mg/dL (calc) — ABNORMAL HIGH (ref <130)
Total CHOL/HDL Ratio: 6.2 (calc) — ABNORMAL HIGH (ref <5.0)
Triglycerides: 156 mg/dL — ABNORMAL HIGH (ref <150)

## 2019-12-22 LAB — SEDIMENTATION RATE: Sed Rate: 63 mm/h — ABNORMAL HIGH (ref 0–30)

## 2019-12-22 LAB — TSH: TSH: 0.59 mIU/L (ref 0.40–4.50)

## 2019-12-22 LAB — C-REACTIVE PROTEIN: CRP: 19.3 mg/L — ABNORMAL HIGH (ref <8.0)

## 2019-12-31 ENCOUNTER — Encounter: Payer: Self-pay | Admitting: Family Medicine

## 2019-12-31 DIAGNOSIS — J329 Chronic sinusitis, unspecified: Secondary | ICD-10-CM

## 2019-12-31 MED ORDER — DOXYCYCLINE HYCLATE 100 MG PO TABS: 100.0000 mg | ORAL_TABLET | Freq: Two times a day (BID) | ORAL | 0 refills | Status: DC

## 2019-12-31 NOTE — Telephone Encounter (Signed)
Patient given augmentin and prednisone on 12/21/19  Another visit needed?

## 2020-01-24 ENCOUNTER — Other Ambulatory Visit: Payer: Self-pay

## 2020-01-24 ENCOUNTER — Ambulatory Visit (INDEPENDENT_AMBULATORY_CARE_PROVIDER_SITE_OTHER): Payer: Medicare HMO

## 2020-01-24 DIAGNOSIS — Z1231 Encounter for screening mammogram for malignant neoplasm of breast: Secondary | ICD-10-CM

## 2020-01-25 ENCOUNTER — Ambulatory Visit: Payer: Medicare HMO

## 2020-02-18 NOTE — Progress Notes (Signed)
Assessment/Plan:    1.  Essential Tremor             -Continue propranolol, 60 mg twice per day.  Monitoring pulse/bradycardia.  She is asymptomatic.             -Patient has tried Topamax multiple times, resulting in some hair loss. This is a well-known side effects with this medicine.  However, she is not currently having side effects, but is not having as much effectiveness as she was when she first tried it.  We talked about the fact that the international movement Congress recommends high-dose Topamax (between 200 and 400 mg) for best effectiveness on tremor, but this can be limited by cognitive side effects.  She would like to go and try and push up the dosage.  She was given a titration schedule as follows to work her up to a total of Topamax, 100 mg in the morning and 150 mg at bedtime:   Week 1:  Take topamax 25 mg in the AM and 100 mg in the PM   Week 2:   Take topamax 25 mg, 2 in the AM and 100mg  in the PM   Week 3:   Take topamax 25 mg, 3 in the AM and 100 mg in the pM   Week 4:  Take topamax 100 mg twice per day   Week 5 and beyond  Take topamax 100 mg in the AM and 150 mg in the PM              -Continue clonazepam 0.5 mg, 1 tablet in the morning and half tablet at night.\  -She is clear today that she has decided that she does not want any surgical interventions for tremor.  2.  B12 deficiency             -was On supplementation but off of it now.  Will recheck in the future   3.  Hx NASH             -Was determined due to poor diet. Subjective:   Michelle Donovan was seen today in follow up for essential tremor.  My previous records were reviewed prior to todays visit. Pt denies falls.  Pt denies lightheadedness, near syncope. She has cut back on her clonazepam since last visit. She is back on Topamax, 100 mg nightly and thinks that it really helped initially - "like a miracle" but then her tremor came back.  She got worse after her 2nd covid shot, which  she had SE with.  She isn't losing hair right now but she is not having tremor.    Current prescribed movement disorder medications: propranolol, 60 mg twice per day.   Topamax, 100 mg daily clonazepam 0.5 mg, 1/2 bid (pt decreased since last visit).  Current/Previously tried tremor medications: topamax (tried in 2015/16 with success but had hair loss and stopped; retried again up to 200 mg and had hair loss again and she did not think it was effective and we stopped it; retried again in 2021 at patient request and felt that it was more effective although had hair loss); Metoprolol - felt drained ; primidone (50 mg - 1/2 at night but got first dose effect and stopped it); meclizine (? Why); on propranolol now - ? Helping now; gabapentin; artane (no help)    ALLERGIES:   Allergies  Allergen Reactions  . Tylenol [Acetaminophen] Other (See Comments)    Increased liver enzymes  .  Codeine Nausea And Vomiting    CURRENT MEDICATIONS:  Outpatient Encounter Medications as of 02/19/2020  Medication Sig  . cetirizine (ZYRTEC) 10 MG tablet Take 10 mg by mouth daily.  . clonazePAM (KLONOPIN) 0.5 MG tablet Take 1/2 tablet twice a day  . montelukast (SINGULAIR) 10 MG tablet TAKE 1 TABLET BY MOUTH EVERYDAY AT BEDTIME  . propranolol (INDERAL) 60 MG tablet TAKE 1 TABLET (60 MG TOTAL) BY MOUTH 2 (TWO) TIMES DAILY.  Marland Kitchen topiramate (TOPAMAX) 100 MG tablet Take 100 mg by mouth daily.  . valACYclovir (VALTREX) 1000 MG tablet Take 2 tabs every 12 hours x 1 day as needed for cold sores  . Calcium Carb-Cholecalciferol (CALCIUM 1000 + D PO) Take 1 tablet by mouth daily. (Patient not taking: Reported on 02/19/2020)  . [DISCONTINUED] aluminum chloride (DRYSOL) 20 % external solution Apply daily at bedtime for 14 days then 2-3 times a week to underarms. Wash off in the morning. (Patient not taking: Reported on 02/19/2020)  . [DISCONTINUED] doxycycline (VIBRA-TABS) 100 MG tablet Take 1 tablet (100 mg total) by mouth 2  (two) times daily.  . [DISCONTINUED] predniSONE (DELTASONE) 20 MG tablet Take 2 tablets (40 mg total) by mouth daily with breakfast.  . [DISCONTINUED] topiramate (TOPAMAX) 100 MG tablet Take 1 tablet (100 mg total) by mouth 2 (two) times daily. (Patient taking differently: Take 100 mg by mouth daily. )  . [DISCONTINUED] valACYclovir (VALTREX) 1000 MG tablet Take 2 tabs every 12 hours x 1 day as needed for cold sores   No facility-administered encounter medications on file as of 02/19/2020.     Objective:    PHYSICAL EXAMINATION:    VITALS:   Vitals:   02/19/20 1513  BP: (!) 138/97  Pulse: 66  SpO2: 99%  Weight: 150 lb (68 kg)  Height: 5\' 5"  (1.651 m)    GEN:  The patient appears stated age and is in NAD. HEENT:  Normocephalic, atraumatic.  The mucous membranes are moist. The superficial temporal arteries are without ropiness or tenderness. CV:  RRR Lungs:  CTAB Neck/HEME:  There are no carotid bruits bilaterally.  Neurological examination:  Orientation: The patient is alert and oriented x3. Cranial nerves: There is good facial symmetry. The speech is fluent and clear. Soft palate rises symmetrically and there is no tongue deviation. Hearing is intact to conversational tone. Sensation: Sensation is intact to light touch throughout Motor: Strength is at least antigravity x4.  Movement examination: Tone: There is normal tone in the UE/LE Abnormal movements: Mild tremor of the outstretched hands that becomes at least moderate with intention.  She does have trouble with Archimedes spirals. Coordination:  There is no decremation with RAM's, with any form of RAMS, including alternating supination and pronation of the forearm, hand opening and closing, finger taps, heel taps and toe taps. Gait and Station: The patient ambulates well. I have reviewed and interpreted the following labs independently   Chemistry      Component Value Date/Time   NA 139 12/21/2019 1549   K 4.3  12/21/2019 1549   CL 108 12/21/2019 1549   CO2 23 12/21/2019 1549   BUN 10 12/21/2019 1549   CREATININE 0.87 12/21/2019 1549      Component Value Date/Time   CALCIUM 9.0 12/21/2019 1549   ALKPHOS 109 12/25/2018 1002   AST 18 12/21/2019 1549   ALT 17 12/21/2019 1549   BILITOT 0.3 12/21/2019 1549      Lab Results  Component Value Date   WBC  7.8 12/21/2019   HGB 11.9 12/21/2019   HCT 36.6 12/21/2019   MCV 86.1 12/21/2019   PLT 217 12/21/2019   Lab Results  Component Value Date   TSH 0.59 12/21/2019     Chemistry      Component Value Date/Time   NA 139 12/21/2019 1549   K 4.3 12/21/2019 1549   CL 108 12/21/2019 1549   CO2 23 12/21/2019 1549   BUN 10 12/21/2019 1549   CREATININE 0.87 12/21/2019 1549      Component Value Date/Time   CALCIUM 9.0 12/21/2019 1549   ALKPHOS 109 12/25/2018 1002   AST 18 12/21/2019 1549   ALT 17 12/21/2019 1549   BILITOT 0.3 12/21/2019 1549         Total time spent on today's visit was 30 minutes, including both face-to-face time and nonface-to-face time.  Time included that spent on review of records (prior notes available to me/labs/imaging if pertinent), discussing treatment and goals, answering patient's questions and coordinating care.  Cc:  Hali Marry, MD

## 2020-02-19 ENCOUNTER — Encounter: Payer: Self-pay | Admitting: Neurology

## 2020-02-19 ENCOUNTER — Ambulatory Visit: Payer: Medicare HMO | Admitting: Neurology

## 2020-02-19 ENCOUNTER — Encounter: Payer: Self-pay | Admitting: Family Medicine

## 2020-02-19 ENCOUNTER — Other Ambulatory Visit: Payer: Self-pay

## 2020-02-19 VITALS — BP 138/97 | HR 66 | Ht 65.0 in | Wt 150.0 lb

## 2020-02-19 DIAGNOSIS — G250 Essential tremor: Secondary | ICD-10-CM | POA: Diagnosis not present

## 2020-02-19 DIAGNOSIS — J329 Chronic sinusitis, unspecified: Secondary | ICD-10-CM

## 2020-02-19 MED ORDER — VALACYCLOVIR HCL 1 G PO TABS: ORAL_TABLET | ORAL | 1 refills | Status: AC

## 2020-02-19 MED ORDER — TOPIRAMATE 100 MG PO TABS: 100.0000 mg | ORAL_TABLET | Freq: Two times a day (BID) | ORAL | 1 refills | Status: DC

## 2020-02-19 MED ORDER — TOPIRAMATE 50 MG PO TABS: 50.0000 mg | ORAL_TABLET | Freq: Every day | ORAL | 1 refills | Status: DC

## 2020-02-19 MED ORDER — TOPIRAMATE 25 MG PO TABS: ORAL_TABLET | ORAL | 0 refills | Status: DC

## 2020-02-19 NOTE — Patient Instructions (Signed)
Week 1: Take topamax 25 mg in the AM and 100 mg in the PM  Week 2:  Take topamax 25 mg, 2 in the AM and 100mg  in the PM  Week 3:  Take topamax 25 mg, 3 in the AM and 100 mg in the pM  Week 4: Take topamax 100 mg twice per day  Week 5 and beyond Take topamax 100 mg in the AM and 150 mg in the PM

## 2020-02-19 NOTE — Telephone Encounter (Signed)
Ref placed. Thank you

## 2020-03-05 DIAGNOSIS — H6980 Other specified disorders of Eustachian tube, unspecified ear: Secondary | ICD-10-CM | POA: Diagnosis not present

## 2020-03-05 DIAGNOSIS — J300 Vasomotor rhinitis: Secondary | ICD-10-CM | POA: Diagnosis not present

## 2020-03-06 ENCOUNTER — Other Ambulatory Visit: Payer: Self-pay | Admitting: Neurology

## 2020-03-07 MED ORDER — CLONAZEPAM 0.5 MG PO TABS
ORAL_TABLET | ORAL | 1 refills | Status: DC
Start: 2020-03-07 — End: 2020-08-21

## 2020-03-10 ENCOUNTER — Other Ambulatory Visit: Payer: Self-pay | Admitting: Family Medicine

## 2020-04-02 ENCOUNTER — Ambulatory Visit (INDEPENDENT_AMBULATORY_CARE_PROVIDER_SITE_OTHER): Payer: Medicare HMO | Admitting: Family Medicine

## 2020-04-02 ENCOUNTER — Encounter: Payer: Self-pay | Admitting: Family Medicine

## 2020-04-02 VITALS — BP 122/48 | HR 62 | Ht 65.0 in | Wt 150.0 lb

## 2020-04-02 DIAGNOSIS — R002 Palpitations: Secondary | ICD-10-CM

## 2020-04-02 DIAGNOSIS — I959 Hypotension, unspecified: Secondary | ICD-10-CM

## 2020-04-02 DIAGNOSIS — R102 Pelvic and perineal pain: Secondary | ICD-10-CM | POA: Diagnosis not present

## 2020-04-02 DIAGNOSIS — N8110 Cystocele, unspecified: Secondary | ICD-10-CM

## 2020-04-02 LAB — POCT URINALYSIS DIP (CLINITEK)
Bilirubin, UA: NEGATIVE
Blood, UA: NEGATIVE
Glucose, UA: NEGATIVE mg/dL
Ketones, POC UA: NEGATIVE mg/dL
Nitrite, UA: NEGATIVE
POC PROTEIN,UA: NEGATIVE
Spec Grav, UA: 1.025 (ref 1.010–1.025)
Urobilinogen, UA: 0.2 E.U./dL
pH, UA: 6 (ref 5.0–8.0)

## 2020-04-02 NOTE — Progress Notes (Signed)
Established Patient Office Visit  Subjective:  Patient ID: Michelle Donovan, female    DOB: 08-08-1945  Age: 74 y.o. MRN: [id number]  CC:  Chief Complaint  Patient presents with  . Hypertension    HPI Michelle Donovan presents for   She has flt more dizzy and off balance since increased her topamax was increased so she just went back down to 100mg  BID since 11/12.  She has felt a little better since coming down on her dose but still feels a little off balance and dizzy.  She is also had a significantly decreased appetite since being on the Topamax.  She does not skip meals but does tend to snack instead of eating a meal.  She has lost about 10 pounds over the last 6 months.  She does feel an occasional catching sensation in her chest so is frequently 12 times a week and just last a few seconds.  L time.  She also reports that she occasionally feels a fluttering in her chest that happens maybe once a month around the last usually can last seconds to maybe a minute at the most.  Joint drinks 1 cup of coffee in the morning and does not have any other caffeine she has not noticed any pattern to when it happens.  She does not get chest pain with that.  Seem to happen more at night.  She has a prolapsed bladder and feels like it is going to fall out.  He is having a lot of pelvic and vaginal pain with she says its not to the point where she is having to push tissue back in to the vaginal area.  But is just uncomfortable especially with coughing sneezing.  She is not having any leakage which is reassuring.  She notices that her urinary stream is no longer a normal stream which is seems to pour out.  She is notices been worse for about the last year.  No fevers chills or sweats or burning with urination.  She had a complete hysterectomy 2002.       Past Medical History:  Diagnosis Date  . Allergic rhinitis, cause unspecified   . Asthma    not bad per pt  . Dermatophytosis of scalp and  beard   . Diverticulosis   . Essential and other specified forms of tremor   . Fatty liver   . Fatty liver disease, nonalcoholic 0932  . GERD (gastroesophageal reflux disease)   . HTN (hypertension)    history of  . Irritable bowel syndrome   . Lumbago    bulging disk per MRI   . Mixed hyperlipidemia   . Other acute reactions to stress   . Other diseases of lung, not elsewhere classified    solitary pulm. nodule(left)  . PONV (postoperative nausea and vomiting)   . Thyroid nodule   . Unspecified asthma(493.90)   . Unspecified vitamin D deficiency     Past Surgical History:  Procedure Laterality Date  . ABDOMINAL HYSTERECTOMY    . CHOLECYSTECTOMY N/A 08/23/2016   Procedure: LAPAROSCOPIC CHOLECYSTECTOMY;  Surgeon: Clovis Riley, MD;  Location: [facility];  Service: General;  Laterality: N/A;  . COLONOSCOPY    . COLONOSCOPY WITH PROPOFOL N/A 01/26/2019   Procedure: COLONOSCOPY WITH PROPOFOL;  Surgeon: Jonathon Bellows, MD;  Location[facility] END[facility]COPY;  Service: Gastroenterology;  Laterality: N/A;  . ESOPHAGOGASTRODUODENOSCOPY (EGD) WITH PROPOFOL N/A 03/09/2017   Procedure: ESOPHAGOGASTRODUODENOSCOPY (EGD) WITH PROPOFOL;  Surgeon: Jonathon Bellows, MD;  Location:  [facility] ENDOSCOPY;  Service: Gastroenterology;  Laterality: N/A;  . LAPAROSCOPIC TOTAL HYSTERECTOMY  2002  . LAPAROSCOPIC UNILATERAL SALPINGO OOPHERECTOMY    . TONSILLECTOMY  1967/68  . TUBAL LIGATION  1986    Family History  Problem Relation Age of Onset  . Stroke Mother   . Tremor Mother   . Diabetes Mother   . Heart failure Mother   . Other Father        Trigeminal neuralgia  . Diabetes Sister   . Coronary artery disease Sister   . Irritable bowel syndrome Sister   . Tremor Sister   . Diabetes Brother        x 3  . Coronary artery disease Brother        x 3  . Irritable bowel syndrome Brother        x 3  . Tremor Brother        x 3  . Breast cancer Other        Aunts and cousin  . Colon cancer Maternal Grandfather      Social History   Socioeconomic History  . Marital status: Divorced    Spouse name: Not on file  . Number of children: 1  . Years of education: Not on file  . Highest education level: High school graduate  Occupational History  . Occupation: retired    Fish farm manager: OTHER    Comment: [place]: [employer]  . Occupation: part time    Employer: [employer]  Tobacco Use  . Smoking status: Former Smoker    Types: Cigarettes    Quit date: 05/17/1980    Years since quitting: 39.9  . Smokeless tobacco: Never Used  Vaping Use  . Vaping Use: Never used  Substance and Sexual Activity  . Alcohol use: No  . Drug use: No  . Sexual activity: Never  Other Topics Concern  . 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 Difficulty of Paying Living Expenses: Not on file  Food Insecurity:   . Worried About Charity fundraiser in the Last Year: Not on file  . Ran Out of Food in the Last Year: Not on file  Transportation Needs:   . Lack of Transportation (Medical): Not on file  . Lack of Transportation (Non-Medical): Not on file  Physical Activity: Inactive  . Days of Exercise per Week: 0 days  . Minutes of Exercise per Session: 0 min  Stress:   . Feeling of Stress : Not on file  Social Connections:   . Frequency of Communication with Friends and Family: Not on file  . Frequency of Social Gatherings with Friends and Family: Not on file  . Attends Religious Services: Not on file  . Active Member of Clubs or Organizations: Not on file  . Attends Archivist Meetings: Not on file  . Marital Status: Not on file  Intimate Partner Violence:   . Fear of Current or Ex-Partner: Not on file  . Emotionally Abused: Not on file  . Physically Abused: Not on file  .  Sexually Abused: Not on file    Outpatient Medications Prior to Visit  Medication Sig Dispense Refill  . cetirizine (ZYRTEC) 10 MG tablet Take 10 mg by mouth daily.    . clonazePAM (KLONOPIN) 0.5 MG tablet Take 1/2 tablet twice a day 90 tablet 1  . ipratropium (ATROVENT) 0.06 % nasal spray Place 2 sprays into both nostrils 2 (two) times daily.    . montelukast (SINGULAIR) 10 MG tablet TAKE 1 TABLET BY MOUTH EVERYDAY AT BEDTIME 90 tablet 3  . propranolol (INDERAL) 60 MG tablet TAKE 1 TABLET (60 MG TOTAL) BY MOUTH 2 (TWO) TIMES DAILY. 180 tablet 0  . topiramate (TOPAMAX) 100 MG tablet Take 1 tablet (100 mg total) by mouth 2 (two) times daily. 180 tablet 1  . valACYclovir (VALTREX) 1000 MG tablet Take 2 tabs every 12 hours x 1 day as needed for cold sores 20 tablet 1  . Calcium Carb-Cholecalciferol (CALCIUM 1000 + D PO) Take 1 tablet by mouth daily. (Patient not taking: Reported on 02/19/2020)    . topiramate (TOPAMAX) 25 MG tablet 1 TAB DAILY X 1 WEEK, 2 TABS DAILY X 1 WEEK, 3 TABS DAILY X 1 WEEK 42 tablet 0  . topiramate (TOPAMAX) 50 MG tablet Take 1 tablet (50 mg total) by mouth daily. 90 tablet 1   No facility-administered medications prior to visit.    Allergies  Allergen Reactions  . Tylenol [Acetaminophen] Other (See Comments)    Increased liver enzymes  . Codeine Nausea And Vomiting    ROS Review of Systems    Objective:    Physical Exam Vitals reviewed.  Constitutional:      Appearance: She is well-developed.  HENT:     Head: Normocephalic and atraumatic.  Eyes:     Conjunctiva/sclera: Conjunctivae normal.  Cardiovascular:     Rate and Rhythm: Normal rate.  Pulmonary:     Effort: Pulmonary effort is normal.  Skin:    General: Skin is dry.     Coloration: Skin is not pale.  Neurological:     Mental Status: She is alert and oriented to person, place, and time.  Psychiatric:        Behavior: Behavior normal.     BP (!) 122/48   Pulse 62   Ht 5\' 5"  (1.651 m)    Wt 150 lb (68 kg)   SpO2 100%   BMI 24.96 kg/m  Wt Readings from Last 3 Encounters:  04/02/20 150 lb (68 kg)  02/19/20 150 lb (68 kg)  12/21/19 151 lb (68.5 kg)     There are no preventive care reminders to display for this patient.  There are no preventive care reminders to display for this patient.  Lab Results  Component Value Date   TSH 0.59 12/21/2019   Lab Results  Component Value Date   WBC 7.8 12/21/2019   HGB 11.9 12/21/2019   HCT 36.6 12/21/2019   MCV 86.1 12/21/2019   PLT 217 12/21/2019   Lab Results  Component Value Date   NA 139 12/21/2019   K 4.3 12/21/2019   CO2 23 12/21/2019   GLUCOSE 98 12/21/2019   BUN 10 12/21/2019   CREATININE 0.87 12/21/2019   BILITOT 0.3 12/21/2019   ALKPHOS 109 12/25/2018   AST 18 12/21/2019   ALT 17 12/21/2019   PROT 6.6 12/21/2019   ALBUMIN 3.8 12/25/2018   CALCIUM 9.0 12/21/2019   ANIONGAP 9 05/13/2018   GFR 78.59 12/25/2018   Lab Results  Component Value Date   CHOL 223 (H) 12/21/2019   Lab Results  Component Value Date  HDL 36 (L) 12/21/2019   Lab Results  Component Value Date   LDLCALC 158 (H) 12/21/2019   Lab Results  Component Value Date   TRIG 156 (H) 12/21/2019   Lab Results  Component Value Date   CHOLHDL 6.2 (H) 12/21/2019   Lab Results  Component Value Date   HGBA1C 5.8 (H) 12/21/2019      Assessment & Plan:   Problem List Items Addressed This Visit    None    Visit Diagnoses    Female bladder prolapse    -  Primary   Relevant Orders   Ambulatory referral to Urology   POCT URINALYSIS DIP (CLINITEK) (Completed)   Pelvic pain       Relevant Orders   POCT URINALYSIS DIP (CLINITEK) (Completed)   Urine Culture (Completed)   Palpitations       Hypotension, unspecified hypotension type         Bladder prolapse-recommend referral to urology for more definitive treatment and care did go ahead and do a urinalysis today since she has had some pelvic pain just to rule out concomitant  infection.  Palpitations-we discussed that typically we do further work-up with a cardiac monitor and/or echocardiogram.  Because it happens very infrequently and is very brief it is likely related to PVCs or PACs but will monitor.  Discussed that if she notices it is happening more often let us know and we will do additional work-up.  Low diastolic blood pressure -  today normally her diastolics in the 22Q and it is definitely a little bit low for her she admits she has not been eating or eating or drinking as much as she used to do so really encouraged her to just push her fluids.  I suspect that the Topamax is probably causing some appetite suppression.  Meds ordered this encounter  Medications  . nitrofurantoin, macrocrystal-monohydrate, (MACROBID) 100 MG capsule    Sig: Take 1 capsule (100 mg total) by mouth 2 (two) times daily.    Dispense:  10 capsule    Refill:  0    Follow-up: No follow-ups on file.    Beatrice Lecher, MD

## 2020-04-04 LAB — URINE CULTURE
MICRO NUMBER:: 11218994
SPECIMEN QUALITY:: ADEQUATE

## 2020-04-04 MED ORDER — NITROFURANTOIN MONOHYD MACRO 100 MG PO CAPS: 100.0000 mg | ORAL_CAPSULE | Freq: Two times a day (BID) | ORAL | 0 refills | Status: DC

## 2020-04-06 ENCOUNTER — Encounter: Payer: Self-pay | Admitting: Family Medicine

## 2020-04-06 DIAGNOSIS — N8110 Cystocele, unspecified: Secondary | ICD-10-CM

## 2020-04-08 NOTE — Telephone Encounter (Signed)
Referral was already placed when she was here earlier.  Please see if we scheduled it in [place] or not.

## 2020-04-09 NOTE — Telephone Encounter (Signed)
Dr Madilyn Fireman   I sent her referral to [facility] they received the referral just haven't scheduled her yet as of yesterday. The referral was also just sent to them on Monday - CF

## 2020-04-17 NOTE — Progress Notes (Signed)
Virtual Visit Via Video   The purpose of this virtual visit is to provide medical care while limiting exposure to the novel coronavirus.    Consent was obtained for video visit:  Yes.   Answered questions that patient had about telehealth interaction:  Yes.   I discussed the limitations, risks, security and privacy concerns of performing an evaluation and management service by telemedicine. I also discussed with the patient that there may be a patient responsible charge related to this service. The patient expressed understanding and agreed to proceed.  Pt location: Home Physician Location: office Name of referring provider:  Hali Marry, * I connected with Michelle Donovan at patients initiation/request on 04/21/2020 at 10:45 AM EST by video enabled telemedicine application and verified that I am speaking with the correct person using two identifiers. Pt MRN:  [id number] Pt DOB:  1946/02/17 Video Participants:  Michelle Donovan;    Assessment/Plan:   1.  Essential tremor  -Continue propranolol, 60 mg twice per day.  Unable to go up higher because of bradycardia.  This is asymptomatic fortunately.  -For now, we will continue with clonazepam 0.5 mg, 1/2 po bid  -decrease topamax from 100 mg bid to 100 mg q day.    -We will slowly start primidone, 50 mg, 1/4 tablet at bed for a week, then half tablet at bedtime for 1 week and then go to 1 tablet nightly.  This is not going to be enough, but at least it is a place to start.  We discussed first dose effect in detail, which she has had in the past.  -not interested in sx  -Email sent to the patient with the above instructions.  2.  B12 deficiency  -We will need to recheck this in the future as she is now off supplementation.  This was a video visit.  3.  History NASH  -Etiology was poor diet.  Subjective   Patient seen today in follow-up for essential tremor.  She wanted to go up on the topiramate after our last visit,  but as with the past, found out that she once again experienced hair loss.  She emailed me just a few days ago and thought perhaps she wanted to consider primidone.  She tried it years ago, had first dose effect and did not go back to it again.  Tremor has gotten worse and more disabling.  She is not interested in surgical interventions.  Current movement d/o meds: propranolol, 60 mg twice per day.  Topamax, 100 mg bid clonazepam 0.5 mg, 1/2 bid (pt decreased since last visit).  Current/Previously tried tremor medications: topamax (tried in 2015/16 with success but had hair loss and stopped; retried again up to 200 mg and had hair loss again and she did not think it was effective and we stopped it; retried again in 2021 at patient request and felt that it was more effective although had hair loss); Metoprolol - felt drained ; primidone (50 mg - 1/2 at night but got first dose effect and stopped it); meclizine (? Why); on propranolol now - ? Helping now; gabapentin; artane (no help)   Current Outpatient Medications on File Prior to Visit  Medication Sig Dispense Refill  . Calcium Carb-Cholecalciferol (CALCIUM 1000 + D PO) Take 1 tablet by mouth daily.     . cetirizine (ZYRTEC) 10 MG tablet Take 10 mg by mouth daily.    . clonazePAM (KLONOPIN) 0.5 MG tablet Take 1/2 tablet twice a day  90 tablet 1  . ipratropium (ATROVENT) 0.06 % nasal spray Place 2 sprays into both nostrils 2 (two) times daily.    . montelukast (SINGULAIR) 10 MG tablet TAKE 1 TABLET BY MOUTH EVERYDAY AT BEDTIME 90 tablet 3  . propranolol (INDERAL) 60 MG tablet TAKE 1 TABLET (60 MG TOTAL) BY MOUTH 2 (TWO) TIMES DAILY. 180 tablet 0  . valACYclovir (VALTREX) 1000 MG tablet Take 2 tabs every 12 hours x 1 day as needed for cold sores 20 tablet 1   No current facility-administered medications on file prior to visit.     Objective   Vitals:   04/21/20 1026  Weight: 150 lb (68 kg)  Height: 5\' 5"  (1.651 m)   GEN:  The patient  appears stated age and is in NAD.     Follow up Instructions      -I discussed the assessment and treatment plan with the patient. The patient was provided an opportunity to ask questions and all were answered. The patient agreed with the plan and demonstrated an understanding of the instructions.   The patient was advised to call back or seek an in-person evaluation if the symptoms worsen or if the condition fails to improve as anticipated.    Total time spent on today's visit was 30 minutes, including both face-to-face time and nonface-to-face time.  Time included that spent on review of records (prior notes available to me/labs/imaging if pertinent), discussing treatment and goals, answering patient's questions and coordinating care.   Alonza Bogus, DO

## 2020-04-21 ENCOUNTER — Telehealth (INDEPENDENT_AMBULATORY_CARE_PROVIDER_SITE_OTHER): Payer: Medicare HMO | Admitting: Neurology

## 2020-04-21 ENCOUNTER — Encounter: Payer: Self-pay | Admitting: Neurology

## 2020-04-21 ENCOUNTER — Other Ambulatory Visit: Payer: Self-pay

## 2020-04-21 VITALS — Ht 65.0 in | Wt 150.0 lb

## 2020-04-21 DIAGNOSIS — G250 Essential tremor: Secondary | ICD-10-CM

## 2020-04-21 MED ORDER — PRIMIDONE 50 MG PO TABS: 50.0000 mg | ORAL_TABLET | Freq: Every day | ORAL | 1 refills | Status: DC

## 2020-04-21 MED ORDER — TOPIRAMATE 100 MG PO TABS: 100.0000 mg | ORAL_TABLET | Freq: Every day | ORAL | 1 refills | Status: DC

## 2020-04-22 DIAGNOSIS — H52209 Unspecified astigmatism, unspecified eye: Secondary | ICD-10-CM | POA: Diagnosis not present

## 2020-04-22 DIAGNOSIS — H5203 Hypermetropia, bilateral: Secondary | ICD-10-CM | POA: Diagnosis not present

## 2020-04-22 DIAGNOSIS — H269 Unspecified cataract: Secondary | ICD-10-CM | POA: Diagnosis not present

## 2020-04-22 DIAGNOSIS — H524 Presbyopia: Secondary | ICD-10-CM | POA: Diagnosis not present

## 2020-06-04 ENCOUNTER — Other Ambulatory Visit: Payer: Self-pay | Admitting: Family Medicine

## 2020-06-17 ENCOUNTER — Encounter: Payer: Self-pay | Admitting: Family Medicine

## 2020-06-18 NOTE — Telephone Encounter (Signed)
Lets schedule appt. [place] for vitual. There are some alternative treatment

## 2020-06-20 ENCOUNTER — Other Ambulatory Visit: Payer: Self-pay | Admitting: Family Medicine

## 2020-06-20 ENCOUNTER — Telehealth: Payer: Self-pay | Admitting: Family Medicine

## 2020-06-20 ENCOUNTER — Encounter: Payer: Self-pay | Admitting: Family Medicine

## 2020-06-20 ENCOUNTER — Telehealth (INDEPENDENT_AMBULATORY_CARE_PROVIDER_SITE_OTHER): Payer: Medicare HMO | Admitting: Family Medicine

## 2020-06-20 VITALS — Wt 150.0 lb

## 2020-06-20 DIAGNOSIS — L74510 Primary focal hyperhidrosis, axilla: Secondary | ICD-10-CM | POA: Diagnosis not present

## 2020-06-20 DIAGNOSIS — F513 Sleepwalking [somnambulism]: Secondary | ICD-10-CM

## 2020-06-20 DIAGNOSIS — R7301 Impaired fasting glucose: Secondary | ICD-10-CM | POA: Diagnosis not present

## 2020-06-20 DIAGNOSIS — R69 Illness, unspecified: Secondary | ICD-10-CM | POA: Diagnosis not present

## 2020-06-20 MED ORDER — GLYCOPYRRONIUM TOSYLATE 2.4 % EX PADS: 1.0000 "application " | MEDICATED_PAD | Freq: Every day | CUTANEOUS | 99 refills | Status: DC

## 2020-06-20 NOTE — Assessment & Plan Note (Addendum)
Walking has also started after having her Covid vaccine this is really unusual she is never really had this before over her lifetime even in childhood.  This is highly unusual.  She did have a recent medication change to primidone but she says it was after she was experiencing symptoms so I do not think this is the cause otherwise there is nothing else new.  She feels like she sleeps okay overall.  We could evaluate for underlying sleep apnea and possible Brain lesion

## 2020-06-20 NOTE — Telephone Encounter (Signed)
Michelle Donovan, can you do me a favor and call may be a couple the dermatology offices like [facility] and see if they offer any of the microwave radiation therapy for hyperhidrosis.  In particular there is a treatment device called Michelle Donovan.  Have a patient who is interested in treatment besides Botox and has not responded well to topicals and I wanted to see if this is something they offered through their clinic or not before I actually placed the referral.

## 2020-06-20 NOTE — Progress Notes (Signed)
Virtual Visit via Video Note  I connected with Michelle Donovan on 06/20/20 at  1:20 PM EST by a video enabled telemedicine application and verified that I am speaking with the correct person using two identifiers.   I discussed the limitations of evaluation and management by telemedicine and the availability of in person appointments. The patient expressed understanding and agreed to proceed.  Patient location: at home  Provider location: in office  Subjective:    CC: sweating excessively   HPI:  She was seen by one of my partners in July for hyperhidrosis.  Tried the Drysol but made her "glands" very sore.  Has also notices a sweet sickly odor.    She says this all started after she had her Covid vaccine in July she never really had this problem before.  She is tried multiple deodorant she even tried the aluminum chloride that my partner have prescribed but says it was actually causing some inflammation in the glands and so she had to stop it.  It did not cause any contact dermatitis.  She has been looking at something called Mira dry as an option for treatment and wanted to know if that would be a possibility.  She feels like Botox would be too costly as it would have to be repeated.  She is not sure the insurance would cover it.  Note form Mychart:  In July 2021 I discussed this problem of over active sweat glands in my underarm area.  She prescribed Drysol for me to try.  It did not work and it made my glands very sore.  I have used [employer] On which has worked well at times but that too makes my underarm glands hurt.  I've tried all different kinds of spray antiperspirants/deodorants.  I even ordered the Lume deodorant that was developed by a gynecologist for all body parts.  Nothing seems to work and my clothes are all being ruined by the smell.  I don't have sweat running down my body, but as soon as I shower and get dressed it almost feels like an area of mold growing under  my arms that doesn't have that horrible BO smell but a sickening sweet unpleasant smell that I really can't describe.  That is the smell that is locked in my clothes.  She has been sleep walking more recently as well. Started in July.    Past medical history, Surgical history, Family history not pertinant except as noted below, Social history, Allergies, and medications have been entered into the medical record, reviewed, and corrections made.   Review of Systems: No fevers, chills, night sweats, weight loss, chest pain, or shortness of breath.   Objective:    General: Speaking clearly in complete sentences without any shortness of breath.  Alert and oriented x3.  Normal judgment. No apparent acute distress.    Impression and Recommendations:    Primary focal hyperhidrosis, axilla She did not tolerate the aluminum chloride well.  We discussed using the Glycopyrronium topical as a secondary option.  She is open to trying that.  We also discussed Botox but again she feels like it is probably can be too costly.  I will check to see if RN of our dermatology offices in the area are offering the [facility] or something similar.  Sleep walking Walking has also started after having her Covid vaccine this is really unusual she is never really had this before over her lifetime even in childhood.  This is  highly unusual.  She did have a recent medication change to primidone but she says it was after she was experiencing symptoms so I do not think this is the cause otherwise there is nothing else new.  She feels like she sleeps okay overall.  We could evaluate for underlying sleep apnea and possible Brain lesion   Orders Placed This Encounter  Procedures  . Estradiol  . Progesterone  . Hemoglobin A1c  . Follicle stimulating hormone  . Luteinizing hormone  . COMPLETE METABOLIC PANEL WITH GFR      Time spent in encounter 21 minutes  I discussed the assessment and treatment plan with the patient.  The patient was provided an opportunity to ask questions and all were answered. The patient agreed with the plan and demonstrated an understanding of the instructions.   The patient was advised to call back or seek an in-person evaluation if the symptoms worsen or if the condition fails to improve as anticipated.   Beatrice Lecher, MD

## 2020-06-20 NOTE — Assessment & Plan Note (Signed)
She did not tolerate the aluminum chloride well.  We discussed using the Glycopyrronium topical as a secondary option.  She is open to trying that.  We also discussed Botox but again she feels like it is probably can be too costly.  I will check to see if RN of our dermatology offices in the area are offering the [facility] or something similar.

## 2020-06-24 DIAGNOSIS — R7301 Impaired fasting glucose: Secondary | ICD-10-CM | POA: Diagnosis not present

## 2020-06-24 DIAGNOSIS — R69 Illness, unspecified: Secondary | ICD-10-CM | POA: Diagnosis not present

## 2020-06-24 DIAGNOSIS — L74510 Primary focal hyperhidrosis, axilla: Secondary | ICD-10-CM | POA: Diagnosis not present

## 2020-06-25 DIAGNOSIS — R69 Illness, unspecified: Secondary | ICD-10-CM | POA: Diagnosis not present

## 2020-06-25 DIAGNOSIS — L74510 Primary focal hyperhidrosis, axilla: Secondary | ICD-10-CM | POA: Diagnosis not present

## 2020-06-25 DIAGNOSIS — R7301 Impaired fasting glucose: Secondary | ICD-10-CM | POA: Diagnosis not present

## 2020-06-25 LAB — HEMOGLOBIN A1C
Hgb A1c MFr Bld: 5.8 % of total Hgb — ABNORMAL HIGH (ref <5.7)
Mean Plasma Glucose: 120 mg/dL
eAG (mmol/L): 6.6 mmol/L

## 2020-06-25 LAB — PROGESTERONE: Progesterone: <0.5 ng/mL

## 2020-06-25 LAB — ESTRADIOL: Estradiol: 18 pg/mL

## 2020-06-26 ENCOUNTER — Encounter: Payer: Self-pay | Admitting: Neurology

## 2020-06-26 ENCOUNTER — Encounter: Payer: Self-pay | Admitting: Family Medicine

## 2020-06-26 LAB — HEMOGLOBIN A1C
Hgb A1c MFr Bld: 5.8 % of total Hgb — ABNORMAL HIGH (ref <5.7)
Mean Plasma Glucose: 120 mg/dL
eAG (mmol/L): 6.6 mmol/L

## 2020-06-26 LAB — COMPLETE METABOLIC PANEL WITH GFR
AG Ratio: 1.6 (calc) (ref 1.0–2.5)
ALT: 15 U/L (ref 6–29)
AST: 17 U/L (ref 10–35)
Albumin: 4.2 g/dL (ref 3.6–5.1)
Alkaline phosphatase (APISO): 119 U/L (ref 37–153)
BUN: 16 mg/dL (ref 7–25)
CO2: 27 mmol/L (ref 20–32)
Calcium: 9.7 mg/dL (ref 8.6–10.4)
Chloride: 107 mmol/L (ref 98–110)
Creat: 0.8 mg/dL (ref 0.60–0.93)
GFR, Est African American: 85 mL/min/{1.73_m2} (ref >=60)
GFR, Est Non African American: 74 mL/min/{1.73_m2} (ref >=60)
Globulin: 2.7 g/dL (calc) (ref 1.9–3.7)
Glucose, Bld: 91 mg/dL (ref 65–139)
Potassium: 4.6 mmol/L (ref 3.5–5.3)
Sodium: 140 mmol/L (ref 135–146)
Total Bilirubin: 0.3 mg/dL (ref 0.2–1.2)
Total Protein: 6.9 g/dL (ref 6.1–8.1)

## 2020-06-26 LAB — ESTRADIOL: Estradiol: <15 pg/mL

## 2020-06-26 LAB — LUTEINIZING HORMONE: LH: 27.2 m[IU]/mL

## 2020-06-26 LAB — FOLLICLE STIMULATING HORMONE: FSH: 60.3 m[IU]/mL

## 2020-06-26 LAB — PROGESTERONE: Progesterone: <0.5 ng/mL

## 2020-06-26 NOTE — Telephone Encounter (Signed)
QBREXZA 2.4 % PADS  Is there an alternative?

## 2020-07-01 NOTE — Telephone Encounter (Signed)
I called [facility] and [place] and as of now none of these dermatologist offer this treatment only botox. I did speak with Dr. Wynona Meals and let her know. - CF

## 2020-07-21 ENCOUNTER — Ambulatory Visit (INDEPENDENT_AMBULATORY_CARE_PROVIDER_SITE_OTHER): Payer: Medicare HMO | Admitting: Family Medicine

## 2020-07-21 DIAGNOSIS — Z0000 Encounter for general adult medical examination without abnormal findings: Secondary | ICD-10-CM | POA: Diagnosis not present

## 2020-07-21 DIAGNOSIS — Z13820 Encounter for screening for osteoporosis: Secondary | ICD-10-CM

## 2020-07-21 DIAGNOSIS — Z780 Asymptomatic menopausal state: Secondary | ICD-10-CM | POA: Diagnosis not present

## 2020-07-21 NOTE — Patient Instructions (Addendum)
Bone Density Test A bone density test uses a type of X-ray to measure the amount of calcium and other minerals in a person's bones. It can measure bone density in the hip and the spine. The test is similar to having a regular X-ray. This test may also be called:  Bone densitometry.  Bone mineral density test.  Dual-energy X-ray absorptiometry (DEXA). You may have this test to:  Diagnose a condition that causes weak or thin bones (osteoporosis).  Screen you for osteoporosis.  Predict your risk for a broken bone (fracture).  Determine how well your osteoporosis treatment is working. Tell a health care provider about:  Any allergies you have.  All medicines you are taking, including vitamins, herbs, eye drops, creams, and over-the-counter medicines.  Any problems you or family members have had with anesthetic medicines.  Any blood disorders you have.  Any surgeries you have had.  Any medical conditions you have.  Whether you are pregnant or may be pregnant.  Any medical tests you have had within the past 14 days that used contrast material. What are the risks? Generally, this is a safe test. However, it does expose you to a small amount of radiation, which can slightly increase your cancer risk. What happens before the test?  Do not take any calcium supplements within the 24 hours before your test.  You will need to remove all metal jewelry, eyeglasses, removable dental appliances, and any other metal objects on your body. What happens during the test?  You will lie down on an exam table. There will be an X-ray generator below you and an imaging device above you.  Other devices, such as boxes or braces, may be used to position your body properly for the scan.  The machine will slowly scan your body. You will need to keep very still while the machine does the scan.  The images will show up on a screen in the room. Images will be examined by a specialist after your  test is finished. The procedure may vary among health care providers and hospitals.   What can I expect after the test? It is up to you to get the results of your test. Ask your health care provider, or the department that is doing the test, when your results will be ready. Summary  A bone density test is an imaging test that uses a type of X-ray to measure the amount of calcium and other minerals in your bones.  The test may be used to diagnose or screen you for a condition that causes weak or thin bones (osteoporosis), predict your risk for a broken bone (fracture), or determine how well your osteoporosis treatment is working.  Do not take any calcium supplements within 24 hours before your test.  Ask your health care provider, or the department that is doing the test, when your results will be ready. This information is not intended to replace advice given to you by your health care provider. Make sure you discuss any questions you have with your health care provider. Document Revised: 10/18/2019 Document Reviewed: 10/18/2019 Elsevier Patient Education  [place] Maintenance, Female Adopting a healthy lifestyle and getting preventive care are important in promoting health and wellness. Ask your health care provider about:  The right schedule for you to have regular tests and exams.  Things you can do on your own to prevent diseases and keep yourself healthy. What should I know about diet, weight, and exercise?  Eat a healthy diet  Eat a diet that includes plenty of vegetables, fruits, low-fat dairy products, and lean protein.  Do not eat a lot of foods that are high in solid fats, added sugars, or sodium.   Maintain a healthy weight Body mass index (BMI) is used to identify weight problems. It estimates body fat based on height and weight. Your health care provider can help determine your BMI and help you achieve or maintain a healthy weight. Get regular  exercise Get regular exercise. This is one of the most important things you can do for your health. Most adults should:  Exercise for at least 150 minutes each week. The exercise should increase your heart rate and make you sweat (moderate-intensity exercise).  Do strengthening exercises at least twice a week. This is in addition to the moderate-intensity exercise.  Spend less time sitting. Even light physical activity can be beneficial. Watch cholesterol and blood lipids Have your blood tested for lipids and cholesterol at 73 years of age, then have this test every 5 years. Have your cholesterol levels checked more often if:  Your lipid or cholesterol levels are high.  You are older than 73 years of age.  You are at high risk for heart disease. What should I know about cancer screening? Depending on your health history and family history, you may need to have cancer screening at various ages. This may include screening for:  Breast cancer.  Cervical cancer.  Colorectal cancer.  Skin cancer.  Lung cancer. What should I know about heart disease, diabetes, and high blood pressure? Blood pressure and heart disease  High blood pressure causes heart disease and increases the risk of stroke. This is more likely to develop in people who have high blood pressure readings, are of African descent, or are overweight.  Have your blood pressure checked: ? Every 3-5 years if you are 2-3 years of age. ? Every year if you are 12 years old or older. Diabetes Have regular diabetes screenings. This checks your fasting blood sugar level. Have the screening done:  Once every three years after age 19 if you are at a normal weight and have a low risk for diabetes.  More often and at a younger age if you are overweight or have a high risk for diabetes. What should I know about preventing infection? Hepatitis B If you have a higher risk for hepatitis B, you should be screened for this virus.  Talk with your health care provider to find out if you are at risk for hepatitis B infection. Hepatitis C Testing is recommended for:  Everyone born from [birth year] through 1963.  Anyone with known risk factors for hepatitis C. Sexually transmitted infections (STIs)  Get screened for STIs, including gonorrhea and chlamydia, if: ? You are sexually active and are younger than 73 years of age. ? You are older than 73 years of age and your health care provider tells you that you are at risk for this type of infection. ? Your sexual activity has changed since you were last screened, and you are at increased risk for chlamydia or gonorrhea. Ask your health care provider if you are at risk.  Ask your health care provider about whether you are at high risk for HIV. Your health care provider may recommend a prescription medicine to help prevent HIV infection. If you choose to take medicine to prevent HIV, you should first get tested for HIV. You should then be tested every 3 months for  as long as you are taking the medicine. Pregnancy  If you are about to stop having your period (premenopausal) and you may become pregnant, seek counseling before you get pregnant.  Take 400 to 800 micrograms (mcg) of folic acid every day if you become pregnant.  Ask for birth control (contraception) if you want to prevent pregnancy. Osteoporosis and menopause Osteoporosis is a disease in which the bones lose minerals and strength with aging. This can result in bone fractures. If you are 81 years old or older, or if you are at risk for osteoporosis and fractures, ask your health care provider if you should:  Be screened for bone loss.  Take a calcium or vitamin D supplement to lower your risk of fractures.  Be given hormone replacement therapy (HRT) to treat symptoms of menopause. Follow these instructions at home: Lifestyle  Do not use any products that contain nicotine or tobacco, such as cigarettes, e-cigarettes,  and chewing tobacco. If you need help quitting, ask your health care provider.  Do not use street drugs.  Do not share needles.  Ask your health care provider for help if you need support or information about quitting drugs. Alcohol use  Do not drink alcohol if: ? Your health care provider tells you not to drink. ? You are pregnant, may be pregnant, or are planning to become pregnant.  If you drink alcohol: ? Limit how much you use to 0-1 drink a day. ? Limit intake if you are breastfeeding.  Be aware of how much alcohol is in your drink. In the [place] one drink equals one 12 oz bottle of beer (355 mL), one 5 oz glass of wine (148 mL), or one 1 oz glass of hard liquor (44 mL). General instructions  Schedule regular health, dental, and eye exams.  Stay current with your vaccines.  Tell your health care provider if: ? You often feel depressed. ? You have ever been abused or do not feel safe at home. Summary  Adopting a healthy lifestyle and getting preventive care are important in promoting health and wellness.  Follow your health care provider's instructions about healthy diet, exercising, and getting tested or screened for diseases.  Follow your health care provider's instructions on monitoring your cholesterol and blood pressure. This information is not intended to replace advice given to you by your health care provider. Make sure you discuss any questions you have with your health care provider. Document Revised: 04/26/2018 Document Reviewed: 04/26/2018 Elsevier Patient Education  2021 [place] Maintenance Summary and Written Plan of Care  Michelle Donovan ,  Thank you for allowing me to perform your Medicare Annual Wellness Visit and for your ongoing commitment to your health.   Health Maintenance & Immunization History Health Maintenance  Topic Date Due  . COVID-19 Vaccine (3 - Moderna risk 4-dose series) 08/06/2020  (Originally 01/10/2020)  . INFLUENZA VACCINE  08/14/2020 (Originally 12/16/2019)  . Fecal DNA (Cologuard)  09/30/2020 (Originally 02/18/2019)  . PNA vac Low Risk Adult (1 of 2 - PCV13) 09/30/2024 (Originally 12/18/2012)  . TETANUS/TDAP  11/26/2020  . MAMMOGRAM  01/23/2022  . DEXA SCAN  Completed  . Hepatitis C Screening  Completed  . HPV VACCINES  Aged Out   Immunization History  Administered Date(s) Administered  . Moderna Sars-Covid-2 Vaccination 11/15/2019, 12/13/2019    These are the patient goals that we discussed: Goals Addressed  This Visit's Progress   .  Patient Stated (pt-stated)        07/21/2020 AWV Goal: Exercise for General Health   Patient will verbalize understanding of the benefits of increased physical activity:  Exercising regularly is important. It will improve your overall fitness, flexibility, and endurance.  Regular exercise also will improve your overall health. It can help you control your weight, reduce stress, and improve your bone density.  Over the next year, patient will increase physical activity as tolerated with a goal of at least 150 minutes of moderate physical activity per week.   You can tell that you are exercising at a moderate intensity if your heart starts beating faster and you start breathing faster but can still hold a conversation.  Moderate-intensity exercise ideas include:  Walking 1 mile (1.6 km) in about 15 minutes  Biking  Hiking  Golfing  Dancing  Water aerobics  Patient will verbalize understanding of everyday activities that increase physical activity by providing examples like the following: ? Yard work, such as: ? Pushing a Conservation officer, nature ? Raking and bagging leaves ? Washing your car ? Pushing a stroller ? Shoveling snow ? Gardening ? Washing windows or floors  Patient will be able to explain general safety guidelines for exercising:   Before you start a new exercise program, talk with your health  care provider.  Do not exercise so much that you hurt yourself, feel dizzy, or get very short of breath.  Wear comfortable clothes and wear shoes with good support.  Drink plenty of water while you exercise to prevent dehydration or heat stroke.  Work out until your breathing and your heartbeat get faster.         This is a list of Health Maintenance Items that are overdue or due now: There are no preventive care reminders to display for this patient.   Orders/Referrals Placed Today: Orders Placed This Encounter  Procedures  . DEXAScan    Standing Status:   Future    Standing Expiration Date:   07/21/2021    Scheduling Instructions:     Please call patient to schedule    Order Specific Question:   Reason for exam:    Answer:   screening for osteoporosis    Order Specific Question:   Preferred imaging location?    Answer:   [facility]   (Contact our referral department at [phone] if you have not spoken with someone about your referral appointment within the next 5 days)    Follow-up Plan . Follow-up with Hali Marry, MD as planned . Referral has been sent for your dexa scan and they will call you to schedule. . Please let the office know if you need help with the SNAP application.  . Medicare wellness exam in one year.

## 2020-07-21 NOTE — Progress Notes (Signed)
MEDICARE ANNUAL WELLNESS VISIT  07/21/2020  Telephone Visit Disclaimer This Medicare AWV was conducted by telephone due to national recommendations for restrictions regarding the COVID-19 Pandemic (e.g. social distancing).  I verified, using two identifiers, that I am speaking with Michelle Donovan or their authorized healthcare agent. I discussed the limitations, risks, security, and privacy concerns of performing an evaluation and management service by telephone and the potential availability of an in-person appointment in the future. The patient expressed understanding and agreed to proceed.  Location of Patient: Home Location of Provider (nurse):  In the office.  Subjective:    Michelle Donovan is a 75 y.o. female patient of Metheney, Rene Kocher, MD who had a Medicare Annual Wellness Visit today via telephone. Michelle Donovan is Retired and lives alone. she has 1 child. she reports that she is socially active and does interact with friends/family regularly. she is minimally physically active and enjoys watching tv, planting flowers, reading and word search puzzles.  Patient Care Team: Hali Marry, MD as PCP - General (Family Medicine) Katherine Mantle, [place] as Consulting Physician (Optometry) Tat, Eustace Quail, DO as Consulting Physician (Neurology) Trula Slade, DPM as Consulting Physician (Podiatry)  Advanced Directives 07/21/2020 04/21/2020 02/19/2020 08/27/2019 02/26/2019 01/26/2019 08/24/2018  Does Patient Have a Medical Advance Directive? No No No No No No No  Would patient like information on creating a medical advance directive? No - Patient declined - - - No - Patient declined No - Patient declined No - Patient declined    Hospital Utilization Over the Past 12 Months: # of hospitalizations or ER visits: 0 # of surgeries: 1  Review of Systems    Patient reports that her overall health is unchanged compared to last year.  History obtained from chart review and the  patient  Patient Reported Readings (BP, Pulse, CBG, Weight, etc) none  Pain Assessment Pain : No/denies pain     Current Medications & Allergies (verified) Allergies as of 07/21/2020      Reactions   Tylenol [acetaminophen] Other (See Comments)   Increased liver enzymes   Codeine Nausea And Vomiting      Medication List       Accurate as of July 21, 2020 12:51 PM. If you have any questions, ask your nurse or doctor.        CALCIUM 1000 + D PO Take 1 tablet by mouth daily. Doesn't take it everyday   cetirizine 10 MG tablet Commonly known as: ZYRTEC Take 10 mg by mouth daily.   clonazePAM 0.5 MG tablet Commonly known as: KLONOPIN Take 1/2 tablet twice a day   ipratropium 0.06 % nasal spray Commonly known as: ATROVENT Place 2 sprays into both nostrils 2 (two) times daily.   montelukast 10 MG tablet Commonly known as: SINGULAIR TAKE 1 TABLET BY MOUTH EVERYDAY AT BEDTIME   primidone 50 MG tablet Commonly known as: MYSOLINE Take 1 tablet (50 mg total) by mouth at bedtime.   propranolol 60 MG tablet Commonly known as: INDERAL TAKE 1 TABLET (60 MG TOTAL) BY MOUTH 2 (TWO) TIMES DAILY.   Qbrexza 2.4 % Pads Generic drug: Glycopyrronium Tosylate APPLY 1 APPLICATION TOPICALLY DAILY   topiramate 50 MG tablet Commonly known as: TOPAMAX Take 50 mg by mouth daily.   valACYclovir 1000 MG tablet Commonly known as: VALTREX Take 2 tabs every 12 hours x 1 day as needed for cold sores       History (reviewed): Past Medical History:  Diagnosis Date  .  Allergic rhinitis, cause unspecified   . Asthma    not bad per pt  . Dermatophytosis of scalp and beard   . Diverticulosis   . Essential and other specified forms of tremor   . Fatty liver   . Fatty liver disease, nonalcoholic 1660  . GERD (gastroesophageal reflux disease)   . HTN (hypertension)    history of  . Irritable bowel syndrome   . Lumbago    bulging disk per MRI   . Mixed hyperlipidemia   . Other  acute reactions to stress   . Other diseases of lung, not elsewhere classified    solitary pulm. nodule(left)  . PONV (postoperative nausea and vomiting)   . Thyroid nodule   . Unspecified asthma(493.90)   . Unspecified vitamin D deficiency    Past Surgical History:  Procedure Laterality Date  . ABDOMINAL HYSTERECTOMY    . BUNIONECTOMY Bilateral 08/2019  . CHOLECYSTECTOMY N/A 08/23/2016   Procedure: LAPAROSCOPIC CHOLECYSTECTOMY;  Surgeon: Clovis Riley, MD;  Location: [place];  Service: General;  Laterality: N/A;  . COLONOSCOPY    . COLONOSCOPY WITH PROPOFOL N/A 01/26/2019   Procedure: COLONOSCOPY WITH PROPOFOL;  Surgeon: Jonathon Bellows, MD;  Location[facility] ENDOSCOPY;  Service: Gastroenterology;  Laterality: N/A;  . ESOPHAGOGASTRODUODENOSCOPY (EGD) WITH PROPOFOL N/A 03/09/2017   Procedure: ESOPHAGOGASTRODUODENOSCOPY (EGD) WITH PROPOFOL;  Surgeon: Jonathon Bellows, MD;  Location: [facility] ENDOSCOPY;  Service: Gastroenterology;  Laterality: N/A;  . LAPAROSCOPIC TOTAL HYSTERECTOMY  2002  . LAPAROSCOPIC UNILATERAL SALPINGO OOPHERECTOMY    . TONSILLECTOMY  1967/68  . TUBAL LIGATION  1986   Family History  Problem Relation Age of Onset  . Stroke Mother   . Tremor Mother   . Diabetes Mother   . Heart failure Mother   . Other Father        Trigeminal neuralgia  . Diabetes Sister   . Coronary artery disease Sister   . Irritable bowel syndrome Sister   . Tremor Sister   . Diabetes Brother        x 3  . Coronary artery disease Brother        x 3  . Irritable bowel syndrome Brother        x 3  . Tremor Brother        x 3  . Breast cancer Other        Aunts and cousin  . Colon cancer Maternal Grandfather    Social History   Socioeconomic History  . Marital status: Divorced    Spouse name: Not on file  . Number of children: 1  . Years of education: 12th grade  . Highest education level: High school graduate  Occupational History  . Occupation: retired    Fish farm manager: OTHER    Comment:  [employer]: [employer]  . Occupation: retired    Fish farm manager: [employer]  Tobacco Use  . Smoking status: Former Smoker    Types: Cigarettes    Quit date: 05/17/1980    Years since quitting: 40.2  . Smokeless tobacco: Never Used  Vaping Use  . Vaping Use: Never used  Substance and Sexual Activity  . Alcohol use: No  . Drug use: No  . Sexual activity: Never  Other Topics Concern  . Not on file  Social History Narrative   Divorced      1 daughter-healthy      No regular exercise      Social Determinants of Health   Financial  Resource Strain: Low Risk   . Difficulty of Paying Living Expenses: Not hard at all  Food Insecurity: Food Insecurity Present  . Worried About Charity fundraiser in the Last Year: Sometimes true  . Ran Out of Food in the Last Year: Sometimes true  Transportation Needs: No Transportation Needs  . Lack of Transportation (Medical): No  . Lack of Transportation (Non-Medical): No  Physical Activity: Inactive  . Days of Exercise per Week: 0 days  . Minutes of Exercise per Session: 0 min  Stress: No Stress Concern Present  . Feeling of Stress : Not at all  Social Connections: Socially Isolated  . Frequency of Communication with Friends and Family: Twice a week  . Frequency of Social Gatherings with Friends and Family: Never  . Attends Religious Services: Never  . Active Member of Clubs or Organizations: No  . Attends Archivist Meetings: Never  . Marital Status: Divorced    Activities of Daily Living In your present state of health, do you have any difficulty performing the following activities: 07/21/2020  Hearing? N  Vision? N  Difficulty concentrating or making decisions? N  Walking or climbing stairs? N  Dressing or bathing? N  Doing errands, shopping? N  Preparing Food and eating ? N  Using the Toilet? N  In the past six months, have you accidently leaked urine? Y  Comment feels that her bladder has dropped and wants  to be referred to a urology  Do you have problems with loss of bowel control? N  Managing your Medications? N  Managing your Finances? N  Housekeeping or managing your Housekeeping? N  Some recent data might be hidden    Patient Education/ Literacy How often do you need to have someone help you when you read instructions, pamphlets, or other written materials from your doctor or pharmacy?: 1 - Never What is the last grade level you completed in school?: 12th grade  Exercise Current Exercise Habits: The patient does not participate in regular exercise at present, Exercise limited by: neurologic condition(s) (recent bunion surgery; still adjusting and having nerve problems.)  Diet Patient reports consuming 0 meals a day and 4 snack(s) a day Patient reports that her primary diet is: Regular Patient reports that she does have regular access to food.   Depression Screen PHQ 2/9 Scores 07/21/2020 04/02/2020 12/21/2019 03/07/2018 02/23/2017 11/12/2016 01/23/2016  PHQ - 2 Score 0 0 0 0 2 0 0  PHQ- 9 Score 0 - 0 - 4 - -     Fall Risk Fall Risk  07/21/2020 04/21/2020 02/19/2020 08/27/2019 02/26/2019  Falls in the past year? 0 0 1 1 0  Number falls in past yr: 0 0 0 0 0  Injury with Fall? 0 0 0 0 0  Risk for fall due to : No Fall Risks - - - -  Follow up Falls evaluation completed - - - -     Objective:  Michelle Donovan seemed alert and oriented and she participated appropriately during our telephone visit.  Blood Pressure Weight BMI  BP Readings from Last 3 Encounters:  04/02/20 (!) 122/48  02/19/20 (!) 138/97  12/21/19 (!) 110/41   Wt Readings from Last 3 Encounters:  06/20/20 150 lb (68 kg)  04/21/20 150 lb (68 kg)  04/02/20 150 lb (68 kg)   BMI Readings from Last 1 Encounters:  06/20/20 24.96 kg/m    *Unable to obtain current vital signs, weight, and BMI due to telephone visit  type  Hearing/Vision  . Kayti did not seem to have difficulty with hearing/understanding during the  telephone conversation . Reports that she has had a formal eye exam by an eye care professional within the past year . Reports that she has not had a formal hearing evaluation within the past year *Unable to fully assess hearing and vision during telephone visit type  Cognitive Function: 6CIT Screen 07/21/2020  What Year? 0 points  What month? 0 points  What time? 0 points  Count back from 20 0 points  Months in reverse 0 points  Repeat phrase 0 points  Total Score 0   (Normal:0-7, Significant for Dysfunction: >8)  Normal Cognitive Function Screening: Yes   Immunization & Health Maintenance Record Immunization History  Administered Date(s) Administered  . Moderna Sars-Covid-2 Vaccination 11/15/2019, 12/13/2019    Health Maintenance  Topic Date Due  . COVID-19 Vaccine (3 - Moderna risk 4-dose series) 08/06/2020 (Originally 01/10/2020)  . INFLUENZA VACCINE  08/14/2020 (Originally 12/16/2019)  . Fecal DNA (Cologuard)  09/30/2020 (Originally 02/18/2019)  . PNA vac Low Risk Adult (1 of 2 - PCV13) 09/30/2024 (Originally 12/18/2012)  . TETANUS/TDAP  11/26/2020  . MAMMOGRAM  01/23/2022  . DEXA SCAN  Completed  . Hepatitis C Screening  Completed  . HPV VACCINES  Aged Out       Assessment  This is a routine wellness examination for Deona Novitski.  Health Maintenance: Due or Overdue There are no preventive care reminders to display for this patient.  Michelle Donovan does not need a referral for Community Assistance: Care Management:   no Social Work:    no Prescription Assistance:  no Nutrition/Diabetes Education:  no   Plan:  Personalized Goals Goals Addressed              This Visit's Progress   .  Patient Stated (pt-stated)        07/21/2020 AWV Goal: Exercise for General Health   Patient will verbalize understanding of the benefits of increased physical activity:  Exercising regularly is important. It will improve your overall fitness, flexibility, and  endurance.  Regular exercise also will improve your overall health. It can help you control your weight, reduce stress, and improve your bone density.  Over the next year, patient will increase physical activity as tolerated with a goal of at least 150 minutes of moderate physical activity per week.   You can tell that you are exercising at a moderate intensity if your heart starts beating faster and you start breathing faster but can still hold a conversation.  Moderate-intensity exercise ideas include:  Walking 1 mile (1.6 km) in about 15 minutes  Biking  Hiking  Golfing  Dancing  Water aerobics  Patient will verbalize understanding of everyday activities that increase physical activity by providing examples like the following: ? Yard work, such as: ? Pushing a Conservation officer, nature ? Raking and bagging leaves ? Washing your car ? Pushing a stroller ? Shoveling snow ? Gardening ? Washing windows or floors  Patient will be able to explain general safety guidelines for exercising:   Before you start a new exercise program, talk with your health care provider.  Do not exercise so much that you hurt yourself, feel dizzy, or get very short of breath.  Wear comfortable clothes and wear shoes with good support.  Drink plenty of water while you exercise to prevent dehydration or heat stroke.  Work out until your breathing and your heartbeat get faster.  Personalized Health Maintenance & Screening Recommendations  Pneumococcal vaccine  Influenza vaccine Td vaccine Bone densitometry screening Shingrix  Lung Cancer Screening Recommended: no (Low Dose CT Chest recommended if Age 59-80 years, 30 pack-year currently smoking OR have quit w/in past 15 years) Hepatitis C Screening recommended: no HIV Screening recommended: no  Advanced Directives: Written information was not prepared per patient's request.  Referrals & Orders Orders Placed This Encounter  Procedures  .  [place]    Follow-up Plan . Follow-up with Hali Marry, MD as planned . Referral has been sent for your dexa scan and they will call you to schedule. . Please let the office know if you need help with the SNAP application.  . Medicare wellness exam in one year.   I have personally reviewed and noted the following in the patient's chart:   . Medical and social history . Use of alcohol, tobacco or illicit drugs  . Current medications and supplements . Functional ability and status . Nutritional status . Physical activity . Advanced directives . List of other physicians . Hospitalizations, surgeries, and ER visits in previous 12 months . Vitals . Screenings to include cognitive, depression, and falls . Referrals and appointments  In addition, I have reviewed and discussed with Michelle Donovan certain preventive protocols, quality metrics, and best practice recommendations. A written personalized care plan for preventive services as well as general preventive health recommendations is available and can be mailed to the patient at her request.      Tinnie Gens, RN  07/21/2020

## 2020-07-23 ENCOUNTER — Other Ambulatory Visit: Payer: Self-pay

## 2020-07-23 ENCOUNTER — Ambulatory Visit (INDEPENDENT_AMBULATORY_CARE_PROVIDER_SITE_OTHER): Payer: Medicare HMO

## 2020-07-23 DIAGNOSIS — Z13820 Encounter for screening for osteoporosis: Secondary | ICD-10-CM | POA: Diagnosis not present

## 2020-07-23 DIAGNOSIS — M85851 Other specified disorders of bone density and structure, right thigh: Secondary | ICD-10-CM | POA: Diagnosis not present

## 2020-07-23 DIAGNOSIS — Z780 Asymptomatic menopausal state: Secondary | ICD-10-CM

## 2020-07-23 DIAGNOSIS — Z0000 Encounter for general adult medical examination without abnormal findings: Secondary | ICD-10-CM | POA: Diagnosis not present

## 2020-08-19 NOTE — Progress Notes (Signed)
Assessment/Plan:    1.  Essential Tremor  Patient given the following instructions:  Week 1:  Decrease klonopin 0.5 mg, 1/2 tablet at bed only   Week 2:  STOP klonopin (clonazepam)   Week 3:  Decrease topamax to 50 mg daily   Increase primidone to 50 mg twice per day (if you find you are too sleepy with this, let me know and you can take both at night)   Week 4:  Decrease topamax to 50 mg, 1/2 tablet daily   Week 5:  Stop topamax  Continue propranolol, 60 mg twice per day.  She cannot go higher than this because of bradycardia   2.  B12 deficiency  -she has restarted her b12.  Was going to recheck but she restarted it so decided to hold on that.  3.  History of NASH  -Due to poor diet, but diet much better these days.  States that her appetite really has decreased, resulting in weight loss.  We will see what happens when the Topamax has been discontinued.  Subjective:   Michelle Donovan was seen today in follow up for essential tremor.  My previous records were reviewed prior to todays visit.  Last visit, we decreased the patient's topiramate and started her on primidone at very low dose, primarily because she had first dose effect in the past.  She states that she had "not one side effect."  Its helped.   She states today that she feels she is on too much medication.  She is off balance when she ambulates.  No falls.    Current prescribed movement disorder medications: propranolol, 60 mg twice per day. Primidone, 50 mg daily Topamax, 100 mg qd (decreased last visit) clonazepam 0.5 mg,1/2 bid (pt decreased since last visit).  Current/Previously tried tremor medications: topamax(tried in 2015/16 with success but had hair loss and stopped;retried again up to 200 mg and had hair loss again and she did not think it was effective and we stopped it;retried again in 2021 at patient request and felt that it was more effective although had hair loss); Metoprolol - felt  drained ; primidone (50 mg - 1/2 at night but got first dose effect and stopped it); meclizine (? Why); on propranolol now - ? Helping now; gabapentin; artane(no help)   ALLERGIES:   Allergies  Allergen Reactions  . Tylenol [Acetaminophen] Other (See Comments)    Increased liver enzymes  . Codeine Nausea And Vomiting    CURRENT MEDICATIONS:  Outpatient Encounter Medications as of 08/21/2020  Medication Sig  . Calcium Carb-Cholecalciferol (CALCIUM 1000 + D PO) Take 1 tablet by mouth daily. Doesn't take it everyday  . cetirizine (ZYRTEC) 10 MG tablet Take 10 mg by mouth daily.  . clonazePAM (KLONOPIN) 0.5 MG tablet Take 1/2 tablet twice a day  . ipratropium (ATROVENT) 0.06 % nasal spray Place 2 sprays into both nostrils 2 (two) times daily.  . montelukast (SINGULAIR) 10 MG tablet TAKE 1 TABLET BY MOUTH EVERYDAY AT BEDTIME  . primidone (MYSOLINE) 50 MG tablet Take 1 tablet (50 mg total) by mouth at bedtime.  . propranolol (INDERAL) 60 MG tablet TAKE 1 TABLET (60 MG TOTAL) BY MOUTH 2 (TWO) TIMES DAILY.  . QBREXZA 2.4 % PADS APPLY 1 APPLICATION TOPICALLY DAILY  . topiramate (TOPAMAX) 100 MG tablet Take 100 mg by mouth daily.  . valACYclovir (VALTREX) 1000 MG tablet Take 2 tabs every 12 hours x 1 day as needed for cold sores  No facility-administered encounter medications on file as of 08/21/2020.     Objective:    PHYSICAL EXAMINATION:    VITALS:   Vitals:   08/21/20 1524  BP: 116/62  Pulse: 62  SpO2: 98%  Weight: 146 lb (66.2 kg)  Height: 5\' 5"  (1.651 m)   Wt Readings from Last 3 Encounters:  08/21/20 146 lb (66.2 kg)  06/20/20 150 lb (68 kg)  04/21/20 150 lb (68 kg)     GEN:  The patient appears stated age and is in NAD. HEENT:  Normocephalic, atraumatic.  The mucous membranes are moist. The superficial temporal arteries are without ropiness or tenderness. CV:  RRR Lungs:  CTAB Neck/HEME:  There are no carotid bruits bilaterally.  Neurological  examination:  Orientation: The patient is alert and oriented x3. Cranial nerves: There is good facial symmetry. The speech is fluent and clear. Soft palate rises symmetrically and there is no tongue deviation. Hearing is intact to conversational tone. Sensation: Sensation is intact to light touch throughout Motor: Strength is at least antigravity x4.  Movement examination: Tone: There is normal tone in the UE/LE Abnormal movements: She has very little tremor of the outstretched hands and very little intention tremor.  She does have some tremor with Archimedes spirals.  Some head tremor in the yes direction but it is mild and intermittent Gait and Station: The patient has no difficulty arising out of a deep-seated chair without the use of the hands. The patient's stride length is good I have reviewed and interpreted the following labs independently   Chemistry      Component Value Date/Time   NA 140 06/25/2020 1534   K 4.6 06/25/2020 1534   CL 107 06/25/2020 1534   CO2 27 06/25/2020 1534   BUN 16 06/25/2020 1534   CREATININE 0.80 06/25/2020 1534      Component Value Date/Time   CALCIUM 9.7 06/25/2020 1534   ALKPHOS 109 12/25/2018 1002   AST 17 06/25/2020 1534   ALT 15 06/25/2020 1534   BILITOT 0.3 06/25/2020 1534      Lab Results  Component Value Date   WBC 7.8 12/21/2019   HGB 11.9 12/21/2019   HCT 36.6 12/21/2019   MCV 86.1 12/21/2019   PLT 217 12/21/2019   Lab Results  Component Value Date   TSH 0.59 12/21/2019     Chemistry      Component Value Date/Time   NA 140 06/25/2020 1534   K 4.6 06/25/2020 1534   CL 107 06/25/2020 1534   CO2 27 06/25/2020 1534   BUN 16 06/25/2020 1534   CREATININE 0.80 06/25/2020 1534      Component Value Date/Time   CALCIUM 9.7 06/25/2020 1534   ALKPHOS 109 12/25/2018 1002   AST 17 06/25/2020 1534   ALT 15 06/25/2020 1534   BILITOT 0.3 06/25/2020 1534      Lab Results  Component Value Date   VITAMINB12 444 12/25/2018       Total time spent on today's visit was 30 minutes, including both face-to-face time and nonface-to-face time.  Time included that spent on review of records (prior notes available to me/labs/imaging if pertinent), discussing treatment and goals, answering patient's questions and coordinating care.  Cc:  Hali Marry, MD

## 2020-08-21 ENCOUNTER — Encounter: Payer: Self-pay | Admitting: Neurology

## 2020-08-21 ENCOUNTER — Other Ambulatory Visit: Payer: Self-pay

## 2020-08-21 ENCOUNTER — Ambulatory Visit: Payer: Medicare HMO | Admitting: Neurology

## 2020-08-21 VITALS — BP 116/62 | HR 62 | Ht 65.0 in | Wt 146.0 lb

## 2020-08-21 DIAGNOSIS — C44519 Basal cell carcinoma of skin of other part of trunk: Secondary | ICD-10-CM | POA: Diagnosis not present

## 2020-08-21 DIAGNOSIS — D225 Melanocytic nevi of trunk: Secondary | ICD-10-CM | POA: Diagnosis not present

## 2020-08-21 DIAGNOSIS — L814 Other melanin hyperpigmentation: Secondary | ICD-10-CM | POA: Diagnosis not present

## 2020-08-21 DIAGNOSIS — L738 Other specified follicular disorders: Secondary | ICD-10-CM | POA: Diagnosis not present

## 2020-08-21 DIAGNOSIS — L570 Actinic keratosis: Secondary | ICD-10-CM | POA: Diagnosis not present

## 2020-08-21 DIAGNOSIS — D485 Neoplasm of uncertain behavior of skin: Secondary | ICD-10-CM | POA: Diagnosis not present

## 2020-08-21 DIAGNOSIS — D2261 Melanocytic nevi of right upper limb, including shoulder: Secondary | ICD-10-CM | POA: Diagnosis not present

## 2020-08-21 DIAGNOSIS — Z85828 Personal history of other malignant neoplasm of skin: Secondary | ICD-10-CM | POA: Diagnosis not present

## 2020-08-21 DIAGNOSIS — G250 Essential tremor: Secondary | ICD-10-CM | POA: Diagnosis not present

## 2020-08-21 DIAGNOSIS — L821 Other seborrheic keratosis: Secondary | ICD-10-CM | POA: Diagnosis not present

## 2020-08-21 DIAGNOSIS — D1801 Hemangioma of skin and subcutaneous tissue: Secondary | ICD-10-CM | POA: Diagnosis not present

## 2020-08-21 DIAGNOSIS — L308 Other specified dermatitis: Secondary | ICD-10-CM | POA: Diagnosis not present

## 2020-08-21 MED ORDER — PRIMIDONE 50 MG PO TABS: 50.0000 mg | ORAL_TABLET | Freq: Two times a day (BID) | ORAL | 1 refills | Status: DC

## 2020-08-21 NOTE — Patient Instructions (Signed)
Week 1: Decrease klonopin 0.5 mg, 1/2 tablet at bed only  Week 2: STOP klonopin (clonazepam)  Week 3: Decrease topamax to 50 mg daily  Increase primidone to 50 mg twice per day (if you find you are too sleepy with this, let me know and you can take both at night)  Week 4: Decrease topamax to 50 mg, 1/2 tablet daily  Week 5: Stop topamax

## 2020-09-02 ENCOUNTER — Encounter: Payer: Self-pay | Admitting: Family Medicine

## 2020-09-03 DIAGNOSIS — H6980 Other specified disorders of Eustachian tube, unspecified ear: Secondary | ICD-10-CM | POA: Diagnosis not present

## 2020-09-03 DIAGNOSIS — J300 Vasomotor rhinitis: Secondary | ICD-10-CM | POA: Diagnosis not present

## 2020-09-03 DIAGNOSIS — H9313 Tinnitus, bilateral: Secondary | ICD-10-CM | POA: Diagnosis not present

## 2020-09-04 ENCOUNTER — Other Ambulatory Visit: Payer: Self-pay | Admitting: Family Medicine

## 2020-09-04 ENCOUNTER — Encounter: Payer: Self-pay | Admitting: Family Medicine

## 2020-09-04 ENCOUNTER — Ambulatory Visit (INDEPENDENT_AMBULATORY_CARE_PROVIDER_SITE_OTHER): Payer: Medicare HMO | Admitting: Family Medicine

## 2020-09-04 ENCOUNTER — Other Ambulatory Visit: Payer: Self-pay

## 2020-09-04 VITALS — BP 119/55 | HR 59 | Temp 98.0°F | Resp 17

## 2020-09-04 DIAGNOSIS — N8110 Cystocele, unspecified: Secondary | ICD-10-CM | POA: Diagnosis not present

## 2020-09-04 DIAGNOSIS — R300 Dysuria: Secondary | ICD-10-CM | POA: Diagnosis not present

## 2020-09-04 LAB — POCT URINALYSIS DIP (CLINITEK)
Bilirubin, UA: NEGATIVE
Blood, UA: NEGATIVE
Glucose, UA: NEGATIVE mg/dL
Ketones, POC UA: NEGATIVE mg/dL
Nitrite, UA: NEGATIVE
POC PROTEIN,UA: NEGATIVE
Spec Grav, UA: 1.015 (ref 1.010–1.025)
Urobilinogen, UA: 0.2 E.U./dL
pH, UA: 6 (ref 5.0–8.0)

## 2020-09-04 MED ORDER — NITROFURANTOIN MONOHYD MACRO 100 MG PO CAPS: 100.0000 mg | ORAL_CAPSULE | Freq: Two times a day (BID) | ORAL | 0 refills | Status: AC

## 2020-09-04 NOTE — Progress Notes (Signed)
Acute Office Visit  Subjective:    Patient ID: Michelle Donovan, female    DOB: 07-30-1945, 75 y.o.   MRN: [id number]  Chief Complaint  Patient presents with  . Urinary Tract Infection    HPI Patient is in today for possible UTI.  For the past 5 days patient has had some suprapubic pain and difficulty with stream and emptying bladder.  States she really has felt off for about the past 2 weeks or so in general, she could not really pinpoint what was wrong.  She states her suprapubic pain can get as high as 8 out of 10.  States it comes and goes randomly throughout the day with no specific triggers or relievers.  She is not necessarily having any burning with urination or frequency but she is having small voids and feeling like she cannot empty her bladder all the way.  Suprapubic pain is her most uncomfortable symptom.  She denies any odor, hematuria, abdominal pain, back pain, flank pain, fever, nausea, vomiting, diarrhea.  She has not yet tried anything at home for this.  States she did have a UTI few months ago that felt similar to this.  Also reports she had a total hysterectomy many years ago and believes she is having some bladder prolapse issues.  She asked Dr. Charise Carwin for a urology referral several months ago which was placed but she has not heard from them yet.  I will replace order today.    Past Medical History:  Diagnosis Date  . Allergic rhinitis, cause unspecified   . Asthma    not bad per pt  . Dermatophytosis of scalp and beard   . Diverticulosis   . Essential and other specified forms of tremor   . Fatty liver   . Fatty liver disease, nonalcoholic 6301  . GERD (gastroesophageal reflux disease)   . HTN (hypertension)    history of  . Irritable bowel syndrome   . Lumbago    bulging disk per MRI   . Mixed hyperlipidemia   . Other acute reactions to stress   . Other diseases of lung, not elsewhere classified    solitary pulm. nodule(left)  . PONV (postoperative  nausea and vomiting)   . Thyroid nodule   . Unspecified asthma(493.90)   . Unspecified vitamin D deficiency     Past Surgical History:  Procedure Laterality Date  . ABDOMINAL HYSTERECTOMY    . BUNIONECTOMY Bilateral 08/2019  . CHOLECYSTECTOMY N/A 08/23/2016   Procedure: LAPAROSCOPIC CHOLECYSTECTOMY;  Surgeon: Clovis Riley, MD;  Location: [facility];  Service: General;  Laterality: N/A;  . COLONOSCOPY    . COLONOSCOPY WITH PROPOFOL N/A 01/26/2019   Procedure: COLONOSCOPY WITH PROPOFOL;  Surgeon: Jonathon Bellows, MD;  Location: [facility] ENDOSCOPY;  Service: Gastroenterology;  Laterality: N/A;  . ESOPHAGOGASTRODUODENOSCOPY (EGD) WITH PROPOFOL N/A 03/09/2017   Procedure: ESOPHAGOGASTRODUODENOSCOPY (EGD) WITH PROPOFOL;  Surgeon: Jonathon Bellows, MD;  Location: [facility] ENDOSCOPY;  Service: Gastroenterology;  Laterality: N/A;  . LAPAROSCOPIC TOTAL HYSTERECTOMY  2002  . LAPAROSCOPIC UNILATERAL SALPINGO OOPHERECTOMY    . TONSILLECTOMY  1967/68  . TUBAL LIGATION  1986    Family History  Problem Relation Age of Onset  . Stroke Mother   . Tremor Mother   . Diabetes Mother   . Heart failure Mother   . Other Father        Trigeminal neuralgia  . Diabetes Sister   . Coronary artery disease Sister   . Irritable bowel syndrome Sister   .  Tremor Sister   . Diabetes Brother        x 3  . Coronary artery disease Brother        x 3  . Irritable bowel syndrome Brother        x 3  . Tremor Brother        x 3  . Breast cancer Other        Aunts and cousin  . Colon cancer Maternal Grandfather     Social History   Socioeconomic History  . Marital status: Divorced    Spouse name: Not on file  . Number of children: 1  . Years of education: 12th grade  . Highest education level: High school graduate  Occupational History  . Occupation: retired    Fish farm manager: OTHER    Comment: [place]: [place]  . Occupation: retired    Fish farm manager: [place] [employer]  Tobacco Use  . Smoking status: Former  Smoker    Types: Cigarettes    Quit date: 05/17/1980    Years since quitting: 40.3  . Smokeless tobacco: Never Used  Vaping Use  . Vaping Use: Never used  Substance and Sexual Activity  . Alcohol use: No  . Drug use: No  . Sexual activity: Never  Other Topics Concern  . Not on file  Social History Narrative   Divorced      1 daughter-healthy      No regular exercise      Social Determinants of Health   Financial Resource Strain: Low Risk   . Difficulty of Paying Living Expenses: Not hard at all  Food Insecurity: Food Insecurity Present  . Worried About Charity fundraiser in the Last Year: Sometimes true  . Ran Out of Food in the Last Year: Sometimes true  Transportation Needs: No Transportation Needs  . Lack of Transportation (Medical): No  . Lack of Transportation (Non-Medical): No  Physical Activity: Inactive  . Days of Exercise per Week: 0 days  . Minutes of Exercise per Session: 0 min  Stress: No Stress Concern Present  . Feeling of Stress : Not at all  Social Connections: Socially Isolated  . Frequency of Communication with Friends and Family: Twice a week  . Frequency of Social Gatherings with Friends and Family: Never  . Attends Religious Services: Never  . Active Member of Clubs or Organizations: No  . Attends Archivist Meetings: Never  . Marital Status: Divorced  Human resources officer Violence: Not At Risk  . Fear of Current or Ex-Partner: No  . Emotionally Abused: No  . Physically Abused: No  . Sexually Abused: No    Outpatient Medications Prior to Visit  Medication Sig Dispense Refill  . Calcium Carb-Cholecalciferol (CALCIUM 1000 + D PO) Take 1 tablet by mouth daily. Doesn't take it everyday    . cetirizine (ZYRTEC) 10 MG tablet Take 10 mg by mouth daily.    Marland Kitchen ipratropium (ATROVENT) 0.06 % nasal spray Place 2 sprays into both nostrils 2 (two) times daily.    . montelukast (SINGULAIR) 10 MG tablet TAKE 1 TABLET BY MOUTH EVERYDAY AT BEDTIME 90  tablet 3  . primidone (MYSOLINE) 50 MG tablet Take 1 tablet (50 mg total) by mouth in the morning and at bedtime. 180 tablet 1  . propranolol (INDERAL) 60 MG tablet TAKE 1 TABLET (60 MG TOTAL) BY MOUTH 2 (TWO) TIMES DAILY. 180 tablet 0  . QBREXZA 2.4 % PADS APPLY 1 APPLICATION TOPICALLY  DAILY 30 each PRN  . valACYclovir (VALTREX) 1000 MG tablet Take 2 tabs every 12 hours x 1 day as needed for cold sores 20 tablet 1   No facility-administered medications prior to visit.    Allergies  Allergen Reactions  . Tylenol [Acetaminophen] Other (See Comments)    Increased liver enzymes  . Codeine Nausea And Vomiting    Review of Systems All review of systems negative except what is listed in the HPI     Objective:    Physical Exam Vitals reviewed.  Constitutional:      Appearance: Normal appearance. She is normal weight.  HENT:     Head: Normocephalic and atraumatic.  Cardiovascular:     Rate and Rhythm: Normal rate.     Pulses: Normal pulses.     Heart sounds: Normal heart sounds.  Pulmonary:     Effort: Pulmonary effort is normal.     Breath sounds: Normal breath sounds.  Abdominal:     General: There is no distension.     Palpations: Abdomen is soft.     Tenderness: There is no abdominal tenderness. There is no right CVA tenderness or left CVA tenderness.  Skin:    General: Skin is warm and dry.  Neurological:     General: No focal deficit present.     Mental Status: She is alert and oriented to person, place, and time. Mental status is at baseline.  Psychiatric:        Mood and Affect: Mood normal.        Behavior: Behavior normal.        Thought Content: Thought content normal.        Judgment: Judgment normal.     BP (!) 119/55   Pulse (!) 59   Temp 98 F (36.7 C)   Resp 17   SpO2 100%  Wt Readings from Last 3 Encounters:  08/21/20 146 lb (66.2 kg)  06/20/20 150 lb (68 kg)  04/21/20 150 lb (68 kg)    Health Maintenance Due  Topic Date Due  . COVID-19  Vaccine (3 - Moderna risk 4-dose series) 01/10/2020    There are no preventive care reminders to display for this patient.   Lab Results  Component Value Date   TSH 0.59 12/21/2019   Lab Results  Component Value Date   WBC 7.8 12/21/2019   HGB 11.9 12/21/2019   HCT 36.6 12/21/2019   MCV 86.1 12/21/2019   PLT 217 12/21/2019   Lab Results  Component Value Date   NA 140 06/25/2020   K 4.6 06/25/2020   CO2 27 06/25/2020   GLUCOSE 91 06/25/2020   BUN 16 06/25/2020   CREATININE 0.80 06/25/2020   BILITOT 0.3 06/25/2020   ALKPHOS 109 12/25/2018   AST 17 06/25/2020   ALT 15 06/25/2020   PROT 6.9 06/25/2020   ALBUMIN 3.8 12/25/2018   CALCIUM 9.7 06/25/2020   ANIONGAP 9 05/13/2018   GFR 78.59 12/25/2018   Lab Results  Component Value Date   CHOL 223 (H) 12/21/2019   Lab Results  Component Value Date   HDL 36 (L) 12/21/2019   Lab Results  Component Value Date   LDLCALC 158 (H) 12/21/2019   Lab Results  Component Value Date   TRIG 156 (H) 12/21/2019   Lab Results  Component Value Date   CHOLHDL 6.2 (H) 12/21/2019   Lab Results  Component Value Date   HGBA1C 5.8 (H) 06/25/2020       Assessment &  Plan:   1. Dysuria UA in office today with trace leukocytes.  I will go ahead and treat with Macrobid and send culture.  Will make changes to plan of care based on culture results and let patient know as needed.  We discussed causes of urinary tract infections and importance of hygiene, hydration, cotton underwear.  Educated on signs and symptoms that would require further evaluation. - POCT URINALYSIS DIP (CLINITEK) - Ambulatory referral to Urology - nitrofurantoin, macrocrystal-monohydrate, (MACROBID) 100 MG capsule; Take 1 capsule (100 mg total) by mouth 2 (two) times daily for 5 days.  Dispense: 10 capsule; Refill: 0 - Urine Culture  2. Female bladder prolapse Patient would like a new urology referral placed since she has not heard from the previous one last  November. - Ambulatory referral to Urology  Follow-up if symptoms worsen or fail to improve.  Terrilyn Saver, NP

## 2020-09-06 ENCOUNTER — Other Ambulatory Visit: Payer: Self-pay | Admitting: Neurology

## 2020-09-07 ENCOUNTER — Other Ambulatory Visit: Payer: Self-pay | Admitting: Family Medicine

## 2020-09-08 LAB — URINE CULTURE
MICRO NUMBER:: 11800042
Result:: NO GROWTH
SPECIMEN QUALITY:: ADEQUATE

## 2020-09-08 LAB — HOUSE ACCOUNT TRACKING

## 2020-09-09 NOTE — Progress Notes (Signed)
MyChart message sent: Urine culture was negative. Please let us know if you are not feeling any better.

## 2020-09-29 ENCOUNTER — Telehealth: Payer: Medicare HMO | Admitting: Physician Assistant

## 2020-09-29 ENCOUNTER — Encounter: Payer: Self-pay | Admitting: Physician Assistant

## 2020-09-29 ENCOUNTER — Telehealth (INDEPENDENT_AMBULATORY_CARE_PROVIDER_SITE_OTHER): Payer: Medicare HMO | Admitting: Physician Assistant

## 2020-09-29 DIAGNOSIS — G250 Essential tremor: Secondary | ICD-10-CM | POA: Diagnosis not present

## 2020-09-29 DIAGNOSIS — R531 Weakness: Secondary | ICD-10-CM

## 2020-09-29 DIAGNOSIS — R110 Nausea: Secondary | ICD-10-CM

## 2020-09-29 DIAGNOSIS — R432 Parageusia: Secondary | ICD-10-CM | POA: Diagnosis not present

## 2020-09-29 DIAGNOSIS — R6883 Chills (without fever): Secondary | ICD-10-CM | POA: Diagnosis not present

## 2020-09-29 MED ORDER — OMEPRAZOLE 40 MG PO CPDR: 40.0000 mg | DELAYED_RELEASE_CAPSULE | Freq: Every day | ORAL | 2 refills | Status: DC

## 2020-09-29 NOTE — Progress Notes (Signed)
Patient ID: Michelle Donovan, female   DOB: July 20, 1945, 75 y.o.   MRN: [id number] .Marland KitchenVirtual Visit via Video Note  I connected with Bently Morath on 09/30/20 at  1:00 PM EDT by a video enabled telemedicine application and verified that I am speaking with the correct person using two identifiers.  Not able to get video to work and used only telephone.   Location: Patient: home Provider: clinic  .Marland KitchenParticipating in visit:  Patient: [place] Provider: Iran Planas PA-C   I discussed the limitations of evaluation and management by telemedicine and the availability of in person appointments. The patient expressed understanding and agreed to proceed.  History of Present Illness: Patient is a 75 year old female with essential tremor who presents to the clinic with concerns.  Upon questioning find out she is not truly felt the same since her neurologist switched up her essential tremor medications.  She was tapered off clonazepam and Topamax over the past month.  That taper started about mid April.  She immediately started noticing a nervous feeling inside and her tremor worsening.  Her primidone was increased to 50 mg twice a day and she was kept on propanolol.  She has felt weak and not herself for weeks but this Friday she started feeling worse.  She complains of cough, sinus drainage, chills, body aches, no appetite and nausea.  The nausea has worsened but has been present for the last month.  She also admits to a headache.  She denies any fever, diarrhea, constipation.she lost her taste today. She has neurologist but not scheduled to see her until September, Dr. Wells Guiles Tat.   .. Active Ambulatory Problems    Diagnosis Date Noted  . Vitamin D deficiency 09/19/2008  . HYPERLIPIDEMIA 02/06/2007  . Situational anxiety 03/06/2009  . Benign essential hypertension 07/17/2009  . Allergic rhinitis 02/06/2007  . Asthma, mild persistent 02/06/2007  . 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  IRRITABLE BOWEL SYNDROME 02/06/2007  . LOW BACK PAIN, CHRONIC 02/06/2007  . B12 deficiency 10/26/2010  . Varicosities of leg 07/07/2011  . Ulnar nerve compression 12/07/2012  . Constipation, chronic 04/17/2013  . Essential tremor 07/19/2013  . Bilateral arm pain 08/08/2013  . Multiple thyroid nodules 08/29/2013  . Elevated LFTs 10/05/2013  . Fatty liver 10/05/2013  . Fatigue 11/05/2014  . Insomnia 11/05/2014  . 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 Dysphagia 02/01/2017  . ETD (Eustachian tube dysfunction), bilateral 10/04/2017  . Right-sided chest wall pain 03/07/2018  . Edema of left ankle 07/03/2018  . History of basal cell cancer 06/01/2019  . Bunion of unspecified foot 08/22/2019  . Night sweats 12/21/2019  . IFG (impaired fasting glucose) 12/21/2019  . 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 Sleep walking 06/20/2020  . Weakness 09/30/2020  . Nausea 09/30/2020  . Chills 09/30/2020  . Loss of taste 09/30/2020   Resolved Ambulatory Problems    Diagnosis Date Noted  . Myalgia 10/26/2010  . Weight loss 10/26/2010  . Elbow pain 11/17/2010  . Leg swelling 07/07/2011  . ETD (eustachian tube dysfunction) 08/04/2011  . Skin lesion of left leg 09/08/2011  . 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 Rash and nonspecific skin eruption 03/14/2012  . Chronic cough 12/07/2012  . Other malaise and fatigue 12/07/2012  . Urinary urgency 12/07/2012  . Abdominal pain, epigastric 04/17/2013  . Acute neck pain 08/08/2013  . Conjunctivitis 10/05/2013  . Nasal congestion 11/23/2013  . Alopecia 02/08/2014  . Eustachian tube dysfunction 05/07/2014  . Viral URI 05/07/2014  .  Acute right hip pain 07/09/2014  . Bilateral arm weakness 11/05/2014  . Influenza 08/13/2015  . Acute diverticulitis 09/22/2015  . Gallstones 07/20/2016  . Pain in or around eye, left 08/02/2016  . Epistaxis 11/16/2017  . Leg pain 07/21/2018  . Ear fullness, right 09/14/2018   Past Medical History:   Diagnosis Date  . Allergic rhinitis, cause unspecified   . Asthma   . Dermatophytosis of scalp and beard   . Diverticulosis   . Essential and other specified forms of tremor   . Fatty liver disease, nonalcoholic 0093  . GERD (gastroesophageal reflux disease)   . HTN (hypertension)   . Other acute reactions to stress   . PONV (postoperative nausea and vomiting)   . Thyroid nodule   . Unspecified asthma(493.90)   . Unspecified vitamin D deficiency        Observations/Objective: No acute distress Normal breathing Anxious sounding voice  Not able to get vitals.   Assessment and Plan: Marland KitchenMarland KitchenRamla was seen today for cough.  Diagnoses and all orders for this visit:  Loss of taste -     Novel Coronavirus, NAA ([facility])  Essential tremor  Chills -     Novel Coronavirus, NAA ([facility])  Nausea -     omeprazole (PRILOSEC) 40 MG capsule; Take 1 capsule (40 mg total) by mouth daily. -     Novel Coronavirus, NAA ([facility])  Weakness   Unclear etiology of symptoms.  Her first round of symptoms seem like they were in reference to her medications changed by neurology but her new symptoms seem like there could be an acute viral illness complicating the ongoing symptoms. Patient is vaccinated but I would like to get some COVID testing to know since she has lost taste. We can track some of her change in how she felt back to when she stopped clonazepam.  This is something both her PCP and neurology really wanted her off of.  I do think a follow-up with neurology would be beneficial.  I am concerned that some of her symptoms could be the increase in primidone.  I would suggest cutting back to 50 mg once a day of that to see if symptoms improve. Start omeprazole to see if nausea could be some reflux.  Consider mucinex, flonase for URI symptoms. Follow up as needed or with new symptoms.  Consider blood work if not improving or worsening.     Follow Up Instructions:    I discussed the  assessment and treatment plan with the patient. The patient was provided an opportunity to ask questions and all were answered. The patient agreed with the plan and demonstrated an understanding of the instructions.   The patient was advised to call back or seek an in-person evaluation if the symptoms worsen or if the condition fails to improve as anticipated.  I provided 30 minutes of non-face-to-face time during this encounter.   Iran Planas, PA-C

## 2020-09-29 NOTE — Progress Notes (Signed)
Started Friday: Cough  Drainage Chills No appetite (has been going on a long while - off and on for "several months", feels like insides are "quivering")  Headache Nausea (no vomiting)  Today - started losing taste  Hasn't taken any medications for symptoms  Does not have any means to take any vitals

## 2020-09-30 ENCOUNTER — Telehealth: Payer: Self-pay | Admitting: Neurology

## 2020-09-30 DIAGNOSIS — R110 Nausea: Secondary | ICD-10-CM | POA: Insufficient documentation

## 2020-09-30 DIAGNOSIS — R6883 Chills (without fever): Secondary | ICD-10-CM | POA: Insufficient documentation

## 2020-09-30 DIAGNOSIS — R531 Weakness: Secondary | ICD-10-CM | POA: Insufficient documentation

## 2020-09-30 DIAGNOSIS — R432 Parageusia: Secondary | ICD-10-CM | POA: Insufficient documentation

## 2020-09-30 NOTE — Telephone Encounter (Signed)
Patient's PCP's office called in wanting to see if the patient's primidone can be changed. The patient was there for a visit and stated she "has not felt the same since the medication was changed". Please give the patient a call back.

## 2020-09-30 NOTE — Telephone Encounter (Signed)
Patient is on the wait list and a note was made that we could do a virtual visit

## 2020-09-30 NOTE — Telephone Encounter (Signed)
Just put her on cx list.  VV is fine.  That is what her PCP did

## 2020-10-01 LAB — SPECIMEN STATUS REPORT

## 2020-10-01 LAB — SARS-COV-2, NAA 2 DAY TAT

## 2020-10-01 LAB — NOVEL CORONAVIRUS, NAA: SARS-CoV-2, NAA: DETECTED — AB

## 2020-10-01 NOTE — Progress Notes (Signed)
Michelle Donovan,   Confirmed covid. We will base start date of symptoms from Friday so you are already out of your quarantine but wear a mask for another 5 days. You are out of window for antiviral. Symptomatic care. Follow up if having worsening of symptoms or concerns. Marland Kitchen.Vitamin D3 5000 IU (125 mcg) daily Vitamin C 500 mg twice daily Zinc 50 to 75 mg daily If not already.

## 2020-10-02 ENCOUNTER — Telehealth: Payer: Self-pay

## 2020-10-02 NOTE — Telephone Encounter (Signed)
Called to discuss with patient about COVID-19 symptoms and the use of one of the available treatments for those with mild to moderate Covid symptoms and at a high risk of hospitalization.  Pt appears to qualify for outpatient treatment due to co-morbid conditions and/or a member of an at-risk group in accordance with the FDA Emergency Use Authorization.    Symptom onset: 09/26/20 Chills,weakness,loss of taste Vaccinated: Yes Booster? No Immunocompromised? No Qualifiers: HTN NIH Criteria: Tier 1  Declines further treatment.   Michelle Donovan

## 2020-10-06 ENCOUNTER — Telehealth: Payer: Self-pay | Admitting: Family Medicine

## 2020-10-06 NOTE — Chronic Care Management (AMB) (Signed)
  Chronic Care Management   Note  10/06/2020 Name: Riona Lahti MRN: [id number] DOB: 02-02-1946  Ernesteen Mihalic is a 75 y.o. year old female who is a primary care patient of Madilyn Fireman, Rene Kocher, MD. I reached out to [name] by phone today in response to a referral sent by Ms. Harrel Carina Housman's PCP, Hali Marry, MD.   Ms. Closser was given information about Chronic Care Management services today including:  1. CCM service includes personalized support from designated clinical staff supervised by her physician, including individualized plan of care and coordination with other care providers 2. 24/7 contact phone numbers for assistance for urgent and routine care needs. 3. Service will only be billed when office clinical staff spend 20 minutes or more in a month to coordinate care. 4. Only one practitioner may furnish and bill the service in a calendar month. 5. The patient may stop CCM services at any time (effective at the end of the month) by phone call to the office staff.   Patient agreed to services and verbal consent obtained.   Follow up plan:   Lauretta Grill Upstream Scheduler

## 2020-10-16 ENCOUNTER — Other Ambulatory Visit: Payer: Self-pay | Admitting: Family Medicine

## 2020-10-17 NOTE — Progress Notes (Signed)
Virtual Visit Via Video   The purpose of this virtual visit is to provide medical care while limiting exposure to the novel coronavirus.    Consent was obtained for video visit:  Yes.   Answered questions that patient had about telehealth interaction:  Yes.   I discussed the limitations, risks, security and privacy concerns of performing an evaluation and management service by telemedicine. I also discussed with the patient that there may be a patient responsible charge related to this service. The patient expressed understanding and agreed to proceed.  Pt location: Home Physician Location: office Name of referring provider:  Hali Marry, * I connected with Michelle Donovan at patients initiation/request on 10/20/2020 at  8:45 AM EDT by video enabled telemedicine application and verified that I am speaking with the correct person using two identifiers. Pt MRN:  [id number] Pt DOB:  [birth year] Video Participants:  Michelle Donovan;    Assessment/Plan:   1.  Essential tremor  -doesn't want surgical interventions  -she has restarted her  klonopin, 0.5 mg, 1/2 po bid.  For now, I will increase klonopin to 0.5 mg, 1 in the AM, 1/2 in the PM.  She understands risks of that  -lots of room to further increase primidone in the future and will discuss this further at next visit.  She is very resistant to that and is pretty clear that she doesn't want to do that.  Told her that I am not sure that she has many other options. Explained that this med has been shown to be effective for ET up to 750 mg and she is only on 100 mg daily.   She thinks that it decreases her appetite and just doesn't think she could tolerate that increase.  For now, she was willing to continue primidone, 50 mg twice per day.  -Continue on propranolol, 60 mg twice per day.  History of bradycardia prevents further increase.  -discussed with her only other option I see is zonegran, which is similar to topamax.  Can  further discuss at f/u in September.    2.  B12 deficiency  -probably needs to restart b12.  She has a hx of deficiency and is c/o post covid fatigue   Subjective   Patient worked in today to discuss tremor.  Last visit, patient was feeling like she was on too much medication and really wanted to start weaning off some of her medications.  We took her off of Topamax.  She had already decreased her clonazepam, but we ultimately weaned her off of this as well.  She admits that she has been "sneaking it back lately." - taking 1/2 po bid about a week ago but not helping that much.   We did slightly increase her primidone to 50 mg twice per day.  Unfortunately, patient's tremor has gotten markedly worse, so much so that it is interfering with activities of daily living such as eating.  Having to eat with 2 hands.  She also got COVID about 3 weeks ago.  She is recovering from that, but tremor has not recovered.  She also still has voice trouble and fatigue.  Current movement d/o meds:  Primidone, 50 mg twice per day Propranolol, 60 mg twice per day (unable to increase because of bradycardia) b12  Current/Previously tried tremor medications: topamax(tried in 2015/16 with success but had hair loss and stopped;retried again up to 200 mg and had hair loss again and she did not think it was effective  and we stopped it;retried again in 2021 at patient request and felt that it was more effective although had hair loss); Metoprolol - felt drained ; primidone (50 mg - 1/2 at night but got first dose effect and stopped it); meclizine (? Why); on propranolol now - ? Helping now; gabapentin; artane(no help)   Current Outpatient Medications on File Prior to Visit  Medication Sig Dispense Refill  . cetirizine (ZYRTEC) 10 MG tablet Take 10 mg by mouth daily.    Marland Kitchen ipratropium (ATROVENT) 0.06 % nasal spray Place 2 sprays into both nostrils 2 (two) times daily.    . montelukast (SINGULAIR) 10 MG tablet TAKE 1  TABLET BY MOUTH EVERYDAY AT BEDTIME 90 tablet 3  . omeprazole (PRILOSEC) 40 MG capsule Take 1 capsule (40 mg total) by mouth daily. 30 capsule 2  . primidone (MYSOLINE) 50 MG tablet Take 1 tablet (50 mg total) by mouth in the morning and at bedtime. 180 tablet 1  . propranolol (INDERAL) 60 MG tablet TAKE 1 TABLET BY MOUTH 2 TIMES DAILY. 180 tablet 0  . valACYclovir (VALTREX) 1000 MG tablet Take 2 tabs every 12 hours x 1 day as needed for cold sores 20 tablet 1  . Calcium Carb-Cholecalciferol (CALCIUM 1000 + D PO) Take 1 tablet by mouth daily. Doesn't take it everyday (Patient not taking: No sig reported)     No current facility-administered medications on file prior to visit.     Objective   Vitals:   10/20/20 0751  Weight: 147 lb (66.7 kg)  Height: 5\' 5"  (1.651 m)   GEN:  The patient appears stated age and is in NAD.  Neurological examination:  Orientation: The patient is alert and oriented x3. Cranial nerves: There is good facial symmetry. There is no facial hypomimia.  The speech is fluent and clear.  Movement examination: Tone: unable Abnormal movements: head tremor in the "yes" direction     Follow up Instructions      -I discussed the assessment and treatment plan with the patient. The patient was provided an opportunity to ask questions and all were answered. The patient agreed with the plan and demonstrated an understanding of the instructions.   The patient was advised to call back or seek an in-person evaluation if the symptoms worsen or if the condition fails to improve as anticipated.    Total time spent on today's visit was 20 minutes, including both face-to-face time and nonface-to-face time.  Time included that spent on review of records (prior notes available to me/labs/imaging if pertinent), discussing treatment and goals, answering patient's questions and coordinating care.   Alonza Bogus, DO

## 2020-10-20 ENCOUNTER — Telehealth (INDEPENDENT_AMBULATORY_CARE_PROVIDER_SITE_OTHER): Payer: Medicare HMO | Admitting: Neurology

## 2020-10-20 ENCOUNTER — Encounter: Payer: Self-pay | Admitting: Neurology

## 2020-10-20 ENCOUNTER — Other Ambulatory Visit: Payer: Self-pay

## 2020-10-20 VITALS — Ht 65.0 in | Wt 147.0 lb

## 2020-10-20 DIAGNOSIS — G250 Essential tremor: Secondary | ICD-10-CM | POA: Diagnosis not present

## 2020-10-20 MED ORDER — CLONAZEPAM 0.5 MG PO TABS: ORAL_TABLET | ORAL | 3 refills | Status: DC

## 2020-10-23 ENCOUNTER — Encounter: Payer: Self-pay | Admitting: Family Medicine

## 2020-10-23 ENCOUNTER — Ambulatory Visit (INDEPENDENT_AMBULATORY_CARE_PROVIDER_SITE_OTHER): Payer: Medicare HMO | Admitting: Family Medicine

## 2020-10-23 ENCOUNTER — Other Ambulatory Visit: Payer: Self-pay

## 2020-10-23 VITALS — BP 122/63 | HR 67 | Ht 65.0 in | Wt 142.0 lb

## 2020-10-23 DIAGNOSIS — M546 Pain in thoracic spine: Secondary | ICD-10-CM

## 2020-10-23 DIAGNOSIS — R630 Anorexia: Secondary | ICD-10-CM

## 2020-10-23 DIAGNOSIS — R1011 Right upper quadrant pain: Secondary | ICD-10-CM

## 2020-10-23 DIAGNOSIS — F5089 Other specified eating disorder: Secondary | ICD-10-CM | POA: Diagnosis not present

## 2020-10-23 DIAGNOSIS — R69 Illness, unspecified: Secondary | ICD-10-CM | POA: Diagnosis not present

## 2020-10-23 NOTE — Progress Notes (Signed)
Established Patient Office Visit  Subjective:  Patient ID: Michelle Donovan, female    DOB: Nov 29, 1945  Age: 75 y.o. MRN: [id number]  CC:  Chief Complaint  Patient presents with   Abdominal Pain    HPI Michelle Donovan presents for RUQ radiating into her back.  She describes the pain as feeling "bruised".  Hx of fatty liver.  Hx of Abd CT 10/2019.  Negative.  CT done at that time for similar pain.  Over the last year she has really had significantly decreased appetite she says at 1 point she was extremely nauseated.  But that actually seems to have gotten better but she still has lack of appetite and has been gradually losing weight.  She says the pain in that right upper quadrant area is there every single day.  It is not constant it does come and go it seems to be worse with movement and with lying down on that side.  Does report that she sometimes does have some midthoracic back pain as well.  She denies any other recent GI symptoms.  Past Medical History:  Diagnosis Date   Allergic rhinitis, cause unspecified    Asthma    not bad per pt   Dermatophytosis of scalp and beard    Diverticulosis    Essential and other specified forms of tremor    Fatty liver    Fatty liver disease, nonalcoholic 5427   GERD (gastroesophageal reflux disease)    HTN (hypertension)    history of   Irritable bowel syndrome    Lumbago    bulging disk per MRI    Mixed hyperlipidemia    Other acute reactions to stress    Other diseases of lung, not elsewhere classified    solitary pulm. nodule(left)   PONV (postoperative nausea and vomiting)    Thyroid nodule    Unspecified asthma(493.90)    Unspecified vitamin D deficiency     Past Surgical History:  Procedure Laterality Date   ABDOMINAL HYSTERECTOMY     BUNIONECTOMY Bilateral 08/2019   CHOLECYSTECTOMY N/A 08/23/2016   Procedure: LAPAROSCOPIC CHOLECYSTECTOMY;  Surgeon: Clovis Riley, MD;  Location: [place];  Service: General;   Laterality: N/A;   COLONOSCOPY     COLONOSCOPY WITH PROPOFOL N/A 01/26/2019   Procedure: COLONOSCOPY WITH PROPOFOL;  Surgeon: Jonathon Bellows, MD;  Location: [facility] ENDOSCOPY;  Service: Gastroenterology;  Laterality: N/A;   ESOPHAGOGASTRODUODENOSCOPY (EGD) WITH PROPOFOL N/A 03/09/2017   Procedure: ESOPHAGOGASTRODUODENOSCOPY (EGD) WITH PROPOFOL;  Surgeon: Jonathon Bellows, MD;  Location: [facility] ENDOSCOPY;  Service: Gastroenterology;  Laterality: N/A;   LAPAROSCOPIC TOTAL HYSTERECTOMY  2002   LAPAROSCOPIC UNILATERAL SALPINGO OOPHERECTOMY     TONSILLECTOMY  1967/68   TUBAL LIGATION  1986    Family History  Problem Relation Age of Onset   Stroke Mother    Tremor Mother    Diabetes Mother    Heart failure Mother    Other Father        Trigeminal neuralgia   Diabetes Sister    Coronary artery disease Sister    Irritable bowel syndrome Sister    Tremor Sister    Diabetes Brother        x 3   Coronary artery disease Brother        x 3   Irritable bowel syndrome Brother        x 3   Tremor Brother        x 3   Breast cancer Other  Aunts and cousin   Colon cancer Maternal Grandfather     Social History   Socioeconomic History   Marital status: Divorced    Spouse name: Not on file   Number of children: 1   Years of education: 12th grade   Highest education level: High school graduate  Occupational History   Occupation: retired    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   Occupation: retired    Fish farm manager: [employer]  Tobacco Use   Smoking status: Former    Pack years: 0.00    Types: Cigarettes    Quit date: 05/17/1980    Years since quitting: 40.4   Smokeless tobacco: Never  Vaping Use   Vaping Use: Never used  Substance and Sexual Activity   Alcohol use: No   Drug use: No   Sexual activity: Never  Other Topics Concern   Not on file  Social History Narrative   Divorced      1 daughter-healthy      No regular exercise      Right handed       Social Determinants of Health   Financial Resource Strain: Low Risk    Difficulty of Paying Living Expenses: Not hard at all  Food Insecurity: Food Insecurity Present   Worried About Charity fundraiser in the Last Year: Sometimes true   Arboriculturist in the Last Year: Sometimes true  Transportation Needs: No Transportation Needs   Lack of Transportation (Medical): No   Lack of Transportation (Non-Medical): No  Physical Activity: Inactive   Days of Exercise per Week: 0 days   Minutes of Exercise per Session: 0 min  Stress: No Stress Concern Present   Feeling of Stress : Not at all  Social Connections: Socially Isolated   Frequency of Communication with Friends and Family: Twice a week   Frequency of Social Gatherings with Friends and Family: Never   Attends Religious Services: Never   Marine scientist or Organizations: No   Attends Music therapist: Never   Marital Status: Divorced  Human resources officer Violence: Not At Risk   Fear of Current or Ex-Partner: No   Emotionally Abused: No   Physically Abused: No   Sexually Abused: No    Outpatient Medications Prior to Visit  Medication Sig Dispense Refill   Calcium Carb-Cholecalciferol (CALCIUM 1000 + D PO) Take 1 tablet by mouth daily. Doesn't take it everyday     cetirizine (ZYRTEC) 10 MG tablet Take 10 mg by mouth daily.     clonazePAM (KLONOPIN) 0.5 MG tablet 1 in the AM, 1/2 q hs 45 tablet 3   ipratropium (ATROVENT) 0.06 % nasal spray Place 2 sprays into both nostrils 2 (two) times daily.     montelukast (SINGULAIR) 10 MG tablet TAKE 1 TABLET BY MOUTH EVERYDAY AT BEDTIME 90 tablet 3   primidone (MYSOLINE) 50 MG tablet Take 1 tablet (50 mg total) by mouth in the morning and at bedtime. 180 tablet 1   propranolol (INDERAL) 60 MG tablet TAKE 1 TABLET BY MOUTH 2 TIMES DAILY. 180 tablet 0   valACYclovir (VALTREX) 1000 MG tablet Take 2 tabs every 12 hours x 1 day as needed for cold sores 20 tablet 1    omeprazole (PRILOSEC) 40 MG capsule Take 1 capsule (40 mg total) by mouth daily. 30 capsule 2   No facility-administered medications prior to visit.    Allergies  Allergen  Reactions   Tylenol [Acetaminophen] Other (See Comments)    Increased liver enzymes   Codeine Nausea And Vomiting    ROS Review of Systems    Objective:    Physical Exam Constitutional:      Appearance: She is well-developed.  HENT:     Head: Normocephalic and atraumatic.  Cardiovascular:     Rate and Rhythm: Normal rate and regular rhythm.     Heart sounds: Normal heart sounds.  Pulmonary:     Effort: Pulmonary effort is normal.     Breath sounds: Normal breath sounds.  Abdominal:     General: Bowel sounds are normal.     Tenderness: There is generalized abdominal tenderness. There is no guarding.  Skin:    General: Skin is warm and dry.  Neurological:     Mental Status: She is alert and oriented to person, place, and time.  Psychiatric:        Behavior: Behavior normal.    BP 122/63   Pulse 67   Ht 5\' 5"  (1.651 m)   Wt 142 lb (64.4 kg)   SpO2 96%   BMI 23.63 kg/m  Wt Readings from Last 3 Encounters:  10/23/20 142 lb (64.4 kg)  10/20/20 147 lb (66.7 kg)  08/21/20 146 lb (66.2 kg)     There are no preventive care reminders to display for this patient.  There are no preventive care reminders to display for this patient.  Lab Results  Component Value Date   TSH 0.59 12/21/2019   Lab Results  Component Value Date   WBC 7.8 12/21/2019   HGB 11.9 12/21/2019   HCT 36.6 12/21/2019   MCV 86.1 12/21/2019   PLT 217 12/21/2019   Lab Results  Component Value Date   NA 140 06/25/2020   K 4.6 06/25/2020   CO2 27 06/25/2020   GLUCOSE 91 06/25/2020   BUN 16 06/25/2020   CREATININE 0.80 06/25/2020   BILITOT 0.3 06/25/2020   ALKPHOS 109 12/25/2018   AST 17 06/25/2020   ALT 15 06/25/2020   PROT 6.9 06/25/2020   ALBUMIN 3.8 12/25/2018   CALCIUM 9.7 06/25/2020   ANIONGAP 9 05/13/2018    GFR 78.59 12/25/2018   Lab Results  Component Value Date   CHOL 223 (H) 12/21/2019   Lab Results  Component Value Date   HDL 36 (L) 12/21/2019   Lab Results  Component Value Date   LDLCALC 158 (H) 12/21/2019   Lab Results  Component Value Date   TRIG 156 (H) 12/21/2019   Lab Results  Component Value Date   CHOLHDL 6.2 (H) 12/21/2019   Lab Results  Component Value Date   HGBA1C 5.8 (H) 06/25/2020      Assessment & Plan:   Problem List Items Addressed This Visit   None Visit Diagnoses     RUQ pain    -  Primary   Relevant Orders   Fe+TIBC+Fer   Sedimentation rate   C-reactive protein   COMPLETE METABOLIC PANEL WITH GFR   Protein electrophoresis, serum   Protein Electrophoresis, with Total Protein and Reflex to IFE, Urine   CBC   Ambulatory referral to Gastroenterology   Acute midline thoracic back pain       Relevant Orders   Fe+TIBC+Fer   Sedimentation rate   C-reactive protein   COMPLETE METABOLIC PANEL WITH GFR   Protein electrophoresis, serum   Protein Electrophoresis, with Total Protein and Reflex to IFE, Urine   CBC   Pica  Relevant Orders   Fe+TIBC+Fer   Sedimentation rate   C-reactive protein   COMPLETE METABOLIC PANEL WITH GFR   Protein electrophoresis, serum   Protein Electrophoresis, with Total Protein and Reflex to IFE, Urine   CBC   Anorexia       Relevant Orders   Ambulatory referral to Gastroenterology       Right upper quadrant pain-based on exam and history I really strongly suspect that this is coming from her thoracic spine and is likely caused by some nerve impingement that is radiating around into that right upper quadrant area.  But she also has a history of significantly decreased appetite that is resulting in significant weight loss over the last year in addition to that right upper quadrant pain so would like her to see GI just to make sure that were not missing anything.  Thoracic back pain-would like to consider  orthopedic referral as well.  She wants to do the GI referral first and then work on Ortho once we rule out anything underlying.  The meantime I am can check a sed rate, SPEP UPEP and a CBC.  Anorexia/decreased appetite-she has lost a fair amount of weight over the last year.  I think it is really just from decreased caloric intake but I am concerned about why she has no appetite so would like to refer her to GI for further work-up.  Is having significant nausea for quite some time and that has been better recently but I am still concerned.  Is also been craving beef lately so we will check for iron deficiency.  No orders of the defined types were placed in this encounter.   Follow-up: Return in about 3 months (around 01/23/2021) for RUQ pain and weight loss.    Beatrice Lecher, MD

## 2020-10-27 LAB — CBC
HCT: 37.4 % (ref 35.0–45.0)
Hemoglobin: 12.1 g/dL (ref 11.7–15.5)
MCH: 27.9 pg (ref 27.0–33.0)
MCHC: 32.4 g/dL (ref 32.0–36.0)
MCV: 86.4 fL (ref 80.0–100.0)
MPV: 10.2 fL (ref 7.5–12.5)
Platelets: 209 10*3/uL (ref 140–400)
RBC: 4.33 10*6/uL (ref 3.80–5.10)
RDW: 13.1 % (ref 11.0–15.0)
WBC: 7.3 10*3/uL (ref 3.8–10.8)

## 2020-10-27 LAB — PROTEIN ELECTROPHORESIS, SERUM
Albumin ELP: 3.8 g/dL (ref 3.8–4.8)
Alpha 1: 0.4 g/dL — ABNORMAL HIGH (ref 0.2–0.3)
Alpha 2: 0.8 g/dL (ref 0.5–0.9)
Beta 2: 0.5 g/dL (ref 0.2–0.5)
Beta Globulin: 0.5 g/dL (ref 0.4–0.6)
Gamma Globulin: 0.8 g/dL (ref 0.8–1.7)
Total Protein: 6.8 g/dL (ref 6.1–8.1)

## 2020-10-27 LAB — COMPLETE METABOLIC PANEL WITH GFR
AG Ratio: 1.3 (calc) (ref 1.0–2.5)
ALT: 15 U/L (ref 6–29)
AST: 16 U/L (ref 10–35)
Albumin: 3.7 g/dL (ref 3.6–5.1)
Alkaline phosphatase (APISO): 95 U/L (ref 37–153)
BUN: 8 mg/dL (ref 7–25)
CO2: 29 mmol/L (ref 20–32)
Calcium: 9 mg/dL (ref 8.6–10.4)
Chloride: 104 mmol/L (ref 98–110)
Creat: 0.72 mg/dL (ref 0.60–0.93)
GFR, Est African American: 97 mL/min/{1.73_m2} (ref >=60)
GFR, Est Non African American: 84 mL/min/{1.73_m2} (ref >=60)
Globulin: 2.9 g/dL (calc) (ref 1.9–3.7)
Glucose, Bld: 93 mg/dL (ref 65–99)
Potassium: 4.5 mmol/L (ref 3.5–5.3)
Sodium: 139 mmol/L (ref 135–146)
Total Bilirubin: 0.3 mg/dL (ref 0.2–1.2)
Total Protein: 6.6 g/dL (ref 6.1–8.1)

## 2020-10-27 LAB — SEDIMENTATION RATE: Sed Rate: 56 mm/h — ABNORMAL HIGH (ref 0–30)

## 2020-10-27 LAB — IRON,TIBC AND FERRITIN PANEL
%SAT: 24 % (calc) (ref 16–45)
Ferritin: 121 ng/mL (ref 16–288)
Iron: 62 ug/dL (ref 45–160)
TIBC: 256 mcg/dL (calc) (ref 250–450)

## 2020-10-27 LAB — C-REACTIVE PROTEIN: CRP: 22.5 mg/L — ABNORMAL HIGH (ref <8.0)

## 2020-10-30 DIAGNOSIS — R634 Abnormal weight loss: Secondary | ICD-10-CM | POA: Diagnosis not present

## 2020-10-30 DIAGNOSIS — R630 Anorexia: Secondary | ICD-10-CM | POA: Diagnosis not present

## 2020-10-30 DIAGNOSIS — R1011 Right upper quadrant pain: Secondary | ICD-10-CM | POA: Diagnosis not present

## 2020-10-30 DIAGNOSIS — K5901 Slow transit constipation: Secondary | ICD-10-CM | POA: Diagnosis not present

## 2020-10-30 DIAGNOSIS — K7581 Nonalcoholic steatohepatitis (NASH): Secondary | ICD-10-CM | POA: Diagnosis not present

## 2020-11-04 MED ORDER — PRIMIDONE 50 MG PO TABS: 100.0000 mg | ORAL_TABLET | Freq: Two times a day (BID) | ORAL | 1 refills | Status: DC

## 2020-11-06 DIAGNOSIS — N3000 Acute cystitis without hematuria: Secondary | ICD-10-CM | POA: Insufficient documentation

## 2020-11-07 ENCOUNTER — Telehealth: Payer: Medicare HMO

## 2020-11-08 DIAGNOSIS — K760 Fatty (change of) liver, not elsewhere classified: Secondary | ICD-10-CM | POA: Diagnosis not present

## 2020-11-08 DIAGNOSIS — R1011 Right upper quadrant pain: Secondary | ICD-10-CM | POA: Diagnosis not present

## 2020-11-08 DIAGNOSIS — K7460 Unspecified cirrhosis of liver: Secondary | ICD-10-CM | POA: Diagnosis not present

## 2020-11-08 DIAGNOSIS — Z9049 Acquired absence of other specified parts of digestive tract: Secondary | ICD-10-CM | POA: Diagnosis not present

## 2020-11-08 DIAGNOSIS — K7581 Nonalcoholic steatohepatitis (NASH): Secondary | ICD-10-CM | POA: Diagnosis not present

## 2020-11-26 ENCOUNTER — Telehealth: Payer: Self-pay | Admitting: Neurology

## 2020-11-26 NOTE — Telephone Encounter (Signed)
Pt would like to see if Tat will see her sooner than September. Since upping her medicine, things have not gotten better. She said she cant write, and tremors seem to be the same stage as before.

## 2020-11-28 NOTE — Telephone Encounter (Signed)
Called patient and advised her of Dr. Doristine Devoid recommendations: "If shes not having side effects with the primidone (and shes on 2 tablets twice per day - confirm that because she would have JUST gotten up to that as we started that titration upward 6/20 so hasn't been long), she can go to primidone 50 mg, 3 in the AM, 2 at night (correct her med list).  Also, I think it would be a good idea if we get another movement opinion on her.  She doesn't want to do surgery and has tried and failed lots of meds.  If agreeable, refer to [facility] or [facility] movement disorder for Essential tremor."  Patient stated that yes she is taking Primidone 2 tablets twice per day. Patient stated that she really doesn't want to do another movement opinion and would like to do it as a last resort.  Patient wanted to know if Dr. Carles Collet would consider going "backwards" to her original regimen that she felt controled her tremors? Patient stated the regimen was 1 primidone, clonazepam, propanolol, topiramate. Patient wanted to let Dr. Carles Collet know that she has no problems with taking the primidone, but thinks it is not effective alone. Patient stated that she should not have asked Dr. Carles Collet to take her off of meds and should have just left it alone.   I advised patient that I would send her questions and concerns to Dr. Carles Collet and give her a call back once I hear back. Patient verbalized understanding and had no further questions or concerns.

## 2020-11-28 NOTE — Telephone Encounter (Signed)
If shes not having side effects with the primidone (and shes on 2 tablets twice per day - confirm that because she would have JUST gotten up to that as we started that titration upward 6/20 so hasn't been long), she can go to primidone 50 mg, 3 in the AM, 2 at night (correct her med list).  Also, I think it would be a good idea if we get another movement opinion on her.  She doesn't want to do surgery and has tried and failed lots of meds.  If agreeable, refer to [facility] or [facility] movement disorder for Essential tremor.

## 2020-11-29 NOTE — Telephone Encounter (Signed)
She never had well controlled tremor, unfortunately.  She has, though, had better and worse times.  For now, the recommendations are as per previous message and would really like her to consider that opinion, in case they have suggestions I haven't thought of.  2nd opinions don't hurt and can only help!

## 2020-12-01 NOTE — Telephone Encounter (Signed)
Patient is going to hand tight on 2nd referral and continue use of medicaiton at this time. Patient will follow up as scheduled.

## 2020-12-10 ENCOUNTER — Other Ambulatory Visit: Payer: Self-pay | Admitting: Family Medicine

## 2020-12-30 ENCOUNTER — Other Ambulatory Visit: Payer: Self-pay | Admitting: Physician Assistant

## 2020-12-30 DIAGNOSIS — R110 Nausea: Secondary | ICD-10-CM

## 2021-01-08 ENCOUNTER — Other Ambulatory Visit: Payer: Self-pay | Admitting: Neurology

## 2021-01-26 ENCOUNTER — Ambulatory Visit (INDEPENDENT_AMBULATORY_CARE_PROVIDER_SITE_OTHER): Payer: Medicare HMO | Admitting: Family Medicine

## 2021-01-26 ENCOUNTER — Encounter: Payer: Self-pay | Admitting: Family Medicine

## 2021-01-26 VITALS — BP 133/59 | HR 58 | Ht 65.0 in | Wt 144.0 lb

## 2021-01-26 DIAGNOSIS — Z1231 Encounter for screening mammogram for malignant neoplasm of breast: Secondary | ICD-10-CM | POA: Diagnosis not present

## 2021-01-26 DIAGNOSIS — K580 Irritable bowel syndrome with diarrhea: Secondary | ICD-10-CM

## 2021-01-26 DIAGNOSIS — E782 Mixed hyperlipidemia: Secondary | ICD-10-CM

## 2021-01-26 DIAGNOSIS — G250 Essential tremor: Secondary | ICD-10-CM

## 2021-01-26 DIAGNOSIS — R7301 Impaired fasting glucose: Secondary | ICD-10-CM | POA: Diagnosis not present

## 2021-01-26 DIAGNOSIS — E559 Vitamin D deficiency, unspecified: Secondary | ICD-10-CM

## 2021-01-26 LAB — POCT GLYCOSYLATED HEMOGLOBIN (HGB A1C): Hemoglobin A1C: 5.4 % (ref 4.0–5.6)

## 2021-01-26 NOTE — Progress Notes (Signed)
Assessment/Plan:    1.  Essential Tremor  -Patient had emailed to restart the Topamax.  I reminded her of her history, retrying it at least 3 times in the past, each time complaining about hair loss.  Discussed with her that we could try Zonegran, which is very similar to topiramate and see if that medication is effective for her for tremor.  She was agreeable. We will Work up to 200 mg nightly.  Discussed risk, benefits, side effects.  -Continue propranolol, 60 mg twice per day  -She can continue primidone, 50 mg, 3 in the morning, 2 at night.  -Declines surgical options.  I definitely understand that this, but I did tell her that we are not going to get adequate control on tremor without some type of surgical intervention.  -Declined second opinion  Subjective:   Michelle Donovan was seen today in follow up for essential tremor.  My previous records were reviewed prior to todays visit.  Have received several calls/messages since the patient's last visit.  We had suggested that she get another movement disorder opinion, especially since she has tried and failed a lot of medications and has not wanted any surgical interventions.  She has declined that thus far.  She feels that she has hair loss on primidone like the Topamax and it generates EDS.  She also does not feel it is particularly effective.  Having trouble with eating/makeup.  "I never stop shaking except when I sleep."   She states that she wants to go back to her prior regimen.  She feels it worked.  I did review her old records from when we switched.  Records from December, 2021 indicated that patient had wanted to go back on Topamax as well but experienced hair loss again and his tremor had gotten worse and more disabling.  Current prescribed movement disorder medications: Clonazepam 0.5 mg, 1 tab in the morning, half tablet in the evening Primidone, 50 mg, 2 tablets twice per day (states doing 3/2) Propranolol 60 mg twice per  day   Current/Previously tried tremor medications: topamax (tried in 2015/16 with success but had hair loss and stopped; retried again up to 200 mg and had hair loss again and she did not think it was effective and we stopped it; retried again in 2021 at patient request and felt that it was more effective although had hair loss);  Metoprolol - felt drained ; primidone (50 mg - 1/2 at night but got first dose effect and stopped it); meclizine (? Why); on propranolol now - ? Helping now; gabapentin; artane (no help); trihexyphenidyl 1 mg in the morning in the morning (higher dosages with side effects)  ALLERGIES:   Allergies  Allergen Reactions   Tylenol [Acetaminophen] Other (See Comments)    Increased liver enzymes   Codeine Nausea And Vomiting    CURRENT MEDICATIONS:  Outpatient Encounter Medications as of 01/27/2021  Medication Sig   Calcium Carb-Cholecalciferol (CALCIUM 1000 + D PO) Take 1 tablet by mouth daily. Doesn't take it everyday   cetirizine (ZYRTEC) 10 MG tablet Take 10 mg by mouth daily.   clonazePAM (KLONOPIN) 0.5 MG tablet 1 in the AM, 1/2 q hs   ipratropium (ATROVENT) 0.06 % nasal spray Place 2 sprays into both nostrils 2 (two) times daily.   montelukast (SINGULAIR) 10 MG tablet TAKE 1 TABLET BY MOUTH EVERYDAY AT BEDTIME   primidone (MYSOLINE) 50 MG tablet Take 2 tablets (100 mg total) by mouth 2 (two) times  daily.   propranolol (INDERAL) 60 MG tablet TAKE 1 TABLET BY MOUTH TWICE A DAY   valACYclovir (VALTREX) 1000 MG tablet Take 2 tabs every 12 hours x 1 day as needed for cold sores   zonisamide (ZONEGRAN) 100 MG capsule Take 1 capsule (100 mg total) by mouth daily for 14 days, THEN 2 capsules (200 mg total) daily.   No facility-administered encounter medications on file as of 01/27/2021.     Objective:    PHYSICAL EXAMINATION:    VITALS:   Vitals:   01/27/21 1044  BP: 125/62  Pulse: (!) 56  SpO2: 97%  Weight: 144 lb 6.4 oz (65.5 kg)  Height: '5\' 5"'$  (1.651 m)     GEN:  The patient appears stated age and is in NAD. HEENT:  Normocephalic, atraumatic.  The mucous membranes are moist. The superficial temporal arteries are without ropiness or tenderness. CV:  brady.  regular Lungs:  CTAB Neck/HEME:  There are no carotid bruits bilaterally.  Neurological examination:  Orientation: The patient is alert and oriented x3.  She is tearful at times when discussing the impact of tremor. Cranial nerves: There is good facial symmetry. The speech is fluent and clear.  Hearing is intact to conversational tone. Sensation: Sensation is intact to light touch throughout Motor: Strength is at least antigravity x4.  Movement examination: Tone: There is normal tone in the UE/LE Abnormal movements: She has moderate tremor of the outstretched hands on the right and mild to moderate on the left.  She has had tremor "yes" direction.  She has trouble with Archimedes spirals bilaterally. Coordination:  There is no decremation with RAM's, with any form of RAMS, including alternating supination and pronation of the forearm, hand opening and closing, finger taps, heel taps and toe taps. I have reviewed and interpreted the following labs independently   Chemistry      Component Value Date/Time   NA 139 10/23/2020 0000   K 4.5 10/23/2020 0000   CL 104 10/23/2020 0000   CO2 29 10/23/2020 0000   BUN 8 10/23/2020 0000   CREATININE 0.72 10/23/2020 0000      Component Value Date/Time   CALCIUM 9.0 10/23/2020 0000   ALKPHOS 109 12/25/2018 1002   AST 16 10/23/2020 0000   ALT 15 10/23/2020 0000   BILITOT 0.3 10/23/2020 0000      Lab Results  Component Value Date   WBC 7.3 10/23/2020   HGB 12.1 10/23/2020   HCT 37.4 10/23/2020   MCV 86.4 10/23/2020   PLT 209 10/23/2020   Lab Results  Component Value Date   TSH 0.59 12/21/2019     Chemistry      Component Value Date/Time   NA 139 10/23/2020 0000   K 4.5 10/23/2020 0000   CL 104 10/23/2020 0000   CO2 29  10/23/2020 0000   BUN 8 10/23/2020 0000   CREATININE 0.72 10/23/2020 0000      Component Value Date/Time   CALCIUM 9.0 10/23/2020 0000   ALKPHOS 109 12/25/2018 1002   AST 16 10/23/2020 0000   ALT 15 10/23/2020 0000   BILITOT 0.3 10/23/2020 0000         Total time spent on today's visit was 30 minutes, including both face-to-face time and nonface-to-face time.  Time included that spent on review of records (prior notes available to me/labs/imaging if pertinent), discussing treatment and goals, answering patient's questions and coordinating care.  Cc:  Hali Marry, MD

## 2021-01-26 NOTE — Assessment & Plan Note (Signed)
She has not been taking her D lately she says she ran out and just has not refilled it yet.

## 2021-01-26 NOTE — Progress Notes (Signed)
Established Patient Office Visit  Subjective:  Patient ID: Michelle Donovan, female    DOB: 1945/12/24  Age: 75 y.o. MRN: [id number]  CC:  Chief Complaint  Patient presents with   Follow-up   Weight Loss    HPI Michelle Donovan presents for   Impaired fasting glucose-no increased thirst or urination. No symptoms consistent with hypoglycemia.  She says she has been really trying to drink more water in general.  She is also been walking on the track at the [facility] and going regularly.  RUQ pain  -CT abdomen pelvis on June 7th just showed fatty infiltration of the liver and some atherosclerosis.  She also has a history of IBS and says that she is having difficulty getting normal stools she either has watery stools and then goes to being constipated.  Wonders if a probiotic would be helpful.  She really does not want to do anything prescription if she can help it.  Essential tremor-working with Dr. Carles Collet.  More recently she is on a higher dose of primidone and feels like it is really just not working well for her.  She does not want to do any major intervention such as surgery she says she really it scares her she has heard of some bad things that have happened because of the surgery.    Past Medical History:  Diagnosis Date   Allergic rhinitis, cause unspecified    Asthma    not bad per pt   Dermatophytosis of scalp and beard    Diverticulosis    Essential and other specified forms of tremor    Fatty liver    Fatty liver disease, nonalcoholic 0000000   GERD (gastroesophageal reflux disease)    HTN (hypertension)    history of   Irritable bowel syndrome    Lumbago    bulging disk per MRI    Mixed hyperlipidemia    Other acute reactions to stress    Other diseases of lung, not elsewhere classified    solitary pulm. nodule(left)   PONV (postoperative nausea and vomiting)    Thyroid nodule    Unspecified asthma(493.90)    Unspecified vitamin D deficiency     Past Surgical  History:  Procedure Laterality Date   ABDOMINAL HYSTERECTOMY     BUNIONECTOMY Bilateral 08/2019   CHOLECYSTECTOMY N/A 08/23/2016   Procedure: LAPAROSCOPIC CHOLECYSTECTOMY;  Surgeon: Clovis Riley, MD;  Location: [facility];  Service: General;  Laterality: N/A;   COLONOSCOPY     COLONOSCOPY WITH PROPOFOL N/A 01/26/2019   Procedure: COLONOSCOPY WITH PROPOFOL;  Surgeon: Jonathon Bellows, MD;  Location: [facility] ENDOSCOPY;  Service: Gastroenterology;  Laterality: N/A;   ESOPHAGOGASTRODUODENOSCOPY (EGD) WITH PROPOFOL N/A 03/09/2017   Procedure: ESOPHAGOGASTRODUODENOSCOPY (EGD) WITH PROPOFOL;  Surgeon: Jonathon Bellows, MD;  Location: [facility] ENDOSCOPY;  Service: Gastroenterology;  Laterality: N/A;   LAPAROSCOPIC TOTAL HYSTERECTOMY  2002   LAPAROSCOPIC UNILATERAL SALPINGO OOPHERECTOMY     TONSILLECTOMY  1967/68   TUBAL LIGATION  1986    Family History  Problem Relation Age of Onset   Stroke Mother    Tremor Mother    Diabetes Mother    Heart failure Mother    Other Father        Trigeminal neuralgia   Diabetes Sister    Coronary artery disease Sister    Irritable bowel syndrome Sister    Tremor Sister    Diabetes Brother        x 3   Coronary artery disease Brother  x 3   Irritable bowel syndrome Brother        x 3   Tremor Brother        x 3   Breast cancer Other        Aunts and cousin   Colon cancer Maternal Grandfather     Social History   Socioeconomic History   Marital status: Divorced    Spouse name: Not on file   Number of children: 1   Years of education: 12th grade   Highest education level: High school graduate  Occupational History   Occupation: retired    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   Occupation: retired    Fish farm manager: [place]  Tobacco Use   Smoking status: Former    Types: Cigarettes    Quit date: 05/17/1980    Years since quitting: 40.7   Smokeless tobacco: Never  Vaping Use   Vaping Use: Never used  Substance and  Sexual Activity   Alcohol use: No   Drug use: No   Sexual activity: Never  Other Topics Concern   Not on file  Social History Narrative   Divorced      1 daughter-healthy      No regular exercise      Right handed      Social Determinants of Health   Financial Resource Strain: Low Risk    Difficulty of Paying Living Expenses: Not hard at all  Food Insecurity: Food Insecurity Present   Worried About Charity fundraiser in the Last Year: Sometimes true   [place] in the Last Year: Sometimes true  Transportation Needs: No Transportation Needs   Lack of Transportation (Medical): No   Lack of Transportation (Non-Medical): No  Physical Activity: Inactive   Days of Exercise per Week: 0 days   Minutes of Exercise per Session: 0 min  Stress: No Stress Concern Present   Feeling of Stress : Not at all  Social Connections: Socially Isolated   Frequency of Communication with Friends and Family: Twice a week   Frequency of Social Gatherings with Friends and Family: Never   Attends Religious Services: Never   Marine scientist or Organizations: No   Attends Music therapist: Never   Marital Status: Divorced  Human resources officer Violence: Not At Risk   Fear of Current or Ex-Partner: No   Emotionally Abused: No   Physically Abused: No   Sexually Abused: No    Outpatient Medications Prior to Visit  Medication Sig Dispense Refill   Calcium Carb-Cholecalciferol (CALCIUM 1000 + D PO) Take 1 tablet by mouth daily. Doesn't take it everyday     cetirizine (ZYRTEC) 10 MG tablet Take 10 mg by mouth daily.     clonazePAM (KLONOPIN) 0.5 MG tablet 1 in the AM, 1/2 q hs 45 tablet 3   ipratropium (ATROVENT) 0.06 % nasal spray Place 2 sprays into both nostrils 2 (two) times daily.     montelukast (SINGULAIR) 10 MG tablet TAKE 1 TABLET BY MOUTH EVERYDAY AT BEDTIME 90 tablet 3   primidone (MYSOLINE) 50 MG tablet Take 2 tablets (100 mg total) by mouth 2 (two) times daily. 360  tablet 0   propranolol (INDERAL) 60 MG tablet TAKE 1 TABLET BY MOUTH TWICE A DAY 180 tablet 0   valACYclovir (VALTREX) 1000 MG tablet Take 2 tabs every 12 hours x 1 day as needed for cold sores 20 tablet  1   No facility-administered medications prior to visit.    Allergies  Allergen Reactions   Tylenol [Acetaminophen] Other (See Comments)    Increased liver enzymes   Codeine Nausea And Vomiting    ROS Review of Systems    Objective:    Physical Exam Constitutional:      Appearance: Normal appearance. She is well-developed.  HENT:     Head: Normocephalic and atraumatic.  Cardiovascular:     Rate and Rhythm: Normal rate and regular rhythm.     Heart sounds: Normal heart sounds.  Pulmonary:     Effort: Pulmonary effort is normal.     Breath sounds: Normal breath sounds.  Skin:    General: Skin is warm and dry.  Neurological:     Mental Status: She is alert and oriented to person, place, and time.  Psychiatric:        Behavior: Behavior normal.    BP (!) 133/59   Pulse (!) 58   Ht '5\' 5"'$  (1.651 m)   Wt 144 lb (65.3 kg)   SpO2 98%   BMI 23.96 kg/m  Wt Readings from Last 3 Encounters:  01/26/21 144 lb (65.3 kg)  10/23/20 142 lb (64.4 kg)  10/20/20 147 lb (66.7 kg)     There are no preventive care reminders to display for this patient.   There are no preventive care reminders to display for this patient.  Lab Results  Component Value Date   TSH 0.59 12/21/2019   Lab Results  Component Value Date   WBC 7.3 10/23/2020   HGB 12.1 10/23/2020   HCT 37.4 10/23/2020   MCV 86.4 10/23/2020   PLT 209 10/23/2020   Lab Results  Component Value Date   NA 139 10/23/2020   K 4.5 10/23/2020   CO2 29 10/23/2020   GLUCOSE 93 10/23/2020   BUN 8 10/23/2020   CREATININE 0.72 10/23/2020   BILITOT 0.3 10/23/2020   ALKPHOS 109 12/25/2018   AST 16 10/23/2020   ALT 15 10/23/2020   PROT 6.6 10/23/2020   PROT 6.8 10/23/2020   ALBUMIN 3.8 12/25/2018   CALCIUM 9.0  10/23/2020   ANIONGAP 9 05/13/2018   GFR 78.59 12/25/2018   Lab Results  Component Value Date   CHOL 223 (H) 12/21/2019   Lab Results  Component Value Date   HDL 36 (L) 12/21/2019   Lab Results  Component Value Date   LDLCALC 158 (H) 12/21/2019   Lab Results  Component Value Date   TRIG 156 (H) 12/21/2019   Lab Results  Component Value Date   CHOLHDL 6.2 (H) 12/21/2019   Lab Results  Component Value Date   HGBA1C 5.4 01/26/2021      Assessment & Plan:   Problem List Items Addressed This Visit       Digestive   IRRITABLE BOWEL SYNDROME    Discussed options. Recommend starting daily Fiber. Benefit makes a product that also has a probiotic.  This can help with consistency.  We also discussed adding a trial of IBgard if needed to see if this helps as well.  Also avoiding trigger foods.  There are some prescription options as well that we can discuss.  But she is not interested in that at this time.        Endocrine   IFG (impaired fasting glucose) - Primary    A1C looks phenomenal today at 5.4 she is done a great job in bringing that down.  Keep up the great work.  Relevant Orders   POCT glycosylated hemoglobin (Hb A1C) (Completed)   Lipid Panel w/reflex Direct LDL   COMPLETE METABOLIC PANEL WITH GFR   CBC     Nervous and Auditory   Essential tremor    Has a follow-up with Dr. Carles Collet later this week.  She is just frustrated that her tremor has gotten worse and she feels like the primidone is not really helping.  She has been experiencing some new onset hair loss but read online that it could be from having had COVID.  We did discuss that this is actually fairly common and usually is temporary that hair loss can be quite pronounced but then usually starts to grow back within 3 to 4 months.        Other   Vitamin D deficiency    She has not been taking her D lately she says she ran out and just has not refilled it yet.      Relevant Orders   VITAMIN D 25  Hydroxy (Vit-D Deficiency, Fractures)   HYPERLIPIDEMIA    Due to recheck lipids.      Other Visit Diagnoses     Screening mammogram, encounter for       Relevant Orders   MM 3D SCREEN BREAST BILATERAL       No orders of the defined types were placed in this encounter.   Follow-up: No follow-ups on file.    Beatrice Lecher, MD

## 2021-01-26 NOTE — Assessment & Plan Note (Signed)
Has a follow-up with Dr. Carles Collet later this week.  She is just frustrated that her tremor has gotten worse and she feels like the primidone is not really helping.  She has been experiencing some new onset hair loss but read online that it could be from having had COVID.  We did discuss that this is actually fairly common and usually is temporary that hair loss can be quite pronounced but then usually starts to grow back within 3 to 4 months.

## 2021-01-26 NOTE — Assessment & Plan Note (Signed)
Due to recheck lipids. 

## 2021-01-26 NOTE — Assessment & Plan Note (Addendum)
Discussed options. Recommend starting daily Fiber. Benefit makes a product that also has a probiotic.  This can help with consistency.  We also discussed adding a trial of IBgard if needed to see if this helps as well.  Also avoiding trigger foods.  There are some prescription options as well that we can discuss.  But she is not interested in that at this time.

## 2021-01-26 NOTE — Assessment & Plan Note (Signed)
A1C looks phenomenal today at 5.4 she is done a great job in bringing that down.  Keep up the great work.

## 2021-01-27 ENCOUNTER — Encounter: Payer: Self-pay | Admitting: Neurology

## 2021-01-27 ENCOUNTER — Ambulatory Visit: Payer: Medicare HMO | Admitting: Neurology

## 2021-01-27 ENCOUNTER — Other Ambulatory Visit: Payer: Self-pay

## 2021-01-27 VITALS — BP 125/62 | HR 56 | Ht 65.0 in | Wt 144.4 lb

## 2021-01-27 DIAGNOSIS — G250 Essential tremor: Secondary | ICD-10-CM

## 2021-01-27 MED ORDER — PRIMIDONE 50 MG PO TABS: ORAL_TABLET | ORAL | 0 refills | Status: DC

## 2021-01-27 MED ORDER — ZONISAMIDE 100 MG PO CAPS: ORAL_CAPSULE | ORAL | 1 refills | Status: DC

## 2021-01-27 NOTE — Patient Instructions (Signed)
Start zonegran 100 mg nightly at bed x 2 weeks and then increased to 2 capsules at bed thereafter.  The physicians and staff at [facility] are committed to providing excellent care. You may receive a survey requesting feedback about your experience at our office. We strive to receive "very good" responses to the survey questions. If you feel that your experience would prevent you from giving the office a "very good " response, please contact our office to try to remedy the situation. We may be reached at [phone]. Thank you for taking the time out of your busy day to complete the survey.

## 2021-01-29 DIAGNOSIS — R7301 Impaired fasting glucose: Secondary | ICD-10-CM | POA: Diagnosis not present

## 2021-01-29 DIAGNOSIS — E781 Pure hyperglyceridemia: Secondary | ICD-10-CM | POA: Diagnosis not present

## 2021-01-29 DIAGNOSIS — E559 Vitamin D deficiency, unspecified: Secondary | ICD-10-CM | POA: Diagnosis not present

## 2021-01-29 LAB — LIPID PANEL W/REFLEX DIRECT LDL
Cholesterol: 247 mg/dL — ABNORMAL HIGH (ref <200)
HDL: 34 mg/dL — ABNORMAL LOW (ref >=50)
LDL Cholesterol (Calc): 177 mg/dL (calc) — ABNORMAL HIGH
Non-HDL Cholesterol (Calc): 213 mg/dL (calc) — ABNORMAL HIGH (ref <130)
Total CHOL/HDL Ratio: 7.3 (calc) — ABNORMAL HIGH (ref <5.0)
Triglycerides: 200 mg/dL — ABNORMAL HIGH (ref <150)

## 2021-01-29 LAB — VITAMIN D 25 HYDROXY (VIT D DEFICIENCY, FRACTURES): Vit D, 25-Hydroxy: 22 ng/mL — ABNORMAL LOW (ref 30–100)

## 2021-01-29 LAB — CBC
HCT: 39.4 % (ref 35.0–45.0)
Hemoglobin: 12.9 g/dL (ref 11.7–15.5)
MCH: 29.5 pg (ref 27.0–33.0)
MCHC: 32.7 g/dL (ref 32.0–36.0)
MCV: 90 fL (ref 80.0–100.0)
MPV: 10.3 fL (ref 7.5–12.5)
Platelets: 213 10*3/uL (ref 140–400)
RBC: 4.38 10*6/uL (ref 3.80–5.10)
RDW: 13.1 % (ref 11.0–15.0)
WBC: 7.2 10*3/uL (ref 3.8–10.8)

## 2021-01-29 LAB — COMPLETE METABOLIC PANEL WITH GFR
AG Ratio: 1.6 (calc) (ref 1.0–2.5)
ALT: 17 U/L (ref 6–29)
AST: 17 U/L (ref 10–35)
Albumin: 4 g/dL (ref 3.6–5.1)
Alkaline phosphatase (APISO): 104 U/L (ref 37–153)
BUN: 9 mg/dL (ref 7–25)
CO2: 27 mmol/L (ref 20–32)
Calcium: 8.9 mg/dL (ref 8.6–10.4)
Chloride: 107 mmol/L (ref 98–110)
Creat: 0.69 mg/dL (ref 0.60–1.00)
Globulin: 2.5 g/dL (calc) (ref 1.9–3.7)
Glucose, Bld: 99 mg/dL (ref 65–99)
Potassium: 4.4 mmol/L (ref 3.5–5.3)
Sodium: 141 mmol/L (ref 135–146)
Total Bilirubin: 0.3 mg/dL (ref 0.2–1.2)
Total Protein: 6.5 g/dL (ref 6.1–8.1)
eGFR: 92 mL/min/{1.73_m2} (ref >=60)

## 2021-01-30 ENCOUNTER — Encounter: Payer: Self-pay | Admitting: Family Medicine

## 2021-01-30 NOTE — Progress Notes (Signed)
Hi Ohara, Your cholesterol is up compared to last year.  Based on your numbers and risk factors you have an almost 20% risk of having a heart attack or stroke in the next 10 years.  I would highly recommend a statin taken at bedtime to significantly reduce your risk for complications.  It can also improve fatty liver.  If you are ok with  the medication please me know and I can send in a prescription and then we can recheck your labs in 3 months.  Your metabolic panel and blood count are normal.  Your vitamin D is low I recommend 25 mcg daily over-the-counter if you are already taking vitamin D then I would recommend doubling your dose and then we can recheck your level in 3 months again  The 10-year ASCVD risk score (Arnett DK, et al., 2019) is: 17.5%   Values used to calculate the score:     Age: 75 years     Sex: Female     Is Non-Hispanic African American: No     Diabetic: No     Tobacco smoker: No     Systolic Blood Pressure: 0000000 mmHg     Is BP treated: Yes     HDL Cholesterol: 34 mg/dL     Total Cholesterol: 247 mg/dL

## 2021-02-02 MED ORDER — ROSUVASTATIN CALCIUM 5 MG PO TABS: 5.0000 mg | ORAL_TABLET | Freq: Every day | ORAL | 3 refills | Status: DC

## 2021-02-02 NOTE — Telephone Encounter (Signed)
Meds ordered this encounter  Medications   rosuvastatin (CRESTOR) 5 MG tablet    Sig: Take 1 tablet (5 mg total) by mouth daily.    Dispense:  90 tablet    Refill:  3

## 2021-02-19 ENCOUNTER — Other Ambulatory Visit: Payer: Self-pay

## 2021-02-19 ENCOUNTER — Ambulatory Visit (INDEPENDENT_AMBULATORY_CARE_PROVIDER_SITE_OTHER): Payer: Medicare HMO

## 2021-02-19 DIAGNOSIS — Z1231 Encounter for screening mammogram for malignant neoplasm of breast: Secondary | ICD-10-CM

## 2021-02-20 ENCOUNTER — Other Ambulatory Visit: Payer: Self-pay | Admitting: Neurology

## 2021-02-20 ENCOUNTER — Other Ambulatory Visit: Payer: Self-pay

## 2021-02-20 DIAGNOSIS — G250 Essential tremor: Secondary | ICD-10-CM

## 2021-02-24 NOTE — Progress Notes (Signed)
Please call patient. Normal mammogram.  Repeat in 1 year.  

## 2021-03-13 ENCOUNTER — Other Ambulatory Visit: Payer: Self-pay | Admitting: Family Medicine

## 2021-05-28 DIAGNOSIS — L300 Nummular dermatitis: Secondary | ICD-10-CM | POA: Diagnosis not present

## 2021-05-28 DIAGNOSIS — L299 Pruritus, unspecified: Secondary | ICD-10-CM | POA: Diagnosis not present

## 2021-05-28 DIAGNOSIS — L820 Inflamed seborrheic keratosis: Secondary | ICD-10-CM | POA: Diagnosis not present

## 2021-06-13 ENCOUNTER — Other Ambulatory Visit: Payer: Self-pay | Admitting: Family Medicine

## 2021-06-25 DIAGNOSIS — L820 Inflamed seborrheic keratosis: Secondary | ICD-10-CM | POA: Diagnosis not present

## 2021-06-25 DIAGNOSIS — L819 Disorder of pigmentation, unspecified: Secondary | ICD-10-CM | POA: Diagnosis not present

## 2021-06-25 DIAGNOSIS — L300 Nummular dermatitis: Secondary | ICD-10-CM | POA: Diagnosis not present

## 2021-07-01 ENCOUNTER — Other Ambulatory Visit: Payer: Self-pay | Admitting: Neurology

## 2021-07-01 DIAGNOSIS — G250 Essential tremor: Secondary | ICD-10-CM

## 2021-07-20 ENCOUNTER — Other Ambulatory Visit: Payer: Self-pay | Admitting: Neurology

## 2021-07-20 ENCOUNTER — Encounter: Payer: Self-pay | Admitting: Neurology

## 2021-07-20 DIAGNOSIS — G250 Essential tremor: Secondary | ICD-10-CM

## 2021-07-22 ENCOUNTER — Other Ambulatory Visit: Payer: Self-pay | Admitting: Neurology

## 2021-07-25 ENCOUNTER — Other Ambulatory Visit: Payer: Self-pay | Admitting: Neurology

## 2021-07-25 DIAGNOSIS — G250 Essential tremor: Secondary | ICD-10-CM

## 2021-07-27 ENCOUNTER — Ambulatory Visit (INDEPENDENT_AMBULATORY_CARE_PROVIDER_SITE_OTHER): Payer: No Typology Code available for payment source | Admitting: Family Medicine

## 2021-07-27 ENCOUNTER — Other Ambulatory Visit: Payer: Self-pay

## 2021-07-27 ENCOUNTER — Encounter: Payer: Self-pay | Admitting: Family Medicine

## 2021-07-27 VITALS — BP 117/43 | HR 56 | Ht 65.0 in | Wt 138.0 lb

## 2021-07-27 DIAGNOSIS — I10 Essential (primary) hypertension: Secondary | ICD-10-CM | POA: Diagnosis not present

## 2021-07-27 DIAGNOSIS — R7301 Impaired fasting glucose: Secondary | ICD-10-CM | POA: Diagnosis not present

## 2021-07-27 DIAGNOSIS — R8290 Unspecified abnormal findings in urine: Secondary | ICD-10-CM

## 2021-07-27 DIAGNOSIS — R21 Rash and other nonspecific skin eruption: Secondary | ICD-10-CM

## 2021-07-27 DIAGNOSIS — G250 Essential tremor: Secondary | ICD-10-CM | POA: Diagnosis not present

## 2021-07-27 LAB — POCT URINALYSIS DIP (CLINITEK)
Bilirubin, UA: NEGATIVE
Blood, UA: NEGATIVE
Glucose, UA: NEGATIVE mg/dL
Ketones, POC UA: NEGATIVE mg/dL
Leukocytes, UA: NEGATIVE
Nitrite, UA: NEGATIVE
POC PROTEIN,UA: NEGATIVE
Spec Grav, UA: 1.02 (ref 1.010–1.025)
Urobilinogen, UA: 0.2 E.U./dL
pH, UA: 5.5 (ref 5.0–8.0)

## 2021-07-27 LAB — POCT GLYCOSYLATED HEMOGLOBIN (HGB A1C): Hemoglobin A1C: 5.4 % (ref 4.0–5.6)

## 2021-07-27 NOTE — Assessment & Plan Note (Signed)
Well controlled. Continue current regimen. Follow up in  6 mo  

## 2021-07-27 NOTE — Progress Notes (Signed)
Established Patient Office Visit  Subjective:  Patient ID: Michelle Donovan, female    DOB: 08-Sep-1945  Age: 76 y.o. MRN: [id number]  CC:  Chief Complaint  Patient presents with   ifg    HPI Michelle Donovan presents for   Impaired fasting glucose-no increased thirst or urination. No symptoms consistent with hypoglycemia.  She has a lesion on scalp.  Noticed after last hair cut.  No itching or scaling. No lesions.  She denies any new soaps detergents etc.  Fishy odor to urine x 4 weeks. No dysuria and no fever or chills.  Has been taking AZO.    He also noted that bradycardia was listed on her chart in a couple places and did have some questions about that.  Past Medical History:  Diagnosis Date   Allergic rhinitis, cause unspecified    Asthma    not bad per pt   Dermatophytosis of scalp and beard    Diverticulosis    Essential and other specified forms of tremor    Fatty liver    Fatty liver disease, nonalcoholic 1914   GERD (gastroesophageal reflux disease)    HTN (hypertension)    history of   Irritable bowel syndrome    Lumbago    bulging disk per MRI    Mixed hyperlipidemia    Other acute reactions to stress    Other diseases of lung, not elsewhere classified    solitary pulm. nodule(left)   PONV (postoperative nausea and vomiting)    Thyroid nodule    Unspecified asthma(493.90)    Unspecified vitamin D deficiency     Past Surgical History:  Procedure Laterality Date   ABDOMINAL HYSTERECTOMY     BUNIONECTOMY Bilateral 08/2019   CHOLECYSTECTOMY N/A 08/23/2016   Procedure: LAPAROSCOPIC CHOLECYSTECTOMY;  Surgeon: Clovis Riley, MD;  Location: [facility];  Service: General;  Laterality: N/A;   COLONOSCOPY     COLONOSCOPY WITH PROPOFOL N/A 01/26/2019   Procedure: COLONOSCOPY WITH PROPOFOL;  Surgeon: Jonathon Bellows, MD;  Location: [facility] ENDOSCOPY;  Service: Gastroenterology;  Laterality: N/A;   ESOPHAGOGASTRODUODENOSCOPY (EGD) WITH PROPOFOL N/A 03/09/2017    Procedure: ESOPHAGOGASTRODUODENOSCOPY (EGD) WITH PROPOFOL;  Surgeon: Jonathon Bellows, MD;  Location: [facility] ENDOSCOPY;  Service: Gastroenterology;  Laterality: N/A;   LAPAROSCOPIC TOTAL HYSTERECTOMY  2002   LAPAROSCOPIC UNILATERAL SALPINGO OOPHERECTOMY     TONSILLECTOMY  1967/68   TUBAL LIGATION  1986    Family History  Problem Relation Age of Onset   Stroke Mother    Tremor Mother    Diabetes Mother    Heart failure Mother    Other Father        Trigeminal neuralgia   Diabetes Sister    Coronary artery disease Sister    Irritable bowel syndrome Sister    Tremor Sister    Diabetes Brother        x 3   Coronary artery disease Brother        x 3   Irritable bowel syndrome Brother        x 3   Tremor Brother        x 3   Breast cancer Other        Aunts and cousin   Colon cancer Maternal Grandfather     Social History   Socioeconomic History   Marital status: Divorced    Spouse name: Not on file   Number of children: 1   Years of education: 12th grade   Highest education level:  High school graduate  Occupational History   Occupation: retired    Fish farm manager: OTHER    Comment: [employer]: RETIRED   Occupation: retired    Fish farm manager: [employer]  Tobacco Use   Smoking status: Former    Types: Cigarettes    Quit date: 05/17/1980    Years since quitting: 41.2   Smokeless tobacco: Never  Vaping Use   Vaping Use: Never used  Substance and Sexual Activity   Alcohol use: No   Drug use: No   Sexual activity: Never  Other Topics Concern   Not on file  Social History Narrative   Divorced      1 daughter-healthy      No regular exercise      Right handed      Social Determinants of Health   Financial Resource Strain: Not on file  Food Insecurity: Not on file  Transportation Needs: Not on file  Physical Activity: Not on file  Stress: Not on file  Social Connections: Not on file  Intimate Partner Violence: Not on file    Outpatient Medications  Prior to Visit  Medication Sig Dispense Refill   Calcium Carb-Cholecalciferol (CALCIUM 1000 + D PO) Take 1 tablet by mouth daily. Doesn't take it everyday     cetirizine (ZYRTEC) 10 MG tablet Take 10 mg by mouth daily.     clonazePAM (KLONOPIN) 0.5 MG tablet TAKE 1 TABLET BY MOUTH EVERY MORNING AND 1/2TAB AT BEDTIME 45 tablet 0   ipratropium (ATROVENT) 0.06 % nasal spray Place 2 sprays into both nostrils 2 (two) times daily.     montelukast (SINGULAIR) 10 MG tablet TAKE 1 TABLET BY MOUTH EVERYDAY AT BEDTIME 90 tablet 3   primidone (MYSOLINE) 50 MG tablet TAKE 3 IN THE MORNING AND 2 AT NIGHT. 450 tablet 0   propranolol (INDERAL) 60 MG tablet TAKE 1 TABLET BY MOUTH TWICE A DAY 180 tablet 0   rosuvastatin (CRESTOR) 5 MG tablet Take 1 tablet (5 mg total) by mouth daily. 90 tablet 3   valACYclovir (VALTREX) 1000 MG tablet Take 2 tabs every 12 hours x 1 day as needed for cold sores 20 tablet 1   zonisamide (ZONEGRAN) 100 MG capsule Take 2 capsules daily 180 capsule 0   No facility-administered medications prior to visit.    Allergies  Allergen Reactions   Tylenol [Acetaminophen] Other (See Comments)    Increased liver enzymes   Codeine Nausea And Vomiting    ROS Review of Systems    Objective:    Physical Exam Constitutional:      Appearance: Normal appearance. She is well-developed.  HENT:     Head: Normocephalic and atraumatic.  Cardiovascular:     Rate and Rhythm: Normal rate and regular rhythm.     Heart sounds: Normal heart sounds.  Pulmonary:     Effort: Pulmonary effort is normal.     Breath sounds: Normal breath sounds.  Skin:    General: Skin is warm and dry.     Comments: Mildly erythematous rash at the base of the scalp no thickened scale or irritation.  No scabs no excoriations.  Does up into the hairline a couple of inches.  Neurological:     Mental Status: She is alert and oriented to person, place, and time.  Psychiatric:        Behavior: Behavior normal.     BP (!) 117/43    Pulse (!) 56    Ht 5'  5" (1.651 m)    Wt 138 lb (62.6 kg)    SpO2 98%    BMI 22.96 kg/m  Wt Readings from Last 3 Encounters:  07/27/21 138 lb (62.6 kg)  01/27/21 144 lb 6.4 oz (65.5 kg)  01/26/21 144 lb (65.3 kg)     Health Maintenance Due  Topic Date Due   Pneumonia Vaccine 59+ Years old (1 - PCV) Never done    There are no preventive care reminders to display for this patient.  Lab Results  Component Value Date   TSH 0.59 12/21/2019   Lab Results  Component Value Date   WBC 7.2 01/29/2021   HGB 12.9 01/29/2021   HCT 39.4 01/29/2021   MCV 90.0 01/29/2021   PLT 213 01/29/2021   Lab Results  Component Value Date   NA 141 01/29/2021   K 4.4 01/29/2021   CO2 27 01/29/2021   GLUCOSE 99 01/29/2021   BUN 9 01/29/2021   CREATININE 0.69 01/29/2021   BILITOT 0.3 01/29/2021   ALKPHOS 109 12/25/2018   AST 17 01/29/2021   ALT 17 01/29/2021   PROT 6.5 01/29/2021   ALBUMIN 3.8 12/25/2018   CALCIUM 8.9 01/29/2021   ANIONGAP 9 05/13/2018   EGFR 92 01/29/2021   GFR 78.59 12/25/2018   Lab Results  Component Value Date   CHOL 247 (H) 01/29/2021   Lab Results  Component Value Date   HDL 34 (L) 01/29/2021   Lab Results  Component Value Date   LDLCALC 177 (H) 01/29/2021   Lab Results  Component Value Date   TRIG 200 (H) 01/29/2021   Lab Results  Component Value Date   CHOLHDL 7.3 (H) 01/29/2021   Lab Results  Component Value Date   HGBA1C 5.4 07/27/2021      Assessment & Plan:   Problem List Items Addressed This Visit       Cardiovascular and Mediastinum   Benign essential hypertension    Well controlled. Continue current regimen. Follow up in  6 mo         Endocrine   IFG (impaired fasting glucose) - Primary    1C looks phenomenal today.  Follow-up again in 6 to 12 months.      Relevant Orders   POCT glycosylated hemoglobin (Hb A1C) (Completed)     Nervous and Auditory   Essential tremor    Always with Dr. Wells Guiles Tat.   She is very concerned about her tremor and frustrated by it.      Other Visit Diagnoses     Rash       Bad odor of urine       Relevant Orders   POCT URINALYSIS DIP (CLINITEK) (Completed)       Bradycardia-did discuss technically a heart rate less than 60 does qualify for bradycardia that we do not typically get concerned unless it is dropping into the 40s and she is on a beta-blocker which could certainly cause bradycardia.  Bad odor of urine-urinalysis was normal.  Very reassuring no worrisome findings.  I did mention potentially swabbing for BV.  She declined.  Did discuss that sometimes things like supplements can also affect the odor of the urine.  No orders of the defined types were placed in this encounter.   Follow-up: Return in about 6 months (around 01/27/2022) for glucose.    Beatrice Lecher, MD

## 2021-07-27 NOTE — Assessment & Plan Note (Signed)
1C looks phenomenal today.  Follow-up again in 6 to 12 months. ?

## 2021-07-27 NOTE — Assessment & Plan Note (Signed)
Always with Dr. Wells Guiles Tat.  She is very concerned about her tremor and frustrated by it. ?

## 2021-08-03 NOTE — Progress Notes (Signed)
? ? ?Assessment/Plan:  ? ? ?1.  Essential Tremor ? -Long discussion with the patient today.  I know she wants to go back on the Topamax, but I told her that we have done that 3 other times and each time she requested to discontinue it because of side effects.  I just was not willing to do that again.  In addition, I just do not think that we will be particularly impactful for her degree of tremor.  Finally, I think it potentially can cause more side effects than good as she gets a little bit older, especially in combination with the other medications that she is on. ? -For now she will continue the Zonegran, 100 mg, 2 capsules at night  ? -Continue propranolol, 60 mg twice per day ? -She can continue primidone, 50 mg, 3 in the morning, 2 at night. ? -As with multiple other visits, I discussed with patient that we really are not going to get adequate tremor control without some type of surgical intervention, either focused ultrasound or DBS.  She is not interested in those interventions, which I understand, but want her to understand that tremor is not going to be adequately controlled without that.   ? -Declined second opinion, but I asked her to really think about that.  I told her that someone else may have another idea that I have not thought about. ? -Discussed with her Botox for essential tremor.  It is rarely paid for by insurance, but discussed with her that Dr. Letta Pate may be willing to do that if she is willing to go for the consult (if insurance would pay).  She admits she is under a lot of stress right now as her sister is getting ready to have surgery for a mass in her abdomen, so she wants to wait until that surgery is over.  I will wait to send the referral until after April 4.  Patient was agreeable. ? ?Subjective:  ? ?Michelle Donovan was seen today in follow up for essential tremor.  My previous records were reviewed prior to todays visit.  Last visit, we started Zonegran, in addition to the  multiple other medications that she was on for tremor.  She reports that tremor is bad.  She is very frustrated.  She has followed up with her primary care.  Last visit March 13.  Note that she was complaining about tremor in that visit. ? ?Current prescribed movement disorder medications: ?Clonazepam 0.5 mg, 1 tab in the morning, half tablet in the evening ?Primidone, 50 mg, 2 tablets twice per day (states doing 3/2) ?Propranolol 60 mg twice per day ?Zonegran, 200 mg nightly (started last visit) ? ?Current/Previously tried tremor medications: topamax (tried in 2015/16 with success but had hair loss and stopped; retried again up to 200 mg and had hair loss again and she did not think it was effective and we stopped it; retried again in 2021 at patient request and felt that it was more effective although had hair loss);  Metoprolol - felt drained ; primidone (50 mg - 1/2 at night but got first dose effect and stopped it); meclizine (? Why); on propranolol now - ? Helping now; gabapentin; artane (no help); trihexyphenidyl 1 mg in the morning in the morning (higher dosages with side effects) ? ?ALLERGIES:   ?Allergies  ?Allergen Reactions  ? Tylenol [Acetaminophen] Other (See Comments)  ?  Increased liver enzymes  ? Codeine Nausea And Vomiting  ? ? ?CURRENT MEDICATIONS:  ?  Outpatient Encounter Medications as of 08/04/2021  ?Medication Sig  ? Calcium Carb-Cholecalciferol (CALCIUM 1000 + D PO) Take 1 tablet by mouth daily. Doesn't take it everyday  ? cetirizine (ZYRTEC) 10 MG tablet Take 10 mg by mouth daily.  ? clonazePAM (KLONOPIN) 0.5 MG tablet TAKE 1 TABLET BY MOUTH EVERY MORNING AND 1/2TAB AT BEDTIME  ? ipratropium (ATROVENT) 0.06 % nasal spray Place 2 sprays into both nostrils 2 (two) times daily.  ? montelukast (SINGULAIR) 10 MG tablet TAKE 1 TABLET BY MOUTH EVERYDAY AT BEDTIME  ? Multiple Vitamin (MULTIVITAMIN) tablet Take 1 tablet by mouth daily.  ? primidone (MYSOLINE) 50 MG tablet TAKE 3 IN THE MORNING AND 2 AT  NIGHT.  ? propranolol (INDERAL) 60 MG tablet TAKE 1 TABLET BY MOUTH TWICE A DAY  ? rosuvastatin (CRESTOR) 5 MG tablet Take 1 tablet (5 mg total) by mouth daily.  ? valACYclovir (VALTREX) 1000 MG tablet Take 2 tabs every 12 hours x 1 day as needed for cold sores  ? zonisamide (ZONEGRAN) 100 MG capsule Take 2 capsules daily  ? ?No facility-administered encounter medications on file as of 08/04/2021.  ? ? ? ?Objective:  ? ? ?PHYSICAL EXAMINATION:   ? ?VITALS:   ?Vitals:  ? 08/04/21 1418  ?BP: 119/79  ?Pulse: 65  ?SpO2: 97%  ?Weight: 139 lb 12.8 oz (63.4 kg)  ?Height: '5\' 5"'$  (1.651 m)  ? ? ? ?GEN:  The patient appears stated age and is in NAD. ? ? ?Neurological examination: ? ?Orientation: The patient is alert and oriented x3.  She is tearful at times when discussing the impact of tremor. ?100% of the visit time was spent in counseling. ? ?  Chemistry   ?   ?Component Value Date/Time  ? NA 141 01/29/2021 0858  ? K 4.4 01/29/2021 0858  ? CL 107 01/29/2021 0858  ? CO2 27 01/29/2021 0858  ? BUN 9 01/29/2021 0858  ? CREATININE 0.69 01/29/2021 0858  ?    ?Component Value Date/Time  ? CALCIUM 8.9 01/29/2021 0858  ? ALKPHOS 109 12/25/2018 1002  ? AST 17 01/29/2021 0858  ? ALT 17 01/29/2021 0858  ? BILITOT 0.3 01/29/2021 0858  ?  ? ? ?Lab Results  ?Component Value Date  ? WBC 7.2 01/29/2021  ? HGB 12.9 01/29/2021  ? HCT 39.4 01/29/2021  ? MCV 90.0 01/29/2021  ? PLT 213 01/29/2021  ? ?Lab Results  ?Component Value Date  ? TSH 0.59 12/21/2019  ? ?  Chemistry   ?   ?Component Value Date/Time  ? NA 141 01/29/2021 0858  ? K 4.4 01/29/2021 0858  ? CL 107 01/29/2021 0858  ? CO2 27 01/29/2021 0858  ? BUN 9 01/29/2021 0858  ? CREATININE 0.69 01/29/2021 0858  ?    ?Component Value Date/Time  ? CALCIUM 8.9 01/29/2021 0858  ? ALKPHOS 109 12/25/2018 1002  ? AST 17 01/29/2021 0858  ? ALT 17 01/29/2021 0858  ? BILITOT 0.3 01/29/2021 0858  ?  ? ? ? ? ? ?Total time spent on today's visit was 31 minutes, including both face-to-face time and  nonface-to-face time.  Time included that spent on review of records (prior notes available to me/labs/imaging if pertinent), discussing treatment and goals, answering patient's questions and coordinating care. ? ?Cc:  Hali Marry, MD ? ?

## 2021-08-04 ENCOUNTER — Other Ambulatory Visit: Payer: Self-pay

## 2021-08-04 ENCOUNTER — Ambulatory Visit: Payer: No Typology Code available for payment source | Admitting: Neurology

## 2021-08-04 VITALS — BP 119/79 | HR 65 | Ht 65.0 in | Wt 139.8 lb

## 2021-08-04 DIAGNOSIS — G250 Essential tremor: Secondary | ICD-10-CM

## 2021-08-13 ENCOUNTER — Encounter: Payer: Self-pay | Admitting: Podiatry

## 2021-08-13 ENCOUNTER — Ambulatory Visit (INDEPENDENT_AMBULATORY_CARE_PROVIDER_SITE_OTHER): Payer: No Typology Code available for payment source | Admitting: Podiatry

## 2021-08-13 DIAGNOSIS — B351 Tinea unguium: Secondary | ICD-10-CM

## 2021-08-13 NOTE — Progress Notes (Signed)
?  Subjective:  ?Patient ID: Michelle Donovan, female    DOB: 01-Dec-1945,   MRN: [id number] ? ?Chief Complaint  ?Patient presents with  ? Nail Problem  ?  Toenail fungus on some of the toenails and I have not tried any over the counter stuff   ? Foot Pain  ?  Feels like there are a pair of socks bunched under the toes on both feet   ? ? ?76 y.o. female presents for concern of bilateral toenail fungus. Relates that for about 6 months she has noticed some discoloration in all of her toes. Relates she wears nail polish all summer but keeps them open during the winter. She is not diabetic but does relates some odd sensations in her feet.  Denies any other pedal complaints. Denies n/v/f/c.  ? ?Past Medical History:  ?Diagnosis Date  ? Allergic rhinitis, cause unspecified   ? Asthma   ? not bad per pt  ? Dermatophytosis of scalp and beard   ? Diverticulosis   ? Essential and other specified forms of tremor   ? Fatty liver   ? Fatty liver disease, nonalcoholic 8341  ? GERD (gastroesophageal reflux disease)   ? HTN (hypertension)   ? history of  ? Irritable bowel syndrome   ? Lumbago   ? bulging disk per MRI   ? Mixed hyperlipidemia   ? Other acute reactions to stress   ? Other diseases of lung, not elsewhere classified   ? solitary pulm. nodule(left)  ? PONV (postoperative nausea and vomiting)   ? Thyroid nodule   ? Unspecified asthma(493.90)   ? Unspecified vitamin D deficiency   ? ? ?Objective:  ?Physical Exam: ?Vascular: DP/PT pulses 2/4 bilateral. CFT <3 seconds. Normal hair growth on digits. No edema.  ?Skin. No lacerations or abrasions bilateral feet. Nails 1-5 bilateral with white discoloration noted to distal aspect of the nail.  ?Musculoskeletal: MMT 5/5 bilateral lower extremities in DF, PF, Inversion and Eversion. Deceased ROM in DF of ankle joint.  ?Neurological: Sensation intact to light touch.  ? ?Assessment:  ? ?1. Onychomycosis   ? ? ? ?Plan:  ?Patient was evaluated and treated and all questions  answered. ?-Examined patient ?-Discussed treatment options for dystrophic nails  ?-Discussed fungal nail treatment options including oral, topical, and laser treatments.  ?-Patient relates she is not sure any option would be good right now as she wants to keep her nails painted for the summer and she has fatty liver disease.  ?-Recommended using nail polish with tea tree oil and if she has continued problems in the winter we can try a topical at that point.  ?-Patient to return as needed. ? ? ?Lorenda Peck, DPM  ? ? ?

## 2021-08-17 ENCOUNTER — Other Ambulatory Visit: Payer: Self-pay | Admitting: Neurology

## 2021-08-17 DIAGNOSIS — G250 Essential tremor: Secondary | ICD-10-CM

## 2021-08-18 ENCOUNTER — Ambulatory Visit: Payer: No Typology Code available for payment source | Admitting: Podiatry

## 2021-08-27 ENCOUNTER — Encounter: Payer: Self-pay | Admitting: Family Medicine

## 2021-09-03 ENCOUNTER — Ambulatory Visit: Payer: No Typology Code available for payment source | Admitting: Podiatry

## 2021-09-07 ENCOUNTER — Ambulatory Visit (INDEPENDENT_AMBULATORY_CARE_PROVIDER_SITE_OTHER): Payer: No Typology Code available for payment source | Admitting: Family Medicine

## 2021-09-07 DIAGNOSIS — Z0000 Encounter for general adult medical examination without abnormal findings: Secondary | ICD-10-CM | POA: Diagnosis not present

## 2021-09-07 NOTE — Progress Notes (Signed)
? ? ?MEDICARE ANNUAL WELLNESS VISIT ? ?09/07/2021 ? ?Telephone Visit Disclaimer ?This Medicare AWV was conducted by telephone due to national recommendations for restrictions regarding the COVID-19 Pandemic (e.g. social distancing).  I verified, using two identifiers, that I am speaking with Michelle Donovan or their authorized healthcare agent. I discussed the limitations, risks, security, and privacy concerns of performing an evaluation and management service by telephone and the potential availability of an in-person appointment in the future. The patient expressed understanding and agreed to proceed.  ?Location of Patient: Home ?Location of Provider (nurse):  In the office. ? ?Subjective:  ? ? ?Michelle Donovan is a 76 y.o. female patient of Metheney, Michelle Kocher, MD who had a Medicare Annual Wellness Visit today via telephone. Arnie is Retired and lives alone. she has 1 child. she reports that she is socially active and does interact with friends/family regularly. she is minimally physically active and enjoys reading. ? ?Patient Care Team: ?Hali Marry, MD as PCP - General (Family Medicine) ?Katherine Mantle, OD as Consulting Physician (Optometry) ?Ludwig Clarks, DO as Consulting Physician (Neurology) ?Trula Slade, DPM as Consulting Physician (Podiatry) ?Darius Bump, [facility] as Pharmacist (Pharmacist) ? ? ?  09/07/2021  ?  2:29 PM 08/04/2021  ?  2:18 PM 01/27/2021  ? 10:45 AM 10/20/2020  ?  7:55 AM 08/21/2020  ?  3:23 PM 07/21/2020  ? 11:11 AM 04/21/2020  ? 10:27 AM  ?Advanced Directives  ?Does Patient Have a Medical Advance Directive? No Yes No No No No No  ?Type of Advance Directive  Living will       ?Would patient like information on creating a medical advance directive? No - Patient declined     No - Patient declined   ? ? ?Hospital Utilization Over the Past 12 Months: ?# of hospitalizations or ER visits: 0 ?# of surgeries: 0 ? ?Review of Systems    ?Patient reports that her overall health is  unchanged compared to last year. ? ?History obtained from chart review and the patient ? ?Patient Reported Readings (BP, Pulse, CBG, Weight, etc) ?none ? ?Pain Assessment ?Pain : No/denies pain ? ?  ? ?Current Medications & Allergies (verified) ?Allergies as of 09/07/2021   ? ?   Reactions  ? Tylenol [acetaminophen] Other (See Comments)  ? Increased liver enzymes  ? Codeine Nausea And Vomiting  ? ?  ? ?  ?Medication List  ?  ? ?  ? Accurate as of September 07, 2021  2:50 PM. If you have any questions, ask your nurse or doctor.  ?  ?  ? ?  ? ?CALCIUM 1000 + D PO ?Take 1 tablet by mouth daily. Doesn't take it everyday ?  ?cetirizine 10 MG tablet ?Commonly known as: ZYRTEC ?Take 10 mg by mouth daily. ?  ?clonazePAM 0.5 MG tablet ?Commonly known as: KLONOPIN ?TAKE 1 TABLET BY MOUTH EVERY MORNING AND 1/2TAB AT BEDTIME ?  ?ipratropium 0.06 % nasal spray ?Commonly known as: ATROVENT ?Place 2 sprays into both nostrils 2 (two) times daily. ?  ?montelukast 10 MG tablet ?Commonly known as: SINGULAIR ?TAKE 1 TABLET BY MOUTH EVERYDAY AT BEDTIME ?  ?multivitamin tablet ?Take 1 tablet by mouth daily. ?  ?primidone 50 MG tablet ?Commonly known as: MYSOLINE ?TAKE 3 IN THE MORNING AND 2 AT NIGHT. ?  ?propranolol 60 MG tablet ?Commonly known as: INDERAL ?TAKE 1 TABLET BY MOUTH TWICE A DAY ?  ?rosuvastatin 5 MG tablet ?Commonly known as: Crestor ?  Take 1 tablet (5 mg total) by mouth daily. ?  ?triamcinolone cream 0.1 % ?Commonly known as: KENALOG ?SMARTSIG:Topical 1-2 Times Daily PRN ?  ?valACYclovir 1000 MG tablet ?Commonly known as: VALTREX ?Take 2 tabs every 12 hours x 1 day as needed for cold sores ?  ?zonisamide 100 MG capsule ?Commonly known as: ZONEGRAN ?Take 2 capsules daily ?  ? ?  ? ? ?History (reviewed): ?Past Medical History:  ?Diagnosis Date  ? Allergic rhinitis, cause unspecified   ? Asthma   ? not bad per pt  ? Dermatophytosis of scalp and beard   ? Diverticulosis   ? Essential and other specified forms of tremor   ? Fatty  liver   ? Fatty liver disease, nonalcoholic 2774  ? GERD (gastroesophageal reflux disease)   ? HTN (hypertension)   ? history of  ? Irritable bowel syndrome   ? Lumbago   ? bulging disk per MRI   ? Mixed hyperlipidemia   ? Other acute reactions to stress   ? Other diseases of lung, not elsewhere classified   ? solitary pulm. nodule(left)  ? PONV (postoperative nausea and vomiting)   ? Thyroid nodule   ? Unspecified asthma(493.90)   ? Unspecified vitamin D deficiency   ? ?Past Surgical History:  ?Procedure Laterality Date  ? ABDOMINAL HYSTERECTOMY    ? BUNIONECTOMY Bilateral 08/2019  ? CHOLECYSTECTOMY N/A 08/23/2016  ? Procedure: LAPAROSCOPIC CHOLECYSTECTOMY;  Surgeon: Clovis Riley, MD;  Location: [facility];  Service: General;  Laterality: N/A;  ? COLONOSCOPY    ? COLONOSCOPY WITH PROPOFOL N/A 01/26/2019  ? Procedure: COLONOSCOPY WITH PROPOFOL;  Surgeon: Jonathon Bellows, MD;  [facility] ENDOSCOPY;  Service: Gastroenterology;  Laterality: N/A;  ? ESOPHAGOGASTRODUODENOSCOPY (EGD) WITH PROPOFOL N/A 03/09/2017  ? Procedure: ESOPHAGOGASTRODUODENOSCOPY (EGD) WITH PROPOFOL;  Surgeon: Jonathon Bellows, MD;  Location: [facility] ENDOSCOPY;  Service: Gastroenterology;  Laterality: N/A;  ? LAPAROSCOPIC TOTAL HYSTERECTOMY  2002  ? LAPAROSCOPIC UNILATERAL SALPINGO OOPHERECTOMY    ? TONSILLECTOMY  1967/68  ? TUBAL LIGATION  1986  ? ?Family History  ?Problem Relation Age of Onset  ? Stroke Mother   ? Tremor Mother   ? Diabetes Mother   ? Heart failure Mother   ? Other Father   ?     Trigeminal neuralgia  ? Diabetes Sister   ? Coronary artery disease Sister   ? Irritable bowel syndrome Sister   ? Tremor Sister   ? Diabetes Brother   ?     x 3  ? Coronary artery disease Brother   ?     x 3  ? Irritable bowel syndrome Brother   ?     x 3  ? Tremor Brother   ?     x 3  ? Breast cancer Other   ?     Aunts and cousin  ? Colon cancer Maternal Grandfather   ? ?Social History  ? ?Socioeconomic History  ? Marital status: Divorced  ?  Spouse name: Not on  file  ? Number of children: 1  ? Years of education: 12th grade  ? Highest education level: High school graduate  ?Occupational History  ? Occupation: retired  ?  Employer: OTHER  ?  Comment: [employer]  ?  Employer: RETIRED  ? Occupation: retired  ?  Employer: [employer]  ?Tobacco Use  ? Smoking status: Former  ?  Types: Cigarettes  ?  Quit date: 05/17/1980  ?  Years since quitting: 41.3  ?  Smokeless tobacco: Never  ?Vaping Use  ? Vaping Use: Never used  ?Substance and Sexual Activity  ? Alcohol use: No  ? Drug use: No  ? Sexual activity: Never  ?Other Topics Concern  ? Not on file  ?Social History Narrative  ? Lives alone. Her daughter lives close by incase she needs anything. She enjoys reading.  ?   ? ?Social Determinants of Health  ? ?Financial Resource Strain: Low Risk   ? Difficulty of Paying Living Expenses: Not hard at all  ?Food Insecurity: No Food Insecurity  ? Worried About Charity fundraiser in the Last Year: Never true  ? Ran Out of Food in the Last Year: Never true  ?Transportation Needs: No Transportation Needs  ? Lack of Transportation (Medical): No  ? Lack of Transportation (Non-Medical): No  ?Physical Activity: Inactive  ? Days of Exercise per Week: 0 days  ? Minutes of Exercise per Session: 0 min  ?Stress: No Stress Concern Present  ? Feeling of Stress : Not at all  ?Social Connections: Socially Isolated  ? Frequency of Communication with Friends and Family: More than three times a week  ? Frequency of Social Gatherings with Friends and Family: Never  ? Attends Religious Services: Never  ? Active Member of Clubs or Organizations: No  ? Attends Archivist Meetings: Never  ? Marital Status: Divorced  ? ? ?Activities of Daily Living ? ?  09/07/2021  ?  2:44 PM  ?In your present state of health, do you have any difficulty performing the following activities:  ?Hearing? 0  ?Vision? 0  ?Difficulty concentrating or making decisions? 0  ?Walking or climbing stairs? 0  ?Dressing or  bathing? 0  ?Doing errands, shopping? 0  ?Preparing Food and eating ? N  ?Using the Toilet? N  ?In the past six months, have you accidently leaked urine? N  ?Do you have problems with loss of bowel control?

## 2021-09-07 NOTE — Patient Instructions (Addendum)
?MEDICARE ANNUAL WELLNESS VISIT ?Health Maintenance Summary and Written Plan of Care ? ?[name] , ? ?Thank you for allowing me to perform your Medicare Annual Wellness Visit and for your ongoing commitment to your health.  ? ?Health Maintenance & Immunization History ?Health Maintenance  ?Topic Date Due  ?? TETANUS/TDAP  01/26/2022 (Originally 11/26/2020)  ?? COVID-19 Vaccine (3 - Moderna risk series) 01/26/2022 (Originally 01/10/2020)  ?? Pneumonia Vaccine 54+ Years old (1 - PCV) 09/08/2022 (Originally 12/18/2012)  ?? Zoster Vaccines- Shingrix (1 of 2) 04/28/2023 (Originally 12/19/1964)  ?? INFLUENZA VACCINE  12/15/2021  ?? MAMMOGRAM  02/20/2023  ?? COLONOSCOPY (Pts 45-71yr Insurance coverage will need to be confirmed)  01/25/2026  ?? DEXA SCAN  Completed  ?? Hepatitis C Screening  Completed  ?? HPV VACCINES  Aged Out  ?? Fecal DNA (Cologuard)  Discontinued  ? ?Immunization History  ?Administered Date(s) Administered  ?? Moderna Sars-Covid-2 Vaccination 11/15/2019, 12/13/2019  ? ? ?These are the patient goals that we discussed: ? Goals Addressed   ?  ?  ?  ?  ?  ? This Visit's Progress  ? ?  Patient Stated (pt-stated)     ?   Would like to be able to build some muscle. ?  ?  ?  ? ?This is a list of Health Maintenance Items that are overdue or due now: ?Pneumococcal vaccine  ?Td vaccine ?Shingrix vaccine ? ?Patient declined the vaccines at this time. ? ?Orders/Referrals Placed Today: ?No orders of the defined types were placed in this encounter. ? ?(Contact our referral department at [phone] you have not spoken with someone about your referral appointment within the next 5 days)  ? ? ?Follow-up with MHali Marry MD as planned ?Medicare wellness visit in one year. ?Patient will access AVS on my chart. ? ? ? ?  ?Health Maintenance, Female ?Adopting a healthy lifestyle and getting preventive care are important in promoting health and wellness. Ask your health care provider about: ?The right schedule for  you to have regular tests and exams. ?Things you can do on your own to prevent diseases and keep yourself healthy. ?What should I know about diet, weight, and exercise? ?Eat a healthy diet ? ?Eat a diet that includes plenty of vegetables, fruits, low-fat dairy products, and lean protein. ?Do not eat a lot of foods that are high in solid fats, added sugars, or sodium. ?Maintain a healthy weight ?Body mass index (BMI) is used to identify weight problems. It estimates body fat based on height and weight. Your health care provider can help determine your BMI and help you achieve or maintain a healthy weight. ?Get regular exercise ?Get regular exercise. This is one of the most important things you can do for your health. Most adults should: ?Exercise for at least 150 minutes each week. The exercise should increase your heart rate and make you sweat (moderate-intensity exercise). ?Do strengthening exercises at least twice a week. This is in addition to the moderate-intensity exercise. ?Spend less time sitting. Even light physical activity can be beneficial. ?Watch cholesterol and blood lipids ?Have your blood tested for lipids and cholesterol at 74years of age, then have this test every 5 years. ?Have your cholesterol levels checked more often if: ?Your lipid or cholesterol levels are high. ?You are older than 74years of age. ?You are at high risk for heart disease. ?What should I know about cancer screening? ?Depending on your health history and family history, you may need to have  cancer screening at various ages. This may include screening for: ?Breast cancer. ?Cervical cancer. ?Colorectal cancer. ?Skin cancer. ?Lung cancer. ?What should I know about heart disease, diabetes, and high blood pressure? ?Blood pressure and heart disease ?High blood pressure causes heart disease and increases the risk of stroke. This is more likely to develop in people who have high blood pressure readings or are overweight. ?Have your  blood pressure checked: ?Every 3-5 years if you are 5-54 years of age. ?Every year if you are 42 years old or older. ?Diabetes ?Have regular diabetes screenings. This checks your fasting blood sugar level. Have the screening done: ?Once every three years after age 52 if you are at a normal weight and have a low risk for diabetes. ?More often and at a younger age if you are overweight or have a high risk for diabetes. ?What should I know about preventing infection? ?Hepatitis B ?If you have a higher risk for hepatitis B, you should be screened for this virus. Talk with your health care provider to find out if you are at risk for hepatitis B infection. ?Hepatitis C ?Testing is recommended for: ?Everyone born from [birth year] through 1963. ?Anyone with known risk factors for hepatitis C. ?Sexually transmitted infections (STIs) ?Get screened for STIs, including gonorrhea and chlamydia, if: ?You are sexually active and are younger than 74 years of age. ?You are older than 74 years of age and your health care provider tells you that you are at risk for this type of infection. ?Your sexual activity has changed since you were last screened, and you are at increased risk for chlamydia or gonorrhea. Ask your health care provider if you are at risk. ?Ask your health care provider about whether you are at high risk for HIV. Your health care provider may recommend a prescription medicine to help prevent HIV infection. If you choose to take medicine to prevent HIV, you should first get tested for HIV. You should then be tested every 3 months for as long as you are taking the medicine. ?Pregnancy ?If you are about to stop having your period (premenopausal) and you may become pregnant, seek counseling before you get pregnant. ?Take 400 to 800 micrograms (mcg) of folic acid every day if you become pregnant. ?Ask for birth control (contraception) if you want to prevent pregnancy. ?Osteoporosis and menopause ?Osteoporosis is a disease in  which the bones lose minerals and strength with aging. This can result in bone fractures. If you are 10 years old or older, or if you are at risk for osteoporosis and fractures, ask your health care provider if you should: ?Be screened for bone loss. ?Take a calcium or vitamin D supplement to lower your risk of fractures. ?Be given hormone replacement therapy (HRT) to treat symptoms of menopause. ?Follow these instructions at home: ?Alcohol use ?Do not drink alcohol if: ?Your health care provider tells you not to drink. ?You are pregnant, may be pregnant, or are planning to become pregnant. ?If you drink alcohol: ?Limit how much you have to: ?0-1 drink a day. ?Know how much alcohol is in your drink. In the [place] one drink equals one 12 oz bottle of beer (355 mL), one 5 oz glass of wine (148 mL), or one 1? oz glass of hard liquor (44 mL). ?Lifestyle ?Do not use any products that contain nicotine or tobacco. These products include cigarettes, chewing tobacco, and vaping devices, such as e-cigarettes. If you need help quitting, ask your health care provider. ?Do not  use street drugs. ?Do not share needles. ?Ask your health care provider for help if you need support or information about quitting drugs. ?General instructions ?Schedule regular health, dental, and eye exams. ?Stay current with your vaccines. ?Tell your health care provider if: ?You often feel depressed. ?You have ever been abused or do not feel safe at home. ?Summary ?Adopting a healthy lifestyle and getting preventive care are important in promoting health and wellness. ?Follow your health care provider's instructions about healthy diet, exercising, and getting tested or screened for diseases. ?Follow your health care provider's instructions on monitoring your cholesterol and blood pressure. ?This information is not intended to replace advice given to you by your health care provider. Make sure you discuss any questions you have with your health care  provider. ?Document Revised: 09/22/2020 Document Reviewed: 09/22/2020 ?Elsevier Patient Education ? [place]. ? ?

## 2021-09-10 ENCOUNTER — Other Ambulatory Visit: Payer: Self-pay | Admitting: Family Medicine

## 2021-09-17 ENCOUNTER — Encounter: Payer: Self-pay | Admitting: Emergency Medicine

## 2021-09-17 ENCOUNTER — Emergency Department
Admission: EM | Admit: 2021-09-17 | Discharge: 2021-09-17 | Disposition: A | Discharge status: Home / Self Care | Payer: No Typology Code available for payment source | Source: Home / Self Care | Attending: Emergency Medicine | Admitting: Emergency Medicine

## 2021-09-17 ENCOUNTER — Encounter (HOSPITAL_COMMUNITY): Payer: Self-pay | Admitting: Emergency Medicine

## 2021-09-17 ENCOUNTER — Emergency Department (HOSPITAL_COMMUNITY)
Admission: EM | Admit: 2021-09-17 | Discharge: 2021-09-18 | Disposition: A | Discharge status: Home / Self Care | Payer: No Typology Code available for payment source | Attending: Emergency Medicine | Admitting: Emergency Medicine

## 2021-09-17 ENCOUNTER — Emergency Department (HOSPITAL_COMMUNITY): Payer: No Typology Code available for payment source

## 2021-09-17 ENCOUNTER — Other Ambulatory Visit: Payer: Self-pay

## 2021-09-17 DIAGNOSIS — R109 Unspecified abdominal pain: Secondary | ICD-10-CM | POA: Diagnosis not present

## 2021-09-17 DIAGNOSIS — I10 Essential (primary) hypertension: Secondary | ICD-10-CM | POA: Insufficient documentation

## 2021-09-17 DIAGNOSIS — J45909 Unspecified asthma, uncomplicated: Secondary | ICD-10-CM | POA: Diagnosis not present

## 2021-09-17 DIAGNOSIS — Z79899 Other long term (current) drug therapy: Secondary | ICD-10-CM | POA: Insufficient documentation

## 2021-09-17 DIAGNOSIS — R1031 Right lower quadrant pain: Secondary | ICD-10-CM

## 2021-09-17 DIAGNOSIS — R110 Nausea: Secondary | ICD-10-CM | POA: Diagnosis not present

## 2021-09-17 DIAGNOSIS — Z7951 Long term (current) use of inhaled steroids: Secondary | ICD-10-CM | POA: Diagnosis not present

## 2021-09-17 DIAGNOSIS — D72829 Elevated white blood cell count, unspecified: Secondary | ICD-10-CM | POA: Insufficient documentation

## 2021-09-17 LAB — CBC WITH DIFFERENTIAL/PLATELET
Abs Immature Granulocytes: 0.03 10*3/uL (ref 0.00–0.07)
Basophils Absolute: 0 10*3/uL (ref 0.0–0.1)
Basophils Relative: 0 %
Eosinophils Absolute: 0.1 10*3/uL (ref 0.0–0.5)
Eosinophils Relative: 1 %
HCT: 42 % (ref 36.0–46.0)
Hemoglobin: 13.7 g/dL (ref 12.0–15.0)
Immature Granulocytes: 0 %
Lymphocytes Relative: 29 %
Lymphs Abs: 2.9 10*3/uL (ref 0.7–4.0)
MCH: 30.1 pg (ref 26.0–34.0)
MCHC: 32.6 g/dL (ref 30.0–36.0)
MCV: 92.3 fL (ref 80.0–100.0)
Monocytes Absolute: 0.5 10*3/uL (ref 0.1–1.0)
Monocytes Relative: 5 %
Neutro Abs: 6.5 10*3/uL (ref 1.7–7.7)
Neutrophils Relative %: 65 %
Platelets: 237 10*3/uL (ref 150–400)
RBC: 4.55 MIL/uL (ref 3.87–5.11)
RDW: 12.8 % (ref 11.5–15.5)
WBC: 10.1 10*3/uL (ref 4.0–10.5)
nRBC: 0 % (ref 0.0–0.2)

## 2021-09-17 LAB — POCT URINALYSIS DIP (MANUAL ENTRY)
Bilirubin, UA: NEGATIVE
Blood, UA: NEGATIVE
Glucose, UA: NEGATIVE mg/dL
Ketones, POC UA: NEGATIVE mg/dL
Nitrite, UA: NEGATIVE
Protein Ur, POC: NEGATIVE mg/dL
Spec Grav, UA: 1.01 (ref 1.010–1.025)
Urobilinogen, UA: 0.2 E.U./dL — AB
pH, UA: 5.5 (ref 5.0–8.0)

## 2021-09-17 LAB — COMPREHENSIVE METABOLIC PANEL
ALT: 14 U/L (ref 0–44)
AST: 17 U/L (ref 15–41)
Albumin: 4.1 g/dL (ref 3.5–5.0)
Alkaline Phosphatase: 101 U/L (ref 38–126)
Anion gap: 6 (ref 5–15)
BUN: 7 mg/dL — ABNORMAL LOW (ref 8–23)
CO2: 28 mmol/L (ref 22–32)
Calcium: 9.6 mg/dL (ref 8.9–10.3)
Chloride: 107 mmol/L (ref 98–111)
Creatinine, Ser: 0.8 mg/dL (ref 0.44–1.00)
GFR, Estimated: >60 mL/min (ref >60)
Glucose, Bld: 111 mg/dL — ABNORMAL HIGH (ref 70–99)
Potassium: 4.1 mmol/L (ref 3.5–5.1)
Sodium: 141 mmol/L (ref 135–145)
Total Bilirubin: 0.4 mg/dL (ref 0.3–1.2)
Total Protein: 7.5 g/dL (ref 6.5–8.1)

## 2021-09-17 LAB — URINALYSIS, ROUTINE W REFLEX MICROSCOPIC
Bilirubin Urine: NEGATIVE
Glucose, UA: NEGATIVE mg/dL
Hgb urine dipstick: NEGATIVE
Ketones, ur: NEGATIVE mg/dL
Nitrite: NEGATIVE
Protein, ur: NEGATIVE mg/dL
Specific Gravity, Urine: 1.014 (ref 1.005–1.030)
WBC, UA: >50 WBC/hpf — ABNORMAL HIGH (ref 0–5)
pH: 5 (ref 5.0–8.0)

## 2021-09-17 LAB — LIPASE, BLOOD: Lipase: 34 U/L (ref 11–51)

## 2021-09-17 MED ORDER — IOHEXOL 350 MG/ML SOLN
75.0000 mL | Freq: Once | INTRAVENOUS | Status: AC | PRN
  Administered 2021-09-17: 75 mL via INTRAVENOUS

## 2021-09-17 NOTE — ED Triage Notes (Signed)
Pt sent to ED by [facility] for further evaluation of RLQ abdominal pain and appendicitis rule out. Pt states she has been having pain x5 days that has worsened. States she has not eaten much today and it is causing nausea.  ?

## 2021-09-17 NOTE — ED Provider Notes (Signed)
HPI ? ?SUBJECTIVE: ? ?Michelle Donovan is a 76 y.o. female who presents with constant waxing and waning, nonmigratory, nonradiating, right lower quadrant pain for the past 5 days that is getting worse.  She describes it as dull alternating with sharp.  She has no nausea, vomiting, fevers, anorexia, back, pelvic pain.  No vaginal odor, discharge.  She is not sexually active.  No urinary complaints.  No abdominal distention.  She had a normal bowel movement this morning without any change in her pain.  She has had similar symptoms like this before in her left lower quadrant which was found to be diverticulitis.  No antipyretic in the past 6 hours.  No aggravating or alleviating factors.  She has not tried anything for this.  Patient has a past medical history of diverticulitis, IBS, hypertension, hyperlipidemia,  nonalcoholic fatty liver she is status post abdominal hysterectomy/oophorectomy, cholecystectomy.  PCP: [facility]. ? ? ?Past Medical History:  ?Diagnosis Date  ? Allergic rhinitis, cause unspecified   ? Asthma   ? not bad per pt  ? Dermatophytosis of scalp and beard   ? Diverticulosis   ? Essential and other specified forms of tremor   ? Fatty liver   ? Fatty liver disease, nonalcoholic 6213  ? GERD (gastroesophageal reflux disease)   ? HTN (hypertension)   ? history of  ? Irritable bowel syndrome   ? Lumbago   ? bulging disk per MRI   ? Mixed hyperlipidemia   ? Other acute reactions to stress   ? Other diseases of lung, not elsewhere classified   ? solitary pulm. nodule(left)  ? PONV (postoperative nausea and vomiting)   ? Thyroid nodule   ? Unspecified asthma(493.90)   ? Unspecified vitamin D deficiency   ? ? ?Past Surgical History:  ?Procedure Laterality Date  ? ABDOMINAL HYSTERECTOMY    ? BUNIONECTOMY Bilateral 08/2019  ? CHOLECYSTECTOMY N/A 08/23/2016  ? Procedure: LAPAROSCOPIC CHOLECYSTECTOMY;  Surgeon: Clovis Riley, MD;  Location: [place];  Service: General;  Laterality: N/A;  ?  COLONOSCOPY    ? COLONOSCOPY WITH PROPOFOL N/A 01/26/2019  ? Procedure: COLONOSCOPY WITH PROPOFOL;  Surgeon: Jonathon Bellows, MD;  Location: [facility] ENDOSCOPY;  Service: Gastroenterology;  Laterality: N/A;  ? ESOPHAGOGASTRODUODENOSCOPY (EGD) WITH PROPOFOL N/A 03/09/2017  ? Procedure: ESOPHAGOGASTRODUODENOSCOPY (EGD) WITH PROPOFOL;  Surgeon: Jonathon Bellows, MD;  Location: [facility] ENDOSCOPY;  Service: Gastroenterology;  Laterality: N/A;  ? LAPAROSCOPIC TOTAL HYSTERECTOMY  2002  ? LAPAROSCOPIC UNILATERAL SALPINGO OOPHERECTOMY    ? TONSILLECTOMY  1967/68  ? TUBAL LIGATION  1986  ? ? ?Family History  ?Problem Relation Age of Onset  ? Stroke Mother   ? Tremor Mother   ? Diabetes Mother   ? Heart failure Mother   ? Other Father   ?     Trigeminal neuralgia  ? Diabetes Sister   ? Coronary artery disease Sister   ? Irritable bowel syndrome Sister   ? Tremor Sister   ? Diabetes Brother   ?     x 3  ? Coronary artery disease Brother   ?     x 3  ? Irritable bowel syndrome Brother   ?     x 3  ? Tremor Brother   ?     x 3  ? Breast cancer Other   ?     Aunts and cousin  ? Colon cancer Maternal Grandfather   ? ? ?Social History  ? ?Tobacco Use  ? Smoking status:  Former  ?  Types: Cigarettes  ?  Quit date: 05/17/1980  ?  Years since quitting: 41.3  ? Smokeless tobacco: Never  ?Vaping Use  ? Vaping Use: Never used  ?Substance Use Topics  ? Alcohol use: No  ? Drug use: No  ? ? ?No current facility-administered medications for this encounter. ? ?Current Outpatient Medications:  ?  Calcium Carb-Cholecalciferol (CALCIUM 1000 + D PO), Take 1 tablet by mouth daily. Doesn't take it everyday, Disp: , Rfl:  ?  cetirizine (ZYRTEC) 10 MG tablet, Take 10 mg by mouth daily., Disp: , Rfl:  ?  clonazePAM (KLONOPIN) 0.5 MG tablet, TAKE 1 TABLET BY MOUTH EVERY MORNING AND 1/2TAB AT BEDTIME, Disp: 45 tablet, Rfl: 4 ?  ipratropium (ATROVENT) 0.06 % nasal spray, Place 2 sprays into both nostrils 2 (two) times daily., Disp: , Rfl:  ?  montelukast (SINGULAIR) 10 MG  tablet, TAKE 1 TABLET BY MOUTH EVERYDAY AT BEDTIME, Disp: 90 tablet, Rfl: 3 ?  Multiple Vitamin (MULTIVITAMIN) tablet, Take 1 tablet by mouth daily., Disp: , Rfl:  ?  primidone (MYSOLINE) 50 MG tablet, TAKE 3 IN THE MORNING AND 2 AT NIGHT., Disp: 450 tablet, Rfl: 0 ?  propranolol (INDERAL) 60 MG tablet, TAKE 1 TABLET BY MOUTH TWICE A DAY, Disp: 180 tablet, Rfl: 0 ?  rosuvastatin (CRESTOR) 5 MG tablet, Take 1 tablet (5 mg total) by mouth daily., Disp: 90 tablet, Rfl: 3 ?  triamcinolone cream (KENALOG) 0.1 %, SMARTSIG:Topical 1-2 Times Daily PRN, Disp: , Rfl:  ?  valACYclovir (VALTREX) 1000 MG tablet, Take 2 tabs every 12 hours x 1 day as needed for cold sores, Disp: 20 tablet, Rfl: 1 ?  zonisamide (ZONEGRAN) 100 MG capsule, Take 2 capsules daily, Disp: 180 capsule, Rfl: 0 ? ?Allergies  ?Allergen Reactions  ? Tylenol [Acetaminophen] Other (See Comments)  ?  Increased liver enzymes  ? Codeine Nausea And Vomiting  ? ? ? ?ROS ? ?As noted in HPI.  ? ?Physical Exam ? ?BP 124/73 (BP Location: Left Arm)   Pulse 60   Temp 98 ?F (36.7 ?C) (Oral)   Resp 16   Ht '5\' 5"'$  (1.651 m)   Wt 62.6 kg   SpO2 99%   BMI 22.96 kg/m?  ? ?Constitutional: Well developed, well nourished, appears slightly uncomfortable. ?Eyes:  EOMI, conjunctiva normal bilaterally ?HENT: Normocephalic, atraumatic,mucus membranes moist ?Respiratory: Normal inspiratory effort, lungs clear bilaterally ?Cardiovascular: Normal rate, regular rhythm, no murmurs rubs or gallop ?GI: Normal appearance, soft, nondistended.  Positive right lower quadrant tenderness with voluntary guarding.  No rebound.  Negative Rovsing.  Hypoactive bowel sounds.  Negative tap table test. ?Back: No CVAT ?skin: No rash, skin intact ?Musculoskeletal: no deformities ?Neurologic: Alert & oriented x 3, no focal neuro deficits ?Psychiatric: Speech and behavior appropriate ? ? ?ED Course ? ? ?Medications - No data to display ? ?Orders Placed This Encounter  ?Procedures  ? POCT urinalysis  dipstick  ?  Standing Status:   Standing  ?  Number of Occurrences:   1  ? ? ?Results for orders placed or performed during the hospital encounter of 09/17/21 (from the past 24 hour(s))  ?POCT urinalysis dipstick     Status: Abnormal  ? Collection Time: 09/17/21  5:26 PM  ?Result Value Ref Range  ? Color, UA yellow yellow  ? Clarity, UA cloudy (A) clear  ? Glucose, UA negative negative mg/dL  ? Bilirubin, UA negative negative  ? Ketones, POC UA negative negative mg/dL  ?  Spec Grav, UA 1.010 1.010 - 1.025  ? Blood, UA negative negative  ? pH, UA 5.5 5.0 - 8.0  ? Protein Ur, POC negative negative mg/dL  ? Urobilinogen, UA 0.2 (A) 0.2 or 1.0 E.U./dL  ? Nitrite, UA Negative Negative  ? Leukocytes, UA Small (1+) (A) Negative  ? ?No results found. ? ?ED Clinical Impression ? ?1. Right lower quadrant abdominal pain   ?  ? ?ED Assessment/Plan ? ?UA is suggestive of a UTI, however, patient has no urinary complaints and she has right lower quadrant, not suprapubic tenderness.  I am concerned about appendicitis or an atypical presentation of diverticulitis especially with voluntary guarding.  Transferring patient to the emergency department-patient would like to go to [facility] further work-up and evaluation.  Advised patient be n.p.o. until ER evaluation is complete.  She is stable to go by private vehicle. ? ?Discussed rationale for transfer to the emergency department with the patient.  She agrees to go. ? ?No orders of the defined types were placed in this encounter. ? ? ? ? ?*This clinic note was created using [employer]. Therefore, there may be occasional mistakes despite careful proofreading. ? ?? ? ?  ?Melynda Ripple, MD ?09/17/21 1809 ? ?

## 2021-09-17 NOTE — ED Triage Notes (Signed)
RLQ pain x 5 days worse today. Denies constipation or urinary sxs. ?

## 2021-09-17 NOTE — Discharge Instructions (Addendum)
Do not have anything to eat or drink until your ER evaluation is complete.  Please let them know if your pain changes or gets worse. ?

## 2021-09-17 NOTE — ED Provider Triage Note (Signed)
Emergency Medicine Provider Triage Evaluation Note ? ?Michelle Donovan , a 76 y.o. female  was evaluated in triage.  Pt complains of right lower quadrant abdominal pain x5 days.  Progressively worsening, associated distention, no fevers or vomiting.  Status post hysterectomy, oophorectomy, cholecystectomy.  Sent in from urgent care for rule out of appendicitis ? ?Review of Systems  ?Positive: RLQ abd pain ?Negative: fever ? ?Physical Exam  ?BP (!) 151/68 (BP Location: Right Arm)   Pulse 65   Temp 98 ?F (36.7 ?C) (Oral)   Resp 18   SpO2 98%  ?Gen:   Awake, no distress   ?Resp:  Normal effort  ?MSK:   Moves extremities without difficulty  ?Other:  TTP RLQ ? ?Medical Decision Making  ?Medically screening exam initiated at 8:39 PM.  Appropriate orders placed.  Sharmayne Jablon was informed that the remainder of the evaluation will be completed by another provider, this initial triage assessment does not replace that evaluation, and the importance of remaining in the ED until their evaluation is complete. ? ?Work up initiated ?  ?Margarita Mail, PA-C ?09/17/21 2042 ? ?

## 2021-09-18 NOTE — Discharge Instructions (Signed)
Follow-up with your primary care doctor work-up here today CAT scan showed no acute abnormalities in the abdomen which is very reassuring.  Labs were normal.  As we discussed urine had some white blood cells in it.  We have sent your urine for culture.  If it grows anything significant you will be contacted.  Your primary care doctor may want to consider making arrangements for a colonoscopy.  Return for any new or worse symptoms. ?

## 2021-09-18 NOTE — ED Provider Notes (Signed)
?[facility] ?Provider Note ? ? ?CSN: [id number] ?Arrival date & time: 09/17/21  1954 ? ?  ? ?History ? ?Chief Complaint  ?Patient presents with  ? Abdominal Pain  ? ? ?Michelle Donovan is a 76 y.o. female. ? ?Patient with a complaint of right lower quadrant abdominal pain for 5 days.  Sometimes sharp in nature but always a dull ache there.  Not made worse by moving her leg.  Patient without any nausea or vomiting.  But her process note did say she has had some nausea.  Patient also denies diarrhea.  Once again patient denied any nausea or vomiting to me.  Patient is actually hungry and wants to eat.  Patient was seen in urgent care and was referred in for concerns for possible appendicitis due to the right lower quadrant pain.  Patient denies any fevers.  Patient denies any dysuria. ? ?Past medical history significant for irritable bowel syndrome diverticulosis asthma hypertension and total hysterectomy.  Patient states she does not have any ovaries. ? ? ?  ? ?Home Medications ?Prior to Admission medications   ?Medication Sig Start Date End Date Taking? Authorizing Provider  ?Calcium Carb-Cholecalciferol (CALCIUM 1000 + D PO) Take 1 tablet by mouth daily. Doesn't take it everyday    [provider]  ?cetirizine (ZYRTEC) 10 MG tablet Take 10 mg by mouth daily.    [provider]  ?clonazePAM (KLONOPIN) 0.5 MG tablet TAKE 1 TABLET BY MOUTH EVERY MORNING AND 1/2TAB AT BEDTIME 08/17/21   Tat, Eustace Quail, DO  ?ipratropium (ATROVENT) 0.06 % nasal spray Place 2 sprays into both nostrils 2 (two) times daily. 03/05/20   [provider]  ?montelukast (SINGULAIR) 10 MG tablet TAKE 1 TABLET BY MOUTH EVERYDAY AT BEDTIME 10/16/20   Hali Marry, MD  ?Multiple Vitamin (MULTIVITAMIN) tablet Take 1 tablet by mouth daily.    [provider]  ?primidone (MYSOLINE) 50 MG tablet TAKE 3 IN THE MORNING AND 2 AT NIGHT. 07/27/21   Tat, Eustace Quail, DO   ?propranolol (INDERAL) 60 MG tablet TAKE 1 TABLET BY MOUTH TWICE A DAY 09/10/21   Hali Marry, MD  ?rosuvastatin (CRESTOR) 5 MG tablet Take 1 tablet (5 mg total) by mouth daily. 02/02/21   Hali Marry, MD  ?triamcinolone cream (KENALOG) 0.1 % SMARTSIG:Topical 1-2 Times Daily PRN 07/25/21   [provider]  ?valACYclovir (VALTREX) 1000 MG tablet Take 2 tabs every 12 hours x 1 day as needed for cold sores 02/19/20   Hali Marry, MD  ?zonisamide (ZONEGRAN) 100 MG capsule Take 2 capsules daily 07/22/21   Tat, Eustace Quail, DO  ?   ? ?Allergies    ?Tylenol [acetaminophen] and Codeine   ? ?Review of Systems   ?Review of Systems  ?Constitutional:  Negative for chills and fever.  ?HENT:  Negative for ear pain and sore throat.   ?Eyes:  Negative for pain and visual disturbance.  ?Respiratory:  Negative for cough and shortness of breath.   ?Cardiovascular:  Negative for chest pain and palpitations.  ?Gastrointestinal:  Positive for abdominal pain and nausea. Negative for blood in stool, diarrhea and vomiting.  ?Genitourinary:  Negative for dysuria, flank pain and hematuria.  ?Musculoskeletal:  Negative for arthralgias and back pain.  ?Skin:  Negative for color change and rash.  ?Neurological:  Negative for seizures and syncope.  ?All other systems reviewed and are negative. ? ?Physical Exam ?Updated Vital Signs ?BP (!) 145/53 (BP Location:  Right Arm)   Pulse 64   Temp 97.7 ?F (36.5 ?C) (Oral)   Resp 16   SpO2 98%  ?Physical Exam ?Vitals and nursing note reviewed.  ?Constitutional:   ?   General: She is not in acute distress. ?   Appearance: She is well-developed. She is not toxic-appearing.  ?HENT:  ?   Head: Normocephalic and atraumatic.  ?   Mouth/Throat:  ?   Mouth: Mucous membranes are moist.  ?Eyes:  ?   Conjunctiva/sclera: Conjunctivae normal.  ?Cardiovascular:  ?   Rate and Rhythm: Normal rate and regular rhythm.  ?   Heart sounds: No murmur heard. ?Pulmonary:  ?   Effort: Pulmonary  effort is normal. No respiratory distress.  ?   Breath sounds: Normal breath sounds.  ?Abdominal:  ?   General: Abdomen is flat. Bowel sounds are normal. There is no distension.  ?   Palpations: Abdomen is soft.  ?   Tenderness: There is no abdominal tenderness. There is no guarding.  ?   Hernia: No hernia is present.  ?Musculoskeletal:     ?   General: No swelling.  ?   Cervical back: Neck supple.  ?Skin: ?   General: Skin is warm and dry.  ?   Capillary Refill: Capillary refill takes less than 2 seconds.  ?Neurological:  ?   General: No focal deficit present.  ?   Mental Status: She is alert and oriented to person, place, and time.  ?Psychiatric:     ?   Mood and Affect: Mood normal.  ? ? ?ED Results / Procedures / Treatments   ?Labs ?(all labs ordered are listed, but only abnormal results are displayed) ?Labs Reviewed  ?COMPREHENSIVE METABOLIC PANEL - Abnormal; Notable for the following components:  ?    Result Value  ? Glucose, Bld 111 (*)   ? BUN 7 (*)   ? All other components within normal limits  ?URINALYSIS, ROUTINE W REFLEX MICROSCOPIC - Abnormal; Notable for the following components:  ? APPearance HAZY (*)   ? Leukocytes,Ua LARGE (*)   ? WBC, UA >50 (*)   ? Bacteria, UA RARE (*)   ? Non Squamous Epithelial 0-5 (*)   ? All other components within normal limits  ?URINE CULTURE  ?CBC WITH DIFFERENTIAL/PLATELET  ?LIPASE, BLOOD  ? ? ?EKG ?None ? ?Radiology ?CT Abdomen Pelvis W Contrast ? ?Result Date: 09/17/2021 ?CLINICAL DATA:  Right lower quadrant abdominal pain EXAM: CT ABDOMEN AND PELVIS WITH CONTRAST TECHNIQUE: Multidetector CT imaging of the abdomen and pelvis was performed using the standard protocol following bolus administration of intravenous contrast. RADIATION DOSE REDUCTION: This exam was performed according to the departmental dose-optimization program which includes automated exposure control, adjustment of the mA and/or kV according to patient size and/or use of iterative reconstruction technique.  CONTRAST:  84m OMNIPAQUE IOHEXOL 350 MG/ML SOLN COMPARISON:  10/22/2019 FINDINGS: Lower Chest: Normal. Hepatobiliary: Normal hepatic contours. No intra- or extrahepatic biliary dilatation. Status post cholecystectomy. Pancreas: Normal pancreas. No ductal dilatation or peripancreatic fluid collection. Spleen: Normal. Adrenals/Urinary Tract: The adrenal glands are normal. No hydronephrosis, nephroureterolithiasis or solid renal mass. The urinary bladder is normal for degree of distention Stomach/Bowel: There is no hiatal hernia. Normal duodenal course and caliber. No small bowel dilatation or inflammation. Rectosigmoid diverticulosis without acute inflammation. The appendix is not visualized. No right lower quadrant inflammation or free fluid. Vascular/Lymphatic: Normal course and caliber of the major abdominal vessels. No abdominal or pelvic lymphadenopathy. Reproductive: Status post  hysterectomy. No adnexal mass. Other: None. Musculoskeletal: No bony spinal canal stenosis or focal osseous abnormality. IMPRESSION: No acute abnormality of the abdomen or pelvis. Electronically Signed   By: Ulyses Jarred M.D.   On: 09/17/2021 22:58   ? ?Procedures ?Procedures  ? ? ?Medications Ordered in ED ?Medications  ?iohexol (OMNIPAQUE) 350 MG/ML injection 75 mL (75 mLs Intravenous Contrast Given 09/17/21 2249)  ? ? ?ED Course/ Medical Decision Making/ A&P ?  ?                        ?Medical Decision Making ?Amount and/or Complexity of Data Reviewed ?Labs: ordered. ? ? ?Patient labs and CT scan without any acute findings.  Urinalysis had some increased white blood cells.  Patient without any true history of dysuria we will send for urine culture and see if it grows anything.  Rest of her work-up negative.  No signs of appendicitis no signs of diverticulitis.  CT says status post hysterectomy no adnexal mass. ? ?Based on the negative work-up this is reassuring while patient follow-up with her primary care doctor.  May consider  colonoscopy.  Patient has had a colonoscopy in the past. ? ? ?Final Clinical Impression(s) / ED Diagnoses ?Final diagnoses:  ?Right lower quadrant abdominal pain  ? ? ?Rx / DC Orders ?ED Discharge Orders   ? ? None  ? ?

## 2021-09-19 LAB — URINE CULTURE

## 2021-09-21 ENCOUNTER — Telehealth: Payer: Self-pay | Admitting: General Practice

## 2021-09-21 NOTE — Telephone Encounter (Signed)
Transition Care Management Follow-up Telephone Call ?Date of discharge and from where: 09/18/21 from [facility] ?How have you been since you were released from the hospital? Still in pain but its tolerable. She did not want to schedule a follow up at this time since the CT scan was clear.  ?Any questions or concerns? No ? ?Items Reviewed: ?Did the pt receive and understand the discharge instructions provided? Yes  ?Medications obtained and verified? No  ?Other? No  ?Any new allergies since your discharge? No  ?Dietary orders reviewed? No ?Do you have support at home? Yes  ? ?Home Care and Equipment/Supplies: ?Were home health services ordered? no ? ?Functional Questionnaire: (I = Independent and D = Dependent) ?ADLs: I ? ?Bathing/Dressing- I ? ?Meal Prep- I ? ?Eating- I ? ?Maintaining continence- I ? ?Transferring/Ambulation- I ? ?Managing Meds- I ? ?Follow up appointments reviewed: ? ?PCP Hospital f/u appt confirmed? No   ?Specialist Hospital f/u appt confirmed? No   ?Are transportation arrangements needed? No  ?If their condition worsens, is the pt aware to call PCP or go to the Emergency Dept.? Yes ?Was the patient provided with contact information for the PCP's office or ED? Yes ?Was to pt encouraged to call back with questions or concerns? Yes  ?

## 2021-10-15 ENCOUNTER — Other Ambulatory Visit: Payer: Self-pay | Admitting: Family Medicine

## 2021-10-21 ENCOUNTER — Other Ambulatory Visit: Payer: Self-pay | Admitting: Neurology

## 2021-10-21 DIAGNOSIS — G250 Essential tremor: Secondary | ICD-10-CM

## 2021-11-05 ENCOUNTER — Other Ambulatory Visit: Payer: Self-pay | Admitting: Neurology

## 2021-11-05 DIAGNOSIS — G250 Essential tremor: Secondary | ICD-10-CM

## 2021-12-08 ENCOUNTER — Other Ambulatory Visit: Payer: Self-pay | Admitting: Family Medicine

## 2021-12-15 ENCOUNTER — Encounter: Payer: Self-pay | Admitting: Neurology

## 2021-12-24 ENCOUNTER — Ambulatory Visit (INDEPENDENT_AMBULATORY_CARE_PROVIDER_SITE_OTHER): Payer: No Typology Code available for payment source | Admitting: Family Medicine

## 2021-12-24 ENCOUNTER — Encounter: Payer: Self-pay | Admitting: Family Medicine

## 2021-12-24 VITALS — BP 107/52 | HR 64 | Ht 65.0 in | Wt 142.0 lb

## 2021-12-24 DIAGNOSIS — R490 Dysphonia: Secondary | ICD-10-CM | POA: Diagnosis not present

## 2021-12-24 DIAGNOSIS — R200 Anesthesia of skin: Secondary | ICD-10-CM

## 2021-12-24 DIAGNOSIS — E538 Deficiency of other specified B group vitamins: Secondary | ICD-10-CM

## 2021-12-24 DIAGNOSIS — R7301 Impaired fasting glucose: Secondary | ICD-10-CM | POA: Diagnosis not present

## 2021-12-24 DIAGNOSIS — R202 Paresthesia of skin: Secondary | ICD-10-CM | POA: Diagnosis not present

## 2021-12-24 DIAGNOSIS — R682 Dry mouth, unspecified: Secondary | ICD-10-CM

## 2021-12-24 DIAGNOSIS — R432 Parageusia: Secondary | ICD-10-CM

## 2021-12-24 DIAGNOSIS — R7309 Other abnormal glucose: Secondary | ICD-10-CM | POA: Diagnosis not present

## 2021-12-24 NOTE — Progress Notes (Signed)
Established Patient Office Visit  Subjective   Patient ID: Michelle Donovan, female    DOB: [birth year]  Age: 76 y.o. MRN: [id number]  Chief Complaint  Patient presents with   Foot Problem    Toes "feel dead" also burning sensation for several months Not getting better   Follow-up    Taste changes, film in mouth x 2 months    HPI  C/o of numbness in her feet and toes for > 2 months.  Says pain in her great toes bilat.  Feels like sock bunched underneath the other toes even though there is nothing there.  No trauma or injury.  She had similar numbness and tingling when she was on Topamax previously but when she discontinued the Topamax it went away.  The only newer medication that she is taking is the Zonegran but she has been on that for a little over a year and has not had any recent dosage changes.  She takes 200 mg once a day.  Interestingly her father was diagnosed with neuropathy in his feet.  He did not have diabetes.  She has no history of diabetes or chemotherapy.  She did have bunion surgery on both great toes about 3 to 4 years ago she did have some complications with healing on the right toe.  Prior history of B12 deficiency.  She also reports feeling like there is a film over her teeth in her mouth even after she brushes her teeth.  It has been going on for little over 2 months as well.  She spoke with her dental hygienist about it and they stopped a special fluoride toothpaste that she was using at night.  She has stopped it since then and has not noticed any improvement in symptoms.  She reports that her sister has commented multiple times that her voice sounds hoarse.  She has not had any throat pain or discomfort.  Things taste salty even things that are sweet taste salty.  She does have dry mouth but says that is been going on for years and has never had this coating sensation before.  She denies any loss of smell.  She says even her lips "do not feel right".    ROS     Objective:     BP (!) 107/52   Pulse 64   Ht _0  (1.651 m)   Wt 142 lb (64.4 kg)   SpO2 97%   BMI 23.63 kg/m    Physical Exam Vitals and nursing note reviewed.  Constitutional:      Appearance: She is well-developed.  HENT:     Head: Normocephalic and atraumatic.     Mouth/Throat:     Mouth: Mucous membranes are dry.     Pharynx: Oropharynx is clear.  Eyes:     Conjunctiva/sclera: Conjunctivae normal.  Cardiovascular:     Rate and Rhythm: Normal rate and regular rhythm.     Heart sounds: Normal heart sounds.  Pulmonary:     Effort: Pulmonary effort is normal.     Breath sounds: Normal breath sounds.  Musculoskeletal:     Cervical back: No tenderness.  Lymphadenopathy:     Cervical: No cervical adenopathy.  Skin:    General: Skin is warm and dry.  Neurological:     Mental Status: She is alert and oriented to person, place, and time.  Psychiatric:        Behavior: Behavior normal.      Results for orders placed or performed  in visit on 12/24/21  B12  Result Value Ref Range   Vitamin B-12 1,206 (H) 200 - 1,100 pg/mL  Sedimentation rate  Result Value Ref Range   Sed Rate 33 (H) 0 - 30 mm/h  TSH  Result Value Ref Range   TSH 0.65 0.40 - 4.50 mIU/L  CBC  Result Value Ref Range   WBC 6.8 3.8 - 10.8 Thousand/uL   RBC 4.34 3.80 - 5.10 Million/uL   Hemoglobin 12.6 11.7 - 15.5 g/dL   HCT 38.3 35.0 - 45.0 %   MCV 88.2 80.0 - 100.0 fL   MCH 29.0 27.0 - 33.0 pg   MCHC 32.9 32.0 - 36.0 g/dL   RDW 12.4 11.0 - 15.0 %   Platelets 211 140 - 400 Thousand/uL   MPV 10.0 7.5 - 12.5 fL  COMPLETE METABOLIC PANEL WITH GFR  Result Value Ref Range   Glucose, Bld 101 (H) 65 - 99 mg/dL   BUN 11 7 - 25 mg/dL   Creat 0.79 0.60 - 1.00 mg/dL   eGFR 78 > OR = 60 mL/min/1.2m   BUN/Creatinine Ratio SEE NOTE: 6 - 22 (calc)   Sodium 140 135 - 146 mmol/L   Potassium 4.5 3.5 - 5.3 mmol/L   Chloride 106 98 - 110 mmol/L   CO2 27 20 - 32 mmol/L   Calcium 9.5 8.6 - 10.4 mg/dL    Total Protein 6.9 6.1 - 8.1 g/dL   Albumin 4.2 3.6 - 5.1 g/dL   Globulin 2.7 1.9 - 3.7 g/dL (calc)   AG Ratio 1.6 1.0 - 2.5 (calc)   Total Bilirubin 0.2 0.2 - 1.2 mg/dL   Alkaline phosphatase (APISO) 110 37 - 153 U/L   AST 12 10 - 35 U/L   ALT 7 6 - 29 U/L  Hemoglobin A1c  Result Value Ref Range   Hgb A1c MFr Bld 5.5 <5.7 % of total Hgb   Mean Plasma Glucose 111 mg/dL   eAG (mmol/L) 6.2 mmol/L      The 10-year ASCVD risk score (Arnett DK, et al., 2019) is: 14.2%    Assessment & Plan:   Problem List Items Addressed This Visit       Endocrine   IFG (impaired fasting glucose)    Plan to check again for diabetes.        Other   Dysgeusia   B12 deficiency    Plan to recheck B12.        Other Visit Diagnoses     Numbness and tingling of both feet    -  Primary   Relevant Orders   B12 (Completed)   Vitamin B6   Vitamin B1   Sedimentation rate (Completed)   TSH (Completed)   CBC (Completed)   COMPLETE METABOLIC PANEL WITH GFR (Completed)   Ambulatory referral to ENT   Hemoglobin A1c (Completed)   Taste sense altered       Relevant Orders   B12 (Completed)   Vitamin B6   Vitamin B1   Sedimentation rate (Completed)   TSH (Completed)   CBC (Completed)   COMPLETE METABOLIC PANEL WITH GFR (Completed)   Ambulatory referral to ENT   Hemoglobin A1c (Completed)   Voice hoarseness       Relevant Orders   B12 (Completed)   Vitamin B6   Vitamin B1   Sedimentation rate (Completed)   TSH (Completed)   CBC (Completed)   COMPLETE METABOLIC PANEL WITH GFR (Completed)   Ambulatory referral to ENT  Dry mouth          Bilateral foot numbness and tingling with pain-most consistent with neuropathy-we did discuss doing blood work to evaluate for underlying causes she does have a prior history of B12 deficiency and prediabetes.  Will call with those results once available.  Consider potential side effect of her Zonegran.  Will reach out to Dr. Carles Collet , prescriber, and see if  this is a possibility though at this point she is not n nodules and ecessarily interested in stopping the medication.  Voice hoarseness-recommend referral to ENT to look for potential causes including vocal cord nodules or thrush.   Dysgeusia -unclear etiology at this point.  None of her medications have changed significantly so I do not think it is a medication side effect.  She is still able to taste things that just taste different.  Again recommend testing for diabetes, thyroid disorder.  No follow-ups on file.    Beatrice Lecher, MD

## 2021-12-24 NOTE — Assessment & Plan Note (Signed)
Plan to recheck B12.

## 2021-12-24 NOTE — Assessment & Plan Note (Signed)
Plan to check again for diabetes.

## 2021-12-25 ENCOUNTER — Encounter: Payer: Self-pay | Admitting: Neurology

## 2021-12-25 NOTE — Progress Notes (Signed)
Hi Michelle Donovan, your B12 is on the high end of normal so if you are taking extra B12 you can definitely decrease that and cut it in half or take it every other day.  Your inflammatory markers mildly elevated but it is actually much less than it has been over the last couple of years which is actually reassuring.  Thyroid looks great.  Metabolic panel is normal.  No sign of anemia.  A1c at 5.5 so no increase in blood sugars which is good.  Vitamin B1 and vitamin B6 are still pending.

## 2021-12-28 DIAGNOSIS — H52209 Unspecified astigmatism, unspecified eye: Secondary | ICD-10-CM | POA: Diagnosis not present

## 2021-12-28 DIAGNOSIS — H269 Unspecified cataract: Secondary | ICD-10-CM | POA: Diagnosis not present

## 2021-12-28 DIAGNOSIS — H5203 Hypermetropia, bilateral: Secondary | ICD-10-CM | POA: Diagnosis not present

## 2021-12-28 DIAGNOSIS — H524 Presbyopia: Secondary | ICD-10-CM | POA: Diagnosis not present

## 2021-12-29 LAB — COMPLETE METABOLIC PANEL WITH GFR
AG Ratio: 1.6 (calc) (ref 1.0–2.5)
ALT: 7 U/L (ref 6–29)
AST: 12 U/L (ref 10–35)
Albumin: 4.2 g/dL (ref 3.6–5.1)
Alkaline phosphatase (APISO): 110 U/L (ref 37–153)
BUN: 11 mg/dL (ref 7–25)
CO2: 27 mmol/L (ref 20–32)
Calcium: 9.5 mg/dL (ref 8.6–10.4)
Chloride: 106 mmol/L (ref 98–110)
Creat: 0.79 mg/dL (ref 0.60–1.00)
Globulin: 2.7 g/dL (calc) (ref 1.9–3.7)
Glucose, Bld: 101 mg/dL — ABNORMAL HIGH (ref 65–99)
Potassium: 4.5 mmol/L (ref 3.5–5.3)
Sodium: 140 mmol/L (ref 135–146)
Total Bilirubin: 0.2 mg/dL (ref 0.2–1.2)
Total Protein: 6.9 g/dL (ref 6.1–8.1)
eGFR: 78 mL/min/{1.73_m2} (ref >=60)

## 2021-12-29 LAB — HEMOGLOBIN A1C
Hgb A1c MFr Bld: 5.5 % of total Hgb (ref <5.7)
Mean Plasma Glucose: 111 mg/dL
eAG (mmol/L): 6.2 mmol/L

## 2021-12-29 LAB — SEDIMENTATION RATE: Sed Rate: 33 mm/h — ABNORMAL HIGH (ref 0–30)

## 2021-12-29 LAB — TSH: TSH: 0.65 mIU/L (ref 0.40–4.50)

## 2021-12-29 LAB — VITAMIN B12: Vitamin B-12: 1206 pg/mL — ABNORMAL HIGH (ref 200–1100)

## 2021-12-29 LAB — CBC
HCT: 38.3 % (ref 35.0–45.0)
Hemoglobin: 12.6 g/dL (ref 11.7–15.5)
MCH: 29 pg (ref 27.0–33.0)
MCHC: 32.9 g/dL (ref 32.0–36.0)
MCV: 88.2 fL (ref 80.0–100.0)
MPV: 10 fL (ref 7.5–12.5)
Platelets: 211 10*3/uL (ref 140–400)
RBC: 4.34 10*6/uL (ref 3.80–5.10)
RDW: 12.4 % (ref 11.0–15.0)
WBC: 6.8 10*3/uL (ref 3.8–10.8)

## 2021-12-29 LAB — VITAMIN B6: Vitamin B6: 4.8 ng/mL (ref 2.1–21.7)

## 2021-12-29 LAB — VITAMIN B1: Vitamin B1 (Thiamine): 11 nmol/L (ref 8–30)

## 2021-12-30 NOTE — Progress Notes (Signed)
Vitamin B1 and vitamin B6 look good.  So no deficiency indicating a reason for the tingling and numbness in your feet.  Neck step would be to consider a nerve conduction study to evaluate the nerves in the legs and feet if that something that you feel okay would with that, please let me know and we can place the referral.

## 2021-12-31 DIAGNOSIS — C44529 Squamous cell carcinoma of skin of other part of trunk: Secondary | ICD-10-CM | POA: Diagnosis not present

## 2021-12-31 DIAGNOSIS — L820 Inflamed seborrheic keratosis: Secondary | ICD-10-CM | POA: Diagnosis not present

## 2021-12-31 DIAGNOSIS — L570 Actinic keratosis: Secondary | ICD-10-CM | POA: Diagnosis not present

## 2022-01-14 ENCOUNTER — Other Ambulatory Visit: Payer: Self-pay | Admitting: Neurology

## 2022-01-14 DIAGNOSIS — G250 Essential tremor: Secondary | ICD-10-CM

## 2022-01-21 ENCOUNTER — Other Ambulatory Visit: Payer: Self-pay | Admitting: Neurology

## 2022-01-21 DIAGNOSIS — C44529 Squamous cell carcinoma of skin of other part of trunk: Secondary | ICD-10-CM | POA: Diagnosis not present

## 2022-01-21 MED ORDER — ROSUVASTATIN CALCIUM 5 MG PO TABS: 5.0000 mg | ORAL_TABLET | Freq: Every day | ORAL | 0 refills | Status: DC

## 2022-01-28 ENCOUNTER — Ambulatory Visit: Payer: No Typology Code available for payment source | Admitting: Family Medicine

## 2022-02-02 NOTE — Progress Notes (Signed)
Assessment/Plan:    1.  Essential Tremor  -For now she will continue the Zonegran, 100 mg, 2 capsules at night   -Continue propranolol, 60 mg twice per day  -She can continue primidone, 50 mg, 2 in the AM/2 at bed.  She didn't find higher dosages to be effective.  -As with prior visits, I discussed with patient that we really are not going to get adequate tremor control without some type of surgical intervention, either focused ultrasound or DBS.  She has questions about focused ultrasound.  She ultimately stated that she could not go out of the [facility] health network, and we do not offer focused ultrasound but do offer DBS.  Discussed that I thought [facility] was getting a focused ultrasound soon.  -we again discussed 2nd opinion.   I told her that someone else may have another idea that I have not thought about.  She does not want to do that.  Subjective:   Michelle Donovan was seen today in follow up for essential tremor.  My previous records were reviewed prior to todays visit.  She is on zonegran.   She thinks it isn't as good as topamax but "it may have helped maintained."  She wants to stay on the medication.  She is very frustrated.  She has followed up with her primary care.  Last visit March 13.  Note that she was complaining about tremor in that visit.  She has better and worse days but no real "good" days.  She overall feels worse.    Current prescribed movement disorder medications: Clonazepam 0.5 mg, 1 tab in the morning, half tablet in the evening Primidone, 50 mg, 2 tablets twice per day (she was on 3/2 but didn't think that higher dose was helpful) Propranolol 60 mg twice per day Zonegran, 200 mg nightly   Current/Previously tried tremor medications: topamax (tried in 2015/16 with success but had hair loss and stopped; retried again up to 200 mg and had hair loss again and she did not think it was effective and we stopped it; retried again in 2021 at patient request and  felt that it was more effective although had hair loss);  Metoprolol - felt drained ; primidone (50 mg - 1/2 at night but got first dose effect and stopped it); meclizine (? Why); on propranolol now - ? Helping now; gabapentin; artane (no help); trihexyphenidyl 1 mg in the morning in the morning (higher dosages with side effects)  ALLERGIES:   Allergies  Allergen Reactions   Tylenol [Acetaminophen] Other (See Comments)    Increased liver enzymes   Codeine Nausea And Vomiting    CURRENT MEDICATIONS:  Outpatient Encounter Medications as of 02/04/2022  Medication Sig   Calcium Carb-Cholecalciferol (CALCIUM 1000 + D PO) Take 1 tablet by mouth daily. Doesn't take it everyday   cetirizine (ZYRTEC) 10 MG tablet Take 10 mg by mouth daily.   clonazePAM (KLONOPIN) 0.5 MG tablet TAKE 1 TABLET BY MOUTH EVERY MORNING AND 1/2TAB AT BEDTIME   ipratropium (ATROVENT) 0.06 % nasal spray Place 2 sprays into both nostrils 2 (two) times daily.   montelukast (SINGULAIR) 10 MG tablet TAKE 1 TABLET BY MOUTH EVERYDAY AT BEDTIME   Multiple Vitamin (MULTIVITAMIN) tablet Take 1 tablet by mouth daily.   propranolol (INDERAL) 60 MG tablet TAKE 1 TABLET BY MOUTH TWICE A DAY   rosuvastatin (CRESTOR) 5 MG tablet Take 1 tablet (5 mg total) by mouth daily. Labs for refills   triamcinolone  cream (KENALOG) 0.1 % SMARTSIG:Topical 1-2 Times Daily PRN   valACYclovir (VALTREX) 1000 MG tablet Take 2 tabs every 12 hours x 1 day as needed for cold sores   zonisamide (ZONEGRAN) 100 MG capsule TAKE 2 CAPSULES BY MOUTH EVERY DAY   [DISCONTINUED] primidone (MYSOLINE) 50 MG tablet TAKE 3 TABLETS BY MOUTH IN THE MORNING AND 2 AT NIGHT.   primidone (MYSOLINE) 50 MG tablet 2 po bid   No facility-administered encounter medications on file as of 02/04/2022.     Objective:    PHYSICAL EXAMINATION:    VITALS:   Vitals:   02/04/22 1421  BP: 121/75  Pulse: 64  SpO2: 96%  Weight: 144 lb 6.4 oz (65.5 kg)  Height: 5\' 5"  (1.651 m)       Gen:  Appears stated age and in NAD. HEENT:  Normocephalic, atraumatic. The mucous membranes are moist. The superficial temporal arteries are without ropiness or tenderness. Cardiovascular: Regular rate and rhythm. Lungs: Clear to auscultation bilaterally. Neck: There are no carotid bruits noted bilaterally.  NEUROLOGICAL:  Orientation:  The patient is alert and oriented x 3.   Cranial nerves: There is good facial symmetry.  Extraocular muscles are intact and visual fields are full to confrontational testing. Speech is fluent and clear. Soft palate rises symmetrically and there is no tongue deviation. Hearing is intact to conversational tone. Tone: Tone is good throughout. Sensation: Sensation is intact to light touch throughout. Coordination:  The patient has no difficulty with RAM's or FNF bilaterally. Motor: Strength is at least antigravity x4 Abnormal movements: No rest tremor.  There is mild postural tremor.  She has significant difficulty with Archimedes spirals bilaterally.  There is mild chin tremor.     Chemistry      Component Value Date/Time   NA 140 12/24/2021 0000   K 4.5 12/24/2021 0000   CL 106 12/24/2021 0000   CO2 27 12/24/2021 0000   BUN 11 12/24/2021 0000   CREATININE 0.79 12/24/2021 0000      Component Value Date/Time   CALCIUM 9.5 12/24/2021 0000   ALKPHOS 101 09/17/2021 2055   AST 12 12/24/2021 0000   ALT 7 12/24/2021 0000   BILITOT 0.2 12/24/2021 0000      Lab Results  Component Value Date   WBC 6.8 12/24/2021   HGB 12.6 12/24/2021   HCT 38.3 12/24/2021   MCV 88.2 12/24/2021   PLT 211 12/24/2021   Lab Results  Component Value Date   TSH 0.65 12/24/2021     Chemistry      Component Value Date/Time   NA 140 12/24/2021 0000   K 4.5 12/24/2021 0000   CL 106 12/24/2021 0000   CO2 27 12/24/2021 0000   BUN 11 12/24/2021 0000   CREATININE 0.79 12/24/2021 0000      Component Value Date/Time   CALCIUM 9.5 12/24/2021 0000   ALKPHOS 101  09/17/2021 2055   AST 12 12/24/2021 0000   ALT 7 12/24/2021 0000   BILITOT 0.2 12/24/2021 0000         Total time spent on today's visit was 31 minutes, including both face-to-face time and nonface-to-face time.  Time included that spent on review of records (prior notes available to me/labs/imaging if pertinent), discussing treatment and goals, answering patient's questions and coordinating care.  Cc:  Agapito Games, MD

## 2022-02-04 ENCOUNTER — Ambulatory Visit: Payer: No Typology Code available for payment source | Admitting: Neurology

## 2022-02-04 DIAGNOSIS — G250 Essential tremor: Secondary | ICD-10-CM | POA: Diagnosis not present

## 2022-02-04 MED ORDER — PRIMIDONE 50 MG PO TABS: ORAL_TABLET | ORAL | 0 refills | Status: DC

## 2022-02-04 NOTE — Patient Instructions (Signed)
You can consider if you would like a referral for consult for focused ultrasound.    The physicians and staff at [facility] are committed to providing excellent care. You may receive a survey requesting feedback about your experience at our office. We strive to receive "very good" responses to the survey questions. If you feel that your experience would prevent you from giving the office a "very good " response, please contact our office to try to remedy the situation. We may be reached at [phone]. Thank you for taking the time out of your busy day to complete the survey.

## 2022-02-07 ENCOUNTER — Other Ambulatory Visit: Payer: Self-pay | Admitting: Neurology

## 2022-02-07 DIAGNOSIS — G250 Essential tremor: Secondary | ICD-10-CM

## 2022-02-15 ENCOUNTER — Other Ambulatory Visit: Payer: Self-pay | Admitting: Neurology

## 2022-02-15 DIAGNOSIS — G250 Essential tremor: Secondary | ICD-10-CM

## 2022-03-04 DIAGNOSIS — J3489 Other specified disorders of nose and nasal sinuses: Secondary | ICD-10-CM | POA: Diagnosis not present

## 2022-03-04 DIAGNOSIS — J342 Deviated nasal septum: Secondary | ICD-10-CM | POA: Diagnosis not present

## 2022-03-04 DIAGNOSIS — R0981 Nasal congestion: Secondary | ICD-10-CM | POA: Diagnosis not present

## 2022-03-12 ENCOUNTER — Other Ambulatory Visit: Payer: Self-pay | Admitting: Family Medicine

## 2022-03-15 ENCOUNTER — Other Ambulatory Visit: Payer: Self-pay | Admitting: Neurology

## 2022-03-15 DIAGNOSIS — G250 Essential tremor: Secondary | ICD-10-CM

## 2022-03-22 DIAGNOSIS — R0981 Nasal congestion: Secondary | ICD-10-CM | POA: Diagnosis not present

## 2022-03-22 DIAGNOSIS — J3489 Other specified disorders of nose and nasal sinuses: Secondary | ICD-10-CM | POA: Diagnosis not present

## 2022-03-22 DIAGNOSIS — J342 Deviated nasal septum: Secondary | ICD-10-CM | POA: Diagnosis not present

## 2022-03-22 DIAGNOSIS — J323 Chronic sphenoidal sinusitis: Secondary | ICD-10-CM | POA: Diagnosis not present

## 2022-03-29 DIAGNOSIS — C44529 Squamous cell carcinoma of skin of other part of trunk: Secondary | ICD-10-CM | POA: Diagnosis not present

## 2022-03-29 DIAGNOSIS — L820 Inflamed seborrheic keratosis: Secondary | ICD-10-CM | POA: Diagnosis not present

## 2022-04-11 ENCOUNTER — Other Ambulatory Visit: Payer: Self-pay | Admitting: Neurology

## 2022-04-11 DIAGNOSIS — G250 Essential tremor: Secondary | ICD-10-CM

## 2022-04-21 DIAGNOSIS — J323 Chronic sphenoidal sinusitis: Secondary | ICD-10-CM | POA: Diagnosis not present

## 2022-05-01 ENCOUNTER — Other Ambulatory Visit: Payer: Self-pay | Admitting: Family Medicine

## 2022-05-06 ENCOUNTER — Ambulatory Visit: Payer: No Typology Code available for payment source | Admitting: Podiatry

## 2022-05-09 ENCOUNTER — Other Ambulatory Visit: Payer: Self-pay | Admitting: Neurology

## 2022-05-09 DIAGNOSIS — G250 Essential tremor: Secondary | ICD-10-CM

## 2022-05-25 ENCOUNTER — Ambulatory Visit: Payer: No Typology Code available for payment source | Admitting: Podiatry

## 2022-06-09 ENCOUNTER — Other Ambulatory Visit: Payer: Self-pay | Admitting: Family Medicine

## 2022-06-18 ENCOUNTER — Encounter: Payer: Self-pay | Admitting: Emergency Medicine

## 2022-06-18 ENCOUNTER — Ambulatory Visit
Admission: EM | Admit: 2022-06-18 | Discharge: 2022-06-18 | Disposition: A | Discharge status: Home / Self Care | Payer: No Typology Code available for payment source | Attending: Family Medicine | Admitting: Family Medicine

## 2022-06-18 DIAGNOSIS — R509 Fever, unspecified: Secondary | ICD-10-CM | POA: Diagnosis present

## 2022-06-18 DIAGNOSIS — R61 Generalized hyperhidrosis: Secondary | ICD-10-CM

## 2022-06-18 DIAGNOSIS — Z87891 Personal history of nicotine dependence: Secondary | ICD-10-CM | POA: Diagnosis not present

## 2022-06-18 DIAGNOSIS — Z1152 Encounter for screening for COVID-19: Secondary | ICD-10-CM | POA: Insufficient documentation

## 2022-06-18 DIAGNOSIS — R519 Headache, unspecified: Secondary | ICD-10-CM | POA: Diagnosis present

## 2022-06-18 DIAGNOSIS — J323 Chronic sphenoidal sinusitis: Secondary | ICD-10-CM | POA: Diagnosis not present

## 2022-06-18 LAB — POCT URINALYSIS DIP (MANUAL ENTRY)
Bilirubin, UA: NEGATIVE
Blood, UA: NEGATIVE
Glucose, UA: NEGATIVE mg/dL
Ketones, POC UA: NEGATIVE mg/dL
Leukocytes, UA: NEGATIVE
Nitrite, UA: NEGATIVE
Protein Ur, POC: 30 mg/dL — AB
Spec Grav, UA: >=1.03 — AB (ref 1.010–1.025)
Urobilinogen, UA: 0.2 E.U./dL
pH, UA: 6 (ref 5.0–8.0)

## 2022-06-18 LAB — POCT INFLUENZA A/B
Influenza A, POC: NEGATIVE
Influenza B, POC: NEGATIVE

## 2022-06-18 MED ORDER — AMOXICILLIN 875 MG PO TABS: 875.0000 mg | ORAL_TABLET | Freq: Two times a day (BID) | ORAL | 0 refills | Status: DC

## 2022-06-18 NOTE — ED Triage Notes (Signed)
Woke up Wednesday morning with rigors, headache, fatigue. Does not have a working thermometer at home. Has since had a decreased appetite, headache, and lack of energy. Woke up last night drenched in sweat. Reports hx of UTIs, states she's noticed recently that her urine has a strong odor to it. Also states that about a month ago her doctor told her she has a sinus infection behind her eye that is going to require surgery to correct, has not scheduled it yet. Denies visual changes, eye pain, changes to her baseline symptoms with her sinus infection, abdominal pain, dysuria, nausea, vomiting.

## 2022-06-18 NOTE — ED Provider Notes (Signed)
[facility]    CSN: [id number] Arrival date & time: 06/18/22  1227      History   Chief Complaint Chief Complaint  Patient presents with   Fever    HPI Michelle Donovan is a 77 y.o. female.   Two days ago patient awoke with chills, headache, and fatigue. Since then she has had persistent headache, decreased appetite, and "strong odor" of her urine without dysuria, frequency.  At 1:30am today she awoke with sweats. She states that about a month ago her ENT had advised sinus surgery for a diagnosis of chronic sphenoidal sinusitis.  Review of records reveals a CT of the paranasal sinuses done 03/24/22 that showed complete opacification of the left sphenoid sinus with bony thickening progressed since 2008, mild mucosal edema in the maxillary sinuses bilaterally and right frontal sinus.  The history is provided by the patient.    Past Medical History:  Diagnosis Date   Allergic rhinitis, cause unspecified    Asthma    not bad per pt   Dermatophytosis of scalp and beard    Diverticulosis    Essential and other specified forms of tremor    Fatty liver    Fatty liver disease, nonalcoholic 4854   GERD (gastroesophageal reflux disease)    HTN (hypertension)    history of   Irritable bowel syndrome    Lumbago    bulging disk per MRI    Mixed hyperlipidemia    Other acute reactions to stress    Other diseases of lung, not elsewhere classified    solitary pulm. 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   Sleep walking 06/20/2020   Night sweats 12/21/2019   IFG (impaired fasting glucose) 12/21/2019   Bunion of unspecified foot 08/22/2019   History of basal cell cancer 06/01/2019   Edema of left ankle 07/03/2018    ETD (Eustachian tube dysfunction), bilateral 10/04/2017   Dysphagia 02/01/2017   Insomnia 11/05/2014   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/05/2014   Elevated LFTs 10/05/2013   Fatty liver 10/05/2013   Multiple thyroid nodules 08/29/2013   Bilateral arm pain 08/08/2013   Essential tremor 07/19/2013   Constipation, chronic 04/17/2013   Ulnar nerve compression 12/07/2012   Varicosities of leg 07/07/2011   B12 deficiency 10/26/2010   Benign essential hypertension 07/17/2009   Situational anxiety 03/06/2009   Vitamin D deficiency 09/19/2008   PULMONARY NODULE, SOLITARY 09/19/2008   HYPERLIPIDEMIA 02/06/2007   Allergic rhinitis 02/06/2007   Asthma, mild persistent 02/06/2007   IRRITABLE BOWEL SYNDROME 02/06/2007   LOW BACK PAIN, CHRONIC 02/06/2007    Past Surgical History:  Procedure Laterality Date   ABDOMINAL HYSTERECTOMY     BUNIONECTOMY Bilateral 08/2019   CHOLECYSTECTOMY N/A 08/23/2016   Procedure: LAPAROSCOPIC CHOLECYSTECTOMY;  Surgeon: Clovis Riley, MD;  Location: [place];  Service: General;  Laterality: N/A;   COLONOSCOPY     COLONOSCOPY WITH PROPOFOL N/A 01/26/2019   Procedure: COLONOSCOPY WITH PROPOFOL;  Surgeon: Jonathon Bellows, MD;  Location[facility] ENDOSCOPY;  Service: Gastroenterology;  Laterality: N/A;   ESOPHAGOGASTRODUODENOSCOPY (EGD) WITH PROPOFOL N/A 03/09/2017   Procedure: ESOPHAGOGASTRODUODENOSCOPY (EGD) WITH PROPOFOL;  Surgeon: Jonathon Bellows, MD;  Location: [facility] ENDOSCOPY;  Service: Gastroenterology;  Laterality: N/A;  LAPAROSCOPIC TOTAL HYSTERECTOMY  2002   LAPAROSCOPIC UNILATERAL SALPINGO OOPHERECTOMY     TONSILLECTOMY  1967/68   TUBAL LIGATION  1986    OB History   No obstetric history on file.      Home Medications    Prior to Admission medications   Medication Sig Start Date End Date Taking? Authorizing Provider  amoxicillin (AMOXIL) 875 MG tablet Take 1 tablet (875 mg total) by mouth 2 (two) times daily. 06/18/22  Yes Kandra Nicolas, MD  Calcium  Carb-Cholecalciferol (CALCIUM 1000 + D PO) Take 1 tablet by mouth daily. Doesn't take it everyday    [provider]  cetirizine (ZYRTEC) 10 MG tablet Take 10 mg by mouth daily.    [provider]  clonazePAM (KLONOPIN) 0.5 MG tablet TAKE 1 TABLET BY MOUTH EVERY MORNING AND 1/2TAB AT BEDTIME 03/15/22   Tat, Rebecca S, DO  ipratropium (ATROVENT) 0.06 % nasal spray Place 2 sprays into both nostrils 2 (two) times daily. 03/05/20   [provider]  montelukast (SINGULAIR) 10 MG tablet TAKE 1 TABLET BY MOUTH EVERYDAY AT BEDTIME 10/15/21   Hali Marry, MD  Multiple Vitamin (MULTIVITAMIN) tablet Take 1 tablet by mouth daily.    [provider]  primidone (MYSOLINE) 50 MG tablet TAKE 2 TABLETS BY MOUTH IN THE MORNING AND 2 AT NIGHT. 05/11/22   Tat, Eustace Quail, DO  propranolol (INDERAL) 60 MG tablet TAKE 1 TABLET BY MOUTH TWICE A DAY 06/09/22   Hali Marry, MD  rosuvastatin (CRESTOR) 5 MG tablet TAKE 1 TABLET (5 MG TOTAL) BY MOUTH DAILY. LABS FOR REFILLS 05/03/22   Hali Marry, MD  triamcinolone cream (KENALOG) 0.1 % SMARTSIG:Topical 1-2 Times Daily PRN 07/25/21   [provider]  valACYclovir (VALTREX) 1000 MG tablet Take 2 tabs every 12 hours x 1 day as needed for cold sores 02/19/20   Hali Marry, MD  zonisamide (ZONEGRAN) 100 MG capsule TAKE 2 CAPSULES BY MOUTH EVERY DAY 04/12/22   Tat, Eustace Quail, DO    Family History Family History  Problem Relation Age of Onset   Stroke Mother    Tremor Mother    Diabetes Mother    Heart failure Mother    Other Father        Trigeminal neuralgia   Diabetes Sister    Coronary artery disease Sister    Irritable bowel syndrome Sister    Tremor Sister    Diabetes Brother        x 3   Coronary artery disease Brother        x 3   Irritable bowel syndrome Brother        x 3   Tremor Brother        x 3   Breast cancer Other        Aunts and cousin   Colon cancer Maternal  Grandfather     Social History Social History   Tobacco Use   Smoking status: Former    Types: Cigarettes    Quit date: 05/17/1980    Years since quitting: 42.1   Smokeless tobacco: Never  Vaping Use   Vaping Use: Never used  Substance Use Topics   Alcohol use: No   Drug use: No     Allergies   Tylenol [acetaminophen] and Codeine   Review of Systems Review of Systems No sore throat No cough No pleuritic pain No wheezing No nasal congestion No post-nasal drainage No sinus pain/pressure No  itchy/red eyes.  No vision changes. No earache No hemoptysis No SOB No fever/+ chills No nausea No vomiting No abdominal pain No diarrhea No urinary symptoms except "strong odor" No skin rash + fatigue No myalgias +headache   Physical Exam Triage Vital Signs ED Triage Vitals  Enc Vitals Group     BP 06/18/22 1320 134/71     Pulse Rate 06/18/22 1320 66     Resp 06/18/22 1320 16     Temp 06/18/22 1320 99.1 F (37.3 C)     Temp Source 06/18/22 1320 Oral     SpO2 06/18/22 1320 100 %     Weight --      Height --      Head Circumference --      Peak Flow --      Pain Score 06/18/22 1321 0     Pain Loc --      Pain Edu? --      Excl. in [place]? --    No data found.  Updated Vital Signs BP 134/71 (BP Location: Right Arm)   Pulse 66   Temp 99.1 F (37.3 C) (Oral)   Resp 16   SpO2 100%   Visual Acuity Right Eye Distance:   Left Eye Distance:   Bilateral Distance:    Right Eye Near:   Left Eye Near:    Bilateral Near:     Physical Exam Nursing notes and Vital Signs reviewed. Appearance:  Patient appears stated age, and in no acute distress Eyes:  Pupils are equal, round, and reactive to light and accomodation.  Extraocular movement is intact.  Conjunctivae are not inflamed  Ears:  Canals normal.  Tympanic membranes normal.  Nose:  Mildly congested turbinates. Mild maxillary sinus tenderness is present on left. Pharynx:  Normal Neck:  Supple. No adenopathy.   Lungs:  Clear to auscultation.  Breath sounds are equal.  Moving air well. Heart:  Regular rate and rhythm without murmurs, rubs, or gallops.  Abdomen:  Nontender without masses or hepatosplenomegaly.  Bowel sounds are present.  No CVA or flank tenderness.  Extremities:  No edema.  Skin:  No rash present.   UC Treatments / Results  Labs (all labs ordered are listed, but only abnormal results are displayed) Labs Reviewed  POCT URINALYSIS DIP (MANUAL ENTRY) - Abnormal; Notable for the following components:      Result Value   Spec Grav, UA >=1.030 (*)    Protein Ur, POC =30 (*)    All other components within normal limits  SARS CORONAVIRUS 2 (TAT 6-24 HRS)  POCT INFLUENZA A/B negative    EKG   Radiology No results found.  Procedures Procedures (including critical care time)  Medications Ordered in UC Medications - No data to display  Initial Impression / Assessment and Plan / UC Course  I have reviewed the triage vital signs and the nursing notes.  Pertinent labs & imaging results that were available during my care of the patient were reviewed by me and considered in my medical decision making (see chart for details).    Begin empiric amoxicillin for likely sinusitis.  Followup with ENT if not improved about 4 days. COVID19 PCR pending.  Final Clinical Impressions(s) / UC Diagnoses   Final diagnoses:  Unexplained night sweats     Discharge Instructions      Check temperature daily.  May take Tylenol as needed. If symptoms become significantly worse during the night or over the weekend, proceed to the local  emergency room.     ED Prescriptions     Medication Sig Dispense Auth. Provider   amoxicillin (AMOXIL) 875 MG tablet Take 1 tablet (875 mg total) by mouth 2 (two) times daily. 14 tablet Kandra Nicolas, MD         Kandra Nicolas, MD 06/20/22 [phone]

## 2022-06-18 NOTE — Discharge Instructions (Signed)
Check temperature daily.  May take Tylenol as needed. If symptoms become significantly worse during the night or over the weekend, proceed to the local emergency room.

## 2022-06-19 ENCOUNTER — Telehealth: Payer: Self-pay | Admitting: Emergency Medicine

## 2022-06-19 LAB — SARS CORONAVIRUS 2 (TAT 6-24 HRS): SARS Coronavirus 2: NEGATIVE

## 2022-06-19 NOTE — Telephone Encounter (Signed)
Attempted to contact patient as a follow up call from her visit on yesterday.  No answer.

## 2022-07-07 ENCOUNTER — Other Ambulatory Visit: Payer: Self-pay | Admitting: Neurology

## 2022-07-07 DIAGNOSIS — G250 Essential tremor: Secondary | ICD-10-CM

## 2022-07-14 ENCOUNTER — Encounter (HOSPITAL_BASED_OUTPATIENT_CLINIC_OR_DEPARTMENT_OTHER): Admission: RE | Payer: Self-pay | Source: Home / Self Care

## 2022-07-14 ENCOUNTER — Ambulatory Visit (HOSPITAL_BASED_OUTPATIENT_CLINIC_OR_DEPARTMENT_OTHER)
Admission: RE | Admit: 2022-07-14 | Payer: No Typology Code available for payment source | Source: Home / Self Care | Admitting: Otolaryngology

## 2022-07-14 SURGERY — SINUS SURGERY, ENDOSCOPIC
Anesthesia: General | Laterality: Left

## 2022-07-26 ENCOUNTER — Other Ambulatory Visit: Payer: Self-pay | Admitting: Family Medicine

## 2022-08-05 ENCOUNTER — Telehealth: Payer: Self-pay | Admitting: Internal Medicine

## 2022-08-05 NOTE — Telephone Encounter (Signed)
Good Morning Dr Carlean Purl  We received a call from patient wanting to tr ansfr GI care back to our practice.  Patient is currently seeing digestive health but before was a previous patient of yours. She is wanting to be seen for a second opinion. Records are available for review in epic. Please review and advise on scheduling.   Thank you

## 2022-08-06 ENCOUNTER — Other Ambulatory Visit: Payer: Self-pay | Admitting: Neurology

## 2022-08-06 DIAGNOSIS — G250 Essential tremor: Secondary | ICD-10-CM

## 2022-08-07 NOTE — Telephone Encounter (Signed)
Unable to accommodate patient's request at this time due to backlog of patients without established gastroenterology care not sure we will in the future anytime soon either

## 2022-08-11 NOTE — Telephone Encounter (Signed)
Patient advised.

## 2022-08-11 NOTE — Progress Notes (Unsigned)
Assessment/Plan:   1.  Essential Tremor  -For now she will continue the Zonegran, 100 mg, 2 capsules at night   -Continue propranolol, 60 mg twice per day  -She can continue primidone, 50 mg, 2 in the AM/2 at bed.  She didn't find higher dosages to be effective.  -She knows that we will not be able to get adequate tremor control without surgical intervention, either focused ultrasound or DBS.  She does not want DBS therapy and does not wish to exit the [facility] health network for focused ultrasound, at least not yet.  At some point in the near future, Botox may be approved for ET as well.  -Second opinions have been offered.  Subjective:   Michelle Donovan was seen today in follow up for essential tremor.  My previous records were reviewed prior to todays visit.  Unfortunately, tremor persists - "it gets worse continuously."  She remains on the same medications.  She is tolerating them.  She has been to ENT since last visit.  They diagnosed chronic sphenoethmoidal sinusitis and recommended surgery.  That surgery was recently canceled by the patient.  Current prescribed movement disorder medications: Clonazepam 0.5 mg, 1 tab in the morning, half tablet in the evening Primidone, 50 mg, 2 tablets twice per day (she was on 3/2 but didn't think that higher dose was helpful) Propranolol 60 mg twice per day Zonegran, 200 mg nightly   Current/Previously tried tremor medications: topamax (tried in 2015/16 with success but had hair loss and stopped; retried again up to 200 mg and had hair loss again and she did not think it was effective and we stopped it; retried again in 2021 at patient request and felt that it was more effective although had hair loss);  Metoprolol - felt drained ; primidone (50 mg - 1/2 at night but got first dose effect and stopped it); meclizine (? Why); on propranolol now - ? Helping now; gabapentin; artane (no help); trihexyphenidyl 1 mg in the morning in the morning (higher  dosages with side effects)  ALLERGIES:   Allergies  Allergen Reactions   Tylenol [Acetaminophen] Other (See Comments)    Increased liver enzymes   Codeine Nausea And Vomiting    CURRENT MEDICATIONS:  Outpatient Encounter Medications as of 08/12/2022  Medication Sig   amoxicillin (AMOXIL) 875 MG tablet Take 1 tablet (875 mg total) by mouth 2 (two) times daily.   Calcium Carb-Cholecalciferol (CALCIUM 1000 + D PO) Take 1 tablet by mouth daily. Doesn't take it everyday   cetirizine (ZYRTEC) 10 MG tablet Take 10 mg by mouth daily.   clonazePAM (KLONOPIN) 0.5 MG tablet TAKE 1 TABLET BY MOUTH EVERY MORNING AND 1/2TAB AT BEDTIME   ipratropium (ATROVENT) 0.06 % nasal spray Place 2 sprays into both nostrils 2 (two) times daily.   montelukast (SINGULAIR) 10 MG tablet TAKE 1 TABLET BY MOUTH EVERYDAY AT BEDTIME   Multiple Vitamin (MULTIVITAMIN) tablet Take 1 tablet by mouth daily.   primidone (MYSOLINE) 50 MG tablet TAKE 2 TABLETS BY MOUTH IN THE MORNING AND 2 AT NIGHT.   propranolol (INDERAL) 60 MG tablet TAKE 1 TABLET BY MOUTH TWICE A DAY   rosuvastatin (CRESTOR) 5 MG tablet TAKE 1 TABLET (5 MG TOTAL) BY MOUTH DAILY. LABS FOR REFILLS   triamcinolone cream (KENALOG) 0.1 % SMARTSIG:Topical 1-2 Times Daily PRN   valACYclovir (VALTREX) 1000 MG tablet Take 2 tabs every 12 hours x 1 day as needed for cold sores   zonisamide (ZONEGRAN)  100 MG capsule TAKE 2 CAPSULES BY MOUTH EVERY DAY   No facility-administered encounter medications on file as of 08/12/2022.     Objective:    PHYSICAL EXAMINATION:    VITALS:   Vitals:   08/12/22 1412  BP: 122/70  Pulse: 70  SpO2: 98%  Weight: 143 lb (64.9 kg)    Gen:  Appears stated age and in NAD. HEENT:  Normocephalic, atraumatic. The mucous membranes are moist. The superficial temporal arteries are without ropiness or tenderness. Cardiovascular: Regular rate and rhythm. Lungs: Clear to auscultation bilaterally. Neck: There are no carotid bruits noted  bilaterally.  NEUROLOGICAL:  Orientation:  The patient is alert and oriented x 3.   Cranial nerves: There is good facial symmetry.  Extraocular muscles are intact and visual fields are full to confrontational testing. Speech is fluent and clear.  Hearing is intact to conversational tone. Tone: Tone is good throughout. Sensation: Sensation is intact to light touch throughout. Coordination:  The patient has no difficulty with RAM's or FNF bilaterally. Motor: Strength is at least antigravity x4 Abnormal movements: No rest tremor.  There is mild postural tremor.  She has significant difficulty with Archimedes spirals bilaterally.  This is similar to previous.  There is mild chin tremor.     Chemistry      Component Value Date/Time   NA 140 12/24/2021 0000   K 4.5 12/24/2021 0000   CL 106 12/24/2021 0000   CO2 27 12/24/2021 0000   BUN 11 12/24/2021 0000   CREATININE 0.79 12/24/2021 0000      Component Value Date/Time   CALCIUM 9.5 12/24/2021 0000   ALKPHOS 101 09/17/2021 2055   AST 12 12/24/2021 0000   ALT 7 12/24/2021 0000   BILITOT 0.2 12/24/2021 0000      Lab Results  Component Value Date   WBC 6.8 12/24/2021   HGB 12.6 12/24/2021   HCT 38.3 12/24/2021   MCV 88.2 12/24/2021   PLT 211 12/24/2021   Lab Results  Component Value Date   TSH 0.65 12/24/2021     Chemistry      Component Value Date/Time   NA 140 12/24/2021 0000   K 4.5 12/24/2021 0000   CL 106 12/24/2021 0000   CO2 27 12/24/2021 0000   BUN 11 12/24/2021 0000   CREATININE 0.79 12/24/2021 0000      Component Value Date/Time   CALCIUM 9.5 12/24/2021 0000   ALKPHOS 101 09/17/2021 2055   AST 12 12/24/2021 0000   ALT 7 12/24/2021 0000   BILITOT 0.2 12/24/2021 0000        Cc:  Hali Marry, MD

## 2022-08-12 ENCOUNTER — Ambulatory Visit: Payer: No Typology Code available for payment source | Admitting: Neurology

## 2022-08-12 ENCOUNTER — Encounter: Payer: Self-pay | Admitting: Neurology

## 2022-08-12 VITALS — BP 122/70 | HR 70 | Wt 143.0 lb

## 2022-08-12 DIAGNOSIS — G250 Essential tremor: Secondary | ICD-10-CM | POA: Diagnosis not present

## 2022-08-12 MED ORDER — CLONAZEPAM 0.5 MG PO TABS: ORAL_TABLET | ORAL | 5 refills | Status: DC

## 2022-08-12 NOTE — Patient Instructions (Signed)
Let me know if you are interested in focused ultrasound and we can send a referral to [facility].    The physicians and staff at [facility] are committed to providing excellent care. You may receive a survey requesting feedback about your experience at our office. We strive to receive "very good" responses to the survey questions. If you feel that your experience would prevent you from giving the office a "very good " response, please contact our office to try to remedy the situation. We may be reached at [phone]. Thank you for taking the time out of your busy day to complete the survey.

## 2022-08-26 ENCOUNTER — Encounter: Payer: Self-pay | Admitting: Family Medicine

## 2022-08-26 ENCOUNTER — Ambulatory Visit (INDEPENDENT_AMBULATORY_CARE_PROVIDER_SITE_OTHER): Payer: No Typology Code available for payment source | Admitting: Family Medicine

## 2022-08-26 ENCOUNTER — Ambulatory Visit: Payer: No Typology Code available for payment source

## 2022-08-26 VITALS — BP 124/40 | HR 55 | Ht 65.0 in | Wt 141.2 lb

## 2022-08-26 DIAGNOSIS — R109 Unspecified abdominal pain: Secondary | ICD-10-CM

## 2022-08-26 DIAGNOSIS — K5909 Other constipation: Secondary | ICD-10-CM

## 2022-08-26 DIAGNOSIS — R197 Diarrhea, unspecified: Secondary | ICD-10-CM

## 2022-08-26 DIAGNOSIS — R140 Abdominal distension (gaseous): Secondary | ICD-10-CM | POA: Diagnosis not present

## 2022-08-26 LAB — CBC WITH DIFFERENTIAL/PLATELET
Basophils Relative: 0.3 %
MCV: 85.9 fL (ref 80.0–100.0)
MPV: 10.4 fL (ref 7.5–12.5)
Neutro Abs: 3610 cells/uL (ref 1500–7800)
Platelets: 221 10*3/uL (ref 140–400)
RDW: 13.1 % (ref 11.0–15.0)
Total Lymphocyte: 33.5 %
WBC: 6.3 10*3/uL (ref 3.8–10.8)

## 2022-08-26 NOTE — Progress Notes (Signed)
Established Patient Office Visit  Subjective   Patient ID: Michelle Donovan, female    DOB: 03/02/1946  Age: 77 y.o. MRN: [id number]  Chief Complaint  Patient presents with   Diarrhea   Constipation   Abdominal Pain    Patient in office c/o abdominal pain, bowel movements that vary form diarrhea to constipation.states has chronic constipatin x at least once per month and extreme difficulty passing stools - feels wiped out after passing stool -feels like when she eats food goes right through her x symptoms over a year but seem to be increasing-  states she uses Benefiber daily and helped in past but is no longer helping.     HPI    Patient in office c/o abdominal pain, bowel movements that vary from watery diarrhea to constipation.states has chronic constipatin x at least once per month and extreme difficulty passing stools - feels wiped out after passing stool -feels like when she eats food goes right through her x symptoms over a year but seem to be increasing-  states she uses Benefiber daily and helped in past but is no longer helping.   She constantly feels bloated.  She is not passing any blood just may be a little irritation from some hemorrhoids.  She has lost weight and is down to 141 pounds.  She has had significantly decreased appetite.  Sometimes her stomach hurts after she eats.  She is really been trying hard to drink more water as she does struggle with that.  She is to take Benefiber and that would work really well to regulate her stools but now it is not doing anything.    ROS    Objective:     BP (!) 124/40   Pulse (!) 55   Ht 5\' 5"  (1.651 m)   Wt 141 lb 4 oz (64.1 kg)   SpO2 97%   BMI 23.51 kg/m    Physical Exam Vitals and nursing note reviewed.  Constitutional:      Appearance: She is well-developed.  HENT:     Head: Normocephalic and atraumatic.  Cardiovascular:     Rate and Rhythm: Normal rate and regular rhythm.     Heart sounds: Normal heart  sounds.  Pulmonary:     Effort: Pulmonary effort is normal.     Breath sounds: Normal breath sounds.  Abdominal:     General: Bowel sounds are increased.     Palpations: Abdomen is soft. There is no hepatomegaly.     Tenderness: There is abdominal tenderness.       Comments: Tender over the left mid to mid.  Skin:    General: Skin is warm and dry.  Neurological:     Mental Status: She is alert and oriented to person, place, and time.  Psychiatric:        Behavior: Behavior normal.      No results found for any visits on 08/26/22.    The 10-year ASCVD risk score (Arnett DK, et al., 2019) is: 18.6%    Assessment & Plan:   Problem List Items Addressed This Visit       Digestive   Constipation, chronic   Relevant Orders   CBC with Differential/Platelet   COMPLETE METABOLIC PANEL WITH GFR   Lipase   Ambulatory referral to Gastroenterology   DG Abd 1 View   Other Visit Diagnoses     Diarrhea, unspecified type    -  Primary   Relevant Orders   CBC with  Differential/Platelet   COMPLETE METABOLIC PANEL WITH GFR   Lipase   Ambulatory referral to Gastroenterology   DG Abd 1 View   Abdominal pain, unspecified abdominal location       Relevant Orders   CBC with Differential/Platelet   COMPLETE METABOLIC PANEL WITH GFR   Lipase   Ambulatory referral to Gastroenterology   DG Abd 1 View   Bloating          It sounds like she has had may be years of IBS.  Last colonoscopy was in 2022.  But it sounds like her symptoms have gotten progressively worse and she denies any specific triggers.  No recent changes to diet etc.  Will get some up-to-date labs today just to rule out acute infection, pancreatitis or hepatitis.  Will also get a KUB and refer to GI.  No follow-ups on file.    Nani Gasser, MD

## 2022-08-27 ENCOUNTER — Encounter: Payer: Self-pay | Admitting: Family Medicine

## 2022-08-27 LAB — COMPLETE METABOLIC PANEL WITH GFR
AG Ratio: 1.5 (calc) (ref 1.0–2.5)
ALT: 6 U/L (ref 6–29)
AST: 11 U/L (ref 10–35)
Albumin: 4.3 g/dL (ref 3.6–5.1)
Alkaline phosphatase (APISO): 120 U/L (ref 37–153)
BUN: 10 mg/dL (ref 7–25)
CO2: 28 mmol/L (ref 20–32)
Calcium: 9.5 mg/dL (ref 8.6–10.4)
Chloride: 106 mmol/L (ref 98–110)
Creat: 0.76 mg/dL (ref 0.60–1.00)
Globulin: 2.8 g/dL (calc) (ref 1.9–3.7)
Glucose, Bld: 86 mg/dL (ref 65–99)
Potassium: 4.4 mmol/L (ref 3.5–5.3)
Sodium: 141 mmol/L (ref 135–146)
Total Bilirubin: 0.3 mg/dL (ref 0.2–1.2)
Total Protein: 7.1 g/dL (ref 6.1–8.1)
eGFR: 82 mL/min/{1.73_m2} (ref >=60)

## 2022-08-27 LAB — CBC WITH DIFFERENTIAL/PLATELET
Absolute Monocytes: 454 cells/uL (ref 200–950)
Basophils Absolute: 19 cells/uL (ref 0–200)
Eosinophils Absolute: 107 cells/uL (ref 15–500)
Eosinophils Relative: 1.7 %
HCT: 39.1 % (ref 35.0–45.0)
Hemoglobin: 12.6 g/dL (ref 11.7–15.5)
Lymphs Abs: 2111 cells/uL (ref 850–3900)
MCH: 27.7 pg (ref 27.0–33.0)
MCHC: 32.2 g/dL (ref 32.0–36.0)
Monocytes Relative: 7.2 %
Neutrophils Relative %: 57.3 %
RBC: 4.55 10*6/uL (ref 3.80–5.10)

## 2022-08-27 LAB — LIPASE: Lipase: 25 U/L (ref 7–60)

## 2022-08-29 NOTE — Progress Notes (Signed)
HI Michelle Donovan, your abdominal xray looks OK. Are you feeling any better?

## 2022-08-29 NOTE — Progress Notes (Signed)
Hi Kinjal, your blood count is normal. No sign of pancreatitis. Your live and kidney are normal too. Please see note from our Referral Coordinator

## 2022-08-31 NOTE — Progress Notes (Signed)
Please see if she has a GI appointment scheduled.

## 2022-09-04 ENCOUNTER — Other Ambulatory Visit: Payer: Self-pay | Admitting: Family Medicine

## 2022-09-13 ENCOUNTER — Ambulatory Visit (INDEPENDENT_AMBULATORY_CARE_PROVIDER_SITE_OTHER): Payer: No Typology Code available for payment source | Admitting: Family Medicine

## 2022-09-13 DIAGNOSIS — Z0000 Encounter for general adult medical examination without abnormal findings: Secondary | ICD-10-CM | POA: Diagnosis not present

## 2022-09-13 DIAGNOSIS — Z780 Asymptomatic menopausal state: Secondary | ICD-10-CM

## 2022-09-13 DIAGNOSIS — Z1231 Encounter for screening mammogram for malignant neoplasm of breast: Secondary | ICD-10-CM

## 2022-09-13 NOTE — Patient Instructions (Addendum)
MEDICARE ANNUAL WELLNESS VISIT Health Maintenance Summary and Written Plan of Care  [name] ,  Thank you for allowing me to perform your Medicare Annual Wellness Visit and for your ongoing commitment to your health.   Health Maintenance & Immunization History Health Maintenance  Topic Date Due   DTaP/Tdap/Td (1 - Tdap) Never done   COVID-19 Vaccine (3 - Moderna risk series) 09/29/2022 (Originally 01/10/2020)   Zoster Vaccines- Shingrix (1 of 2) 04/28/2023 (Originally 12/19/1964)   Pneumonia Vaccine 90+ Years old (1 of 1 - PCV) 09/13/2023 (Originally 12/18/2012)   INFLUENZA VACCINE  12/16/2022   MAMMOGRAM  02/20/2023   Medicare Annual Wellness (AWV)  09/13/2023   COLONOSCOPY (Pts 45-7yrs Insurance coverage will need to be confirmed)  01/25/2026   DEXA SCAN  Completed   Hepatitis C Screening  Completed   HPV VACCINES  Aged Out   Fecal DNA (Cologuard)  Discontinued   Immunization History  Administered Date(s) Administered   Moderna Sars-Covid-2 Vaccination 11/15/2019, 12/13/2019    These are the patient goals that we discussed:  Goals Addressed               This Visit's Progress     Patient Stated (pt-stated)        Patient said that she is working on trying to figure out what's going on with her stomach.         This is a list of Health Maintenance Items that are overdue or due now: Health Maintenance Due  Topic Date Due   DTaP/Tdap/Td (1 - Tdap) Never done   Screening mammography Bone densitometry screening  Orders/Referrals Placed Today: Orders Placed This Encounter  Procedures   Mammogram 3D SCREEN BREAST BILATERAL    Standing Status:   Future    Standing Expiration Date:   09/13/2023    Scheduling Instructions:     Please call patient to schedule    Order Specific Question:   Reason for Exam (SYMPTOM  OR DIAGNOSIS REQUIRED)    Answer:   breast cancer screening    Order Specific Question:   Preferred imaging location?    [facility]    DEXAS[facility]    Standing Status:   Future    Standing Expiration Date:   09/13/2023    Scheduling Instructions:     Please call patient to schedule.    Order Specific Question:   Reason for exam:    Answer:   post menopausal    Order Specific Question:   Preferred imaging location?    [facility]   AMB Referral to [facility] Coordinaton ([facility] Patients)    Referral Priority:   Routine    Referral Type:   Consultation    Referral Reason:   Care Coordination    Number of Visits Requested:   1    (Contact our referral department at [phone] if you have not spoken with someone about your referral appointment within the next 5 days)    Follow-up Plan Follow-up with Agapito Games, MD as planned Medicare wellness visit in one year. Patient will access AVS on my chart.        Health Maintenance, Female Adopting a healthy lifestyle and getting preventive care are important in promoting health and wellness. Ask your health care provider about: The right schedule for you to have regular tests and exams. Things you can do on your own to prevent diseases and keep yourself healthy. What should I know about diet,  weight, and exercise? Eat a healthy diet  Eat a diet that includes plenty of vegetables, fruits, low-fat dairy products, and lean protein. Do not eat a lot of foods that are high in solid fats, added sugars, or sodium. Maintain a healthy weight Body mass index (BMI) is used to identify weight problems. It estimates body fat based on height and weight. Your health care provider can help determine your BMI and help you achieve or maintain a healthy weight. Get regular exercise Get regular exercise. This is one of the most important things you can do for your health. Most adults should: Exercise for at least 150 minutes each week. The exercise should increase your heart rate and make you sweat (moderate-intensity exercise). Do strengthening exercises  at least twice a week. This is in addition to the moderate-intensity exercise. Spend less time sitting. Even light physical activity can be beneficial. Watch cholesterol and blood lipids Have your blood tested for lipids and cholesterol at 75 years of age, then have this test every 5 years. Have your cholesterol levels checked more often if: Your lipid or cholesterol levels are high. You are older than 75 years of age. You are at high risk for heart disease. What should I know about cancer screening? Depending on your health history and family history, you may need to have cancer screening at various ages. This may include screening for: Breast cancer. Cervical cancer. Colorectal cancer. Skin cancer. Lung cancer. What should I know about heart disease, diabetes, and high blood pressure? Blood pressure and heart disease High blood pressure causes heart disease and increases the risk of stroke. This is more likely to develop in people who have high blood pressure readings or are overweight. Have your blood pressure checked: Every 3-5 years if you are 68-63 years of age. Every year if you are 42 years old or older. Diabetes Have regular diabetes screenings. This checks your fasting blood sugar level. Have the screening done: Once every three years after age 38 if you are at a normal weight and have a low risk for diabetes. More often and at a younger age if you are overweight or have a high risk for diabetes. What should I know about preventing infection? Hepatitis B If you have a higher risk for hepatitis B, you should be screened for this virus. Talk with your health care provider to find out if you are at risk for hepatitis B infection. Hepatitis C Testing is recommended for: Everyone born from [birth year] through 1963. Anyone with known risk factors for hepatitis C. Sexually transmitted infections (STIs) Get screened for STIs, including gonorrhea and chlamydia, if: You are sexually active  and are younger than 75 years of age. You are older than 75 years of age and your health care provider tells you that you are at risk for this type of infection. Your sexual activity has changed since you were last screened, and you are at increased risk for chlamydia or gonorrhea. Ask your health care provider if you are at risk. Ask your health care provider about whether you are at high risk for HIV. Your health care provider may recommend a prescription medicine to help prevent HIV infection. If you choose to take medicine to prevent HIV, you should first get tested for HIV. You should then be tested every 3 months for as long as you are taking the medicine. Pregnancy If you are about to stop having your period (premenopausal) and you may become pregnant, seek counseling before you  get pregnant. Take 400 to 800 micrograms (mcg) of folic acid every day if you become pregnant. Ask for birth control (contraception) if you want to prevent pregnancy. Osteoporosis and menopause Osteoporosis is a disease in which the bones lose minerals and strength with aging. This can result in bone fractures. If you are 22 years old or older, or if you are at risk for osteoporosis and fractures, ask your health care provider if you should: Be screened for bone loss. Take a calcium or vitamin D supplement to lower your risk of fractures. Be given hormone replacement therapy (HRT) to treat symptoms of menopause. Follow these instructions at home: Alcohol use Do not drink alcohol if: Your health care provider tells you not to drink. You are pregnant, may be pregnant, or are planning to become pregnant. If you drink alcohol: Limit how much you have to: 0-1 drink a day. Know how much alcohol is in your drink. In the [place] one drink equals one 12 oz bottle of beer (355 mL), one 5 oz glass of wine (148 mL), or one 1 oz glass of hard liquor (44 mL). Lifestyle Do not use any products that contain nicotine or tobacco.  These products include cigarettes, chewing tobacco, and vaping devices, such as e-cigarettes. If you need help quitting, ask your health care provider. Do not use street drugs. Do not share needles. Ask your health care provider for help if you need support or information about quitting drugs. General instructions Schedule regular health, dental, and eye exams. Stay current with your vaccines. Tell your health care provider if: You often feel depressed. You have ever been abused or do not feel safe at home. Summary Adopting a healthy lifestyle and getting preventive care are important in promoting health and wellness. Follow your health care provider's instructions about healthy diet, exercising, and getting tested or screened for diseases. Follow your health care provider's instructions on monitoring your cholesterol and blood pressure. This information is not intended to replace advice given to you by your health care provider. Make sure you discuss any questions you have with your health care provider. Document Revised: 09/22/2020 Document Reviewed: 09/22/2020 Elsevier Patient Education  2023 [employer].

## 2022-09-13 NOTE — Progress Notes (Signed)
MEDICARE ANNUAL WELLNESS VISIT  09/13/2022  Telephone Visit Disclaimer This Medicare AWV was conducted by telephone due to national recommendations for restrictions regarding the COVID-19 Pandemic (e.g. social distancing).  I verified, using two identifiers, that I am speaking with Michelle Donovan or their authorized healthcare agent. I discussed the limitations, risks, security, and privacy concerns of performing an evaluation and management service by telephone and the potential availability of an in-person appointment in the future. The patient expressed understanding and agreed to proceed.  Location of Patient: Home Location of Provider (nurse):  in the office  Subjective:    Michelle Donovan is a 77 y.o. female patient of Metheney, Barbarann Ehlers, MD who had a Medicare Annual Wellness Visit today via telephone. Michelle Donovan is Retired and lives alone. she has 1 child. she reports that she is socially active and does interact with friends/family regularly. she is minimally physically active and enjoys reading.  Patient Care Team: Agapito Games, MD as PCP - General (Family Medicine) Theda Belfast, OD as Consulting Physician (Optometry) Tat, Octaviano Batty, DO as Consulting Physician (Neurology) Vivi Barrack, DPM as Consulting Physician (Podiatry) Gabriel Carina, [facility] as Pharmacist (Pharmacist)     09/13/2022    3:06 PM 08/12/2022    2:12 PM 09/17/2021    8:33 PM 09/07/2021    2:29 PM 08/04/2021    2:18 PM 01/27/2021   10:45 AM 10/20/2020    7:55 AM  Advanced Directives  Does Patient Have a Medical Advance Directive? No Yes No No Yes No No  Type of Advance Directive  Living will   Living will    Would patient like information on creating a medical advance directive? No - Patient declined   No - Patient declined       Hospital Utilization Over the Past 12 Months: # of hospitalizations or ER visits: 1 # of surgeries: 0  Review of Systems    Patient reports that her  overall health is unchanged compared to last year.  History obtained from chart review and the patient  Patient Reported Readings (BP, Pulse, CBG, Weight, etc) none  Pain Assessment Pain : No/denies pain     Current Medications & Allergies (verified) Allergies as of 09/13/2022       Reactions   Tylenol [acetaminophen] Other (See Comments)   Increased liver enzymes   Codeine Nausea And Vomiting        Medication List        Accurate as of September 13, 2022  3:24 PM. If you have any questions, ask your nurse or doctor.          CALCIUM 1000 + D PO Take 1 tablet by mouth daily. Doesn't take it everyday   cetirizine 10 MG tablet Commonly known as: ZYRTEC Take 10 mg by mouth daily.   clonazePAM 0.5 MG tablet Commonly known as: KLONOPIN 1 in AM, 1/2 q hs   ipratropium 0.06 % nasal spray Commonly known as: ATROVENT Place 2 sprays into both nostrils 2 (two) times daily.   montelukast 10 MG tablet Commonly known as: SINGULAIR TAKE 1 TABLET BY MOUTH EVERYDAY AT BEDTIME   multivitamin tablet Take 1 tablet by mouth daily.   primidone 50 MG tablet Commonly known as: MYSOLINE TAKE 2 TABLETS BY MOUTH IN THE MORNING AND 2 AT NIGHT.   propranolol 60 MG tablet Commonly known as: INDERAL TAKE 1 TABLET BY MOUTH TWICE A DAY   rosuvastatin 5 MG tablet Commonly known as:  CRESTOR TAKE 1 TABLET (5 MG TOTAL) BY MOUTH DAILY. LABS FOR REFILLS   triamcinolone cream 0.1 % Commonly known as: KENALOG SMARTSIG:Topical 1-2 Times Daily PRN   valACYclovir 1000 MG tablet Commonly known as: VALTREX Take 2 tabs every 12 hours x 1 day as needed for cold sores   zonisamide 100 MG capsule Commonly known as: ZONEGRAN TAKE 2 CAPSULES BY MOUTH EVERY DAY        History (reviewed): Past Medical History:  Diagnosis Date   Allergic rhinitis, cause unspecified    Asthma    not bad per pt   Dermatophytosis of scalp and beard    Diverticulosis    Essential and other specified forms  of tremor    Fatty liver    Fatty liver disease, nonalcoholic 2017   GERD (gastroesophageal reflux disease)    HTN (hypertension)    history of   Irritable bowel syndrome    Lumbago    bulging disk per MRI    Mixed hyperlipidemia    Other acute reactions to stress    Other diseases of lung, not elsewhere classified    solitary pulm. nodule(left)   PONV (postoperative nausea and vomiting)    Thyroid nodule    Unspecified asthma(493.90)    Unspecified vitamin D deficiency    Past Surgical History:  Procedure Laterality Date   ABDOMINAL HYSTERECTOMY     BUNIONECTOMY Bilateral 08/2019   CHOLECYSTECTOMY N/A 08/23/2016   Procedure: LAPAROSCOPIC CHOLECYSTECTOMY;  Surgeon: Berna Bue, MD;  Location: [facility] OR;  Service: General;  Laterality: N/A;   COLONOSCOPY     COLONOSCOPY WITH PROPOFOL N/A 01/26/2019   Procedure: COLONOSCOPY WITH PROPOFOL;  Surgeon: Wyline Mood, MD;  Location: [facility] ENDOSCOPY;  Service: Gastroenterology;  Laterality: N/A;   ESOPHAGOGASTRODUODENOSCOPY (EGD) WITH PROPOFOL N/A 03/09/2017   Procedure: ESOPHAGOGASTRODUODENOSCOPY (EGD) WITH PROPOFOL;  Surgeon: Wyline Mood, MD;  Location: [facility] ENDOSCOPY;  Service: Gastroenterology;  Laterality: N/A;   LAPAROSCOPIC TOTAL HYSTERECTOMY  2002   LAPAROSCOPIC UNILATERAL SALPINGO OOPHERECTOMY     TONSILLECTOMY  1967/68   TUBAL LIGATION  1986   Family History  Problem Relation Age of Onset   Stroke Mother    Tremor Mother    Diabetes Mother    Heart failure Mother    Other Father        Trigeminal neuralgia   Diabetes Sister    Coronary artery disease Sister    Irritable bowel syndrome Sister    Tremor Sister    Diabetes Brother        x 3   Coronary artery disease Brother        x 3   Irritable bowel syndrome Brother        x 3   Tremor Brother        x 3   Breast cancer Other        Aunts and cousin   Colon cancer Maternal Grandfather    Social History   Socioeconomic History   Marital status: Divorced     Spouse name: Not on file   Number of children: 1   Years of education: 12th grade   Highest education level: 12th grade  Occupational History   Occupation: retired    Associate Professor: OTHER    Comment: [place] [employer]    Employer: RETIRED   Occupation: retired    Associate Professor: [employer] [employer]  Tobacco Use   Smoking status: Former    Types: Cigarettes    Quit date: 05/17/1980  Years since quitting: 42.3   Smokeless tobacco: Never  Vaping Use   Vaping Use: Never used  Substance and Sexual Activity   Alcohol use: No   Drug use: No   Sexual activity: Never  Other Topics Concern   Not on file  Social History Narrative   Lives alone. Her sister and daughter lives close by incase she needs anything. She enjoys reading.             Social Determinants of Health   Financial Resource Strain: Medium Risk (09/13/2022)   Overall Financial Resource Strain (CARDIA)    Difficulty of Paying Living Expenses: Somewhat hard  Food Insecurity: Food Insecurity Present (09/13/2022)   Hunger Vital Sign    Worried About Running Out of Food in the Last Year: Sometimes true    Ran Out of Food in the Last Year: Sometimes true  Transportation Needs: No Transportation Needs (09/13/2022)   PRAPARE - Administrator, Civil Service (Medical): No    Lack of Transportation (Non-Medical): No  Physical Activity: Insufficiently Active (09/13/2022)   Exercise Vital Sign    Days of Exercise per Week: 2 days    Minutes of Exercise per Session: 10 min  Stress: No Stress Concern Present (09/13/2022)   [employer] of Occupational Health - Occupational Stress Questionnaire    Feeling of Stress : Not at all  Social Connections: Socially Isolated (09/13/2022)   Social Connection and Isolation Panel [NHANES]    Frequency of Communication with Friends and Family: Twice a week    Frequency of Social Gatherings with Friends and Family: Never    Attends Religious Services: Never    Database administrator or  Organizations: No    Attends Banker Meetings: Never    Marital Status: Divorced    Activities of Daily Living    09/13/2022    3:12 PM  In your present state of health, do you have any difficulty performing the following activities:  Hearing? 0  Vision? 0  Difficulty concentrating or making decisions? 0  Walking or climbing stairs? 1  Comment neuropathy in feet  Dressing or bathing? 0  Doing errands, shopping? 0  Preparing Food and eating ? N  Using the Toilet? N  In the past six months, have you accidently leaked urine? N  Do you have problems with loss of bowel control? N  Managing your Medications? N  Managing your Finances? N  Housekeeping or managing your Housekeeping? N    Patient Education/ Literacy How often do you need to have someone help you when you read instructions, pamphlets, or other written materials from your doctor or pharmacy?: 1 - Never What is the last grade level you completed in school?: 12th grade  Exercise Current Exercise Habits: Home exercise routine, Type of exercise: walking, Time (Minutes): 10, Frequency (Times/Week): 2, Weekly Exercise (Minutes/Week): 20, Intensity: Mild, Exercise limited by: None identified  Diet Patient reports consuming  0-1  meals a day and 0 snack(s) a day Patient reports that her primary diet is: Regular Patient reports that she does have always have regular access to food. Referral placed for case management.  Depression Screen    09/13/2022    3:06 PM 08/26/2022   10:34 AM 12/24/2021    1:37 PM 09/07/2021    2:30 PM 01/26/2021   11:35 AM 07/21/2020   11:11 AM 04/02/2020   11:49 AM  PHQ 2/9 Scores  PHQ - 2 Score 0 0 0 0 0  0 0  PHQ- 9 Score      0      Fall Risk    09/13/2022    3:06 PM 08/26/2022   10:34 AM 08/12/2022    2:12 PM 02/04/2022    2:19 PM 12/24/2021    1:37 PM  Fall Risk   Falls in the past year? 0 0 0 0 1  Number falls in past yr: 0 0 0 0 0  Injury with Fall? 0 0 0 0 0  Risk for fall  due to : No Fall Risks No Fall Risks   History of fall(s)  Follow up Falls evaluation completed Falls evaluation completed Falls evaluation completed  Falls evaluation completed     Objective:  Berthe Oley seemed alert and oriented and she participated appropriately during our telephone visit.  Blood Pressure Weight BMI  BP Readings from Last 3 Encounters:  08/26/22 (!) 124/40  08/12/22 122/70  06/18/22 134/71   Wt Readings from Last 3 Encounters:  08/26/22 141 lb 4 oz (64.1 kg)  08/12/22 143 lb (64.9 kg)  02/04/22 144 lb 6.4 oz (65.5 kg)   BMI Readings from Last 1 Encounters:  08/26/22 23.51 kg/m    *Unable to obtain current vital signs, weight, and BMI due to telephone visit type  Hearing/Vision  Brandalynn did not seem to have difficulty with hearing/understanding during the telephone conversation Reports that she has had a formal eye exam by an eye care professional within the past year Reports that she has not had a formal hearing evaluation within the past year *Unable to fully assess hearing and vision during telephone visit type  Cognitive Function:    09/13/2022    3:19 PM 09/07/2021    2:47 PM 07/21/2020   11:27 AM  6CIT Screen  What Year? 0 points 0 points 0 points  What month? 0 points 0 points 0 points  What time? 0 points 0 points 0 points  Count back from 20 0 points 0 points 0 points  Months in reverse 0 points 0 points 0 points  Repeat phrase 0 points 0 points 0 points  Total Score 0 points 0 points 0 points   (Normal:0-7, Significant for Dysfunction: >8)  Normal Cognitive Function Screening: Yes   Immunization & Health Maintenance Record Immunization History  Administered Date(s) Administered   Moderna Sars-Covid-2 Vaccination 11/15/2019, 12/13/2019    Health Maintenance  Topic Date Due   DTaP/Tdap/Td (1 - Tdap) Never done   COVID-19 Vaccine (3 - Moderna risk series) 09/29/2022 (Originally 01/10/2020)   Zoster Vaccines- Shingrix (1 of 2)  04/28/2023 (Originally 12/19/1966)   Pneumonia Vaccine 75+ Years old (1 of 1 - PCV) 09/13/2023 (Originally 12/18/2012)   INFLUENZA VACCINE  12/16/2022   MAMMOGRAM  02/20/2023   Medicare Annual Wellness (AWV)  09/13/2023   COLONOSCOPY (Pts 45-39yrs Insurance coverage will need to be confirmed)  01/25/2026   DEXA SCAN  Completed   Hepatitis C Screening  Completed   HPV VACCINES  Aged Out   Fecal DNA (Cologuard)  Discontinued       Assessment  This is a routine wellness examination for [employer].  Health Maintenance: Due or Overdue Health Maintenance Due  Topic Date Due   DTaP/Tdap/Td (1 - Tdap) Never done    Michelle Donovan does not need a referral for Community Assistance: Care Management:   yes Social Work:    no Prescription Assistance:  no Nutrition/Diabetes Education:  no   Plan:  Personalized  Goals  Goals Addressed               This Visit's Progress     Patient Stated (pt-stated)        Patient said that she is working on trying to figure out what's going on with her stomach.       Personalized Health Maintenance & Screening Recommendations  Screening mammography Bone densitometry screening TD vaccine Shingles vaccine Pneumonia vaccine  Patient declined the vaccines.   Lung Cancer Screening Recommended: no (Low Dose CT Chest recommended if Age 65-80 years, 30 pack-year currently smoking OR have quit w/in past 15 years) Hepatitis C Screening recommended: no HIV Screening recommended: no  Advanced Directives: Written information was not prepared per patient's request.  Referrals & Orders Orders Placed This Encounter  Procedures   Mammogram 3D SCREEN BREAST BILATERAL   DEXAScan   AMB Referral to Community Care Coordinaton (ACO Patients)    Follow-up Plan Follow-up with Agapito Games, MD as planned Medicare wellness visit in one year. Patient will access AVS on my chart.   I have personally reviewed and noted the following  in the patient's chart:   Medical and social history Use of alcohol, tobacco or illicit drugs  Current medications and supplements Functional ability and status Nutritional status Physical activity Advanced directives List of other physicians Hospitalizations, surgeries, and ER visits in previous 12 months Vitals Screenings to include cognitive, depression, and falls Referrals and appointments  In addition, I have reviewed and discussed with Michelle Donovan certain preventive protocols, quality metrics, and best practice recommendations. A written personalized care plan for preventive services as well as general preventive health recommendations is available and can be mailed to the patient at her request.      Modesto Charon, RN BSN  09/13/2022

## 2022-09-14 ENCOUNTER — Telehealth: Payer: Self-pay

## 2022-09-14 NOTE — Telephone Encounter (Signed)
   Telephone encounter was:  Unsuccessful.  09/14/2022 Name: Michelle Donovan MRN: [id number] DOB: 03/15/1946  Unsuccessful outbound call made today to assist with:  Food Insecurity and financial.  Outreach Attempt:  1st Attempt  A HIPAA compliant voice message was left requesting a return call.  Instructed patient to call back at [phone].  Levada [facility]ersox [facility]  [facility] Population Health Community Resource Care Guide   [email or web address]  ?? [phone]   Website: [email or web address]  [email or web address]

## 2022-09-14 NOTE — Telephone Encounter (Signed)
   Telephone encounter was:  Successful.  09/14/2022 Name: Michelle Donovan MRN: [id number] DOB: 08-02-1945  Michelle Donovan is a 77 y.o. year old female who is a primary care patient of Metheney, Barbarann Ehlers, MD . The community resource team was consulted for assistance with Food Insecurity and financial.  Care guide performed the following interventions: Patient returned call she is not interested in assistance at this time.   Follow Up Plan:  No further follow up planned at this time. The patient has been provided with needed resources.  Lametria Klunk [facility]  [facility] Population Health Community Resource Care Guide   [email or web address]  ?? [phone]   Website: [email or web address]  [email or web address]

## 2022-09-29 DIAGNOSIS — K582 Mixed irritable bowel syndrome: Secondary | ICD-10-CM | POA: Diagnosis not present

## 2022-09-29 DIAGNOSIS — K222 Esophageal obstruction: Secondary | ICD-10-CM | POA: Diagnosis not present

## 2022-09-29 DIAGNOSIS — K5790 Diverticulosis of intestine, part unspecified, without perforation or abscess without bleeding: Secondary | ICD-10-CM | POA: Diagnosis not present

## 2022-09-29 DIAGNOSIS — K760 Fatty (change of) liver, not elsewhere classified: Secondary | ICD-10-CM | POA: Diagnosis not present

## 2022-09-29 DIAGNOSIS — Z86010 Personal history of colonic polyps: Secondary | ICD-10-CM | POA: Diagnosis not present

## 2022-10-06 ENCOUNTER — Other Ambulatory Visit: Payer: Self-pay | Admitting: Neurology

## 2022-10-06 ENCOUNTER — Other Ambulatory Visit: Payer: Self-pay | Admitting: Family Medicine

## 2022-10-06 DIAGNOSIS — G250 Essential tremor: Secondary | ICD-10-CM

## 2022-10-22 ENCOUNTER — Other Ambulatory Visit: Payer: Self-pay | Admitting: Family Medicine

## 2022-11-03 ENCOUNTER — Other Ambulatory Visit: Payer: Self-pay | Admitting: Neurology

## 2022-11-03 DIAGNOSIS — G250 Essential tremor: Secondary | ICD-10-CM

## 2022-11-08 ENCOUNTER — Encounter: Payer: Self-pay | Admitting: Neurology

## 2022-11-08 DIAGNOSIS — G250 Essential tremor: Secondary | ICD-10-CM

## 2022-11-08 MED ORDER — CLONAZEPAM 0.5 MG PO TABS
ORAL_TABLET | ORAL | 3 refills | Status: DC
Start: 2022-11-08 — End: 2023-03-11

## 2022-11-08 NOTE — Telephone Encounter (Signed)
Called [phone] and spoke to [place]. Clonazepam prescription at their location has been canceled for patient.

## 2022-11-29 ENCOUNTER — Encounter: Payer: Self-pay | Admitting: Family Medicine

## 2022-11-29 MED ORDER — NYSTATIN 100000 UNIT/ML MT SUSP: 5.0000 mL | Freq: Four times a day (QID) | OROMUCOSAL | 0 refills | Status: DC

## 2022-11-30 ENCOUNTER — Other Ambulatory Visit: Payer: Self-pay | Admitting: Family Medicine

## 2022-12-03 ENCOUNTER — Other Ambulatory Visit: Payer: Self-pay | Admitting: Family Medicine

## 2022-12-03 NOTE — Telephone Encounter (Signed)
Per pharmacy - Product Backordered/ Unavailable:BACKORDER.

## 2022-12-06 NOTE — Telephone Encounter (Signed)
Unable to reach patient. Voicemail has not been set up.

## 2022-12-06 NOTE — Telephone Encounter (Signed)
Please call patient and let her know that the nystatin powder is backordered according to the pharmacy.  Which she be okay with [place] sending in nystatin cream?

## 2022-12-15 NOTE — Telephone Encounter (Signed)
She did this for about 5 days and after that she reports that her sxs got worse. [facility] is [facility] [address] [address]  She asked if Dr. Linford Arnold may have any clue as to what may be going on in her mouth? She has seen ENT and was told that she would need to have sinus surgery due to a lingering sinus infection. She stated that she didn't fee that this was necessary and didn't feel that she needed to go thru with having this done. She would like to know Dr Shelah Lewandowsky thoughts.

## 2022-12-15 NOTE — Telephone Encounter (Signed)
She finish out the regimen?  Or just do it for 2 days and then stop.  It typically takes a good 3 to 4 days to notice significant improvement in symptoms with the medication and if we do oral Diflucan which we can, it can take up to 5 to 7 days to show improvement.

## 2022-12-15 NOTE — Telephone Encounter (Signed)
Unfortunate only I do not really have any other thoughts.  Typically if it is more fungal or yeasty then the swish and swallow works great to clear that up.  We could consider a second opinion with another ENT if she feels like that would be helpful.  The only thing that we might could consider would be to test for Sjogren's which can actually causes dry eye and dry mouth periods if you are having issues with both and that might be reasonable its usually done with a blood test.

## 2022-12-15 NOTE — Telephone Encounter (Signed)
Michelle Donovan states she had the prescription transferred to [facility]. She took it for a couple of days without relief. She states the coating in her mouth is still there She would like to know the next steps.

## 2022-12-16 ENCOUNTER — Other Ambulatory Visit: Payer: Self-pay

## 2022-12-16 DIAGNOSIS — H04123 Dry eye syndrome of bilateral lacrimal glands: Secondary | ICD-10-CM

## 2022-12-16 DIAGNOSIS — R682 Dry mouth, unspecified: Secondary | ICD-10-CM

## 2022-12-16 NOTE — Telephone Encounter (Signed)
Patient wanting to proceed with blood work for [employer]. She states her eye doctor told her she had dry eyes and she does have dry mouth that she though might be medication related.  She does not want a referral to different ENT. She is thinking she will go ahead with sinus surgery that was recommended by current ENT.

## 2022-12-20 DIAGNOSIS — H04123 Dry eye syndrome of bilateral lacrimal glands: Secondary | ICD-10-CM | POA: Diagnosis not present

## 2022-12-20 DIAGNOSIS — R682 Dry mouth, unspecified: Secondary | ICD-10-CM | POA: Diagnosis not present

## 2022-12-21 NOTE — Progress Notes (Signed)
The Sjogren's antibodies were negative which is reassuring.

## 2022-12-28 ENCOUNTER — Encounter: Payer: Self-pay | Admitting: Family Medicine

## 2022-12-29 MED ORDER — IPRATROPIUM BROMIDE 0.06 % NA SOLN: 2.0000 | Freq: Two times a day (BID) | NASAL | 4 refills | Status: DC

## 2022-12-29 NOTE — Telephone Encounter (Signed)
Meds ordered this encounter  Medications   ipratropium (ATROVENT) 0.06 % nasal spray    Sig: Place 2 sprays into both nostrils 2 (two) times daily.    Dispense:  45 mL    Refill:  4   I do think the surgery could be hepful.  I know the recovery is tough but I do think it could help.

## 2023-01-03 ENCOUNTER — Other Ambulatory Visit: Payer: Self-pay | Admitting: Neurology

## 2023-01-03 DIAGNOSIS — G250 Essential tremor: Secondary | ICD-10-CM

## 2023-01-13 DIAGNOSIS — J342 Deviated nasal septum: Secondary | ICD-10-CM | POA: Diagnosis not present

## 2023-01-13 DIAGNOSIS — R682 Dry mouth, unspecified: Secondary | ICD-10-CM | POA: Diagnosis not present

## 2023-01-13 DIAGNOSIS — J323 Chronic sphenoidal sinusitis: Secondary | ICD-10-CM | POA: Diagnosis not present

## 2023-01-19 ENCOUNTER — Telehealth: Payer: Self-pay | Admitting: Family Medicine

## 2023-01-19 ENCOUNTER — Other Ambulatory Visit: Payer: Self-pay | Admitting: Family Medicine

## 2023-01-19 DIAGNOSIS — I10 Essential (primary) hypertension: Secondary | ICD-10-CM

## 2023-01-19 DIAGNOSIS — J323 Chronic sphenoidal sinusitis: Secondary | ICD-10-CM | POA: Diagnosis not present

## 2023-01-19 DIAGNOSIS — J342 Deviated nasal septum: Secondary | ICD-10-CM | POA: Diagnosis not present

## 2023-01-19 DIAGNOSIS — E782 Mixed hyperlipidemia: Secondary | ICD-10-CM

## 2023-01-19 DIAGNOSIS — R7309 Other abnormal glucose: Secondary | ICD-10-CM

## 2023-01-19 DIAGNOSIS — R682 Dry mouth, unspecified: Secondary | ICD-10-CM | POA: Diagnosis not present

## 2023-01-19 NOTE — Telephone Encounter (Signed)
LVM for patient

## 2023-01-19 NOTE — Telephone Encounter (Signed)
Prescription Request  01/19/2023  LOV: 09/13/2022  What is the name of the medication or equipment? rosuvastatin (CRESTOR) 5 MG tablet   Have you contacted your pharmacy to request a refill? Yes   Which pharmacy would you like this sent to?   [phone] [place] [place] [facility] [address] [place] [address] Phone: [phone] Fax: [phone]    Patient notified that their request is being sent to the clinical staff for review and that they should receive a response within 2 business days.   Please advise at

## 2023-01-19 NOTE — Telephone Encounter (Signed)
Medication was sent. Pt is due for cholesterol lab. This is fasting. She can come by and have this done when she has a chance.

## 2023-01-27 DIAGNOSIS — I10 Essential (primary) hypertension: Secondary | ICD-10-CM | POA: Diagnosis not present

## 2023-01-27 DIAGNOSIS — R7309 Other abnormal glucose: Secondary | ICD-10-CM | POA: Diagnosis not present

## 2023-01-27 DIAGNOSIS — E782 Mixed hyperlipidemia: Secondary | ICD-10-CM | POA: Diagnosis not present

## 2023-01-27 NOTE — Telephone Encounter (Signed)
Orders Placed This Encounter  Procedures   Lipid panel   Basic Metabolic Panel (BMET)   Hemoglobin A1c

## 2023-01-27 NOTE — Addendum Note (Signed)
Addended by: Nani Gasser D on: 01/27/2023 09:27 AM   Modules accepted: Orders

## 2023-01-28 ENCOUNTER — Encounter: Payer: Self-pay | Admitting: Family Medicine

## 2023-01-28 LAB — LIPID PANEL
Chol/HDL Ratio: 5.5 ratio — ABNORMAL HIGH (ref 0.0–4.4)
Cholesterol, Total: 204 mg/dL — ABNORMAL HIGH (ref 100–199)
HDL: 37 mg/dL — ABNORMAL LOW (ref >39)
LDL Chol Calc (NIH): 129 mg/dL — ABNORMAL HIGH (ref 0–99)
Triglycerides: 214 mg/dL — ABNORMAL HIGH (ref 0–149)
VLDL Cholesterol Cal: 38 mg/dL (ref 5–40)

## 2023-01-28 LAB — HEMOGLOBIN A1C
Est. average glucose Bld gHb Est-mCnc: 120 mg/dL
Hgb A1c MFr Bld: 5.8 % — ABNORMAL HIGH (ref 4.8–5.6)

## 2023-01-28 LAB — BASIC METABOLIC PANEL
BUN/Creatinine Ratio: 8 — ABNORMAL LOW (ref 12–28)
BUN: 6 mg/dL — ABNORMAL LOW (ref 8–27)
CO2: 22 mmol/L (ref 20–29)
Calcium: 9.3 mg/dL (ref 8.7–10.3)
Chloride: 104 mmol/L (ref 96–106)
Creatinine, Ser: 0.76 mg/dL (ref 0.57–1.00)
Glucose: 98 mg/dL (ref 70–99)
Potassium: 4.3 mmol/L (ref 3.5–5.2)
Sodium: 141 mmol/L (ref 134–144)
eGFR: 82 mL/min/{1.73_m2} (ref >59)

## 2023-01-28 MED ORDER — ROSUVASTATIN CALCIUM 10 MG PO TABS: 10.0000 mg | ORAL_TABLET | Freq: Every day | ORAL | 3 refills | Status: DC

## 2023-01-28 NOTE — Progress Notes (Signed)
Hi Michelle Donovan,  LDL cholesterol is still a little elevated at 129, but much better than it was last time at 177..  Just encouraged her to continue to work on healthy diet and regular exercise.  In addition I would recommend that we consider increasing your Crestor to 10 mg.  Would you be willing to go up?    Kidney and liver function are stable.  A1c did go up slightly to 5.8 not as good as it was a year ago at 5.5.  Again continue to work on cutting back on sweets and carbs.  The 10-year ASCVD risk score (Arnett DK, et al., 2019) is: 30.8%   Values used to calculate the score:     Age: 77 years     Sex: Female     Is Non-Hispanic African American: No     Diabetic: No     Tobacco smoker: Yes     Systolic Blood Pressure: 132 mmHg     Is BP treated: Yes     HDL Cholesterol: 37 mg/dL     Total Cholesterol: 204 mg/dL

## 2023-01-28 NOTE — Progress Notes (Signed)
HI Sitlali,  Thank you for pointing out the error in the calculator.  All actually have to report that to our IT people we do have you listed as a former and not active smoker so I am baffled why it pulled that in to the equation.  I did recalculate you manually and your risk score is around 19% instead of 30%.  So still high enough that we need to start a statin but it does make me feel whole lot better about things.  So again thank you for catching that.   Orders Placed This Encounter     rosuvastatin (CRESTOR) 10 MG tablet         Sig: Take 1 tablet (10 mg total) by mouth at bedtime. Labs for refills         Dispense:  90 tablet         Refill:  3

## 2023-01-28 NOTE — Addendum Note (Signed)
Addended by: Nani Gasser D on: 01/28/2023 05:19 PM   Modules accepted: Orders

## 2023-01-30 ENCOUNTER — Other Ambulatory Visit: Payer: Self-pay | Admitting: Neurology

## 2023-01-30 DIAGNOSIS — G250 Essential tremor: Secondary | ICD-10-CM

## 2023-02-17 DIAGNOSIS — L570 Actinic keratosis: Secondary | ICD-10-CM | POA: Diagnosis not present

## 2023-02-17 DIAGNOSIS — L820 Inflamed seborrheic keratosis: Secondary | ICD-10-CM | POA: Diagnosis not present

## 2023-02-24 ENCOUNTER — Ambulatory Visit: Payer: No Typology Code available for payment source | Admitting: Neurology

## 2023-03-01 ENCOUNTER — Other Ambulatory Visit: Payer: Self-pay | Admitting: Family Medicine

## 2023-03-07 NOTE — Progress Notes (Unsigned)
Assessment/Plan:   1.  Essential Tremor  -For now she will continue the Zonegran, 100 mg, 2 capsules at night   -Continue propranolol, 60 mg twice per day  -She can continue primidone, 50 mg, 2 in the AM/2 at bed.  She didn't find higher dosages to be effective.  -she wanted to increase her klonopin and I told her I can increase to 1 po bid but not more than that.  She has taken extra klonopin recently and found it helpful but she wants more in the day, which I wasn't willing to do.  This most certainly frustrated her, but I told her I was not willing to prescribe more than what I currently do during the daytime, due to risk of falls and cognitive change in this age group.  -She knows that we will not be able to get adequate tremor control without surgical intervention, either focused ultrasound or DBS.  She does not want DBS therapy and does not wish to exit the [facility] network for focused ultrasound, at least not yet.  We discussed this again today.  -Discussed second opinions again as well.  Subjective:   Michelle Donovan was seen today in follow up for essential tremor.  My previous records were reviewed prior to todays visit.  She remains frustrated with tremor.  She is not ready to have surgery for tremor.  She is getting ready to have some sinus surgery on November 11.  She asks about increasing her klonopin  Current prescribed movement disorder medications: Clonazepam 0.5 mg, 1 tab in the morning, half tablet in the evening Primidone, 50 mg, 2 tablets twice per day (she was on 3/2 but didn't think that higher dose was helpful) Propranolol 60 mg twice per day Zonegran, 200 mg nightly   Current/Previously tried tremor medications: topamax (tried in 2015/16 with success but had hair loss and stopped; retried again up to 200 mg and had hair loss again and she did not think it was effective and we stopped it; retried again in 2021 at patient request and felt that it was more  effective although had hair loss);  Metoprolol - felt drained ; primidone (50 mg - 1/2 at night but got first dose effect and stopped it); meclizine (? Why); on propranolol now - ? Helping now; gabapentin; artane (no help); trihexyphenidyl 1 mg in the morning in the morning (higher dosages with side effects)  ALLERGIES:   Allergies  Allergen Reactions   Tylenol [Acetaminophen] Other (See Comments)    Increased liver enzymes   Codeine Nausea And Vomiting    CURRENT MEDICATIONS:  Outpatient Encounter Medications as of 03/10/2023  Medication Sig   Calcium Carb-Cholecalciferol (CALCIUM 1000 + D PO) Take 1 tablet by mouth daily. Doesn't take it everyday   cetirizine (ZYRTEC) 10 MG tablet Take 10 mg by mouth daily.   clonazePAM (KLONOPIN) 0.5 MG tablet 1 in AM, 1/2 q hs   ipratropium (ATROVENT) 0.06 % nasal spray Place 2 sprays into both nostrils 2 (two) times daily.   montelukast (SINGULAIR) 10 MG tablet TAKE 1 TABLET BY MOUTH EVERYDAY AT BEDTIME   Multiple Vitamin (MULTIVITAMIN) tablet Take 1 tablet by mouth daily.   nystatin (MYCOSTATIN) 100000 UNIT/ML suspension Take 5 mLs (500,000 Units total) by mouth 4 (four) times daily. X 7 days   primidone (MYSOLINE) 50 MG tablet TAKE 2 TABLETS BY MOUTH IN THE MORNING AND 2 AT NIGHT.   propranolol (INDERAL) 60 MG tablet TAKE 1 TABLET  BY MOUTH TWICE A DAY   rosuvastatin (CRESTOR) 10 MG tablet Take 1 tablet (10 mg total) by mouth at bedtime. Labs for refills   triamcinolone cream (KENALOG) 0.1 % SMARTSIG:Topical 1-2 Times Daily PRN   valACYclovir (VALTREX) 1000 MG tablet Take 2 tabs every 12 hours x 1 day as needed for cold sores   zonisamide (ZONEGRAN) 100 MG capsule TAKE 2 CAPSULES BY MOUTH EVERY DAY   No facility-administered encounter medications on file as of 03/10/2023.     Objective:    PHYSICAL EXAMINATION:    VITALS:   Vitals:   03/10/23 1240  BP: 130/70  Pulse: 68  SpO2: 98%  Weight: 139 lb 3.2 oz (63.1 kg)     Gen:  Appears  stated age and in NAD. HEENT:  Normocephalic, atraumatic. The mucous membranes are moist. The superficial temporal arteries are without ropiness or tenderness. Cardiovascular: Regular rate and rhythm. Lungs: Clear to auscultation bilaterally. Neck: There are no carotid bruits noted bilaterally.  NEUROLOGICAL:  Orientation:  The patient is alert and oriented x 3.   Cranial nerves: There is good facial symmetry.  Extraocular muscles are intact and visual fields are full to confrontational testing. Speech is fluent and clear.  Hearing is intact to conversational tone. Tone: Tone is good throughout. Sensation: Sensation is intact to light touch throughout. Coordination:  The patient has no difficulty with RAM's or FNF bilaterally.  There is no decremation with any form of RAMS, including alternating supination and pronation of the forearm, hand opening and closing, finger taps.   Motor: Strength is at least antigravity x4 Abnormal movements: No rest tremor.  There is no significant postural tremor.  Mild intention tremor.  She does have trouble with Archimedes spirals bilaterally.  The biggest trouble is getting the pen to the paper.      Chemistry      Component Value Date/Time   NA 141 01/27/2023 0946   K 4.3 01/27/2023 0946   CL 104 01/27/2023 0946   CO2 22 01/27/2023 0946   BUN 6 (L) 01/27/2023 0946   CREATININE 0.76 01/27/2023 0946   CREATININE 0.76 08/26/2022 1052      Component Value Date/Time   CALCIUM 9.3 01/27/2023 0946   ALKPHOS 101 09/17/2021 2055   AST 11 08/26/2022 1052   ALT 6 08/26/2022 1052   BILITOT 0.3 08/26/2022 1052      Lab Results  Component Value Date   WBC 6.3 08/26/2022   HGB 12.6 08/26/2022   HCT 39.1 08/26/2022   MCV 85.9 08/26/2022   PLT 221 08/26/2022   Lab Results  Component Value Date   TSH 0.65 12/24/2021     Chemistry      Component Value Date/Time   NA 141 01/27/2023 0946   K 4.3 01/27/2023 0946   CL 104 01/27/2023 0946   CO2 22  01/27/2023 0946   BUN 6 (L) 01/27/2023 0946   CREATININE 0.76 01/27/2023 0946   CREATININE 0.76 08/26/2022 1052      Component Value Date/Time   CALCIUM 9.3 01/27/2023 0946   ALKPHOS 101 09/17/2021 2055   AST 11 08/26/2022 1052   ALT 6 08/26/2022 1052   BILITOT 0.3 08/26/2022 1052     Total time spent on today's visit was 24 minutes, including both face-to-face time and nonface-to-face time.  Time included that spent on review of records (prior notes available to me/labs/imaging if pertinent), discussing treatment and goals, answering patient's questions and coordinating care.    Cc:  Agapito Games, MD

## 2023-03-10 ENCOUNTER — Ambulatory Visit: Payer: No Typology Code available for payment source | Admitting: Neurology

## 2023-03-10 ENCOUNTER — Telehealth: Payer: Self-pay | Admitting: Neurology

## 2023-03-10 ENCOUNTER — Encounter: Payer: Self-pay | Admitting: Neurology

## 2023-03-10 VITALS — BP 130/70 | HR 68 | Wt 139.2 lb

## 2023-03-10 DIAGNOSIS — G250 Essential tremor: Secondary | ICD-10-CM

## 2023-03-10 NOTE — Telephone Encounter (Signed)
Patient is needing a refill on the clonazepam 0.5mg  [facility] [address] [facility] [place].

## 2023-03-11 MED ORDER — CLONAZEPAM 0.5 MG PO TABS
ORAL_TABLET | ORAL | 5 refills | Status: DC
Start: 2023-03-11 — End: 2023-09-16

## 2023-03-16 DIAGNOSIS — Z8249 Family history of ischemic heart disease and other diseases of the circulatory system: Secondary | ICD-10-CM | POA: Diagnosis not present

## 2023-03-16 DIAGNOSIS — D62 Acute posthemorrhagic anemia: Secondary | ICD-10-CM | POA: Diagnosis not present

## 2023-03-16 DIAGNOSIS — S52611A Displaced fracture of right ulna styloid process, initial encounter for closed fracture: Secondary | ICD-10-CM | POA: Diagnosis not present

## 2023-03-16 DIAGNOSIS — Z9049 Acquired absence of other specified parts of digestive tract: Secondary | ICD-10-CM | POA: Diagnosis not present

## 2023-03-16 DIAGNOSIS — S0083XA Contusion of other part of head, initial encounter: Secondary | ICD-10-CM | POA: Diagnosis not present

## 2023-03-16 DIAGNOSIS — G250 Essential tremor: Secondary | ICD-10-CM | POA: Diagnosis not present

## 2023-03-16 DIAGNOSIS — J45909 Unspecified asthma, uncomplicated: Secondary | ICD-10-CM | POA: Diagnosis not present

## 2023-03-16 DIAGNOSIS — K219 Gastro-esophageal reflux disease without esophagitis: Secondary | ICD-10-CM | POA: Diagnosis not present

## 2023-03-16 DIAGNOSIS — S52571A Other intraarticular fracture of lower end of right radius, initial encounter for closed fracture: Secondary | ICD-10-CM | POA: Diagnosis not present

## 2023-03-16 DIAGNOSIS — I10 Essential (primary) hypertension: Secondary | ICD-10-CM | POA: Diagnosis not present

## 2023-03-16 DIAGNOSIS — M25559 Pain in unspecified hip: Secondary | ICD-10-CM | POA: Diagnosis not present

## 2023-03-16 DIAGNOSIS — S0990XA Unspecified injury of head, initial encounter: Secondary | ICD-10-CM | POA: Diagnosis not present

## 2023-03-16 DIAGNOSIS — Z79899 Other long term (current) drug therapy: Secondary | ICD-10-CM | POA: Diagnosis not present

## 2023-03-16 DIAGNOSIS — Z820 Family history of epilepsy and other diseases of the nervous system: Secondary | ICD-10-CM | POA: Diagnosis not present

## 2023-03-16 DIAGNOSIS — R079 Chest pain, unspecified: Secondary | ICD-10-CM | POA: Diagnosis not present

## 2023-03-16 DIAGNOSIS — S72144A Nondisplaced intertrochanteric fracture of right femur, initial encounter for closed fracture: Secondary | ICD-10-CM | POA: Diagnosis not present

## 2023-03-16 DIAGNOSIS — E785 Hyperlipidemia, unspecified: Secondary | ICD-10-CM | POA: Diagnosis not present

## 2023-03-16 DIAGNOSIS — Z90710 Acquired absence of both cervix and uterus: Secondary | ICD-10-CM | POA: Diagnosis not present

## 2023-03-16 DIAGNOSIS — F419 Anxiety disorder, unspecified: Secondary | ICD-10-CM | POA: Diagnosis not present

## 2023-03-16 DIAGNOSIS — Z886 Allergy status to analgesic agent status: Secondary | ICD-10-CM | POA: Diagnosis not present

## 2023-03-16 DIAGNOSIS — Z888 Allergy status to other drugs, medicaments and biological substances status: Secondary | ICD-10-CM | POA: Diagnosis not present

## 2023-03-16 DIAGNOSIS — S52591A Other fractures of lower end of right radius, initial encounter for closed fracture: Secondary | ICD-10-CM | POA: Diagnosis not present

## 2023-03-16 DIAGNOSIS — S72141A Displaced intertrochanteric fracture of right femur, initial encounter for closed fracture: Secondary | ICD-10-CM | POA: Diagnosis not present

## 2023-03-16 DIAGNOSIS — Z885 Allergy status to narcotic agent status: Secondary | ICD-10-CM | POA: Diagnosis not present

## 2023-03-16 DIAGNOSIS — Z87891 Personal history of nicotine dependence: Secondary | ICD-10-CM | POA: Diagnosis not present

## 2023-03-21 NOTE — Progress Notes (Signed)
Patient states she needs to reschedule 03/28/23 surgery and states she has spoken to [place] at Dr. Lucky Rathke office.

## 2023-03-22 NOTE — H&P (Addendum)
HPI:   Michelle Donovan is a 77 y.o. female who presents as a consult Patient.   Referring Provider: Charm Barges*  Chief complaint: Nasal congestion.  HPI: For several years she has been experiencing chronic nasal congestion, thick discharge anteriorly and postnasal drainage. She denies difficulty breathing through her nose. She denies nasal trauma. She has been treated in the past for sinus infections but it has been a long time. She was prescribed ipratropium which helped slightly but temporarily. She denies heartburn. She drinks 1 cup of coffee in the morning and has no other dietary risk factors for reflux.  PMH/Meds/All/SocHx/FamHx/ROS:   History reviewed. No pertinent past medical history.  History reviewed. No pertinent surgical history.  No family history of bleeding disorders, wound healing problems or difficulty with anesthesia.   Social History   Socioeconomic History   Marital status: Single  Spouse name: Not on file   Number of children: Not on file   Years of education: Not on file   Highest education level: Not on file  Occupational History   Not on file  Tobacco Use   Smoking status: Never   Smokeless tobacco: Never  Substance and Sexual Activity   Alcohol use: Not Currently   Drug use: Not on file   Sexual activity: Not on file  Other Topics Concern   Not on file  Social History Narrative   Not on file   Social Determinants of Health   Financial Resource Strain: Not on file  Food Insecurity: Not on file  Transportation Needs: Not on file  Physical Activity: Not on file  Stress: Not on file  Social Connections: Not on file  Housing Stability: Not on file   Current Outpatient Medications:   cetirizine (ZYRTEC) 10 MG tablet, Take 1 tablet (10 mg total) by mouth daily., Disp: , Rfl:   clonazePAM (KLONOPIN) 0.5 MG tablet, TAKE 1 TABLET BY MOUTH EVERY MORNING AND 1/2TAB AT BEDTIME, Disp: , Rfl:   ipratropium (ATROVENT) 42 mcg (0.06 %) nasal spray,  2 (TWO) SPRAY EACH NOSTRIL, FOUR TIMES DAILY, Disp: , Rfl:   montelukast (SINGULAIR) 10 mg tablet, Take 1 tablet (10 mg total) by mouth nightly., Disp: , Rfl:   multivitamin tablet, Take 1 tablet by mouth daily., Disp: , Rfl:   primidone (MYSOLINE) 50 MG tablet, TAKE 2 TABLETS BY MOUTH IN THE MORNING AND 2 AT NIGHT., Disp: , Rfl:   propranoloL (INDERAL) 60 MG tablet, Take 1 tablet (60 mg total) by mouth 2 times daily., Disp: , Rfl:   rosuvastatin (CRESTOR) 5 MG tablet, Take 1 tablet (5 mg total) by mouth., Disp: , Rfl:   zonisamide (ZONEGRAN) 100 MG capsule, Take 2 capsules (200 mg total) by mouth daily., Disp: , Rfl:   A complete ROS was performed with pertinent positives/negatives noted in the HPI. The remainder of the ROS are negative.   Physical Exam:   Temp 96.9 F (36.1 C)  Ht 1.651 m (5\' 5" )  Wt 65 kg (143 lb 6.4 oz)  BMI 23.86 kg/m   General: Healthy and alert, in no distress, breathing easily. Normal affect. In a pleasant mood. Head: Normocephalic, atraumatic. No masses, or scars. Eyes: Pupils are equal, and reactive to light. Vision is grossly intact. No spontaneous or gaze nystagmus. Ears: Ear canals are clear. Tympanic membranes are intact, with normal landmarks and the middle ears are clear and healthy. Hearing: Grossly normal. Nose: Nasal cavities are clear with healthy mucosa, no polyps or exudate. Airways are restricted  on the left due to a leftward septal deviation. Face: No masses or scars, facial nerve function is symmetric. Oral Cavity: No mucosal abnormalities are noted. Tongue with normal mobility. Dentition appears healthy. Oropharynx: There are no mucosal masses identified. Tongue base appears normal and healthy. Larynx/Hypopharynx: deferred Chest: Deferred Neck: No palpable masses, no cervical adenopathy, no thyroid nodules or enlargement. Neuro: Cranial nerves II-XII with normal function. Balance: Normal gate. Other findings: none.  Independent Review of  Additional Tests or Records:  none  Procedures:  none  Impression & Plans:  Chronic nasal drainage, possibly chronic sinusitis. Recommend a 10-day course of Augmentin and if she feels anything less then complete resolution of symptoms at the end I will have her come back here in about 2 weeks for CT imaging of the sinuses.We discussed the importance of eating active or live culture yoghurt 2-3 times daily while taking the antibiotics. This can help avoid GI side effects.

## 2023-03-23 DIAGNOSIS — E785 Hyperlipidemia, unspecified: Secondary | ICD-10-CM | POA: Diagnosis not present

## 2023-03-23 DIAGNOSIS — W108XXD Fall (on) (from) other stairs and steps, subsequent encounter: Secondary | ICD-10-CM | POA: Diagnosis not present

## 2023-03-23 DIAGNOSIS — I10 Essential (primary) hypertension: Secondary | ICD-10-CM | POA: Diagnosis not present

## 2023-03-23 DIAGNOSIS — S72141D Displaced intertrochanteric fracture of right femur, subsequent encounter for closed fracture with routine healing: Secondary | ICD-10-CM | POA: Diagnosis not present

## 2023-03-23 DIAGNOSIS — S52501A Unspecified fracture of the lower end of right radius, initial encounter for closed fracture: Secondary | ICD-10-CM | POA: Diagnosis not present

## 2023-03-23 DIAGNOSIS — Z743 Need for continuous supervision: Secondary | ICD-10-CM | POA: Diagnosis not present

## 2023-03-23 DIAGNOSIS — R278 Other lack of coordination: Secondary | ICD-10-CM | POA: Diagnosis not present

## 2023-03-23 DIAGNOSIS — W108XXA Fall (on) (from) other stairs and steps, initial encounter: Secondary | ICD-10-CM | POA: Diagnosis not present

## 2023-03-23 DIAGNOSIS — G250 Essential tremor: Secondary | ICD-10-CM | POA: Diagnosis not present

## 2023-03-23 DIAGNOSIS — M6281 Muscle weakness (generalized): Secondary | ICD-10-CM | POA: Diagnosis not present

## 2023-03-23 DIAGNOSIS — K219 Gastro-esophageal reflux disease without esophagitis: Secondary | ICD-10-CM | POA: Diagnosis not present

## 2023-03-23 DIAGNOSIS — Z4789 Encounter for other orthopedic aftercare: Secondary | ICD-10-CM | POA: Diagnosis not present

## 2023-03-23 DIAGNOSIS — R29898 Other symptoms and signs involving the musculoskeletal system: Secondary | ICD-10-CM | POA: Diagnosis not present

## 2023-03-23 DIAGNOSIS — S72114D Nondisplaced fracture of greater trochanter of right femur, subsequent encounter for closed fracture with routine healing: Secondary | ICD-10-CM | POA: Diagnosis not present

## 2023-03-23 DIAGNOSIS — S52611A Displaced fracture of right ulna styloid process, initial encounter for closed fracture: Secondary | ICD-10-CM | POA: Diagnosis not present

## 2023-03-23 DIAGNOSIS — J309 Allergic rhinitis, unspecified: Secondary | ICD-10-CM | POA: Diagnosis not present

## 2023-03-23 DIAGNOSIS — R293 Abnormal posture: Secondary | ICD-10-CM | POA: Diagnosis not present

## 2023-03-23 DIAGNOSIS — S6291XD Unspecified fracture of right wrist and hand, subsequent encounter for fracture with routine healing: Secondary | ICD-10-CM | POA: Diagnosis not present

## 2023-03-23 NOTE — Progress Notes (Addendum)
Message left for Michelle Donovan at Dr. Lucky Rathke office about rescheduling 03/28/23 surgery.

## 2023-03-25 ENCOUNTER — Encounter (HOSPITAL_BASED_OUTPATIENT_CLINIC_OR_DEPARTMENT_OTHER): Payer: Self-pay | Admitting: Anesthesiology

## 2023-03-25 DIAGNOSIS — S72141D Displaced intertrochanteric fracture of right femur, subsequent encounter for closed fracture with routine healing: Secondary | ICD-10-CM | POA: Diagnosis not present

## 2023-03-25 DIAGNOSIS — W108XXD Fall (on) (from) other stairs and steps, subsequent encounter: Secondary | ICD-10-CM | POA: Diagnosis not present

## 2023-03-25 DIAGNOSIS — S6291XD Unspecified fracture of right wrist and hand, subsequent encounter for fracture with routine healing: Secondary | ICD-10-CM | POA: Diagnosis not present

## 2023-03-25 DIAGNOSIS — J309 Allergic rhinitis, unspecified: Secondary | ICD-10-CM | POA: Diagnosis not present

## 2023-03-25 DIAGNOSIS — G250 Essential tremor: Secondary | ICD-10-CM | POA: Diagnosis not present

## 2023-03-25 DIAGNOSIS — E785 Hyperlipidemia, unspecified: Secondary | ICD-10-CM | POA: Diagnosis not present

## 2023-03-25 NOTE — Anesthesia Preprocedure Evaluation (Signed)
Anesthesia Evaluation    Reviewed: Allergy & Precautions, Patient's Chart, lab work & pertinent test results, reviewed documented beta blocker date and time   History of Anesthesia Complications (+) PONV and history of anesthetic complications  Airway        Dental   Pulmonary asthma , former smoker          Cardiovascular hypertension, Pt. on medications and Pt. on home beta blockers      Neuro/Psych  PSYCHIATRIC DISORDERS Anxiety     negative neurological ROS     GI/Hepatic Neg liver ROS,GERD  Controlled,,  Endo/Other  negative endocrine ROS    Renal/GU negative Renal ROS  negative genitourinary   Musculoskeletal  (+) Arthritis , Osteoarthritis,    Abdominal   Peds  Hematology negative hematology ROS (+)   Anesthesia Other Findings   Reproductive/Obstetrics negative OB ROS                             Anesthesia Physical Anesthesia Plan  ASA: 2  Anesthesia Plan: General   Post-op Pain Management: Tylenol PO (pre-op)*   Induction: Intravenous  PONV Risk Score and Plan: 4 or greater and Ondansetron, Dexamethasone and Treatment may vary due to age or medical condition  Airway Management Planned: Oral ETT  Additional Equipment: None  Intra-op Plan:   Post-operative Plan: Extubation in OR  Informed Consent:   Plan Discussed with:   Anesthesia Plan Comments:        Anesthesia Quick Evaluation

## 2023-03-28 ENCOUNTER — Ambulatory Visit (HOSPITAL_BASED_OUTPATIENT_CLINIC_OR_DEPARTMENT_OTHER)
Admission: RE | Admit: 2023-03-28 | Payer: No Typology Code available for payment source | Source: Home / Self Care | Admitting: Otolaryngology

## 2023-03-28 ENCOUNTER — Encounter (HOSPITAL_BASED_OUTPATIENT_CLINIC_OR_DEPARTMENT_OTHER): Admission: RE | Payer: Self-pay | Source: Home / Self Care

## 2023-03-28 SURGERY — SINUS SURGERY, ENDOSCOPIC
Anesthesia: General | Laterality: Left

## 2023-03-28 MED ORDER — FENTANYL CITRATE (PF) 100 MCG/2ML IJ SOLN
INTRAMUSCULAR | Status: AC
  Filled 2023-03-28: qty 2

## 2023-03-28 MED ORDER — LIDOCAINE 2% (20 MG/ML) 5 ML SYRINGE
INTRAMUSCULAR | Status: AC
  Filled 2023-03-28: qty 5

## 2023-03-28 MED ORDER — ONDANSETRON HCL 4 MG/2ML IJ SOLN
INTRAMUSCULAR | Status: AC
  Filled 2023-03-28: qty 2

## 2023-03-28 MED ORDER — PROPOFOL 10 MG/ML IV BOLUS
INTRAVENOUS | Status: AC
  Filled 2023-03-28: qty 20

## 2023-03-28 MED ORDER — DEXAMETHASONE SODIUM PHOSPHATE 10 MG/ML IJ SOLN
INTRAMUSCULAR | Status: AC
Start: 2023-03-28 — End: ?
  Filled 2023-03-28: qty 1

## 2023-03-29 ENCOUNTER — Telehealth: Payer: Self-pay | Admitting: Family Medicine

## 2023-03-29 NOTE — Telephone Encounter (Signed)
Received incoming voicemail from patients daughter Marchelle Folks). Daughter advised that call no longer needed. Rehab has taken care of patients request at this time.

## 2023-03-31 DIAGNOSIS — R52 Pain, unspecified: Secondary | ICD-10-CM | POA: Diagnosis not present

## 2023-03-31 DIAGNOSIS — R2681 Unsteadiness on feet: Secondary | ICD-10-CM | POA: Diagnosis not present

## 2023-03-31 DIAGNOSIS — W19XXXA Unspecified fall, initial encounter: Secondary | ICD-10-CM | POA: Diagnosis not present

## 2023-03-31 DIAGNOSIS — G250 Essential tremor: Secondary | ICD-10-CM | POA: Diagnosis not present

## 2023-04-01 ENCOUNTER — Other Ambulatory Visit: Payer: Self-pay | Admitting: Neurology

## 2023-04-01 DIAGNOSIS — G250 Essential tremor: Secondary | ICD-10-CM

## 2023-04-04 ENCOUNTER — Telehealth: Payer: Self-pay

## 2023-04-04 NOTE — Transitions of Care (Post Inpatient/ED Visit) (Unsigned)
   04/04/2023  Name: Michelle Donovan MRN: [id number] DOB: 1945-11-28  Today's TOC FU Call Status: Today's TOC FU Call Status:: Unsuccessful Call (1st Attempt) Unsuccessful Call (1st Attempt) Date: 04/04/23  Attempted to reach the patient regarding the most recent Inpatient/ED visit.  Follow Up Plan: Additional outreach attempts will be made to reach the patient to complete the Transitions of Care (Post Inpatient/ED visit) call.   Signature Karena Addison, LPN [facility] Nurse Health Advisor Direct Dial [phone]

## 2023-04-05 ENCOUNTER — Ambulatory Visit: Payer: No Typology Code available for payment source | Admitting: Family Medicine

## 2023-04-05 DIAGNOSIS — M25531 Pain in right wrist: Secondary | ICD-10-CM | POA: Diagnosis not present

## 2023-04-05 DIAGNOSIS — S72114A Nondisplaced fracture of greater trochanter of right femur, initial encounter for closed fracture: Secondary | ICD-10-CM | POA: Diagnosis not present

## 2023-04-05 NOTE — Transitions of Care (Post Inpatient/ED Visit) (Signed)
   04/05/2023  Name: Michelle Donovan MRN: [id number] DOB: [birth year]  Today's TOC FU Call Status: Today's TOC FU Call Status:: Unsuccessful Call (1st Attempt) Unsuccessful Call (1st Attempt) Date: 04/04/23  Attempted to reach the patient regarding the most recent Inpatient/ED visit.  Follow Up Plan: No further outreach attempts will be made at this time. We have been unable to contact the patient. Patient already seen in office Signature Karena Addison, LPN [facility] Nurse Health Advisor Direct Dial [phone]

## 2023-04-07 DIAGNOSIS — K219 Gastro-esophageal reflux disease without esophagitis: Secondary | ICD-10-CM | POA: Diagnosis not present

## 2023-04-07 DIAGNOSIS — S52571D Other intraarticular fracture of lower end of right radius, subsequent encounter for closed fracture with routine healing: Secondary | ICD-10-CM | POA: Diagnosis not present

## 2023-04-07 DIAGNOSIS — K589 Irritable bowel syndrome without diarrhea: Secondary | ICD-10-CM | POA: Diagnosis not present

## 2023-04-07 DIAGNOSIS — G250 Essential tremor: Secondary | ICD-10-CM | POA: Diagnosis not present

## 2023-04-07 DIAGNOSIS — E785 Hyperlipidemia, unspecified: Secondary | ICD-10-CM | POA: Diagnosis not present

## 2023-04-07 DIAGNOSIS — S72141D Displaced intertrochanteric fracture of right femur, subsequent encounter for closed fracture with routine healing: Secondary | ICD-10-CM | POA: Diagnosis not present

## 2023-04-07 DIAGNOSIS — Z87891 Personal history of nicotine dependence: Secondary | ICD-10-CM | POA: Diagnosis not present

## 2023-04-07 DIAGNOSIS — Z9181 History of falling: Secondary | ICD-10-CM | POA: Diagnosis not present

## 2023-04-07 DIAGNOSIS — I10 Essential (primary) hypertension: Secondary | ICD-10-CM | POA: Diagnosis not present

## 2023-04-07 DIAGNOSIS — J309 Allergic rhinitis, unspecified: Secondary | ICD-10-CM | POA: Diagnosis not present

## 2023-04-12 DIAGNOSIS — S72141D Displaced intertrochanteric fracture of right femur, subsequent encounter for closed fracture with routine healing: Secondary | ICD-10-CM | POA: Diagnosis not present

## 2023-04-12 DIAGNOSIS — G250 Essential tremor: Secondary | ICD-10-CM | POA: Diagnosis not present

## 2023-04-12 DIAGNOSIS — I10 Essential (primary) hypertension: Secondary | ICD-10-CM | POA: Diagnosis not present

## 2023-04-12 DIAGNOSIS — K219 Gastro-esophageal reflux disease without esophagitis: Secondary | ICD-10-CM | POA: Diagnosis not present

## 2023-04-12 DIAGNOSIS — Z9181 History of falling: Secondary | ICD-10-CM | POA: Diagnosis not present

## 2023-04-12 DIAGNOSIS — J309 Allergic rhinitis, unspecified: Secondary | ICD-10-CM | POA: Diagnosis not present

## 2023-04-12 DIAGNOSIS — S52571D Other intraarticular fracture of lower end of right radius, subsequent encounter for closed fracture with routine healing: Secondary | ICD-10-CM | POA: Diagnosis not present

## 2023-04-12 DIAGNOSIS — K589 Irritable bowel syndrome without diarrhea: Secondary | ICD-10-CM | POA: Diagnosis not present

## 2023-04-12 DIAGNOSIS — Z87891 Personal history of nicotine dependence: Secondary | ICD-10-CM | POA: Diagnosis not present

## 2023-04-12 DIAGNOSIS — E785 Hyperlipidemia, unspecified: Secondary | ICD-10-CM | POA: Diagnosis not present

## 2023-04-18 DIAGNOSIS — E785 Hyperlipidemia, unspecified: Secondary | ICD-10-CM | POA: Diagnosis not present

## 2023-04-18 DIAGNOSIS — K589 Irritable bowel syndrome without diarrhea: Secondary | ICD-10-CM | POA: Diagnosis not present

## 2023-04-18 DIAGNOSIS — Z9181 History of falling: Secondary | ICD-10-CM | POA: Diagnosis not present

## 2023-04-18 DIAGNOSIS — I10 Essential (primary) hypertension: Secondary | ICD-10-CM | POA: Diagnosis not present

## 2023-04-18 DIAGNOSIS — J309 Allergic rhinitis, unspecified: Secondary | ICD-10-CM | POA: Diagnosis not present

## 2023-04-18 DIAGNOSIS — Z87891 Personal history of nicotine dependence: Secondary | ICD-10-CM | POA: Diagnosis not present

## 2023-04-18 DIAGNOSIS — S52571D Other intraarticular fracture of lower end of right radius, subsequent encounter for closed fracture with routine healing: Secondary | ICD-10-CM | POA: Diagnosis not present

## 2023-04-18 DIAGNOSIS — S72141D Displaced intertrochanteric fracture of right femur, subsequent encounter for closed fracture with routine healing: Secondary | ICD-10-CM | POA: Diagnosis not present

## 2023-04-18 DIAGNOSIS — K219 Gastro-esophageal reflux disease without esophagitis: Secondary | ICD-10-CM | POA: Diagnosis not present

## 2023-04-18 DIAGNOSIS — G250 Essential tremor: Secondary | ICD-10-CM | POA: Diagnosis not present

## 2023-04-19 DIAGNOSIS — M25531 Pain in right wrist: Secondary | ICD-10-CM | POA: Diagnosis not present

## 2023-04-20 DIAGNOSIS — K589 Irritable bowel syndrome without diarrhea: Secondary | ICD-10-CM | POA: Diagnosis not present

## 2023-04-20 DIAGNOSIS — J309 Allergic rhinitis, unspecified: Secondary | ICD-10-CM | POA: Diagnosis not present

## 2023-04-20 DIAGNOSIS — Z87891 Personal history of nicotine dependence: Secondary | ICD-10-CM | POA: Diagnosis not present

## 2023-04-20 DIAGNOSIS — I10 Essential (primary) hypertension: Secondary | ICD-10-CM | POA: Diagnosis not present

## 2023-04-20 DIAGNOSIS — K219 Gastro-esophageal reflux disease without esophagitis: Secondary | ICD-10-CM | POA: Diagnosis not present

## 2023-04-20 DIAGNOSIS — S72141D Displaced intertrochanteric fracture of right femur, subsequent encounter for closed fracture with routine healing: Secondary | ICD-10-CM | POA: Diagnosis not present

## 2023-04-20 DIAGNOSIS — G250 Essential tremor: Secondary | ICD-10-CM | POA: Diagnosis not present

## 2023-04-20 DIAGNOSIS — S52571D Other intraarticular fracture of lower end of right radius, subsequent encounter for closed fracture with routine healing: Secondary | ICD-10-CM | POA: Diagnosis not present

## 2023-04-20 DIAGNOSIS — E785 Hyperlipidemia, unspecified: Secondary | ICD-10-CM | POA: Diagnosis not present

## 2023-04-20 DIAGNOSIS — Z9181 History of falling: Secondary | ICD-10-CM | POA: Diagnosis not present

## 2023-04-22 ENCOUNTER — Other Ambulatory Visit: Payer: Self-pay | Admitting: Family Medicine

## 2023-05-02 DIAGNOSIS — S72141D Displaced intertrochanteric fracture of right femur, subsequent encounter for closed fracture with routine healing: Secondary | ICD-10-CM | POA: Diagnosis not present

## 2023-05-02 DIAGNOSIS — I10 Essential (primary) hypertension: Secondary | ICD-10-CM | POA: Diagnosis not present

## 2023-05-02 DIAGNOSIS — Z87891 Personal history of nicotine dependence: Secondary | ICD-10-CM | POA: Diagnosis not present

## 2023-05-02 DIAGNOSIS — G250 Essential tremor: Secondary | ICD-10-CM | POA: Diagnosis not present

## 2023-05-02 DIAGNOSIS — K219 Gastro-esophageal reflux disease without esophagitis: Secondary | ICD-10-CM | POA: Diagnosis not present

## 2023-05-02 DIAGNOSIS — E785 Hyperlipidemia, unspecified: Secondary | ICD-10-CM | POA: Diagnosis not present

## 2023-05-02 DIAGNOSIS — Z9181 History of falling: Secondary | ICD-10-CM | POA: Diagnosis not present

## 2023-05-02 DIAGNOSIS — S52571D Other intraarticular fracture of lower end of right radius, subsequent encounter for closed fracture with routine healing: Secondary | ICD-10-CM | POA: Diagnosis not present

## 2023-05-02 DIAGNOSIS — K589 Irritable bowel syndrome without diarrhea: Secondary | ICD-10-CM | POA: Diagnosis not present

## 2023-05-02 DIAGNOSIS — J309 Allergic rhinitis, unspecified: Secondary | ICD-10-CM | POA: Diagnosis not present

## 2023-05-05 ENCOUNTER — Encounter: Payer: Self-pay | Admitting: Neurology

## 2023-05-09 DIAGNOSIS — K219 Gastro-esophageal reflux disease without esophagitis: Secondary | ICD-10-CM | POA: Diagnosis not present

## 2023-05-09 DIAGNOSIS — Z87891 Personal history of nicotine dependence: Secondary | ICD-10-CM | POA: Diagnosis not present

## 2023-05-09 DIAGNOSIS — I10 Essential (primary) hypertension: Secondary | ICD-10-CM | POA: Diagnosis not present

## 2023-05-09 DIAGNOSIS — Z9181 History of falling: Secondary | ICD-10-CM | POA: Diagnosis not present

## 2023-05-09 DIAGNOSIS — G250 Essential tremor: Secondary | ICD-10-CM | POA: Diagnosis not present

## 2023-05-09 DIAGNOSIS — J309 Allergic rhinitis, unspecified: Secondary | ICD-10-CM | POA: Diagnosis not present

## 2023-05-09 DIAGNOSIS — K589 Irritable bowel syndrome without diarrhea: Secondary | ICD-10-CM | POA: Diagnosis not present

## 2023-05-09 DIAGNOSIS — S72141D Displaced intertrochanteric fracture of right femur, subsequent encounter for closed fracture with routine healing: Secondary | ICD-10-CM | POA: Diagnosis not present

## 2023-05-09 DIAGNOSIS — S52571D Other intraarticular fracture of lower end of right radius, subsequent encounter for closed fracture with routine healing: Secondary | ICD-10-CM | POA: Diagnosis not present

## 2023-05-09 DIAGNOSIS — E785 Hyperlipidemia, unspecified: Secondary | ICD-10-CM | POA: Diagnosis not present

## 2023-05-16 DIAGNOSIS — M25531 Pain in right wrist: Secondary | ICD-10-CM | POA: Diagnosis not present

## 2023-05-16 DIAGNOSIS — S72114A Nondisplaced fracture of greater trochanter of right femur, initial encounter for closed fracture: Secondary | ICD-10-CM | POA: Diagnosis not present

## 2023-06-07 ENCOUNTER — Ambulatory Visit: Payer: Self-pay | Admitting: Family Medicine

## 2023-06-07 DIAGNOSIS — G250 Essential tremor: Secondary | ICD-10-CM | POA: Diagnosis not present

## 2023-06-07 DIAGNOSIS — K219 Gastro-esophageal reflux disease without esophagitis: Secondary | ICD-10-CM | POA: Diagnosis not present

## 2023-06-07 DIAGNOSIS — E785 Hyperlipidemia, unspecified: Secondary | ICD-10-CM | POA: Diagnosis not present

## 2023-06-07 DIAGNOSIS — K7290 Hepatic failure, unspecified without coma: Secondary | ICD-10-CM | POA: Diagnosis not present

## 2023-06-07 DIAGNOSIS — Z87891 Personal history of nicotine dependence: Secondary | ICD-10-CM | POA: Diagnosis not present

## 2023-06-07 DIAGNOSIS — Z9181 History of falling: Secondary | ICD-10-CM | POA: Diagnosis not present

## 2023-06-07 DIAGNOSIS — S72141D Displaced intertrochanteric fracture of right femur, subsequent encounter for closed fracture with routine healing: Secondary | ICD-10-CM | POA: Diagnosis not present

## 2023-06-07 DIAGNOSIS — Z556 Problems related to health literacy: Secondary | ICD-10-CM | POA: Diagnosis not present

## 2023-06-07 DIAGNOSIS — S52571D Other intraarticular fracture of lower end of right radius, subsequent encounter for closed fracture with routine healing: Secondary | ICD-10-CM | POA: Diagnosis not present

## 2023-06-07 DIAGNOSIS — I10 Essential (primary) hypertension: Secondary | ICD-10-CM | POA: Diagnosis not present

## 2023-06-07 DIAGNOSIS — S72114D Nondisplaced fracture of greater trochanter of right femur, subsequent encounter for closed fracture with routine healing: Secondary | ICD-10-CM | POA: Diagnosis not present

## 2023-06-07 NOTE — Telephone Encounter (Signed)
Chief Complaint: leg/foot swelling Symptoms: severe right leg and bilateral foot swelling Frequency: right leg swelling present since right hip frx last October, PT came to pt's house today and noticed severe feet swelling Pertinent Negatives: Patient denies CP, SOB, fever, pain in the legs, redness, drainage, skin color change  Disposition: [] ED /[] Urgent Care (no appt availability in office) / [x] Appointment(In office/virtual)/ []  [place] [facility]/ [] Home Care/ [x] Refused Recommended Disposition /[] [place] [facility]/ []  Follow-up with PCP Additional Notes: Pt reports severe right leg swelling and bilateral feet swelling up both legs. Pt states she fractured her right hip last October. PT came to pt's house today and advised pt be seen for severe swelling. Pt denies CP, SOB, fever, skin color changes, pain. Per protocol, this RN tried to schedule pt an appt within 4 hours today. Pt stated she wants to be seen by Dr. Linford Arnold who does not have availability today. Pt also states she would not have transport today. Pt advised this RN would send a message to the appropriate person for follow-up. No appt scheduled due to refusal to follow protocol. Pt advised to call back for worsening symptoms.  Copied from CRM [id number]. Topic: Clinical - Red Word Triage >> Jun 07, 2023 11:19 AM Ivette P wrote: [facility] that prompted transfer to Nurse Triage: Swelling, Pt was seen by physical therapist in home this morning and had swelling in both legs. Was advised to call Reason for Disposition  SEVERE leg swelling (e.g., swelling extends above knee, entire leg is swollen, weeping fluid)  Answer Assessment - Initial Assessment Questions 1. ONSET: "When did the swelling start?" (e.g., minutes, hours, days)     Swelling "for a while" after a fall and hip frx last October  2. LOCATION: "What part of the leg is swollen?"  "Are both legs swollen or just one leg?"     Started with right leg (hip frx last  October), and now bilateral feet 3. SEVERITY: "How bad is the swelling?" (e.g., localized; mild, moderate, severe)   - Localized: Small area of swelling localized to one leg.   - MILD pedal edema: Swelling limited to foot and ankle, pitting edema < 1/4 inch (6 mm) deep, rest and elevation eliminate most or all swelling.   - MODERATE edema: Swelling of lower leg to knee, pitting edema > 1/4 inch (6 mm) deep, rest and elevation only partially reduce swelling.   - SEVERE edema: Swelling extends above knee, facial or hand swelling present.      Severe - swelling goes down some at night, but not completely. As soon as I'm up and about, it swells back up 4. REDNESS: "Does the swelling look red or infected?"     No 5. PAIN: "Is the swelling painful to touch?" If Yes, ask: "How painful is it?"   (Scale 1-10; mild, moderate or severe)     No 6. FEVER: "Do you have a fever?" If Yes, ask: "What is it, how was it measured, and when did it start?"     No 7. CAUSE: "What do you think is causing the leg swelling?"      8. MEDICAL HISTORY: "Do you have a history of blood clots (e.g., DVT), cancer, heart failure, kidney disease, or liver failure?"     No - "my main problems are tremors and fatty liver disease" 9. RECURRENT SYMPTOM: "Have you had leg swelling before?" If Yes, ask: "When was the last time?" "What happened that time?"  I have had swelling before in the left leg, "I don't remember the exact wording, they did a test - I don't remember, some vascular thing years ago", swelling in right leg present after fall last October 10. OTHER SYMPTOMS: "Do you have any other symptoms?" (e.g., chest pain, difficulty breathing)       None  Protocols used: Leg Swelling and Edema-A-AH

## 2023-06-09 NOTE — Telephone Encounter (Signed)
Patient scheduled 06/10/23 with dr. Linford Arnold.

## 2023-06-09 NOTE — Telephone Encounter (Signed)
Attempted call to patient to help her in scheduling an office visit for swelling .  Left a voice mail message requesting a return call.

## 2023-06-10 ENCOUNTER — Encounter: Payer: Self-pay | Admitting: Family Medicine

## 2023-06-10 ENCOUNTER — Ambulatory Visit (INDEPENDENT_AMBULATORY_CARE_PROVIDER_SITE_OTHER): Payer: PPO | Admitting: Family Medicine

## 2023-06-10 VITALS — BP 143/55 | HR 63

## 2023-06-10 DIAGNOSIS — R2243 Localized swelling, mass and lump, lower limb, bilateral: Secondary | ICD-10-CM

## 2023-06-10 DIAGNOSIS — S72141D Displaced intertrochanteric fracture of right femur, subsequent encounter for closed fracture with routine healing: Secondary | ICD-10-CM

## 2023-06-10 DIAGNOSIS — D649 Anemia, unspecified: Secondary | ICD-10-CM

## 2023-06-10 NOTE — Assessment & Plan Note (Signed)
We discussed getting an up-to-date DEXA because she had a low impact hip fracture we will go ahead and get that ordered today but she will have to get transportation back she is not currently driving she is gena start her second round of home PT.

## 2023-06-10 NOTE — Progress Notes (Signed)
   Acute Office Visit  Subjective:     Patient ID: Michelle Donovan, female    DOB: 1946-02-18, 78 y.o.   MRN: [id number]  Chief Complaint  Patient presents with   Joint Swelling   Foot Swelling    HPI Patient is in today for bilat mid lower leg swelling to her feet. Right started in right leg in October when she fractured her hip and then left started swelling in November.  Was getting home PT She is getting some pressure areas on her heels.   No CP or SOB.    Recent changes to diet.  No increases in salt.  She has not changed anything recently.  Has not been taking any NSAIDs mostly relying on Tylenol for pain control after her hip fracture.   ROS      Objective:    BP (!) 143/55   Pulse 63   SpO2 100%    Physical Exam Vitals and nursing note reviewed.  Constitutional:      Appearance: Normal appearance.  HENT:     Head: Normocephalic and atraumatic.  Eyes:     Conjunctiva/sclera: Conjunctivae normal.  Cardiovascular:     Rate and Rhythm: Normal rate and regular rhythm.  Pulmonary:     Effort: Pulmonary effort is normal.     Breath sounds: Normal breath sounds.  Skin:    General: Skin is warm and dry.  Neurological:     Mental Status: She is alert.  Psychiatric:        Mood and Affect: Mood normal.     No results found for any visits on 06/10/23.      Assessment & Plan:   Problem List Items Addressed This Visit       Musculoskeletal and Integument   Displaced intertrochanteric fracture of right femur, subsequent encounter for closed fracture with routine healing   We discussed getting an up-to-date DEXA because she had a low impact hip fracture we will go ahead and get that ordered today but she will have to get transportation back she is not currently driving she is gena start her second round of home PT.      Relevant Orders   DG Bone Density   Other Visit Diagnoses       Localized swelling of both lower legs    -  Primary   Relevant  Orders   CMP14+EGFR   TSH   CBC with Differential/Platelet   Urine Microalbumin w/creat. ratio   Urinalysis, Routine w reflex microscopic   B Nat Peptide     Anemia, unspecified type       Relevant Orders   CMP14+EGFR   TSH   CBC with Differential/Platelet   Urine Microalbumin w/creat. ratio   Urinalysis, Routine w reflex microscopic   B Nat Peptide       Lower extremity swelling-discussed checking for electrolyte abnormalities, thyroid dysfunction, protein loss, heart failure etc.  Also consider venous stasis.  She might benefit come from compression stockings but we will start by checking labs.  She did have quite severe anemia after the fracture with a hemoglobin of 7.9 so I definitely want take a look at that again and make sure that it is improving that could be contributing to her swelling as well.  No orders of the defined types were placed in this encounter.   Return in about 1 month (around 07/11/2023) for swelling and BP .  Nani Gasser, MD

## 2023-06-11 LAB — CBC WITH DIFFERENTIAL/PLATELET
Basophils Absolute: 0 10*3/uL (ref 0.0–0.2)
Basos: 0 %
EOS (ABSOLUTE): 0.1 10*3/uL (ref 0.0–0.4)
Eos: 1 %
Hematocrit: 35.1 % (ref 34.0–46.6)
Hemoglobin: 11.1 g/dL (ref 11.1–15.9)
Immature Grans (Abs): 0 10*3/uL (ref 0.0–0.1)
Immature Granulocytes: 0 %
Lymphocytes Absolute: 2 10*3/uL (ref 0.7–3.1)
Lymphs: 22 %
MCH: 28.1 pg (ref 26.6–33.0)
MCHC: 31.6 g/dL (ref 31.5–35.7)
MCV: 89 fL (ref 79–97)
Monocytes Absolute: 0.4 10*3/uL (ref 0.1–0.9)
Monocytes: 5 %
Neutrophils Absolute: 6.4 10*3/uL (ref 1.4–7.0)
Neutrophils: 72 %
Platelets: 202 10*3/uL (ref 150–450)
RBC: 3.95 x10E6/uL (ref 3.77–5.28)
RDW: 13.9 % (ref 11.7–15.4)
WBC: 9 10*3/uL (ref 3.4–10.8)

## 2023-06-11 LAB — CMP14+EGFR
ALT: 6 [IU]/L (ref 0–32)
AST: 10 [IU]/L (ref 0–40)
Albumin: 3.8 g/dL (ref 3.8–4.8)
Alkaline Phosphatase: 152 [IU]/L — ABNORMAL HIGH (ref 44–121)
BUN/Creatinine Ratio: 15 (ref 12–28)
BUN: 10 mg/dL (ref 8–27)
Bilirubin Total: <0.2 mg/dL (ref 0.0–1.2)
CO2: 24 mmol/L (ref 20–29)
Calcium: 9.4 mg/dL (ref 8.7–10.3)
Chloride: 105 mmol/L (ref 96–106)
Creatinine, Ser: 0.67 mg/dL (ref 0.57–1.00)
Globulin, Total: 2.8 g/dL (ref 1.5–4.5)
Glucose: 104 mg/dL — ABNORMAL HIGH (ref 70–99)
Potassium: 3.4 mmol/L — ABNORMAL LOW (ref 3.5–5.2)
Sodium: 142 mmol/L (ref 134–144)
Total Protein: 6.6 g/dL (ref 6.0–8.5)
eGFR: 91 mL/min/{1.73_m2} (ref >59)

## 2023-06-11 LAB — URINALYSIS, ROUTINE W REFLEX MICROSCOPIC
Bilirubin, UA: NEGATIVE
Glucose, UA: NEGATIVE
Ketones, UA: NEGATIVE
Leukocytes,UA: NEGATIVE
Nitrite, UA: NEGATIVE
RBC, UA: NEGATIVE
Specific Gravity, UA: 1.027 (ref 1.005–1.030)
Urobilinogen, Ur: 0.2 mg/dL (ref 0.2–1.0)
pH, UA: 5.5 (ref 5.0–7.5)

## 2023-06-11 LAB — MICROALBUMIN / CREATININE URINE RATIO
Creatinine, Urine: 117.7 mg/dL
Microalb/Creat Ratio: 17 mg/g{creat} (ref 0–29)
Microalbumin, Urine: 20.5 ug/mL

## 2023-06-11 LAB — TSH: TSH: 0.853 u[IU]/mL (ref 0.450–4.500)

## 2023-06-11 LAB — BRAIN NATRIURETIC PEPTIDE: BNP: 75.1 pg/mL (ref 0.0–100.0)

## 2023-06-13 ENCOUNTER — Encounter: Payer: Self-pay | Admitting: Family Medicine

## 2023-06-13 NOTE — Progress Notes (Signed)
Hi Michelle Donovan, potassium was actually a little on the low side.  So I do want a keep an eye on that and recheck again in about 3  weeks.  The AST and ALT liver enzymes were normal but the alkaline phosphatase was elevated slightly.  I want a recheck that in 3  weeks.  Thyroid looks great.  Blood count is normal no sign of anemia.  Urine sample looks great.  No sign of heart failure.  No excess protein in the urine.  I would recommend compression socks at this point to help keep some of the fluid under better control in addition we could consider putting you on a low-dose diuretic to help with the fluid and your blood pressure.  If you are okay with that then please let me know and I will send in a prescription to the pharmacy and then again we would still need to recheck your labs in about 3 weeks.

## 2023-06-15 ENCOUNTER — Other Ambulatory Visit: Payer: Self-pay | Admitting: Family Medicine

## 2023-06-15 ENCOUNTER — Other Ambulatory Visit: Payer: Self-pay | Admitting: Neurology

## 2023-06-15 DIAGNOSIS — G250 Essential tremor: Secondary | ICD-10-CM

## 2023-06-17 ENCOUNTER — Other Ambulatory Visit: Payer: Self-pay

## 2023-06-17 ENCOUNTER — Telehealth: Payer: Self-pay

## 2023-06-17 DIAGNOSIS — I10 Essential (primary) hypertension: Secondary | ICD-10-CM

## 2023-06-17 MED ORDER — HYDROCHLOROTHIAZIDE 12.5 MG PO CAPS: 12.5000 mg | ORAL_CAPSULE | Freq: Every day | ORAL | 1 refills | Status: DC

## 2023-06-17 NOTE — Telephone Encounter (Signed)
Regarding lab work results as below:  Hi Jennfier, potassium was actually a little on the low side.  So I do want a keep an eye on that and recheck again in about 3  weeks.  The AST and ALT liver enzymes were normal but the alkaline phosphatase was elevated slightly.  I want a recheck that in 3  weeks.  Thyroid looks great.  Blood count is normal no sign of anemia.  Urine sample looks great.  No sign of heart failure.  No excess protein in the urine.  I would recommend compression socks at this point to help keep some of the fluid under better control in addition we could consider putting you on a low-dose diuretic to help with the fluid and your blood pressure.  If you are okay with that then please let me know and I will send in a prescription to the pharmacy and then again we would still need to recheck your labs in about 3 weeks.   Patient was informed of need to  come back for lab work in 3 weeks  and she is  agreeable to starting the low dose diuretic.   She wanted to let Dr. Linford Arnold know that she feels her BP elevation in office was due to stressors she was experiencing  at the time. She states she does not take BP medication for her BP  but for her tremors and she does not normally have elevated BP readings that high.

## 2023-06-17 NOTE — Telephone Encounter (Signed)
Copied from CRM [id number]. Topic: Clinical - Request for Lab/Test Order >> Jun 15, 2023  3:40 PM [name] wrote: Reason for CRM: Patient calling for clarity on instructions paired with lab results. Informed patient that, per PCP's message, appears she needs to follow up in three weeks for labs and not necessarily an appointment. Patient requesting future labs be placed or a call be given back to her if she needs an appointment.

## 2023-06-17 NOTE — Telephone Encounter (Signed)
Meds ordered this encounter  ?Medications  ? hydrochlorothiazide (MICROZIDE) 12.5 MG capsule  ?  Sig: Take 1 capsule (12.5 mg total) by mouth daily.  ?  Dispense:  30 capsule  ?  Refill:  1  ? ? ?

## 2023-06-17 NOTE — Telephone Encounter (Signed)
 Attempted call to patient. Left a voice mail message requesting a return call.

## 2023-07-11 ENCOUNTER — Telehealth: Payer: Self-pay

## 2023-07-11 ENCOUNTER — Ambulatory Visit: Payer: PPO | Admitting: Family Medicine

## 2023-07-11 ENCOUNTER — Other Ambulatory Visit: Payer: Self-pay | Admitting: Family Medicine

## 2023-07-11 NOTE — Telephone Encounter (Signed)
 Copied from CRM [id number]. Topic: Clinical - Home Health Verbal Orders >> Jul 11, 2023 12:24 PM [name] wrote: Caller/Agency: Rachel Bo Callback Number: [phone] No request for orders at this time. Calling to report patient concerns.  Any new concerns about the patient? Yes Having issues with bowel movements. Has not been taking Miralax daily but has been ten or eleven days since she has had a bowel movement. Is not new but ongoing and patient is also losing weight. States there is a film in her mouth that leaves a foul taste, causing her to stop eating. Overall complexion is pale and she is very discouraged and depressed due to health issues.

## 2023-07-12 ENCOUNTER — Ambulatory Visit: Payer: Self-pay | Admitting: Family Medicine

## 2023-07-12 NOTE — Telephone Encounter (Signed)
 Copied from CRM [id number]. Topic: Clinical - Red Word Triage >> Jul 12, 2023 10:16 AM Shelah Lewandowsky wrote: [facility] Word that prompted transfer to Nurse Triage: Patient has been constipated for over a week, now has hemorrhoids  Chief Complaint: constipation Symptoms: no BM x 2 weeks, abd bloating started today, hemorrhoids Frequency: x 2 weeks Pertinent Negatives: Patient denies fever, no abd pain Disposition: [] ED /[] Urgent Care (no appt availability in office) / [] Appointment(In office/virtual)/ []  [place] [facility]/ [] Home Care/ [x] Refused Recommended Disposition /[] [place] [facility]/ []  Follow-up with PCP Additional Notes: pt states no appetite: the thought of food or drink/coffee states her feel nauseous.  Pt stated she has lost weight due to not eating. Pt is recovering from broken Right hip & right wrist therefore not as active as normal.  Pt has taken stool softeners & miralax but not on a regular basis to help but does not like the miralax therefore does not take as should. Pt refused to make appt in office due to no transportation: refused video appt: stated she wanted home care advice only.  Nurse encouraged pt if no relief to call back.   Reason for Disposition  [1] Uses laxative (e.g., PEG / Miralax, Milk of Magnesia) or enema AND [2] more than once a month  [1] Constipation persists > 1 week AND [2] no improvement after using Care Advice  Answer Assessment - Initial Assessment Questions 1. STOOL PATTERN OR FREQUENCY: "How often do you have a bowel movement (BM)?"  (Normal range: 3 times a day to every 3 days)  "When was your last BM?"       07/02/2023 loose in beginning then normal form at end - had taken OTC laxative 2. STRAINING: "Do you have to strain to have a BM?"      No - currently have urge to BM but no CM 3. RECTAL PAIN: "Does your rectum hurt when the stool comes out?" If Yes, ask: "Do you have hemorrhoids? How bad is the pain?"  (Scale 1-10; or mild, moderate, severe)      Hemorrhoids present without bleeding 4. STOOL COMPOSITION: "Are the stools hard?"      Yes when having stools 5. BLOOD ON STOOLS: "Has there been any blood on the toilet tissue or on the surface of the BM?" If Yes, ask: "When was the last time?"     no 6. CHRONIC CONSTIPATION: "Is this a new problem for you?"  If No, ask: "How long have you had this problem?" (days, weeks, months)      no 7. CHANGES IN DIET OR HYDRATION: "Have there been any recent changes in your diet?" "How much fluids are you drinking on a daily basis?"  "How much have you had to drink today?"     Off and on due to IBS 8. MEDICINES: "Have you been taking any new medicines?" "Are you taking any narcotic pain medicines?" (e.g., Dilaudid, morphine, Percocet, Vicodin)     no 9. LAXATIVES: "Have you been using any stool softeners, laxatives, or enemas?"  If Yes, ask "What, how often, and when was the last time?"     Stool softeners and miralax  10. ACTIVITY:  "How much walking do you do every day?"  "Has your activity level decreased in the past week?"        N/a 11. CAUSE: "What do you think is causing the constipation?"        unknown 12. OTHER SYMPTOMS: "Do you have any other symptoms?" (  e.g., abdomen pain, bloating, fever, vomiting)       Abd bloating, lost a lot of weight due to not eating, 13. MEDICAL HISTORY: "Do you have a history of hemorrhoids, rectal fissures, or rectal surgery or rectal abscess?"         hemorrhoids 14. PREGNANCY: "Is there any chance you are pregnant?" "When was your last menstrual period?"       N/a  Protocols used: Constipation-A-AH

## 2023-07-12 NOTE — Telephone Encounter (Signed)
 I would recommend increasing MiraLAX to twice a day until she has a soft bowel movement especially if she has not had a BM over the last 10 days.  Even though she does not feel like eating a lot try to increase fluids.  We can always schedule her an appointment.  Does she have a GI that she sees?

## 2023-07-14 NOTE — Telephone Encounter (Signed)
 Patient states she has passed normal stool and is feeling much better. Advise not needed per pt.

## 2023-07-14 NOTE — Telephone Encounter (Signed)
 Patient states she has passed normal stool and is feeling much better. Advise not needed per pt.       Note   Agapito Games, MD routed conversation to [facility] days ago   Agapito Games, MD2 days ago    I would recommend increasing MiraLAX to twice a day until she has a soft bowel movement especially if she has not had a BM over the last 10 days.  Even though she does not feel like eating a lot try to increase fluids.  We can always schedule her an appointment.  Does she have a GI that she sees?

## 2023-07-21 ENCOUNTER — Other Ambulatory Visit: Payer: Self-pay | Admitting: Neurology

## 2023-07-21 DIAGNOSIS — G250 Essential tremor: Secondary | ICD-10-CM

## 2023-07-23 DIAGNOSIS — Z9049 Acquired absence of other specified parts of digestive tract: Secondary | ICD-10-CM | POA: Diagnosis not present

## 2023-07-23 DIAGNOSIS — R109 Unspecified abdominal pain: Secondary | ICD-10-CM | POA: Diagnosis not present

## 2023-07-23 DIAGNOSIS — R630 Anorexia: Secondary | ICD-10-CM | POA: Diagnosis not present

## 2023-07-23 DIAGNOSIS — Z87891 Personal history of nicotine dependence: Secondary | ICD-10-CM | POA: Diagnosis not present

## 2023-07-23 DIAGNOSIS — K5909 Other constipation: Secondary | ICD-10-CM | POA: Diagnosis not present

## 2023-07-23 DIAGNOSIS — R251 Tremor, unspecified: Secondary | ICD-10-CM | POA: Diagnosis not present

## 2023-07-23 DIAGNOSIS — K219 Gastro-esophageal reflux disease without esophagitis: Secondary | ICD-10-CM | POA: Diagnosis not present

## 2023-07-23 DIAGNOSIS — K581 Irritable bowel syndrome with constipation: Secondary | ICD-10-CM | POA: Diagnosis not present

## 2023-07-25 ENCOUNTER — Telehealth: Payer: Self-pay | Admitting: Neurology

## 2023-07-25 NOTE — Telephone Encounter (Signed)
 Patient is having problems with going to the bathroom. She is having a problem with Constipation and was told in the ED that it could be coming from two of the medication she is on  please call  she wants to knows if there is something that we can do

## 2023-07-25 NOTE — Telephone Encounter (Signed)
 From Hospital notes  States she has urgency to go the restroom and then she can't get anything out. Hx of IBS and long running hx of GI issues. Hospital thinks Zonegran can also be the cause of constipation

## 2023-07-26 NOTE — Telephone Encounter (Signed)
 Spoke to patient regarding Her stopping the Zonegran and that she would like to try to see if she is able to eat without it tasting like sawdust

## 2023-07-27 ENCOUNTER — Ambulatory Visit: Payer: PPO

## 2023-07-27 ENCOUNTER — Other Ambulatory Visit: Payer: PPO

## 2023-07-27 NOTE — Telephone Encounter (Signed)
 Patient told me she has already stopped taking it for two days before this note

## 2023-08-19 DIAGNOSIS — L853 Xerosis cutis: Secondary | ICD-10-CM | POA: Diagnosis not present

## 2023-08-26 ENCOUNTER — Encounter: Payer: Self-pay | Admitting: Family Medicine

## 2023-08-26 ENCOUNTER — Ambulatory Visit (INDEPENDENT_AMBULATORY_CARE_PROVIDER_SITE_OTHER): Admitting: Family Medicine

## 2023-08-26 VITALS — BP 115/52 | HR 63 | Ht 65.0 in | Wt 114.0 lb

## 2023-08-26 DIAGNOSIS — R634 Abnormal weight loss: Secondary | ICD-10-CM | POA: Diagnosis not present

## 2023-08-26 DIAGNOSIS — R630 Anorexia: Secondary | ICD-10-CM | POA: Diagnosis not present

## 2023-08-26 NOTE — Progress Notes (Signed)
 Acute Office Visit  Subjective:     Patient ID: Michelle Donovan, female    DOB: 1945/12/30, 78 y.o.   MRN: [id number]  Chief Complaint  Patient presents with   Weight Loss   Depression    HPI Patient is in today for abnormal weight loss.  She has been losing weight since home from rehab for right femur fracture..  Trying to get some protein drinks and [employer] bowls.  Dec appetite.  Feels like doesn't want to eat.  Denies any abdominal pain or nausea.  She did go to the ED recently for constipation and says that she has been on it with her bowels ever since then they have been moving well she has been using MiraLAX daily and her coffee.  She has not had any swallowing issues.  A lot of days she only eats maybe twice a day.  She is not really not sure how many calories she is actually consuming.  Still having issues with her sinuses.  Dr. Pollyann Kennedy wants to do surgeyr.  Wonders if this could also be impacting her appetite.  Sister is here with her today.  Family members including her sister and her daughter feel like she has been more down.  She herself declines feeling down or depressed but feels like she has had some anxiety.  ROS      Objective:    BP (!) 115/52   Pulse 63   Ht 5\' 5"  (1.651 m)   Wt 114 lb (51.7 kg)   SpO2 100%   BMI 18.97 kg/m     Physical Exam Vitals and nursing note reviewed.  Constitutional:      Appearance: Normal appearance.  HENT:     Head: Normocephalic and atraumatic.     Right Ear: Tympanic membrane, ear canal and external ear normal. There is no impacted cerumen.     Left Ear: Tympanic membrane, ear canal and external ear normal. There is no impacted cerumen.     Ears:     Comments: Conjunctiva pale.     Nose: Nose normal.     Mouth/Throat:     Pharynx: Oropharynx is clear.  Eyes:     Conjunctiva/sclera: Conjunctivae normal.  Cardiovascular:     Rate and Rhythm: Normal rate and regular rhythm.  Pulmonary:     Effort: Pulmonary  effort is normal.     Breath sounds: Normal breath sounds.  Musculoskeletal:     Cervical back: Neck supple. No tenderness.  Lymphadenopathy:     Cervical: No cervical adenopathy.  Skin:    General: Skin is warm and dry.  Neurological:     Mental Status: She is alert and oriented to person, place, and time.  Psychiatric:        Mood and Affect: Mood normal.     No results found for any visits on 08/26/23.      Assessment & Plan:   Problem List Items Addressed This Visit       Other   Decreased appetite   Relevant Orders   CMP14+EGFR   CBC with Differential/Platelet   Fe+TIBC+Fer   TSH + free T4   B12 and Folate Panel   Vitamin B6   Abnormal weight loss - Primary   Relevant Orders   CMP14+EGFR   CBC with Differential/Platelet   Fe+TIBC+Fer   TSH + free T4   B12 and Folate Panel   Vitamin B6    We discussed the weight loss.  I really  want her to keep a food log of what she is eating and how often and try to keep track of calorie intake I want her to at least shoot for 1600 with a goal for weight gain to be closer to 1800 or even up to 2000 cal/day.  Try to find things that she likes.  Her A1c's have been well-controlled so if she wants to eat something sweet for now I am perfectly fine with that as long as we can get some weight on we did discuss that there are some prescription options that could be helpful I think she would be a good candidate for something like Paxil or mirtazapine.  Get some labs today just to rule out deficiencies as well as other potential causes for decreased appetite.  Again focus on getting the calories in with foods that she likes.  I do think that her sinus issues could be contributing as we discussed it can affect how food tastes and smells and affect whether or not she wants to eat it or finish it once she starts eating it.  No orders of the defined types were placed in this encounter.   Return in about 6 weeks (around 10/07/2023) for  weight check .  Nani Gasser, MD

## 2023-08-26 NOTE — Patient Instructions (Signed)
 Try to keep a daily food log with calories Try to get in at least 1600 per day but we are shooting for 1800-2000 per day.

## 2023-08-29 LAB — CBC WITH DIFFERENTIAL/PLATELET
Basophils Absolute: 0 10*3/uL (ref 0.0–0.2)
Basos: 0 %
EOS (ABSOLUTE): 0.1 10*3/uL (ref 0.0–0.4)
Eos: 1 %
Hematocrit: 35.2 % (ref 34.0–46.6)
Hemoglobin: 11.2 g/dL (ref 11.1–15.9)
Immature Grans (Abs): 0 10*3/uL (ref 0.0–0.1)
Immature Granulocytes: 0 %
Lymphocytes Absolute: 2.4 10*3/uL (ref 0.7–3.1)
Lymphs: 33 %
MCH: 28.4 pg (ref 26.6–33.0)
MCHC: 31.8 g/dL (ref 31.5–35.7)
MCV: 89 fL (ref 79–97)
Monocytes Absolute: 0.4 10*3/uL (ref 0.1–0.9)
Monocytes: 5 %
Neutrophils Absolute: 4.5 10*3/uL (ref 1.4–7.0)
Neutrophils: 61 %
Platelets: 242 10*3/uL (ref 150–450)
RBC: 3.94 x10E6/uL (ref 3.77–5.28)
RDW: 13.8 % (ref 11.7–15.4)
WBC: 7.3 10*3/uL (ref 3.4–10.8)

## 2023-08-29 LAB — CMP14+EGFR
ALT: 6 IU/L (ref 0–32)
AST: 16 IU/L (ref 0–40)
Albumin: 3.9 g/dL (ref 3.8–4.8)
Alkaline Phosphatase: 106 IU/L (ref 44–121)
BUN/Creatinine Ratio: 15 (ref 12–28)
BUN: 8 mg/dL (ref 8–27)
Bilirubin Total: <0.2 mg/dL (ref 0.0–1.2)
CO2: 21 mmol/L (ref 20–29)
Calcium: 9.5 mg/dL (ref 8.7–10.3)
Chloride: 104 mmol/L (ref 96–106)
Creatinine, Ser: 0.52 mg/dL — ABNORMAL LOW (ref 0.57–1.00)
Globulin, Total: 2.5 g/dL (ref 1.5–4.5)
Glucose: 93 mg/dL (ref 70–99)
Potassium: 4.1 mmol/L (ref 3.5–5.2)
Sodium: 142 mmol/L (ref 134–144)
Total Protein: 6.4 g/dL (ref 6.0–8.5)
eGFR: 97 mL/min/{1.73_m2} (ref >59)

## 2023-08-29 LAB — B12 AND FOLATE PANEL
Folate: 3.6 ng/mL (ref >3.0)
Vitamin B-12: 908 pg/mL (ref 232–1245)

## 2023-08-29 LAB — IRON,TIBC AND FERRITIN PANEL
Ferritin: 175 ng/mL — ABNORMAL HIGH (ref 15–150)
Iron Saturation: 18 % (ref 15–55)
Iron: 44 ug/dL (ref 27–139)
Total Iron Binding Capacity: 240 ug/dL — ABNORMAL LOW (ref 250–450)
UIBC: 196 ug/dL (ref 118–369)

## 2023-08-29 LAB — TSH+FREE T4
Free T4: 1.24 ng/dL (ref 0.82–1.77)
TSH: 1.14 u[IU]/mL (ref 0.450–4.500)

## 2023-08-29 LAB — VITAMIN B6: Vitamin B6: 6.3 ug/L (ref 3.4–65.2)

## 2023-08-30 ENCOUNTER — Encounter: Payer: Self-pay | Admitting: Family Medicine

## 2023-08-30 NOTE — Progress Notes (Signed)
 Hi Michelle Donovan, metabolic panel is okay.  Iron is still in the normal range but is dropped a slightly compared to a couple of years ago.  I do not think you necessarily need to start taking extra iron I would just work on increasing iron intake through your diet and foods.  Blood count looks great no anemia.  Thyroid looks perfect.  B12 and folate are normal.  Vitamin B6 looks good.  Nothing specific to explain the decreased in appetite but I do just want to make sure that you are getting in enough to nutrition to get the minerals and vitamins that you need for your body.

## 2023-08-31 MED ORDER — PAROXETINE HCL 10 MG PO TABS: ORAL_TABLET | ORAL | 0 refills | Status: DC

## 2023-08-31 NOTE — Progress Notes (Signed)
 Okay, sounds good lets proceed.  Will start with a really dose of the paroxetine.  10 mg daily for 2 weeks and then if feeling comfortable okay to go up to 20 mg.  No identified interactions with current medications.

## 2023-08-31 NOTE — Addendum Note (Signed)
 Addended by: Coni Homesley D on: 08/31/2023 07:26 AM   Modules accepted: Orders

## 2023-09-07 ENCOUNTER — Ambulatory Visit

## 2023-09-07 DIAGNOSIS — Z1231 Encounter for screening mammogram for malignant neoplasm of breast: Secondary | ICD-10-CM

## 2023-09-07 DIAGNOSIS — M810 Age-related osteoporosis without current pathological fracture: Secondary | ICD-10-CM | POA: Diagnosis not present

## 2023-09-07 DIAGNOSIS — Z0000 Encounter for general adult medical examination without abnormal findings: Secondary | ICD-10-CM

## 2023-09-07 DIAGNOSIS — Z780 Asymptomatic menopausal state: Secondary | ICD-10-CM | POA: Diagnosis not present

## 2023-09-07 DIAGNOSIS — Z13820 Encounter for screening for osteoporosis: Secondary | ICD-10-CM | POA: Diagnosis not present

## 2023-09-07 DIAGNOSIS — S72141D Displaced intertrochanteric fracture of right femur, subsequent encounter for closed fracture with routine healing: Secondary | ICD-10-CM

## 2023-09-09 ENCOUNTER — Encounter: Payer: Self-pay | Admitting: Family Medicine

## 2023-09-09 DIAGNOSIS — M810 Age-related osteoporosis without current pathological fracture: Secondary | ICD-10-CM | POA: Insufficient documentation

## 2023-09-09 NOTE — Progress Notes (Signed)
 Hi Manasi, bone density shows a T-score of -3.0 which is consistent with osteoporosis.   The current recommendation for osteoporosis treatment includes:   #1 calcium -total of 1200 mg of calcium  daily.  If you eat a very calcium  rich diet you may be able to obtain that without a supplement.  If not, then I recommend calcium  500 mg twice a day.  There are several products over-the-counter such as Caltrate D and Viactiv chews which are great options that contain calcium  and vitamin D . #2 vitamin D -recommend 800 international units daily. #3 exercise-recommend 30 minutes of weightbearing exercise 3 days a week.  Resistance training ,such as doing bands and light weights, can be particularly helpful. #4 medication-if you are not currently on a bone builder, also called a bisphosphonate, then this has been shown to be very helpful in maintaining bone strength, preventing further thinning of the bones, and reducing your risk for fractures.  I would highly recommend that you consider starting 1 of these medications.  If you are okay with that then please let us  know and we will send one to your pharmacy.  If you would like to discuss further we are happy to make an appointment for you so that we can go over options for treatment.

## 2023-09-12 ENCOUNTER — Encounter: Payer: Self-pay | Admitting: Family Medicine

## 2023-09-12 DIAGNOSIS — M25531 Pain in right wrist: Secondary | ICD-10-CM | POA: Diagnosis not present

## 2023-09-12 NOTE — Progress Notes (Signed)
 Please call patient. Normal mammogram.  Repeat in 1 year.

## 2023-09-14 ENCOUNTER — Other Ambulatory Visit: Payer: Self-pay | Admitting: Family Medicine

## 2023-09-15 ENCOUNTER — Ambulatory Visit: Payer: No Typology Code available for payment source | Admitting: Neurology

## 2023-09-16 ENCOUNTER — Other Ambulatory Visit: Payer: Self-pay | Admitting: Neurology

## 2023-09-16 DIAGNOSIS — G250 Essential tremor: Secondary | ICD-10-CM

## 2023-09-19 ENCOUNTER — Other Ambulatory Visit: Payer: Self-pay | Admitting: Neurology

## 2023-09-19 DIAGNOSIS — G250 Essential tremor: Secondary | ICD-10-CM

## 2023-09-20 ENCOUNTER — Other Ambulatory Visit: Payer: Self-pay

## 2023-09-20 NOTE — Progress Notes (Addendum)
 Assessment/Plan:   1.  Essential Tremor  -Continue propranolol , 60 mg twice per day  -She can continue primidone , 50 mg, 2 in the AM/2 at bed.  She didn't find higher dosages to be effective.  -she we will continue her clonazepam  0.5 mg, 1 in the morning and half tablet in the evening.    - We had a long discussion about her Zonegran  today.  She is currently on 200 mg at night.  I am concerned about the weight loss.  I am concerned that it is at least contributing.  We told her to go off of it in March, but it appears that she only went off for a few days and then tremor got worse and she went back on it.  She does not think that this is the source of weight loss since she has been on it so long, but I told her it could certainly contribute.  Ultimately, I told her I was going to send her primary care physician a message about her weight loss and about this medication and see if it is appropriate to leave her on it or get her off of it, based on her other medications.  - She is not interested in surgical interventions.  - At this point, she does not want a second opinion.  2.  Weight loss  -given paxil  by pcp but pt didn't feel well on it and d/c  -mirtazapine could be an option but will leave this to treating physician.  Will let her primary care physician know about pt stopping paxil  though and she has an upcoming appt in few weeks.  -unknown etiology.  I had thought we d/c the zonegran  which can cause weight loss (she called in March and we had told her to d/c it, as above) but she ended up back on it because of tremor.  She declines going off of it today but I am very very worried about her weight loss today.  I will try and talk with her pcp today.    - We discussed protein supplements.  Patient reports no appetite. ADDENDUM:  talked with [name], MD and she is going to try mirtazapine in few weeks when she sees patient.  For now, we will leave on zonegran  and see how she does  with this plan.  Greatly appreciate care coordination!  Subjective:   Michelle Donovan was seen today in follow up for essential tremor.  My previous records were reviewed prior to todays visit.  She remains frustrated with tremor.  Patient remains on primidone , propranolol , clonazepam .  PDMP was reviewed.  No red flags.  She was unfortunately in the hospital in November after she fractured her right femur and right radius.  She has been losing weight ever since.  She called us  in March stating that she was in the emergency room with constipation and she was told that the constipation was from the Zonegran .  I reviewed those records and we relayed that we thought she may have gotten the information confused as the information from the hospital did not state that.  Instead, it stated "Please discuss your tremor medications, specifically your Zonegran , with your neurologist to see if stopping this medication helps with your bad taste in your mouth in order to improve your appetite."  We did tell her that Zonegran  can cause bad taste with carbonated beverages and can cause weight loss, but would not be associated with the constipation.  She  wanted to discontinue the medication, and we did that.  She did see her primary care physician recently and she did a lot of lab work and started the patient on low-dose Paxil .  She felt that made her nauseated and she d/c it after a few days.      Current prescribed movement disorder medications: Clonazepam  0.5 mg, 1 tab in the morning, half tablet in the evening Primidone , 50 mg, 2 tablets twice per day (she was on 3/2 but didn't think that higher dose was helpful) Propranolol  60 mg twice per day Zonegran , 200 mg q hs  Current/Previously tried tremor medications: topamax  (tried in 2015/16 with success but had hair loss and stopped; retried again up to 200 mg and had hair loss again and she did not think it was effective and we stopped it; retried again in 2021 at  patient request and felt that it was more effective although had hair loss);  Metoprolol  - felt drained ; primidone  (50 mg - 1/2 at night but got first dose effect and stopped it); meclizine (? Why); on propranolol  now - ? Helping now; gabapentin ; artane  (no help); trihexyphenidyl  1 mg in the morning in the morning (higher dosages with side effects)  ALLERGIES:   Allergies  Allergen Reactions   Tylenol  [Acetaminophen ] Other (See Comments)    Increased liver enzymes   Codeine Nausea And Vomiting    CURRENT MEDICATIONS:  Outpatient Encounter Medications as of 09/26/2023  Medication Sig   Calcium -Vitamin D -Vitamin K (VIACTIV CALCIUM  PLUS D) 650-12.5-40 MG-MCG-MCG CHEW Chew 2 each by mouth daily.   cetirizine (ZYRTEC) 10 MG tablet Take 10 mg by mouth daily.   clonazePAM  (KLONOPIN ) 0.5 MG tablet TAKE 1 TABLET IN THE MORNING, 1/2 TABLET EVERY NIGHT AT BEDTIME   ipratropium (ATROVENT ) 0.06 % nasal spray Place 2 sprays into both nostrils 2 (two) times daily.   montelukast  (SINGULAIR ) 10 MG tablet TAKE 1 TABLET BY MOUTH EVERYDAY AT BEDTIME   primidone  (MYSOLINE ) 50 MG tablet TAKE 2 TABLETS BY MOUTH IN THE MORNING AND 2 AT NIGHT.   propranolol  (INDERAL ) 60 MG tablet TAKE 1 TABLET BY MOUTH TWICE A DAY   rosuvastatin  (CRESTOR ) 10 MG tablet Take 1 tablet (10 mg total) by mouth at bedtime. Labs for refills   valACYclovir  (VALTREX ) 1000 MG tablet Take 2 tabs every 12 hours x 1 day as needed for cold sores   zonisamide  (ZONEGRAN ) 100 MG capsule TAKE 2 CAPSULES BY MOUTH EVERY DAY   [DISCONTINUED] PARoxetine  (PAXIL ) 10 MG tablet TAKE 1 TABLET BY MOUTH DAILY FOR 14 DAYS, THEN 2 TABLETS (20 MG TOTAL) DAILY FOR 16 DAYS. (Patient not taking: Reported on 09/26/2023)   [DISCONTINUED] PARoxetine  (PAXIL ) 10 MG tablet Take 1 tablet (10 mg total) by mouth daily for 14 days, THEN 2 tablets (20 mg total) daily for 16 days.   [DISCONTINUED] primidone  (MYSOLINE ) 50 MG tablet TAKE 2 TABLETS BY MOUTH IN THE MORNING AND 2 AT  NIGHT.   No facility-administered encounter medications on file as of 09/26/2023.     Objective:    PHYSICAL EXAMINATION:    VITALS:   Vitals:   09/26/23 1414  BP: 116/72  Pulse: 61  SpO2: 96%  Weight: 114 lb 12.8 oz (52.1 kg)   Wt Readings from Last 3 Encounters:  09/26/23 114 lb 12.8 oz (52.1 kg)  08/26/23 114 lb (51.7 kg)  03/10/23 139 lb 3.2 oz (63.1 kg)      Gen:  Appears stated age and in NAD.  HEENT:  Normocephalic, atraumatic. The mucous membranes are moist. The superficial temporal arteries are without ropiness or tenderness. Cardiovascular: Regular rate and rhythm. Lungs: Clear to auscultation bilaterally. Neck: There are no carotid bruits noted bilaterally.  NEUROLOGICAL:  Orientation:  The patient is alert and oriented x 3.   Cranial nerves: There is good facial symmetry.  Extraocular muscles are intact and visual fields are full to confrontational testing. Speech is fluent and clear.  Hearing is intact to conversational tone. Tone: Tone is good throughout. Sensation: Sensation is intact to light touch throughout. Coordination:  The patient has no difficulty with RAM's or FNF bilaterally.  There is no decremation with any form of RAMS, including alternating supination and pronation of the forearm, hand opening and closing, finger taps.   Motor: Strength is at least antigravity x4 Abnormal movements: No rest tremor.  There is no significant postural tremor.  Mild intention tremor.  She has some trouble with Archimedes spirals. Gait and Station: She ambulates somewhat tenuously, but is stable.     Chemistry      Component Value Date/Time   NA 142 08/26/2023 1531   K 4.1 08/26/2023 1531   CL 104 08/26/2023 1531   CO2 21 08/26/2023 1531   BUN 8 08/26/2023 1531   CREATININE 0.52 (L) 08/26/2023 1531   CREATININE 0.76 08/26/2022 1052      Component Value Date/Time   CALCIUM  9.5 08/26/2023 1531   ALKPHOS 106 08/26/2023 1531   AST 16 08/26/2023 1531   ALT 6  08/26/2023 1531   BILITOT <0.2 08/26/2023 1531      Lab Results  Component Value Date   WBC 7.3 08/26/2023   HGB 11.2 08/26/2023   HCT 35.2 08/26/2023   MCV 89 08/26/2023   PLT 242 08/26/2023   Lab Results  Component Value Date   TSH 1.140 08/26/2023     Chemistry      Component Value Date/Time   NA 142 08/26/2023 1531   K 4.1 08/26/2023 1531   CL 104 08/26/2023 1531   CO2 21 08/26/2023 1531   BUN 8 08/26/2023 1531   CREATININE 0.52 (L) 08/26/2023 1531   CREATININE 0.76 08/26/2022 1052      Component Value Date/Time   CALCIUM  9.5 08/26/2023 1531   ALKPHOS 106 08/26/2023 1531   AST 16 08/26/2023 1531   ALT 6 08/26/2023 1531   BILITOT <0.2 08/26/2023 1531     Total time spent on today's visit was 35 minutes, including both face-to-face time and nonface-to-face time.  Time included that spent on review of records (prior notes available to me/labs/imaging if pertinent), discussing treatment and goals, answering patient's questions and coordinating care.    Cc:  [name], MD

## 2023-09-23 ENCOUNTER — Other Ambulatory Visit: Payer: Self-pay | Admitting: Family Medicine

## 2023-09-26 ENCOUNTER — Ambulatory Visit: Payer: PPO | Admitting: Neurology

## 2023-09-26 VITALS — BP 116/72 | HR 61 | Wt 114.8 lb

## 2023-09-26 DIAGNOSIS — G250 Essential tremor: Secondary | ICD-10-CM | POA: Diagnosis not present

## 2023-09-26 DIAGNOSIS — R634 Abnormal weight loss: Secondary | ICD-10-CM

## 2023-09-26 NOTE — Patient Instructions (Signed)
 I will chat with Dr. Greer Leak about your weight loss and the Zonegran  specifically.  We will call you back and let you know about that conversation.  The physicians and staff at [facility] are committed to providing excellent care. You may receive a survey requesting feedback about your experience at our office. We strive to receive "very good" responses to the survey questions. If you feel that your experience would prevent you from giving the office a "very good " response, please contact our office to try to remedy the situation. We may be reached at [phone]. Thank you for taking the time out of your busy day to complete the survey.

## 2023-10-11 ENCOUNTER — Encounter: Payer: Self-pay | Admitting: Family Medicine

## 2023-10-11 ENCOUNTER — Ambulatory Visit (INDEPENDENT_AMBULATORY_CARE_PROVIDER_SITE_OTHER): Admitting: Family Medicine

## 2023-10-11 VITALS — BP 127/50 | HR 59 | Ht 65.0 in | Wt 115.1 lb

## 2023-10-11 DIAGNOSIS — G250 Essential tremor: Secondary | ICD-10-CM | POA: Diagnosis not present

## 2023-10-11 DIAGNOSIS — I10 Essential (primary) hypertension: Secondary | ICD-10-CM

## 2023-10-11 DIAGNOSIS — R634 Abnormal weight loss: Secondary | ICD-10-CM

## 2023-10-11 NOTE — Progress Notes (Unsigned)
   Established Patient Office Visit  Subjective  Patient ID: Cristol Engdahl, female    DOB: [birth year]  Age: 78 y.o. MRN: [id number]  Chief Complaint  Patient presents with  . Medical Management of Chronic Issues    Pt reports that she has been eating more however she hasn't been keeping a food journal as Dr. Greer Leak had requested.     HPI  Here for follow-up abnormal weight loss-I last saw her about 6 to 7 weeks ago.  Weight has actually remained stable she is trying to increase her caloric intake.  {History (Optional):23778}  ROS    Objective:     BP (!) 127/50   Pulse (!) 59   Ht 5\' 5"  (1.651 m)   Wt 115 lb 1.3 oz (52.2 kg)   SpO2 100%   BMI 19.15 kg/m  {Vitals History (Optional):23777}  Physical Exam   No results found for any visits on 10/11/23.  {Labs (Optional):23779}  The 10-year ASCVD risk score (Arnett DK, et al., 2019) is: 20.7%    Assessment & Plan:   Problem List Items Addressed This Visit   None   No follow-ups on file.    Duaine German, MD

## 2023-10-11 NOTE — Patient Instructions (Signed)
 OK to drop zonegran  down to 1 cap a day, instead of 2.  Let try for 2 weeks. If tremor is nor worse then we can watch your weight over the next 2 months

## 2023-10-12 NOTE — Assessment & Plan Note (Signed)
 Filed Weights   10/11/23 1507  Weight: 115 lb 1.3 oz (52.2 kg)   We discussed medication side effects.  Encouraged her to consider decreaing her Zonegran  to 1 cap daily instead of 2.

## 2023-10-12 NOTE — Assessment & Plan Note (Signed)
BP looks great today!! 

## 2023-10-13 NOTE — Assessment & Plan Note (Addendum)
 She is very worried about adjusting her medications and it causing her tremor to actually be worse she is currently on primidone , propranolol , Zonegran .  She is willing try to do at least a dose reduction on the Zonegran  to start.

## 2023-10-24 ENCOUNTER — Other Ambulatory Visit: Payer: Self-pay | Admitting: Neurology

## 2023-10-24 ENCOUNTER — Telehealth: Payer: Self-pay | Admitting: Neurology

## 2023-10-24 DIAGNOSIS — G250 Essential tremor: Secondary | ICD-10-CM

## 2023-10-24 MED ORDER — CLONAZEPAM 0.5 MG PO TABS: ORAL_TABLET | ORAL | 5 refills | Status: DC

## 2023-10-24 NOTE — Telephone Encounter (Signed)
 Pt needs refill Clonazpam sent to [facility] in [place] [address] , completely out needs ASAP

## 2023-10-30 ENCOUNTER — Other Ambulatory Visit: Payer: Self-pay | Admitting: Family Medicine

## 2023-11-07 ENCOUNTER — Ambulatory Visit: Payer: Self-pay

## 2023-11-07 NOTE — Telephone Encounter (Signed)
 I don't think the calcium  but if she wants to stop it and retry again in a couple of weeks that is fine.

## 2023-11-07 NOTE — Telephone Encounter (Signed)
 FYI Only or Action Required?: Action required by provider: clinical question for provider.  Patient was last seen in primary care on 10/11/2023 by Alvan Dorothyann BIRCH, MD. Called Nurse Triage reporting Medication Problem. Symptoms began several weeks ago. Interventions attempted: Nothing. Symptoms are: unchanged. Has been taking Citra Cal and since then has had issues with her hip and wrist (both old injuries). Decreased movement in wrist and clicking noise in hip. Could this be from calcium ?  Triage Disposition: Call PCP Now  Patient/caregiver understands and will follow disposition?: Yes    Copied from CRM [id number]. Topic: Clinical - Red Word Triage >> Nov 07, 2023 11:37 AM Alfonso ORN wrote: Red Word that prompted transfer to Nurse Triage: Since been on calicum ,citracal patient have some issues with  right wrist the one that was broken is so tight all the way up to arm pit do not have the mobility had before taking the calicum also in the hip area is new when move from the waist hear a clicking sound and the hip catches and  painful until  can get straight in a standing position  .   Patient call [phone] Reason for Disposition  [1] Caller has URGENT medicine question about med that PCP or specialist prescribed AND [2] triager unable to answer question  Answer Assessment - Initial Assessment Questions 1. NAME of MEDICINE: What medicine(s) are you calling about?     Citra cal 2. QUESTION: What is your question? (e.g., double dose of medicine, side effect)     Side effects to right wrist and hip 3. PRESCRIBER: Who prescribed the medicine? Reason: if prescribed by specialist, call should be referred to that group.     PCP 4. SYMPTOMS: Do you have any symptoms? If Yes, ask: What symptoms are you having?  How bad are the symptoms (e.g., mild, moderate, severe)     Limited movement, stiffness to arm 5. PREGNANCY:  Is there any chance that you are pregnant? When was your last  menstrual period?     no  Protocols used: Medication Question Call-A-AH

## 2023-11-08 NOTE — Telephone Encounter (Signed)
 Patient advised.

## 2023-11-10 ENCOUNTER — Encounter: Payer: Self-pay | Admitting: Podiatry

## 2023-11-10 ENCOUNTER — Ambulatory Visit: Admitting: Podiatry

## 2023-11-10 DIAGNOSIS — G6289 Other specified polyneuropathies: Secondary | ICD-10-CM

## 2023-11-10 MED ORDER — GABAPENTIN 300 MG PO CAPS: 300.0000 mg | ORAL_CAPSULE | Freq: Every day | ORAL | 3 refills | Status: AC

## 2023-11-10 NOTE — Progress Notes (Signed)
  Subjective:  Patient ID: Michelle Donovan, female    DOB: 03/09/1946,   MRN: [id number]  Chief Complaint  Patient presents with   Numbness    I think I have Neuropathy.  It all started after Dr. Verta did my bunion surgery.  My feet burn and tingle really bad.  They feel like they are inflated, fat.    78 y.o. female presents for concern of burning and tingling in her feet and concerned she has neuropathy. She relates they feel strange. Does relate some balance issues as well. Recently had a fall and has been recovering from a broken hip.   Denies any other pedal complaints. Denies n/v/f/c.   Past Medical History:  Diagnosis Date   Allergic rhinitis, cause unspecified    Asthma    not bad per pt   Dermatophytosis of scalp and beard    Diverticulosis    Essential and other specified forms of tremor    Fatty liver    Fatty liver disease, nonalcoholic 2017   GERD (gastroesophageal reflux disease)    HTN (hypertension)    history of   Irritable bowel syndrome    Lumbago    bulging disk per MRI    Mixed hyperlipidemia    Other acute reactions to stress    Other diseases of lung, not elsewhere classified    solitary pulm. nodule(left)   PONV (postoperative nausea and vomiting)    Thyroid  nodule    Unspecified asthma(493.90)    Unspecified vitamin D  deficiency     Objective:  Physical Exam: Vascular: DP/PT pulses 2/4 bilateral. CFT <3 seconds. Normal hair growth on digits. No edema.  Skin. No lacerations or abrasions bilateral feet. Nails 1-5 bilateral with white discoloration noted to distal aspect of the nail.  Musculoskeletal: MMT 5/5 bilateral lower extremities in DF, PF, Inversion and Eversion. Deceased ROM in DF of ankle joint. No tenderness to plaption about the foot. Negative tinel sign.  Neurological: Sensation intact to light touch.  Protective sensation diminished blateral.   Assessment:   1. Other polyneuropathy       Plan:  Patient was evaluated and  treated and all questions answered. Discussed neuropathy and etiology as well as treatment with patient.  Radiographs reviewed and discussed with patient.  -Discussed and educated patient on foot care, especially with  regards to the vascular, neurological and musculoskeletal systems.  -Discussed supportive shoes at all times and checking feet regularly.  -Dsicussed topicals  -Gabapentin  300 mg nightly sent to pharmacy  -Patient to return in 3 months for follow-up evaluation.     Asberry Failing, DPM

## 2023-11-22 ENCOUNTER — Other Ambulatory Visit: Payer: Self-pay | Admitting: Family Medicine

## 2023-11-23 DIAGNOSIS — M25551 Pain in right hip: Secondary | ICD-10-CM | POA: Diagnosis not present

## 2023-12-12 ENCOUNTER — Encounter: Payer: Self-pay | Admitting: Family Medicine

## 2023-12-12 ENCOUNTER — Ambulatory Visit (INDEPENDENT_AMBULATORY_CARE_PROVIDER_SITE_OTHER): Admitting: Family Medicine

## 2023-12-12 ENCOUNTER — Ambulatory Visit

## 2023-12-12 VITALS — BP 109/40 | HR 60 | Ht 64.0 in | Wt 113.3 lb

## 2023-12-12 DIAGNOSIS — M5126 Other intervertebral disc displacement, lumbar region: Secondary | ICD-10-CM | POA: Diagnosis not present

## 2023-12-12 DIAGNOSIS — R634 Abnormal weight loss: Secondary | ICD-10-CM

## 2023-12-12 DIAGNOSIS — M5136 Other intervertebral disc degeneration, lumbar region with discogenic back pain only: Secondary | ICD-10-CM | POA: Diagnosis not present

## 2023-12-12 DIAGNOSIS — I10 Essential (primary) hypertension: Secondary | ICD-10-CM

## 2023-12-12 DIAGNOSIS — M545 Low back pain, unspecified: Secondary | ICD-10-CM

## 2023-12-12 DIAGNOSIS — M438X6 Other specified deforming dorsopathies, lumbar region: Secondary | ICD-10-CM | POA: Diagnosis not present

## 2023-12-12 MED ORDER — PROPRANOLOL HCL 40 MG PO TABS: 40.0000 mg | ORAL_TABLET | Freq: Two times a day (BID) | ORAL | 0 refills | Status: DC

## 2023-12-12 MED ORDER — MIRTAZAPINE 7.5 MG PO TABS: 7.5000 mg | ORAL_TABLET | Freq: Every day | ORAL | 1 refills | Status: DC

## 2023-12-12 NOTE — Assessment & Plan Note (Signed)
 Is on the low end of normal some going to decrease her propranolol  just slightly she is also on this medication for her tremors so I do not want to stop it completely but will decrease from 60 mg down to 40 mg to see if this helps with the blood pressure.

## 2023-12-12 NOTE — Assessment & Plan Note (Signed)
 Weight has continued to drop even with decreasing Zonegran  to 1 tab daily but she feels like her tremor is worse we did discuss a trial of mirtazapine  for 6 weeks to see if it is helpful.

## 2023-12-12 NOTE — Patient Instructions (Signed)
 Gena decrease your propranolol  from 60 mg down to 40 mg because of low blood pressures.

## 2023-12-12 NOTE — Progress Notes (Signed)
 Established Patient Office Visit  Subjective  Patient ID: Michelle Donovan, female    DOB: [birth year]  Age: 78 y.o. MRN: [id number]  Chief Complaint  Patient presents with   abnormal weight loss    Follow up    Back Pain    X several weeks.  In speaking with patient - she has been taking Tylenol  500mg  2 in am and 2 in pm since October 24 after fall that fractured her hip and wrist.     HPI  Here for follow-up abnormal weight loss-she was 115 pounds when I saw her in May she is down to 113 so down another 2 pounds.  Blood pressure is a little lower today as well.  We previously discussed stopping the Zonegran  could be very helpful in improving her appetite.  But she felt like it made a difference in her tremor level.  Encouraged her to consider decreasing her Zonegran  to 1 cap daily instead of 2.  She did note go down to 1 cap daily and has been taking that but feels like her tremors actually been worse on the lower dose.  She typically eats twice a day she says usually in the morning she want something sweet so she has usually some type of pastry and then she will eat mid afternoon but then does not really want dinner after that.  She is also still been struggling with her back for several weeks maybe a little over a month.  She said about 20 years ago she had pain in the same area at the time she was told she had some arthritis and bone spurs it eventually went away and has not had any problems since then until about a month ago.  She denies any known trigger, heavy lifting or injury or fall.  She already takes Tylenol  regularly for previous injury to her hip and wrist.  She denies any radiculopathy.  She has not tried heat or ice.  She was also recently started on gabapentin  by Dr. Joya our podiatrist downstairs.    ROS    Objective:     BP (!) 109/40   Pulse 60   Ht 5' 4 (1.626 m)   Wt 113 lb 5 oz (51.4 kg)   SpO2 98%   BMI 19.45 kg/m    Physical Exam Vitals and  nursing note reviewed.  Constitutional:      Appearance: Normal appearance.  HENT:     Head: Normocephalic and atraumatic.  Eyes:     Conjunctiva/sclera: Conjunctivae normal.  Cardiovascular:     Rate and Rhythm: Normal rate and regular rhythm.  Pulmonary:     Effort: Pulmonary effort is normal.     Breath sounds: Normal breath sounds.  Skin:    General: Skin is warm and dry.  Neurological:     Mental Status: She is alert and oriented to person, place, and time.  Psychiatric:        Mood and Affect: Mood normal.        Behavior: Behavior normal.      No results found for any visits on 12/12/23.    The 10-year ASCVD risk score (Arnett DK, et al., 2019) is: 15.6%    Assessment & Plan:   Problem List Items Addressed This Visit       Cardiovascular and Mediastinum   Benign essential hypertension   Is on the low end of normal some going to decrease her propranolol  just slightly she is also on this  medication for her tremors so I do not want to stop it completely but will decrease from 60 mg down to 40 mg to see if this helps with the blood pressure.      Relevant Medications   propranolol  (INDERAL ) 40 MG tablet     Other   LOW BACK PAIN, CHRONIC - Primary   She has had issues with back pain Profore.  But it seems to have flared recently in the last month we will get plain film x-rays today.  Suspect degenerative disc or arthritis flare.  Continue with Tylenol  especially for as that is helpful.  She is already on gabapentin  for her feet.  Give her some stretches for her lumbar spine as well.      Relevant Orders   DG Lumbar Spine Complete   Abnormal weight loss   Weight has continued to drop even with decreasing Zonegran  to 1 tab daily but she feels like her tremor is worse we did discuss a trial of mirtazapine  for 6 weeks to see if it is helpful.      Relevant Medications   mirtazapine  (REMERON ) 7.5 MG tablet   Other Relevant Orders   DG Lumbar Spine Complete    Right low back pain-will get x-ray for further workup especially with recent weight loss and no acute exacerbating factors to cause recurrence of her pain or discomfort.  Recommend she do a trial of heat or ice to see whichever feels better handout given for stretches to do on her own at home.  Return in about 6 weeks (around 01/23/2024) for New start medication and Check BP with nurse visit. SABRA Dorothyann Byars, MD

## 2023-12-12 NOTE — Assessment & Plan Note (Addendum)
 She has had issues with back pain Profore.  But it seems to have flared recently in the last month we will get plain film x-rays today.  Suspect degenerative disc or arthritis flare.  Continue with Tylenol  especially for as that is helpful.  She is already on gabapentin  for her feet.  Give her some stretches for her lumbar spine as well.

## 2023-12-14 ENCOUNTER — Other Ambulatory Visit: Payer: Self-pay | Admitting: Neurology

## 2023-12-14 DIAGNOSIS — G250 Essential tremor: Secondary | ICD-10-CM

## 2023-12-15 NOTE — Telephone Encounter (Signed)
 I read your previous note and it stated that patient was going to try Mirtazapine  and stop the Zinegran. I did not see any other changes   talked with Alvan Dorothyann BIRCH, MD and she is going to try mirtazapine  in few weeks when she sees patient.  For now, we will leave on zonegran  and see how she does with this plan.  Greatly appreciate care coordination!

## 2023-12-19 ENCOUNTER — Ambulatory Visit: Payer: Self-pay | Admitting: Family Medicine

## 2023-12-19 NOTE — Progress Notes (Signed)
 Hi Michelle Donovan, x-ray of your low back shows some degenerative disc at several levels in the low spine as well as some arthritis in the hinge points of your back.  There is a little bit of scoliosis as well at L4-5 and some shifting of the vertebrae slightly out of alignment on L4-L5.  Add like to get you in for formal physical therapy for your back if you are okay with that please let me know we have a great PT department here but if there is something that she would prefer closer to home then please let us  know.

## 2023-12-23 ENCOUNTER — Other Ambulatory Visit: Payer: Self-pay | Admitting: Family Medicine

## 2023-12-23 NOTE — Progress Notes (Signed)
 Ok to [place] her low back exercises and let her know

## 2024-01-04 ENCOUNTER — Other Ambulatory Visit: Payer: Self-pay | Admitting: Family Medicine

## 2024-01-04 DIAGNOSIS — R634 Abnormal weight loss: Secondary | ICD-10-CM

## 2024-01-11 ENCOUNTER — Telehealth: Payer: Self-pay | Admitting: Neurology

## 2024-01-11 NOTE — Telephone Encounter (Signed)
 Pt. Cld ask Pt is having issue with torso feels like it's on fire, as day progresses it gets worse and worse. She saw a DR. For Neuropathy but they suggested she seek help from Neurologist. PC is down no mychart access

## 2024-01-17 NOTE — Telephone Encounter (Signed)
 Called patient she saw Dr. Gabe in [place] and was told to see Neuro Doc that this is not a neuropathy issue. It starts in the morning and gets worse throughout the day. It will cover her whole body in Heat and is not longer able to even wear a bra. Patient had gone back ot 1 Zonegran  at bedtime but had to go back up to 2 pills she isnt sure but doeant think thisis it but it is the only change. Pateitn said it has been going on for a year

## 2024-01-19 ENCOUNTER — Ambulatory Visit: Payer: Self-pay

## 2024-01-19 NOTE — Telephone Encounter (Signed)
 FYI Only or Action Required?: FYI only for provider.  Patient was last seen in primary care on 12/12/2023 by Alvan Dorothyann BIRCH, MD.  Called Nurse Triage reporting Cough.  Symptoms began a week ago.  Interventions attempted: OTC medications: Tylenol .  Symptoms are: gradually worsening.  Triage Disposition: See PCP When Office is Open (Within 3 Days)  Patient/caregiver understands and will follow disposition?: Unsure         Copied from CRM #[id number]. Topic: Clinical - Red Word Triage >> Jan 19, 2024  3:13 PM Mercer PEDLAR wrote: Red Word that prompted transfer to Nurse Triage: Hot flashes, runny nose, congestion, cough, loss of appetite, nausea, has not been feeling well all week. Negative for covid. Reason for Disposition  [1] Nasal discharge AND [2] present > 10 days    Patient states symptoms started 1 week ago.  Answer Assessment - Initial Assessment Questions Patient states she is feel unwell and has a lot of nasal drainage. Currently taking Tylenol  for symptoms. This RN offered an appointment at a different office, pt declined.  This RN offered to make an appointment today at [facility] for symptoms, pt declined. Pt states she has an appointment in office on 01/23/24 and wants to wait to be seen then for symptoms.    1. ONSET: When did the cough begin?      1 week  2. SEVERITY: How bad is the cough today?      Mild  3. DIFFICULTY BREATHING: Are you having difficulty breathing? If Yes, ask: How bad is it? (e.g., mild, moderate, severe)      Denies  4. FEVER: Do you have a fever? If Yes, ask: What is your temperature, how was it measured, and when did it start?     Feels feverish, unable to take temperature  5. CARDIAC HISTORY: Do you have any history of heart disease? (e.g., heart attack, congestive heart failure)      Denies  6. LUNG HISTORY: Do you have any history of lung disease?  (e.g., pulmonary embolus, asthma, emphysema)     Asthma , has a nodule on one of  her lungs  7. OTHER SYMPTOMS: Do you have any other symptoms? (e.g., runny nose, wheezing, chest pain)       Runny nose, congestion, loss of appetite, hot flashes, diarrhea, nasuea not feeling well.  Protocols used: Cough - Acute Non-Productive-A-AH

## 2024-01-23 ENCOUNTER — Ambulatory Visit

## 2024-01-23 ENCOUNTER — Telehealth: Payer: Self-pay | Admitting: Neurology

## 2024-01-23 NOTE — Telephone Encounter (Signed)
 Pt. does not have Mychart access and would like a call back on issue Pt. Cld ask Pt is having issue with torso feels like it's on fire, as day progresses it gets worse and worse. She saw a DR. For Neuropathy but they suggested she seek help from Neurologist.

## 2024-01-23 NOTE — Telephone Encounter (Signed)
 Called back and gave patient Dr. Martie advice patient has Covid at this time

## 2024-01-29 ENCOUNTER — Other Ambulatory Visit: Payer: Self-pay | Admitting: Family Medicine

## 2024-01-31 ENCOUNTER — Ambulatory Visit (INDEPENDENT_AMBULATORY_CARE_PROVIDER_SITE_OTHER)

## 2024-01-31 VITALS — BP 129/57 | HR 58 | Ht 64.0 in | Wt 113.0 lb

## 2024-01-31 DIAGNOSIS — I10 Essential (primary) hypertension: Secondary | ICD-10-CM

## 2024-01-31 NOTE — Progress Notes (Signed)
 Patient is in office today for a nurse visit for Blood Pressure Check. Patient reports taking propanolol 40mg  twice daily. She states that her tremors are worsening with the decrease in the dose. After sitting for 10 minutes, a second BP reading was obtained: 129/57.   Per Dr. Alvan, pt need to stay on the current dose of propanolol 40mg  BID to keep BP stable and help prevent potential falls. She should discuss other possible treatments for tremors with Neurology.   Discussed above with patient and she verbalized understanding. Patient had some additional questions that needed to be addressed by Dr. Alvan so she scheduled a follow up appointment in October.

## 2024-02-01 ENCOUNTER — Other Ambulatory Visit: Payer: Self-pay

## 2024-02-01 ENCOUNTER — Telehealth: Payer: Self-pay | Admitting: Neurology

## 2024-02-01 NOTE — Telephone Encounter (Signed)
 Sent to Dr. Evonnie for approval to increase Zonegran  to 2 pills before I can send in RX

## 2024-02-01 NOTE — Telephone Encounter (Signed)
 Pt. Called in this afternoon and  she stated  that she need a new prescription for  zonisamide  (ZONEGRAN ) 100 MG capsule 100 mg, Daily  She needs the prescription to be twice a day, not one a day.  Pt stated that she was taking one a day but then  she started taking 2 a day, so now she only has 2 to 3 day left of the prescription.  Please reach pt by her phone which her number is [phone]. Thanks

## 2024-02-01 NOTE — Telephone Encounter (Signed)
 Patient is now taking 2 a day approval to send prescription

## 2024-02-02 MED ORDER — ZONISAMIDE 100 MG PO CAPS: 200.0000 mg | ORAL_CAPSULE | Freq: Every day | ORAL | 0 refills | Status: DC

## 2024-02-09 ENCOUNTER — Ambulatory Visit: Admitting: Podiatry

## 2024-02-10 ENCOUNTER — Ambulatory Visit: Admitting: Podiatry

## 2024-02-20 ENCOUNTER — Ambulatory Visit (INDEPENDENT_AMBULATORY_CARE_PROVIDER_SITE_OTHER): Admitting: Family Medicine

## 2024-02-20 ENCOUNTER — Encounter: Payer: Self-pay | Admitting: Family Medicine

## 2024-02-20 VITALS — BP 126/54 | HR 56 | Temp 98.0°F | Ht 64.0 in | Wt 111.0 lb

## 2024-02-20 DIAGNOSIS — R634 Abnormal weight loss: Secondary | ICD-10-CM | POA: Diagnosis not present

## 2024-02-20 DIAGNOSIS — R1010 Upper abdominal pain, unspecified: Secondary | ICD-10-CM | POA: Diagnosis not present

## 2024-02-20 DIAGNOSIS — R202 Paresthesia of skin: Secondary | ICD-10-CM

## 2024-02-20 DIAGNOSIS — R198 Other specified symptoms and signs involving the digestive system and abdomen: Secondary | ICD-10-CM | POA: Diagnosis not present

## 2024-02-20 NOTE — Progress Notes (Signed)
 Established Patient Office Visit  Subjective  Patient ID: Michelle Donovan, female    DOB: Oct 18, 1945  Age: 78 y.o. MRN: [id number]  Chief Complaint  Patient presents with   Generalized Body Aches    Pt is experiencing a burning sensation in the abd area all around to her back    HPI Discussed the use of AI scribe software for clinical note transcription with the patient, who gave verbal consent to proceed.  She opted to note take the Remeron  after reading side effects.    History of Present Illness Michelle Donovan is a 78 year old female who presents with burning pain across her upper abdomen.  Epigastric burning pain - Burning pain across the upper abdomen for over one year - Pain described as 'like it's on fire' - Pain located in the softer part of the abdomen, below the ribs - Pain not related to eating or bowel movements - Pain worsens as the day progresses - Lying down sometimes alleviates the discomfort - No associated rash or bumps - No prior use of cool packs or numbing gels for symptom relief - No gallbladder  Unintentional weight loss - Significant weight loss - Concern for possible undiagnosed malignancy due to family history  Peripheral neuropathy - History of neuropathy in the feet - No identified connection between neuropathy and abdominal pain after consultation with other physicians  History of herpes zoster - History of shingles presenting as a cluster of fever and tingling on the face - No history of shingles affecting the abdomen \   ROS    Objective:     BP (!) 126/54 (BP Location: Left Arm, Patient Position: Sitting, Cuff Size: Normal)   Pulse (!) 56   Temp 98 F (36.7 C) (Oral)   Ht 5' 4 (1.626 m)   Wt 111 lb (50.3 kg)   SpO2 99%   BMI 19.05 kg/m    Physical Exam Vitals and nursing note reviewed.  Constitutional:      Appearance: Normal appearance.  HENT:     Head: Normocephalic and atraumatic.  Eyes:      Conjunctiva/sclera: Conjunctivae normal.  Cardiovascular:     Rate and Rhythm: Normal rate and regular rhythm.  Pulmonary:     Effort: Pulmonary effort is normal.     Breath sounds: Normal breath sounds.  Abdominal:     General: Bowel sounds are normal. There is no distension.     Palpations: Abdomen is soft.     Tenderness: There is no abdominal tenderness.  Skin:    General: Skin is warm and dry.  Neurological:     Mental Status: She is alert.  Psychiatric:        Mood and Affect: Mood normal.      No results found for any visits on 02/20/24.    The 10-year ASCVD risk score (Arnett DK, et al., 2019) is: 22.2%    Assessment & Plan:   Problem List Items Addressed This Visit       Other   Abnormal weight loss   Relevant Orders   CBC with Differential/Platelet   CMP14+EGFR   Lipid panel   Lipase   Other Visit Diagnoses       Abdominal burning sensation in left upper quadrant    -  Primary   Relevant Orders   CBC with Differential/Platelet   CMP14+EGFR   Lipid panel   Lipase     Abdominal burning sensation in right upper quadrant  Relevant Orders   CBC with Differential/Platelet   CMP14+EGFR   Lipid panel   Lipase     Upper abdominal pain       Relevant Orders   CBC with Differential/Platelet   CMP14+EGFR   Lipid panel   Lipase     Paresthesias       Relevant Orders   Vitamin B1   Vitamin B6      Assessment and Plan Assessment & Plan Chronic upper abdominal burning pain Chronic burning pain in the upper abdomen for over a year, worsening over time. Differential diagnosis includes liver, pancreas, or other abdominal structures. No direct symptoms of AFib. No history of abdominal shingles. - Order blood tests for liver and pancreatic enzymes. - Consider imaging if blood tests show abnormalities. - Discussed use of cool packs or lidocaine  gel for relief.   No follow-ups on file.    Dorothyann Byars, MD

## 2024-02-21 ENCOUNTER — Telehealth: Payer: Self-pay

## 2024-02-21 NOTE — Telephone Encounter (Signed)
 The patient has been updated that the provider has not reviewed the recent lab results. Verbalized understanding. The patient does not access to a computer / MyChart at this time. Requesting a call once results have been reviewed by the provider.

## 2024-02-21 NOTE — Telephone Encounter (Signed)
 Copied from CRM [id number]. Topic: Clinical - Lab/Test Results >> Feb 21, 2024  4:26 PM Zebedee SAUNDERS wrote: Reason for CRM: Pt calling for lab results from 02/20/2024, please call pt at [phone].

## 2024-02-23 ENCOUNTER — Telehealth: Payer: Self-pay

## 2024-02-23 ENCOUNTER — Ambulatory Visit: Payer: Self-pay | Admitting: Family Medicine

## 2024-02-23 NOTE — Progress Notes (Signed)
 HI Michelle Donovan,  Your labs and cholesterol overall look good.  B1 anb B6 are still pending.  No sign of pancreatitis.  Next step would be to consider getting her in with GI.

## 2024-02-23 NOTE — Telephone Encounter (Signed)
 Spoke with patient. Informed of results and recommendations - she wants to hold off on seeing GI for now. She wants to think on this. She will give us  a call if she decides to proceed.  States she has a lot of home stress right now and may be having to care for her brother who was just diagnosed with cancer.

## 2024-02-23 NOTE — Telephone Encounter (Signed)
 Copied from CRM [id number]. Topic: Clinical - Lab/Test Results >> Feb 23, 2024  8:32 AM Turkey B wrote: Reason for CRM: Patient called for lab results not in yet, please cb when notes are in

## 2024-02-24 ENCOUNTER — Ambulatory Visit (INDEPENDENT_AMBULATORY_CARE_PROVIDER_SITE_OTHER): Admitting: Podiatry

## 2024-02-24 DIAGNOSIS — Z91199 Patient's noncompliance with other medical treatment and regimen due to unspecified reason: Secondary | ICD-10-CM

## 2024-02-24 NOTE — Progress Notes (Signed)
 Cancel 24 hours- sick

## 2024-02-25 LAB — CBC WITH DIFFERENTIAL/PLATELET
Basophils Absolute: 0 x10E3/uL (ref 0.0–0.2)
Basos: 0 %
EOS (ABSOLUTE): 0.1 x10E3/uL (ref 0.0–0.4)
Eos: 1 %
Hematocrit: 34.7 % (ref 34.0–46.6)
Hemoglobin: 11 g/dL — ABNORMAL LOW (ref 11.1–15.9)
Immature Grans (Abs): 0 x10E3/uL (ref 0.0–0.1)
Immature Granulocytes: 0 %
Lymphocytes Absolute: 2 x10E3/uL (ref 0.7–3.1)
Lymphs: 32 %
MCH: 28.5 pg (ref 26.6–33.0)
MCHC: 31.7 g/dL (ref 31.5–35.7)
MCV: 90 fL (ref 79–97)
Monocytes Absolute: 0.4 x10E3/uL (ref 0.1–0.9)
Monocytes: 6 %
Neutrophils Absolute: 3.8 x10E3/uL (ref 1.4–7.0)
Neutrophils: 61 %
Platelets: 211 x10E3/uL (ref 150–450)
RBC: 3.86 x10E6/uL (ref 3.77–5.28)
RDW: 13.6 % (ref 11.7–15.4)
WBC: 6.2 x10E3/uL (ref 3.4–10.8)

## 2024-02-25 LAB — LIPID PANEL
Chol/HDL Ratio: 3.9 ratio (ref 0.0–4.4)
Cholesterol, Total: 157 mg/dL (ref 100–199)
HDL: 40 mg/dL (ref >39)
LDL Chol Calc (NIH): 94 mg/dL (ref 0–99)
Triglycerides: 128 mg/dL (ref 0–149)
VLDL Cholesterol Cal: 23 mg/dL (ref 5–40)

## 2024-02-25 LAB — CMP14+EGFR
ALT: 6 IU/L (ref 0–32)
AST: 12 IU/L (ref 0–40)
Albumin: 3.8 g/dL (ref 3.8–4.8)
Alkaline Phosphatase: 104 IU/L (ref 49–135)
BUN/Creatinine Ratio: 14 (ref 12–28)
BUN: 9 mg/dL (ref 8–27)
Bilirubin Total: <0.2 mg/dL (ref 0.0–1.2)
CO2: 25 mmol/L (ref 20–29)
Calcium: 9.5 mg/dL (ref 8.7–10.3)
Chloride: 100 mmol/L (ref 96–106)
Creatinine, Ser: 0.63 mg/dL (ref 0.57–1.00)
Globulin, Total: 2.8 g/dL (ref 1.5–4.5)
Glucose: 92 mg/dL (ref 70–99)
Potassium: 4.4 mmol/L (ref 3.5–5.2)
Sodium: 137 mmol/L (ref 134–144)
Total Protein: 6.6 g/dL (ref 6.0–8.5)
eGFR: 92 mL/min/1.73 (ref >59)

## 2024-02-25 LAB — LIPASE: Lipase: 38 U/L (ref 14–85)

## 2024-02-25 LAB — VITAMIN B1: Thiamine: 72.8 nmol/L (ref 66.5–200.0)

## 2024-02-25 LAB — VITAMIN B6: Vitamin B6: 6.2 ug/L (ref 3.4–65.2)

## 2024-02-27 NOTE — Progress Notes (Signed)
 Hi Abagail, your vitamins B1 and B6 looks OK.

## 2024-03-09 ENCOUNTER — Other Ambulatory Visit: Payer: Self-pay | Admitting: Family Medicine

## 2024-03-19 ENCOUNTER — Telehealth: Payer: Self-pay | Admitting: Neurology

## 2024-03-19 ENCOUNTER — Other Ambulatory Visit: Payer: Self-pay | Admitting: Neurology

## 2024-03-19 DIAGNOSIS — G250 Essential tremor: Secondary | ICD-10-CM

## 2024-03-19 NOTE — Telephone Encounter (Signed)
 Pt is completely out or Rx primidone  (MYSOLINE ) 50 MG tablet and would [phone] [place],  - [address]

## 2024-03-26 ENCOUNTER — Other Ambulatory Visit: Payer: Self-pay | Admitting: Neurology

## 2024-03-26 DIAGNOSIS — G250 Essential tremor: Secondary | ICD-10-CM

## 2024-03-26 MED ORDER — PRIMIDONE 50 MG PO TABS
ORAL_TABLET | ORAL | 0 refills | Status: DC
Start: 2024-03-26 — End: 2024-03-27

## 2024-03-27 IMAGING — CT CT ABD-PELV W/ CM
2 of 5 series · 17 of 46 positions shown, 19 images · IV contrast (agent unspecified)
Comparison: 10/22/2019

CLINICAL DATA: Right lower quadrant abdominal pain

EXAM:
CT ABDOMEN AND PELVIS WITH CONTRAST
TECHNIQUE: Multidetector CT imaging of the abdomen and pelvis was performed
using the standard protocol following bolus administration of
intravenous contrast.

[Series 3: a/p w/ 5mm · axial · 0.70mm/px · z∈[-429,-69]mm · 14 of 82 slices shown, 16 images]
[im 5/82  soft-tissue]
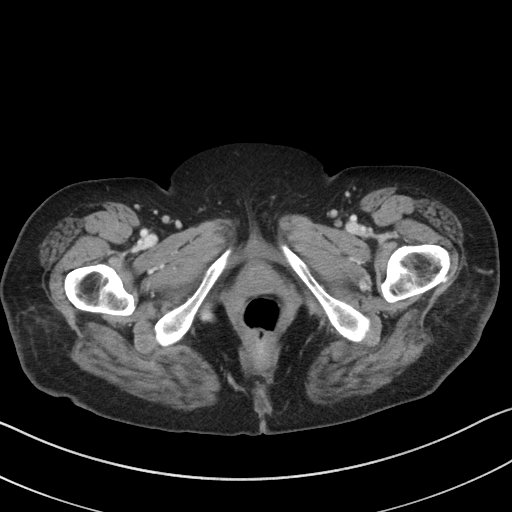
[im 5/82  bone]
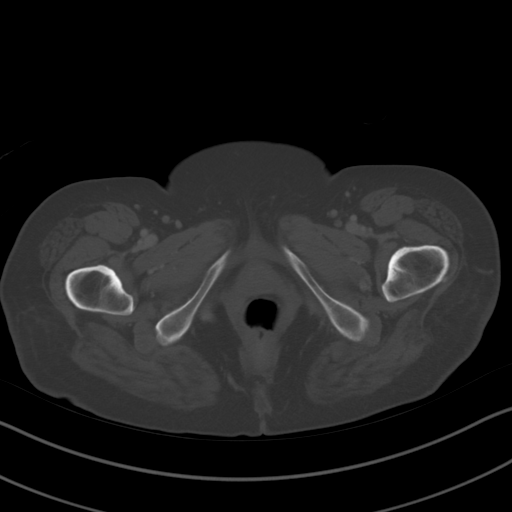
[im 9/82  soft-tissue]
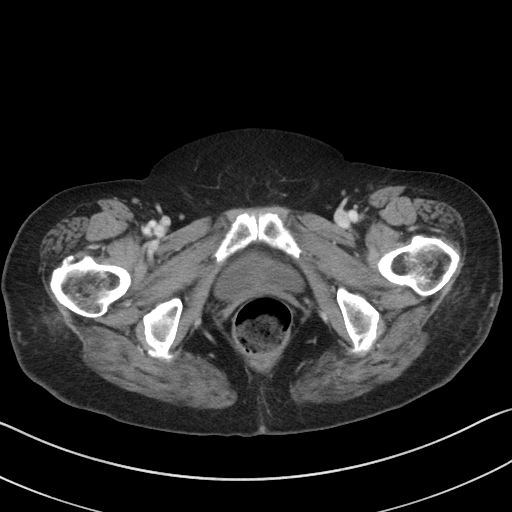
[im 18/82  soft-tissue]
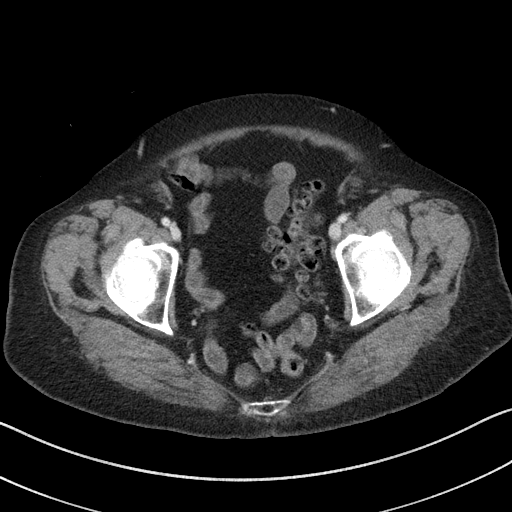
[im 22/82  soft-tissue]
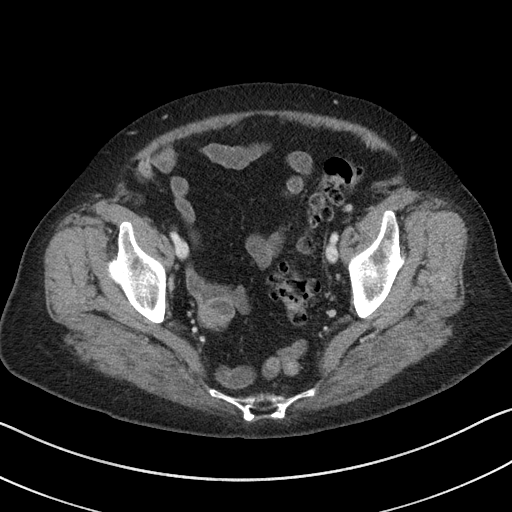
[im 26/82  soft-tissue]
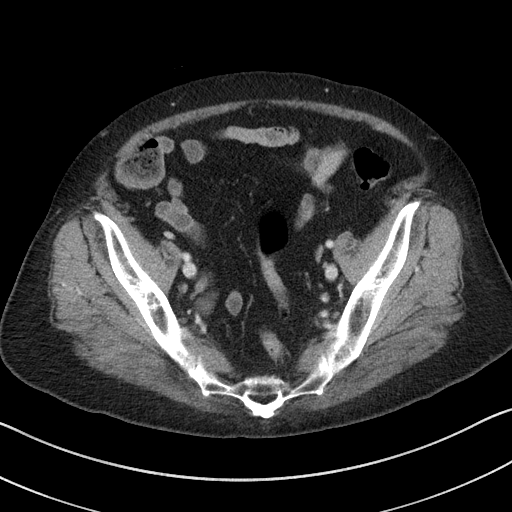
[im 35/82  soft-tissue]
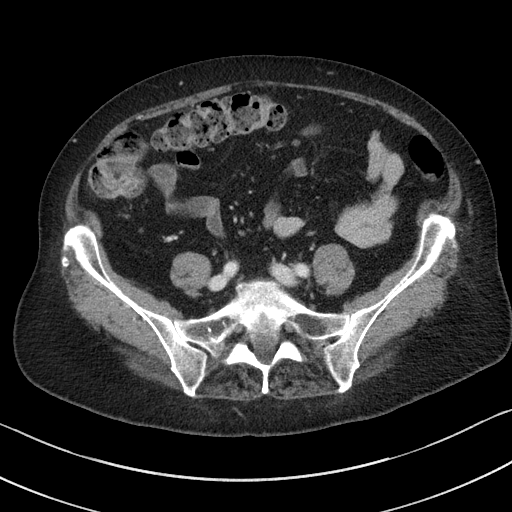
[im 39/82  soft-tissue]
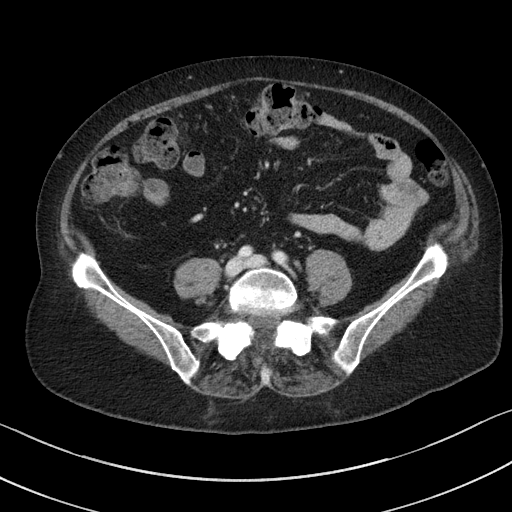
[im 43/82  soft-tissue]
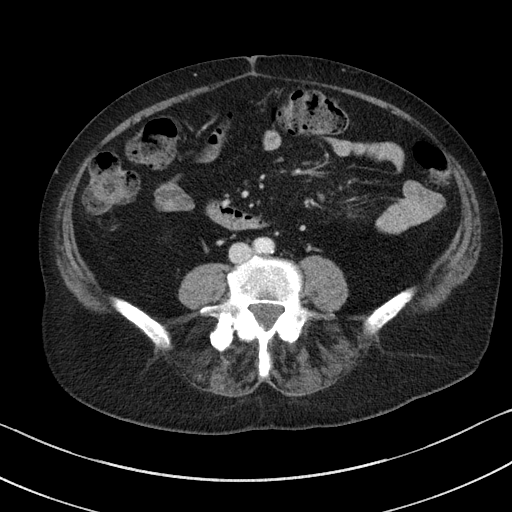
[im 47/82  soft-tissue]
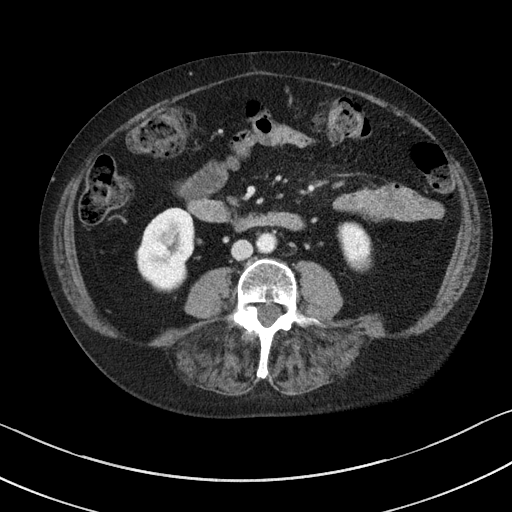
[im 47/82  bone]
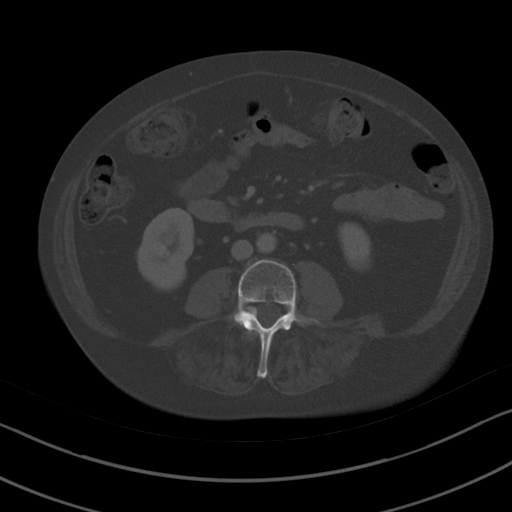
[im 56/82  soft-tissue]
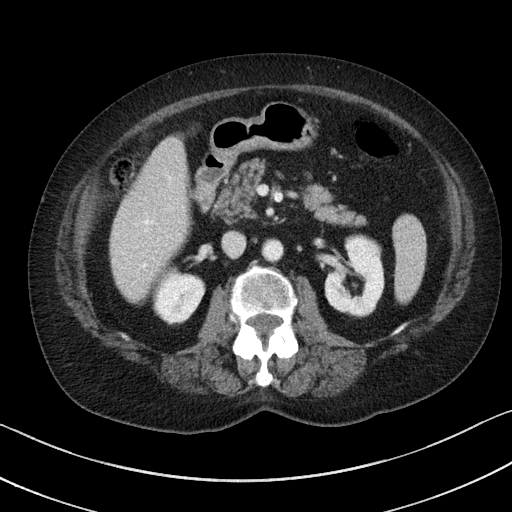
[im 60/82  soft-tissue]
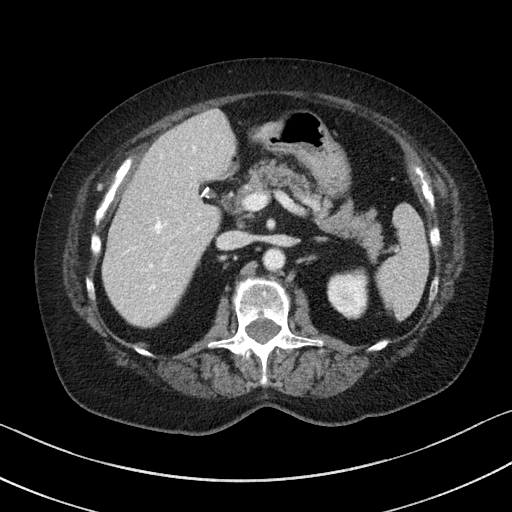
[im 64/82  soft-tissue]
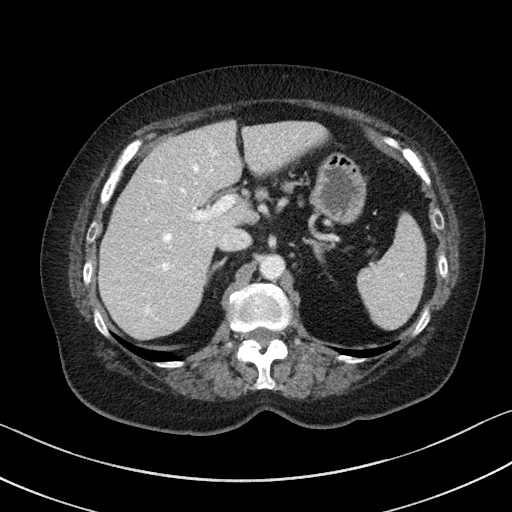
[im 73/82  soft-tissue]
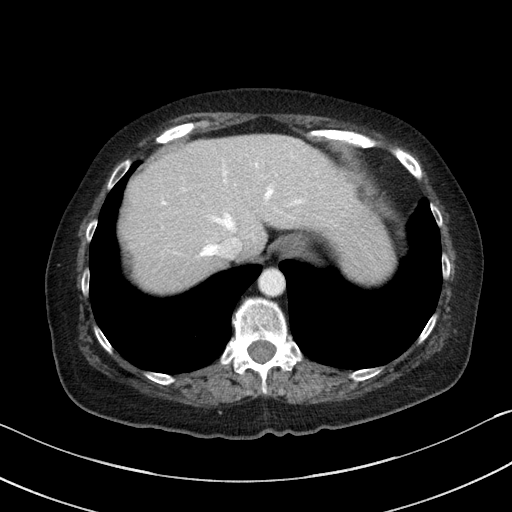
[im 77/82  soft-tissue]
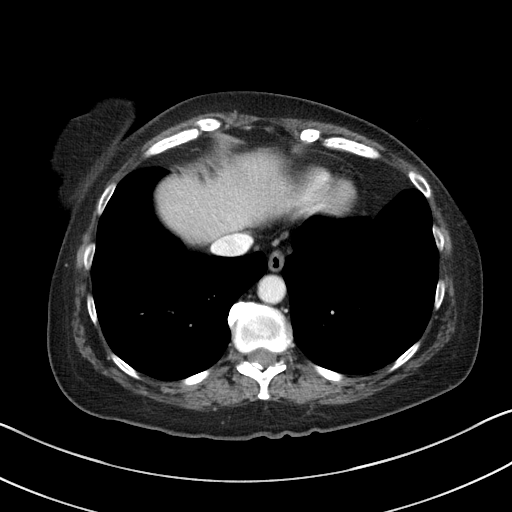

[Series 6: a/p w/ cor · coronal · 0.76mm/px · 3 of 148 slices shown]
[im 50/148  soft-tissue]
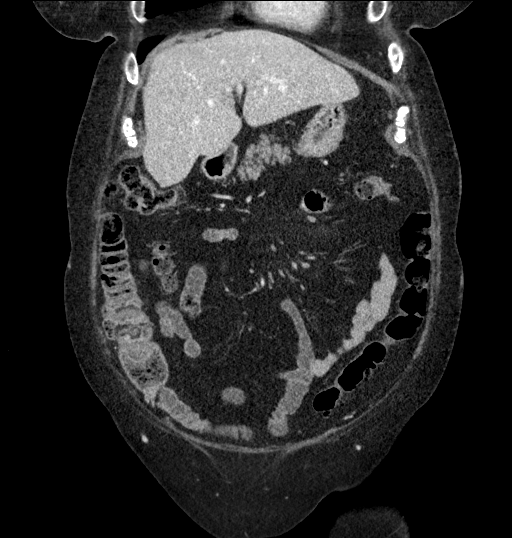
[im 66/148  soft-tissue]
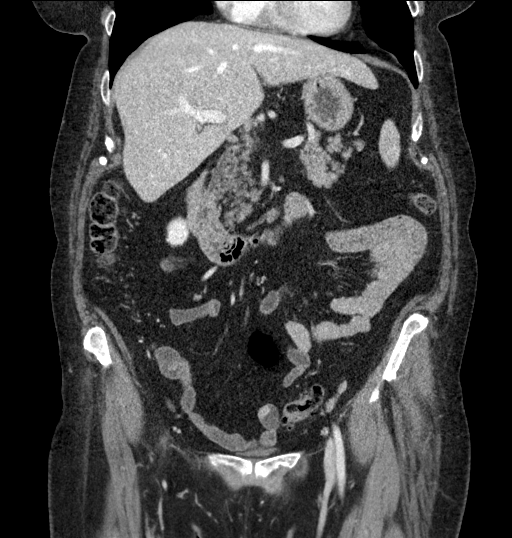
[im 82/148  soft-tissue]
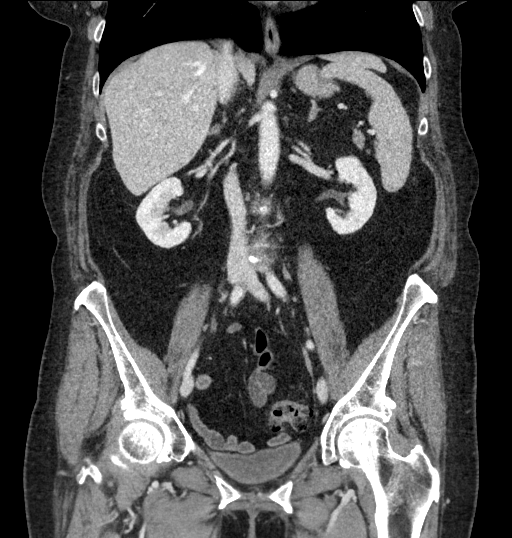

[17 of 46 positions shown; findings below may reference images not displayed]

RADIATION DOSE REDUCTION: This exam was performed according to the
departmental dose-optimization program which includes automated
exposure control, adjustment of the mA and/or kV according to
patient size and/or use of iterative reconstruction technique.

CONTRAST:  75mL OMNIPAQUE IOHEXOL 350 MG/ML SOLN
FINDINGS: Lower Chest: Normal.

Hepatobiliary: Normal hepatic contours. No intra- or extrahepatic
biliary dilatation. Status post cholecystectomy.

Pancreas: Normal pancreas. No ductal dilatation or peripancreatic
fluid collection.

Spleen: Normal.

Adrenals/Urinary Tract: The adrenal glands are normal. No
hydronephrosis, nephroureterolithiasis or solid renal mass. The
urinary bladder is normal for degree of distention

Stomach/Bowel: There is no hiatal hernia. Normal duodenal course and
caliber. No small bowel dilatation or inflammation. Rectosigmoid
diverticulosis without acute inflammation. The appendix is not
visualized. No right lower quadrant inflammation or free fluid.

Vascular/Lymphatic: Normal course and caliber of the major abdominal
vessels. No abdominal or pelvic lymphadenopathy.

Reproductive: Status post hysterectomy. No adnexal mass.

Other: None.

Musculoskeletal: No bony spinal canal stenosis or focal osseous
abnormality.
IMPRESSION: No acute abnormality of the abdomen or pelvis.

## 2024-04-16 NOTE — Progress Notes (Unsigned)
 Assessment/Plan:  Assessment and Plan Assessment & Plan Essential tremor Managed with primidone  and propranolol . Propranolol  dose reduced due to hypotension. Primidone  dose not increased due to ineffectiveness at higher doses. Zonisamide  discontinued due to weight loss.  - Continue primidone  50 mg, 2 tablets in the morning and 2 at night. - Continue propranolol  40 mg twice daily as prescribed by cardiology. - Discontinued zonisamide  due to weight loss.  Pt frustrated about this but told her that this is necessary - Consider DAT scan to assess dopamine levels.  After some discussion, she opted to hold on that  2. Unintentional weight loss - Discontinued zonisamide  due to weight loss.  3.  Right hand dysfunction after prior fracture Right hand dysfunction persists following a fracture a year ago. Hand function has not returned to baseline, which makes assessment for neurodegen condition difficult. - Consider DAT scan to assess for potential Parkinson's disease.  For right now, patient declined  4.  Patient notes that travel to [facility] is too far and states that she will have to d/c her care with us .  Asked her if I can help transition her care elsewhere but she doesn't want to do that and wants pcp to resume care.    Subjective:   Michelle Donovan was seen today in follow up for essential tremor.  My previous records were reviewed prior to todays visit.  She remains frustrated with tremor.  Patient remains on primidone , propranolol , clonazepam .  PDMP was reviewed.  No red flags.  Last visit, I was concerned about patient's weight loss, but patient wanted to stay on the Zonegran .  The compromises that she was going to trial mirtazapine  with Dr. Alvan while staying on the Zonegran .  However, dispense report indicates that the mirtazapine  was only picked up 1 time for a 30-day supply back in July and patient admits she never took it.  Patient's weight has continued to decrease,  albeit slightly.  Tremor is worse.  She worries about her weight loss and gait instability  Current prescribed movement disorder medications: Clonazepam  0.5 mg, 1 tab in the morning, half tablet in the evening Primidone , 50 mg, 2 tablets twice per day (she was on 3/2 but didn't think that higher dose was helpful) Propranolol  40 mg twice per day Zonegran , 200 mg q hs  Current/Previously tried tremor medications: topamax  (tried in 2015/16 with success but had hair loss and stopped; retried again up to 200 mg and had hair loss again and she did not think it was effective and we stopped it; retried again in 2021 at patient request and felt that it was more effective although had hair loss);  Metoprolol  - felt drained ; primidone  (50 mg - 1/2 at night but got first dose effect and stopped it); meclizine (? Why); on propranolol  now - ? Helping now; gabapentin ; artane  (no help); trihexyphenidyl  1 mg in the morning in the morning (higher dosages with side effects); mirtazapine  (given but didn't take it)  ALLERGIES:   Allergies  Allergen Reactions   Tylenol  [Acetaminophen ] Other (See Comments)    Increased liver enzymes   Codeine Nausea And Vomiting    CURRENT MEDICATIONS:  Outpatient Encounter Medications as of 04/17/2024  Medication Sig   Calcium -Vitamin D -Vitamin K (VIACTIV CALCIUM  PLUS D) 650-12.5-40 MG-MCG-MCG CHEW Chew 2 each by mouth daily.   cetirizine (ZYRTEC) 10 MG tablet Take 10 mg by mouth daily.   clonazePAM  (KLONOPIN ) 0.5 MG tablet TAKE 1 TABLET IN THE MORNING, 1/2 TABLET EVERY  NIGHT AT BEDTIME   gabapentin  (NEURONTIN ) 300 MG capsule Take 1 capsule (300 mg total) by mouth at bedtime.   ipratropium (ATROVENT ) 0.06 % nasal spray PLACE 2 SPRAYS INTO BOTH NOSTRILS 2 (TWO) TIMES DAILY   montelukast  (SINGULAIR ) 10 MG tablet TAKE 1 TABLET BY MOUTH EVERYDAY AT BEDTIME   primidone  (MYSOLINE ) 50 MG tablet TAKE 2 TABLETS BY MOUTH IN THE MORNING AND 2 AT NIGHT.   propranolol  (INDERAL ) 40 MG  tablet TAKE 1 TABLET BY MOUTH TWICE A DAY   rosuvastatin  (CRESTOR ) 10 MG tablet TAKE 1 TABLET (10 MG TOTAL) BY MOUTH AT BEDTIME. LABS FOR REFILLS   valACYclovir  (VALTREX ) 1000 MG tablet Take 2 tabs every 12 hours x 1 day as needed for cold sores   zonisamide  (ZONEGRAN ) 100 MG capsule Take 2 capsules (200 mg total) by mouth at bedtime.   No facility-administered encounter medications on file as of 04/17/2024.     Objective:    PHYSICAL EXAMINATION:    VITALS:   Vitals:   04/17/24 1451  BP: 124/76  Pulse: 64  SpO2: 98%  Weight: 109 lb 12.8 oz (49.8 kg)    Wt Readings from Last 3 Encounters:  04/17/24 109 lb 12.8 oz (49.8 kg)  02/20/24 111 lb (50.3 kg)  01/31/24 113 lb (51.3 kg)      Gen:  Appears stated age and in NAD.  Tearful at times HEENT:  Normocephalic, atraumatic. The mucous membranes are moist. The superficial temporal arteries are without ropiness or tenderness. Cardiovascular: Regular rate and rhythm. Lungs: Clear to auscultation bilaterally. Neck: There are no carotid bruits noted bilaterally.  NEUROLOGICAL:  Orientation:  The patient is alert and oriented x 3.   Cranial nerves: There is good facial symmetry.  Extraocular muscles are intact and visual fields are full to confrontational testing. Speech is fluent and clear.  Hearing is intact to conversational tone. Tone: ? Mild increased RUE but she is guarding from fx a year ago Sensation: Sensation is intact to light touch throughout. Coordination:  The patient has no difficulty with RAM's or FNF bilaterally.  There is no decremation with any form of RAMS, including alternating supination and pronation of the forearm, hand opening and closing, finger taps.   Motor: Strength is at least antigravity x4 Abnormal movements: rare rest tremor on the R.  There is no significant postural tremor.  Mod intention tremor on the R.  There is mild to mod jaw tremor Gait and Station: pushes off to arise.  She ambulates somewhat  tenuously with mild decrease arm swing on the L.  Turns slow and mild unsteady     Chemistry      Component Value Date/Time   NA 137 02/20/2024 1436   K 4.4 02/20/2024 1436   CL 100 02/20/2024 1436   CO2 25 02/20/2024 1436   BUN 9 02/20/2024 1436   CREATININE 0.63 02/20/2024 1436   CREATININE 0.76 08/26/2022 1052      Component Value Date/Time   CALCIUM  9.5 02/20/2024 1436   ALKPHOS 104 02/20/2024 1436   AST 12 02/20/2024 1436   ALT 6 02/20/2024 1436   BILITOT <0.2 02/20/2024 1436      Lab Results  Component Value Date   WBC 6.2 02/20/2024   HGB 11.0 (L) 02/20/2024   HCT 34.7 02/20/2024   MCV 90 02/20/2024   PLT 211 02/20/2024   Lab Results  Component Value Date   TSH 1.140 08/26/2023     Chemistry  Component Value Date/Time   NA 137 02/20/2024 1436   K 4.4 02/20/2024 1436   CL 100 02/20/2024 1436   CO2 25 02/20/2024 1436   BUN 9 02/20/2024 1436   CREATININE 0.63 02/20/2024 1436   CREATININE 0.76 08/26/2022 1052      Component Value Date/Time   CALCIUM  9.5 02/20/2024 1436   ALKPHOS 104 02/20/2024 1436   AST 12 02/20/2024 1436   ALT 6 02/20/2024 1436   BILITOT <0.2 02/20/2024 1436     Total time spent on today's visit was 40 minutes, including both face-to-face time and nonface-to-face time.  Time included that spent on review of records (prior notes available to me/labs/imaging if pertinent), discussing treatment and goals, answering patient's questions and coordinating care.    Cc:  Alvan Dorothyann BIRCH, MD

## 2024-04-17 ENCOUNTER — Ambulatory Visit: Admitting: Neurology

## 2024-04-17 VITALS — BP 124/76 | HR 64 | Wt 109.8 lb

## 2024-04-17 DIAGNOSIS — G250 Essential tremor: Secondary | ICD-10-CM

## 2024-04-17 DIAGNOSIS — R634 Abnormal weight loss: Secondary | ICD-10-CM

## 2024-04-17 NOTE — Patient Instructions (Addendum)
 STOP Zonegran    VISIT SUMMARY: Today, we discussed your essential tremor, ongoing weight loss, right hand dysfunction, and impaired mobility. We reviewed your current medications and made some adjustments to address your concerns.  YOUR PLAN: -ESSENTIAL TREMOR: Essential tremor is a nervous system disorder that causes involuntary and rhythmic shaking. You will continue taking primidone  (50 mg, 2 tablets in the morning and 2 at night) and propranolol  (40 mg twice daily). We have discontinued zonisamide  due to its association with weight loss. We are considering a DAT scan to rule out Parkinson's disease but ultimately you decided not to do that scan right now  -UNINTENTIONAL WEIGHT LOSS: You have been experiencing ongoing weight loss. We have discontinued zonisamide , which may have been contributing to this issue.  -RIGHT HAND DYSFUNCTION AFTER PRIOR FRACTURE: Your right hand continues to have dysfunction following a fracture a year ago. We are considering a DAT scan to assess for potential Parkinson's disease.  -IMPAIRED MOBILITY: Your reduced mobility is likely related to your previous fall and right hand dysfunction. No recent falls or passing out spells have been reported.  INSTRUCTIONS: Please continue taking your medications as prescribed. We will miss you!  Take care of yourself                    Contains text generated by [employer]bridge.                                 Contains text generated by [employer]bridge.

## 2024-04-23 ENCOUNTER — Other Ambulatory Visit: Payer: Self-pay | Admitting: Neurology

## 2024-04-23 ENCOUNTER — Telehealth: Payer: Self-pay | Admitting: Family Medicine

## 2024-04-23 DIAGNOSIS — G250 Essential tremor: Secondary | ICD-10-CM

## 2024-04-23 NOTE — Telephone Encounter (Signed)
 Copied from CRM [id number]. Topic: Clinical - Medication Refill >> Apr 23, 2024  9:35 AM Rosaria A wrote: Medication: clonazePAM  (KLONOPIN ) 0.5 MG tablet  Has the patient contacted their pharmacy? No  Patient states that she was going to Dr. Evonnie (Neurology) in [place]. Due to her health declining, driving to [place] has become too much. Patient states that she is not going back to Dr. Evonnie. Patient states Dr. Evonnie was sending over notes about her medication to Dr. Alvan so that she can start prescribing the medication.   This is the patient's preferred pharmacy:  [phone] [place] [place] - [address] [place] [place]  [place] [address] Phone: [phone] Fax: [phone]  Is this the correct pharmacy for this prescription? Yes   Has the prescription been filled recently? No  Is the patient out of the medication? Yes Patient is wanting to see if the medication can be sent to the Pharmacy today.  Has the patient been seen for an appointment in the last year OR does the patient have an upcoming appointment? Yes  Can we respond through MyChart? No  Agent: Please be advised that Rx refills may take up to 3 business days. We ask that you follow-up with your pharmacy.

## 2024-04-25 ENCOUNTER — Other Ambulatory Visit: Payer: Self-pay | Admitting: *Deleted

## 2024-04-25 ENCOUNTER — Telehealth: Payer: Self-pay | Admitting: Neurology

## 2024-04-25 ENCOUNTER — Telehealth: Payer: Self-pay

## 2024-04-25 ENCOUNTER — Ambulatory Visit: Payer: Self-pay

## 2024-04-25 DIAGNOSIS — G250 Essential tremor: Secondary | ICD-10-CM

## 2024-04-25 NOTE — Telephone Encounter (Signed)
 Since just seen by Neurology I prefer they resent Rx and in 6 months I can look at taking over Rx if pt prefers but she and I can discuss at our next f/u

## 2024-04-25 NOTE — Telephone Encounter (Signed)
 This was last written by Dr. Evonnie pt last filled this on 03/22/2024 #45 for 30 day supply.   Will fwd to pcp for review

## 2024-04-25 NOTE — Telephone Encounter (Signed)
 Rx refill was sent but failed when rcvd by pharmacy, please resend refill  scrpt for Rx clonazePAM  (KLONOPIN ) 0.5 MG tablet  [facility] [address] [facility]Out [facility]

## 2024-04-25 NOTE — Telephone Encounter (Signed)
 FYI Only or Action Required?: FYI only for provider: see triage note.  Patient was last seen in primary care on 02/20/2024 by Alvan Dorothyann BIRCH, MD.  Called Nurse Triage reporting Medication Refill and Tremors.  Symptoms began several days ago.  Interventions attempted: Nothing.  Symptoms are: gradually worsening.  Triage Disposition: Call Specialist Now (overriding Call PCP Now)  Patient/caregiver understands and will follow disposition?: Yes          Copied from CRM #[id number]. Topic: Clinical - Red Word Triage >> Apr 25, 2024  1:21 PM Yolanda T wrote: [facility] that prompted transfer to Nurse Triage: patient called said she has the tremors and is having a hard time today. She takes clonazePAM  (KLONOPIN ) 0.5 MG tablet for the symptoms. She has not had her medication since Sunday night. Her tremors has gotten worse and she is shaking all over. Reason for Disposition  [1] Prescription not at pharmacy AND [2] was prescribed by doctor (or NP/PA) recently  (Exception: [facility] has access to EMR and prescription is recorded there. Go to Home Care and confirm prescription for pharmacy.)  Answer Assessment - Initial Assessment Questions 1. DRUG NAME: What medicine do you need to have refilled?     clonazepam  2. REFILLS REMAINING: How many refills are remaining? Notes: The label on the medicine or pill bottle will show how many refills are remaining. If there are no refills remaining, then a renewal may be needed.     4 3. EXPIRATION DATE: What is the expiration date? Note: The label states when the prescription will expire, and thus can no longer be refilled.)     10/21/24 4. PRESCRIBER: Who prescribed it? Note: The prescribing doctor or group is responsible for refill approvals..     Dr Tat, neurology. RN advised patient to call neurology office to confirm the e prescribing transmission went thru to the pharmacy. She states she will no longer be going to the neurologist as she  does not feel she should drive to [place] anymore due to her age. She would like Dr Alvan to take over the prescription.  5. PHARMACY: Have you contacted your pharmacy (drugstore)? Note: Some pharmacies will contact the doctor (or NP/PA).      She states she called her pharmacy and they did not receive the medication.  6. SYMPTOMS: Do you have any symptoms?     Patient states she experiences whole body tremors when she does not take her clonazepam . She states even when on the medication it is still present but confined to just tremors in her hands. She states as of last night also experiencing nausea. Last dose of medication was Sunday night.  Protocols used: Medication Refill and Renewal Call-A-AH

## 2024-04-25 NOTE — Telephone Encounter (Signed)
 Patient gets this medication from her neurologists. It was refilled yesterday 45 tab w/ 4 refills

## 2024-04-26 ENCOUNTER — Telehealth: Payer: Self-pay

## 2024-04-26 MED ORDER — CLONAZEPAM 0.5 MG PO TABS: ORAL_TABLET | ORAL | 4 refills | Status: AC

## 2024-04-26 NOTE — Telephone Encounter (Signed)
 Spoke with patient. She would like to see if Dr. Alvan would be willing to start prescribing her Primidone  50mg   that was last written by Dr. Evonnie for her.  She is  not in need of refills now but wanted to see if in the future she would be willing to take over filling this prescription for her.

## 2024-04-26 NOTE — Telephone Encounter (Signed)
 Patient informed.

## 2024-05-03 ENCOUNTER — Other Ambulatory Visit: Payer: Self-pay | Admitting: Neurology

## 2024-06-06 ENCOUNTER — Other Ambulatory Visit: Payer: Self-pay | Admitting: Family Medicine
